# Patient Record
Sex: Female | Born: 1935
Health system: Southern US, Community
[De-identification: ages and names within clinical notes are randomized; demographics above are authoritative.]

## PROBLEM LIST (undated history)

## (undated) DIAGNOSIS — D509 Iron deficiency anemia, unspecified: Secondary | ICD-10-CM

## (undated) DIAGNOSIS — R42 Dizziness and giddiness: Secondary | ICD-10-CM

## (undated) DIAGNOSIS — G629 Polyneuropathy, unspecified: Secondary | ICD-10-CM

## (undated) DIAGNOSIS — E079 Disorder of thyroid, unspecified: Secondary | ICD-10-CM

## (undated) DIAGNOSIS — F418 Other specified anxiety disorders: Secondary | ICD-10-CM

## (undated) DIAGNOSIS — E559 Vitamin D deficiency, unspecified: Secondary | ICD-10-CM

## (undated) DIAGNOSIS — C801 Malignant (primary) neoplasm, unspecified: Secondary | ICD-10-CM

## (undated) DIAGNOSIS — E039 Hypothyroidism, unspecified: Secondary | ICD-10-CM

## (undated) DIAGNOSIS — R413 Other amnesia: Secondary | ICD-10-CM

## (undated) DIAGNOSIS — I456 Pre-excitation syndrome: Secondary | ICD-10-CM

## (undated) DIAGNOSIS — S82009A Unspecified fracture of unspecified patella, initial encounter for closed fracture: Secondary | ICD-10-CM

## (undated) DIAGNOSIS — J349 Unspecified disorder of nose and nasal sinuses: Secondary | ICD-10-CM

## (undated) DIAGNOSIS — N289 Disorder of kidney and ureter, unspecified: Secondary | ICD-10-CM

## (undated) DIAGNOSIS — I7300 Raynaud's syndrome without gangrene: Secondary | ICD-10-CM

## (undated) DIAGNOSIS — I10 Essential (primary) hypertension: Secondary | ICD-10-CM

## (undated) DIAGNOSIS — M1990 Unspecified osteoarthritis, unspecified site: Secondary | ICD-10-CM

## (undated) DIAGNOSIS — B259 Cytomegaloviral disease, unspecified: Secondary | ICD-10-CM

## (undated) DIAGNOSIS — M8580 Other specified disorders of bone density and structure, unspecified site: Secondary | ICD-10-CM

## (undated) DIAGNOSIS — M109 Gout, unspecified: Secondary | ICD-10-CM

## (undated) DIAGNOSIS — Z853 Personal history of malignant neoplasm of breast: Secondary | ICD-10-CM

## (undated) HISTORY — PX: BREAST SURGERY: SHX581

## (undated) HISTORY — PX: ABDOMINAL HYSTERECTOMY: SHX81

## (undated) HISTORY — DX: Iron deficiency anemia, unspecified: D50.9

## (undated) HISTORY — DX: Vitamin D deficiency, unspecified: E55.9

## (undated) HISTORY — DX: Disorder of thyroid, unspecified: E07.9

## (undated) HISTORY — DX: Essential (primary) hypertension: I10

## (undated) HISTORY — DX: Cytomegaloviral disease, unspecified: B25.9

## (undated) HISTORY — PX: SINUS SURGERY WITH INSTATRAK: SHX5215

## (undated) HISTORY — DX: Other specified disorders of bone density and structure, unspecified site: M85.80

## (undated) HISTORY — DX: Personal history of malignant neoplasm of breast: Z85.3

## (undated) HISTORY — DX: Unspecified fracture of unspecified patella, initial encounter for closed fracture: S82.009A

## (undated) HISTORY — DX: Raynaud's syndrome without gangrene: I73.00

## (undated) HISTORY — DX: Polyneuropathy, unspecified: G62.9

## (undated) HISTORY — PX: JOINT REPLACEMENT: SHX530

## (undated) HISTORY — DX: Other specified anxiety disorders: F41.8

## (undated) HISTORY — DX: Unspecified osteoarthritis, unspecified site: M19.90

## (undated) HISTORY — DX: Dizziness and giddiness: R42

## (undated) HISTORY — DX: Other amnesia: R41.3

## (undated) HISTORY — DX: Pre-excitation syndrome: I45.6

## (undated) HISTORY — DX: Gout, unspecified: M10.9

---

## 1986-04-17 DIAGNOSIS — C801 Malignant (primary) neoplasm, unspecified: Secondary | ICD-10-CM

## 1986-04-17 HISTORY — DX: Malignant (primary) neoplasm, unspecified: C80.1

## 1998-02-12 ENCOUNTER — Other Ambulatory Visit: Admission: RE | Admit: 1998-02-12 | Discharge: 1998-02-12 | Payer: Self-pay | Admitting: *Deleted

## 1998-09-08 ENCOUNTER — Ambulatory Visit (HOSPITAL_COMMUNITY): Admission: RE | Admit: 1998-09-08 | Discharge: 1998-09-08 | Payer: Self-pay | Admitting: Obstetrics & Gynecology

## 1998-09-14 ENCOUNTER — Ambulatory Visit (HOSPITAL_COMMUNITY): Admission: RE | Admit: 1998-09-14 | Discharge: 1998-09-14 | Payer: Self-pay | Admitting: *Deleted

## 1998-09-14 ENCOUNTER — Encounter: Payer: Self-pay | Admitting: *Deleted

## 1999-12-14 ENCOUNTER — Encounter: Payer: Self-pay | Admitting: *Deleted

## 1999-12-14 ENCOUNTER — Ambulatory Visit (HOSPITAL_COMMUNITY): Admission: RE | Admit: 1999-12-14 | Discharge: 1999-12-14 | Payer: Self-pay | Admitting: *Deleted

## 2000-01-10 ENCOUNTER — Ambulatory Visit (HOSPITAL_COMMUNITY): Admission: RE | Admit: 2000-01-10 | Discharge: 2000-01-10 | Payer: Self-pay | Admitting: *Deleted

## 2000-10-29 ENCOUNTER — Other Ambulatory Visit: Admission: RE | Admit: 2000-10-29 | Discharge: 2000-10-29 | Payer: Self-pay | Admitting: *Deleted

## 2000-12-21 ENCOUNTER — Encounter: Payer: Self-pay | Admitting: *Deleted

## 2000-12-21 ENCOUNTER — Ambulatory Visit (HOSPITAL_COMMUNITY): Admission: RE | Admit: 2000-12-21 | Discharge: 2000-12-21 | Payer: Self-pay | Admitting: *Deleted

## 2001-08-13 ENCOUNTER — Encounter: Admission: RE | Admit: 2001-08-13 | Discharge: 2001-08-13 | Payer: Self-pay | Admitting: Internal Medicine

## 2001-08-13 ENCOUNTER — Encounter: Payer: Self-pay | Admitting: Internal Medicine

## 2001-12-23 ENCOUNTER — Encounter: Payer: Self-pay | Admitting: *Deleted

## 2001-12-23 ENCOUNTER — Ambulatory Visit (HOSPITAL_COMMUNITY): Admission: RE | Admit: 2001-12-23 | Discharge: 2001-12-23 | Payer: Self-pay | Admitting: *Deleted

## 2002-06-06 ENCOUNTER — Encounter: Admission: RE | Admit: 2002-06-06 | Discharge: 2002-06-18 | Payer: Self-pay | Admitting: Internal Medicine

## 2002-12-25 ENCOUNTER — Encounter: Payer: Self-pay | Admitting: *Deleted

## 2002-12-25 ENCOUNTER — Ambulatory Visit (HOSPITAL_COMMUNITY): Admission: RE | Admit: 2002-12-25 | Discharge: 2002-12-25 | Payer: Self-pay | Admitting: *Deleted

## 2003-07-06 ENCOUNTER — Encounter: Admission: RE | Admit: 2003-07-06 | Discharge: 2003-07-06 | Payer: Self-pay | Admitting: Internal Medicine

## 2003-12-15 ENCOUNTER — Emergency Department (HOSPITAL_COMMUNITY): Admission: EM | Admit: 2003-12-15 | Discharge: 2003-12-15 | Payer: Self-pay | Admitting: Emergency Medicine

## 2004-02-17 ENCOUNTER — Emergency Department (HOSPITAL_COMMUNITY): Admission: EM | Admit: 2004-02-17 | Discharge: 2004-02-17 | Payer: Self-pay | Admitting: Emergency Medicine

## 2004-03-30 ENCOUNTER — Encounter: Admission: RE | Admit: 2004-03-30 | Discharge: 2004-03-30 | Payer: Self-pay | Admitting: Internal Medicine

## 2005-03-01 ENCOUNTER — Encounter: Admission: RE | Admit: 2005-03-01 | Discharge: 2005-03-01 | Payer: Self-pay | Admitting: Orthopedic Surgery

## 2005-03-03 ENCOUNTER — Ambulatory Visit (HOSPITAL_BASED_OUTPATIENT_CLINIC_OR_DEPARTMENT_OTHER): Admission: RE | Admit: 2005-03-03 | Discharge: 2005-03-03 | Payer: Self-pay | Admitting: Orthopedic Surgery

## 2005-03-03 ENCOUNTER — Ambulatory Visit (HOSPITAL_COMMUNITY): Admission: RE | Admit: 2005-03-03 | Discharge: 2005-03-03 | Payer: Self-pay | Admitting: Orthopedic Surgery

## 2005-05-23 ENCOUNTER — Encounter: Admission: RE | Admit: 2005-05-23 | Discharge: 2005-05-23 | Payer: Self-pay | Admitting: Internal Medicine

## 2005-08-22 ENCOUNTER — Ambulatory Visit (HOSPITAL_COMMUNITY): Admission: RE | Admit: 2005-08-22 | Discharge: 2005-08-22 | Payer: Self-pay | Admitting: *Deleted

## 2005-09-18 ENCOUNTER — Encounter: Admission: RE | Admit: 2005-09-18 | Discharge: 2005-09-18 | Payer: Self-pay | Admitting: Internal Medicine

## 2006-05-25 ENCOUNTER — Encounter: Admission: RE | Admit: 2006-05-25 | Discharge: 2006-05-25 | Payer: Self-pay | Admitting: Internal Medicine

## 2006-06-26 ENCOUNTER — Encounter: Admission: RE | Admit: 2006-06-26 | Discharge: 2006-06-26 | Payer: Self-pay | Admitting: Internal Medicine

## 2007-07-04 ENCOUNTER — Encounter: Admission: RE | Admit: 2007-07-04 | Discharge: 2007-07-04 | Payer: Self-pay | Admitting: Internal Medicine

## 2007-12-16 ENCOUNTER — Inpatient Hospital Stay (HOSPITAL_COMMUNITY): Admission: RE | Admit: 2007-12-16 | Discharge: 2007-12-19 | Payer: Self-pay | Admitting: Orthopedic Surgery

## 2008-02-18 ENCOUNTER — Encounter (HOSPITAL_COMMUNITY): Admission: RE | Admit: 2008-02-18 | Discharge: 2008-04-16 | Payer: Self-pay | Admitting: Nephrology

## 2008-07-06 ENCOUNTER — Encounter: Admission: RE | Admit: 2008-07-06 | Discharge: 2008-07-06 | Payer: Self-pay | Admitting: Internal Medicine

## 2009-06-14 ENCOUNTER — Other Ambulatory Visit: Admission: RE | Admit: 2009-06-14 | Discharge: 2009-06-14 | Payer: Self-pay | Admitting: Internal Medicine

## 2009-07-07 ENCOUNTER — Encounter: Admission: RE | Admit: 2009-07-07 | Discharge: 2009-07-07 | Payer: Self-pay | Admitting: Internal Medicine

## 2010-04-01 ENCOUNTER — Encounter (HOSPITAL_COMMUNITY)
Admission: RE | Admit: 2010-04-01 | Discharge: 2010-05-17 | Payer: Self-pay | Source: Home / Self Care | Attending: Nephrology | Admitting: Nephrology

## 2010-04-20 LAB — POCT HEMOGLOBIN-HEMACUE: Hemoglobin: 10.9 g/dL — ABNORMAL LOW (ref 12.0–15.0)

## 2010-05-09 LAB — IRON AND TIBC
Iron: 102 ug/dL (ref 42–135)
Saturation Ratios: 43 % (ref 20–55)
TIBC: 240 ug/dL — ABNORMAL LOW (ref 250–470)
UIBC: 138 ug/dL

## 2010-05-09 LAB — POCT HEMOGLOBIN-HEMACUE: Hemoglobin: 11.8 g/dL — ABNORMAL LOW (ref 12.0–15.0)

## 2010-05-09 LAB — FERRITIN: Ferritin: 936 ng/mL — ABNORMAL HIGH (ref 10–291)

## 2010-05-17 LAB — POCT HEMOGLOBIN-HEMACUE: Hemoglobin: 11.7 g/dL — ABNORMAL LOW (ref 12.0–15.0)

## 2010-05-30 ENCOUNTER — Encounter (HOSPITAL_COMMUNITY): Payer: Self-pay

## 2010-05-31 ENCOUNTER — Encounter (HOSPITAL_COMMUNITY): Payer: Medicare Other | Attending: Nephrology

## 2010-05-31 ENCOUNTER — Other Ambulatory Visit: Payer: Self-pay

## 2010-05-31 ENCOUNTER — Other Ambulatory Visit: Payer: Self-pay | Admitting: Nephrology

## 2010-05-31 DIAGNOSIS — D638 Anemia in other chronic diseases classified elsewhere: Secondary | ICD-10-CM | POA: Insufficient documentation

## 2010-05-31 DIAGNOSIS — N184 Chronic kidney disease, stage 4 (severe): Secondary | ICD-10-CM | POA: Insufficient documentation

## 2010-05-31 LAB — IRON AND TIBC
Iron: 105 ug/dL (ref 42–135)
Saturation Ratios: 46 % (ref 20–55)
TIBC: 229 ug/dL — ABNORMAL LOW (ref 250–470)
UIBC: 124 ug/dL

## 2010-06-01 LAB — POCT HEMOGLOBIN-HEMACUE: Hemoglobin: 12.3 g/dL (ref 12.0–15.0)

## 2010-06-13 ENCOUNTER — Other Ambulatory Visit: Payer: Self-pay | Admitting: Internal Medicine

## 2010-06-13 DIAGNOSIS — Z1231 Encounter for screening mammogram for malignant neoplasm of breast: Secondary | ICD-10-CM

## 2010-06-13 DIAGNOSIS — Z9010 Acquired absence of unspecified breast and nipple: Secondary | ICD-10-CM

## 2010-06-14 ENCOUNTER — Encounter (HOSPITAL_COMMUNITY): Payer: Medicare Other

## 2010-06-16 HISTORY — PX: KIDNEY TRANSPLANT: SHX239

## 2010-07-11 ENCOUNTER — Ambulatory Visit: Payer: Medicare Other

## 2010-07-19 ENCOUNTER — Ambulatory Visit
Admission: RE | Admit: 2010-07-19 | Discharge: 2010-07-19 | Disposition: A | Discharge status: Home / Self Care | Payer: Medicare Other | Source: Ambulatory Visit | Attending: Internal Medicine | Admitting: Internal Medicine

## 2010-07-19 DIAGNOSIS — Z1231 Encounter for screening mammogram for malignant neoplasm of breast: Secondary | ICD-10-CM

## 2010-07-19 DIAGNOSIS — Z9010 Acquired absence of unspecified breast and nipple: Secondary | ICD-10-CM

## 2010-08-30 NOTE — Op Note (Signed)
NAMETYJA, MONTAS              ACCOUNT NO.:  [id number]   MEDICAL RECORD NO.:  [id number]          PATIENT TYPE:  INP   LOCATION:  30                         FACILITY:  [facility]   PHYSICIAN:  Gaynelle Arabian, M.D.    DATE OF BIRTH:  12/06/1931   DATE OF PROCEDURE:  12/16/2007  DATE OF DISCHARGE:                               OPERATIVE REPORT   PREOPERATIVE DIAGNOSIS:  Osteoarthritis left knee.   POSTOPERATIVE DIAGNOSIS:  Osteoarthritis left knee.   PROCEDURE:  Left total knee arthroplasty.   SURGEON:  Gaynelle Arabian, MD.   ASSISTANT:  Lorretta Harp, PA-C.   ANESTHESIA:  Spinal with Duramorph.   ESTIMATED BLOOD LOSS:  Minimal.   DRAIN:  None.   TOURNIQUET TIME:  30 minutes at 300 mmHg.   COMPLICATIONS:  None.   CONDITION:  Stable to recovery.   BRIEF OPERATIVE NOTE:  Ms. Sieker is a 77 year old female with end-  stage arthritis of the left knee with progressively worsening pain and  dysfunction.  She has failed nonoperative management and presents for  total knee arthroplasty.   PROCEDURE IN DETAIL:  After the successful administration of spinal  anesthetic, a tourniquet was placed high on the left thigh and the left  lower extremity was prepped and draped in the usual sterile fashion.  The extremity was wrapped in Esmarch, knee flexed, tourniquet inflated  to 300 mmHg.  A midline incision was made with 10-blade through the  subcutaneous tissue to the level of the extensor mechanism.  A fresh  blade was used to make a medial parapatellar arthrotomy.  The soft  tissue over the proximal medial tibia subperiosteally elevated to joint  line with a knife and semimembranosus bursa with a Cobb elevator.  Soft  tissue laterally was elevated with attention being paid to avoid the  patellar tendon on the tibial tubercle.  Patella subluxed laterally,  knee flexed 90 degrees, ACL and PCL removed.  She had severe erosion of  her lateral femoral condyle with bone-on-bone change in  the lateral  compartment.  A drill was used to create a starting hole in the distal  femur and the canal was thoroughly irrigated.  The 5 degree left valgus  alignment guide is placed and referencing off the posterior condyles  rotations marked and a block pinned to remove 11 mm of the distal femur.  I took 11 because of preop flexion contracture.  A distal femoral  resection is made with an oscillating saw.  A sizing block is placed, a  size 4 was most appropriate in the AP plane and 3 is most appropriate in  the mediolateral plane, thus we chose a 4 narrow for the femoral size.  A size 4 cutting block is placed with the rotation marked at the  epicondylar axis.  The anterior, posterior and chamfer cuts are made.   The tibia subluxed forward and menisci are removed.  Extramedullary  tibial alignment guide is placed referencing proximally at the medial  aspect of the tibial tubercle and distally along the second metatarsal  axis and tibial crest.  Block is pinned to remove  4 mm off the more  deficient lateral side.  Tibial resection is made an oscillating saw.  A  size 3 is the most appropriate tibial component and the proximal tibia  is prepared with a modular drill and keel punch for a size 3.  Femoral  preparation is completed with the intercondylar cut for the size 4.   A size 3 mobile bearing tibial trial, size 4 narrow posterior stabilized  femoral trial, and a 10-mm posterior stabilized rotating platform insert  trial are placed.  With a 10, full extension is achieved with excellent  varus-valgus anterior and posterior balance throughout full range of  motion.  Patella was everted and thickness measured to be 22 mm.  Freehand resection was taken at 13 mm, a 38 template is placed, lug  holes were drilled, trial patella was placed and it tracks normally.  Osteophytes removed off the posterior femur with the trial in place.  All trials are removed and the cut bone surfaces are  prepared with  pulsatile lavage.  Cement is mixed and once we are at implantation a  size 3 mobile bearing tibial tray, size 4 posterior stabilized femur,  and 38 patella are cemented into place and the patella was held with the  clamp.  Trial 10-mm inserts placed, knee held in full extension and all  extruded cement removed.  Once cement was fully hardened then the  permanent 10 mm posterior stabilized rotating platform insert is placed  into the tibial tray.  The wound was copiously irrigated with saline  solution.  FloSeal was injected on the posterior capsule, medial and  lateral gutters and suprapatellar area.  A moist sponge is placed and  tourniquet released for a total time of 30 minutes.  The sponge is held  for 2 minutes then removed.  Minimal bleeding is encountered.  That  which is encountered is stopped with electrocautery.  The wound is again  copiously irrigated with saline solution and the arthrotomy closed with  interrupted #1 PDS.  Flexion against gravity is 135 degrees.  Subcu is  closed with interrupted #2-0 Vicryl and subcuticular running #4-0  Monocryl.  The incision is cleaned and dried and Steri-Strips and a  bulky sterile dressing applied.  She is then placed into a knee  immobilizer, awakened and transported to recovery in stable condition.      Gaynelle Arabian, M.D.  Electronically Signed     FA/MEDQ  D:  12/16/2007  T:  12/16/2007  Job:  [id number]

## 2010-09-02 NOTE — Procedures (Signed)
[place]  Patient:    [name], [name]                       MRN: [id number] Proc. Date: 01/10/00 Adm. Date:  [id number] Attending:  Jim Desanctis                           Procedure Report  PROCEDURE: Colonoscopy.  ENDOSCOPIST: Jim Desanctis, M.D.  INDICATIONS FOR PROCEDURE: Hemoccult positivity.  ANESTHESIA: Demerol 60 mg, Versed 6 mg.  DESCRIPTION OF PROCEDURE: With the patient mildly sedated and in the left lateral decubitus position the Olympus videoscopic variable stiffness pediatric colonoscope was inserted into the rectum and passed under direct vision to the cecum.  The cecum was identified by transillumination of the right lower quadrant, by visualization of the ileocecal valve and appendiceal orifice, both of which were photographed.  From this point the colonoscope was slowly withdrawn.  Taking circumferential views of the entire colonic mucosa, stopping only in the rectum, which appeared normal, and direct view showing internal hemorrhoids on retroflex view.  The endoscope was straightened and withdrawn.  The patients vital signs and pulse oximetry remained stable.  The patient tolerated the procedure well without apparent complications.  FINDINGS: Internal hemorrhoids, otherwise unremarkable colonoscopic examination to the cecum.  PLAN: Repeat examination in five years. DD:  01/10/00 TD:  01/11/00 Job: [id number]

## 2010-09-02 NOTE — Op Note (Signed)
[name], [name]              ACCOUNT NO.:  [id number]   MEDICAL RECORD NO.:  [id number]          PATIENT TYPE:  AMB   LOCATION:  ENDO                         FACILITY:  [facility]   PHYSICIAN:  Waverly Ferrari, M.D.    DATE OF BIRTH:  1931-07-29   DATE OF PROCEDURE:  08/22/2005  DATE OF DISCHARGE:                                 OPERATIVE REPORT   PROCEDURE:  Colonoscopy.   INDICATIONS:  Rectal bleeding.   ANESTHESIA:  Demerol 80, Versed 8 mg.   PROCEDURE:  With the patient mildly sedated in the left lateral decubitus  position the Olympus videoscopic colonoscope was inserted the rectum and  passed under direct vision to the cecum identified by ileocecal valve and  crow's foot of the cecum.  From this point the colonoscope was slowly  withdrawn taking circumferential views of colonic mucosa stopping in the  rectum which appeared normal on direct and showed hemorrhoids on retroflexed  view.  The endoscope was straightened and withdrawn.  The patient's vital  signs, pulse oximeter remained stable.  The patient tolerated procedure well  without apparent complications.   FINDINGS:  Internal hemorrhoids, otherwise unremarkable colonoscopic  examination to the cecum.   PLAN:  Repeat examination in 5-10 years.           ______________________________  Waverly Ferrari, M.D.     GMO/MEDQ  D:  08/22/2005  T:  08/23/2005  Job:  [id number]

## 2010-09-02 NOTE — H&P (Signed)
[name], [name]              ACCOUNT NO.:  [id number]   MEDICAL RECORD NO.:  [id number]          PATIENT TYPE:  INP   LOCATION:                               FAC[facility]   PHYSICIAN:  Gaynelle Arabian, M.D.    DATE OF BIRTH:  1932/04/16   DATE OF ADMISSION:  12/16/2007  DATE OF DISCHARGE:                              HISTORY & PHYSICAL   CHIEF COMPLAINT:  Left knee pain.   HISTORY OF PRESENT ILLNESS:  The patient is a 77 year old female who has  been seen by Dr. Wynelle Link for worsening pain and dysfunction of her knee.  She has had arthroscopy done by Dr. Shellia Carwin 10 years ago and noted to  have arthritis at that time.  She has undergone hylan injections;  despite previous scope and also injections she continued to have pain  and felt to be a good candidate.  Risks and benefits have been  discussed, she elects to proceed with surgery.   ALLERGIES:  KEFLEX.   CURRENT MEDICATIONS:  Tramadol, felodipine, Synthroid, vitamin D,  calcium, iron.   PAST MEDICAL HISTORY:  Vertigo, Wolff-Parkinson-White arrhythmia,  hypertension, hemorrhoids, chronic kidney disease (the patient states  she is functioning about 20%), hypothyroidism, history of anemia, breast  cancer, osteopenia, history of gout, postmenopausal.   PAST SURGICAL HISTORY:  Hysterectomy, right mastectomy for cancer, sinus  surgery.   FAMILY HISTORY:  Father with history of dementia, deceased at 39.  Mother deceased age 39 with cerebral hemorrhage.  Brother with  emphysema.   SOCIAL HISTORY:  Married, retired, nonsmoker, seldom intake of alcohol.  Husband will be assisting with care after surgery.   REVIEW OF SYSTEMS:  GENERAL:  No fevers, chills or night sweats.  NEUROLOGICAL:  No seizures, syncope or paralysis.  RESPIRATORY:  No  shortness of breath, productive cough or hemoptysis.  CARDIOVASCULAR:  No chest pain, PND, orthopnea.  GI:  No nausea, vomiting, diarrhea or  constipation.  GU:  History of chronic renal  disease.  MUSCULOSKELETAL:  Left knee.   PHYSICAL EXAMINATION:  VITAL SIGNS:  Pulse 68, respirations 12, blood  pressure 122/68.  GENERAL:  A 77 year old white female, thin frame, alert, oriented,  cooperative and pleasant, excellent historian, accompanied by her  husband.  HEENT:  Normocephalic, atraumatic.  Pupils round and reactive.  Oropharynx clear.  EOMs intact.  NECK:  Supple.  No bruits.  CHEST:  Clear.  HEART:  Regular rate and rhythm without murmur.  ABDOMEN:  Soft, nontender, flat, bowel sounds present.  RECTAL:  Not done, not pertinent to present illness.  BREASTS:  Not done, not pertinent to present illness.  GENITALIA:  Not done, not pertinent to present illness.  EXTREMITIES:  Left knee slight effusion, marked crepitus, tender more  lateral than medial.   IMPRESSION:  Osteoarthritis left knee.   PLAN:  The patient admitted at [facility] to undergo a left  total knee replacement arthroplasty.  Surgery will be performed by Dr.  Gaynelle Arabian.      Alexzandrew L. Perkins, P.A.C.      Gaynelle Arabian, M.D.  Electronically Signed  ALP/MEDQ  D:  12/15/2007  T:  12/15/2007  Job:  [id number]

## 2010-09-02 NOTE — Discharge Summary (Signed)
NAMEANYIA, LAVALAIS              ACCOUNT NO.:  [id number]   MEDICAL RECORD NO.:  [id number]          PATIENT TYPE:  INP   LOCATION:  11                         FACILITY:  [facility]   PHYSICIAN:  Gaynelle Arabian, M.D.    DATE OF BIRTH:  [birth year]   DATE OF ADMISSION:  12/16/2007  DATE OF DISCHARGE:  12/19/2007                               DISCHARGE SUMMARY   ADMITTING DIAGNOSES:  1. Osteoarthritis left knee.  2. Vertigo.  3. Wolff-Parkinson-White arrhythmia.  4. Hypertension.  5. Hemorrhoids.  6. Chronic kidney disease.  7. Hypothyroidism.  8. History of anemia.  9. Breast cancer.  10.Osteopenia.  11.History of gout.  12.Postmenopausal.   DISCHARGE DIAGNOSES:  1. Osteoarthritis left knee status post left total knee replacement      arthroplasty.  2. Hyperkalemia postoperative, improved.  3. Hyponatremia, mild, improved.  4. Vertigo.  5. Wolff-Parkinson-White arrhythmia.  6. Hypertension.  7. Hemorrhoids.  8. Chronic kidney disease.  9. Hypothyroidism.  10.History of anemia.  11.Breast cancer.  12.Osteopenia.  13.History of gout.  14.Postmenopausal.   PROCEDURE:  December 16, 2007 - left total knee.  Surgeon Dr. Wynelle Link.  Assisted Rutherford Limerick, PA-C.  Anesthesia spinal with Duramorph.   CONSULTATIONS:  None, although renal services were notified of the  patient's admission.   BRIEF HISTORY:  Ms. Burst is a 77 year old female with end-stage  arthritis of left knee, progressive worsening pain and dysfunction, who  failed outpatient management and now presents for a total knee  arthroplasty.   LABORATORY DATA:  Preoperative CBC showed a hemoglobin of 12.4,  hematocrit 37.4, white cell count 6.0, red cell count 4.02, platelets  228.  Postoperative hemoglobin 9.4 drifting down to 9.3 where it  stabilized.  Last noted hemoglobin and hematocrit of 9.3 and 27.4.  PT/PTT on admission 12.9 and 29 respectively.  INR of 1.0.  Serial pro  times followed.  Last noted PT/INR 25.9  and 2.2.  Chem panel on  admission revealed high BUN of 52, creatinine of 2.76 (known renal  disease).  Remaining chem panel within normal limits.  Serial BMETs were  followed.  Potassium went from a normal of 4.1 up to 5.5, back down to  4.6, last noted at 4.8.  Sodium went down a little bit from 136 to 134,  back up to 140.  BUN went up from 52 to 55, back down to 39, lower than  preoperative level,  Creatinine started at 2.76, went up to 3.2, back  down to 2.89.  Preoperative UA - positive protein, moderate leukocyte  esterase, only 3-6 white cells.   X-RAYS:  Chest x-ray on December 09, 2007 revealed no active disease.   HOSPITAL COURSE:  The patient was admitted to [facility] and  tolerated the procedure well.  Later transferred to the recovery room  and the orthopedic floor.  Started on PCA and p.o. analgesic pain  control following surgery.  Home medications were renewed.  Did have a  history of anemia.  Hemoglobin was down to 9.4.  Started on iron.  She  had known renal disease, so creatinine started  high; it did go up a  little bit on postoperative day #1 up to 3.  Potassium was elevated  postoperatively.  Had a normal preoperative level.  Blood pressure was  okay.  Held blood pressure medications for any low pressures. Had decent  output, and we monitored her I&O's very strictly.  Started getting up  out of bed.  By day two, she was doing better.  Potassium had improved.  We did notify renal services postoperatively after her surgery.  By day  two, dressing was changed, incision looked good.  Hemoglobin was down to  9.3 where it stabilized.  BUN and creatinine did go up a little bit  initially but started titrating down, back down closer to preoperative  levels.  Had excellent urinary output by day two.  The incision looked  good.  She was walking well with therapy, up about 80 feet, continued to  improve, and by day three she was tolerating her medications,  hemoglobin  was 93 where it stabilized, and she was discharged home.   DISCHARGE PLAN:  1. The patient was discharged home on December 19, 2007.  2. Discharge diagnoses - please see above.  3. Discharge medications - Vicodin, Coumadin, Robaxin.  4. Diet renal, heart-healthy diet.  5. Follow-up 2 weeks.  6. Activity - weightbearing as tolerated, home health PT, home health      nursing.   DISPOSITION:  Home.   CONDITION ON DISCHARGE:  Improving.      Alexzandrew L. Perkins, P.A.C.      Gaynelle Arabian, M.D.  Electronically Signed    ALP/MEDQ  D:  01/09/2008  T:  01/09/2008  Job:  [id number]

## 2010-09-02 NOTE — Op Note (Signed)
NAMEJANILLE, [name]              ACCOUNT NO.:  [id number]   MEDICAL RECORD NO.:  [id number]          PATIENT TYPE:  AMB   LOCATION:  NESC                         FACILITY:  [facility]   PHYSICIAN:  Tarri Glenn, M.D.  DATE OF BIRTH:  [birth year]   DATE OF PROCEDURE:  03/03/2005  DATE OF DISCHARGE:                                 OPERATIVE REPORT   PREOPERATIVE DIAGNOSIS:  Lateral compartment degenerative arthritis and torn  lateral meniscus, left knee.   POSTOPERATIVE DIAGNOSES:  1.  Lateral compartment degenerative arthritis and torn lateral meniscus,      left knee.  2.  Torn medial meniscus.  3.  Chondromalacia of the patella.   OPERATION:  Left knee arthroscopy with 1) partial medial and lateral  meniscectomies, 2) shaving of the patella.   SURGEON:  Tarri Glenn, M.D.   ASSISTANT:  Nurse.   ANESTHESIA:  General.   PATHOLOGY AND JUSTIFICATION FOR PROCEDURE:  She has had progressive pain  mainly in the lateral aspect of her left knee. Plain x-rays have  demonstrated narrowing of the lateral compartment of the joint. MRI has  demonstrated diagnosis #1. She and her husband understand that today's  surgery is designed to correct the torn lateral meniscus problem but may  very well not correct her complete knee complaints.   DESCRIPTION OF PROCEDURE:  Satisfactory general anesthesia, pneumatic  tourniquet, leg was esmarched out nonsterilely. Thigh stabilizer, leg was  prepped from stabilizer to ankle with DuraPrep, draped in a sterile field.  Ace wrap and knee support to the right leg, superior and medial saline  inflow. First through  an anterior lateral portal, the medial compartment of  the knee joint was evaluated. I found a superficial tear of the medial  meniscus which was pictured and shaved down until smooth with a 3.5 shaver.  The remainder of the medial meniscus appeared to be intact and the joint  surfaces looked close to pristine. I then looked up in the  medial gutter and  suprapatellar area. She had grade 2-3/4 chondromalacia of most of the  patellar articular surface which I shaved down until smooth with a 3.5  shaver. I then reversed portals, ACL was intact. There was a good bit of  synovitis on the lateral joint which I resected to visualize the lateral  meniscus which was torn throughout its entire extent. I handled this by  trimming back a lot of the meniscus with straight and small upbite baskets  and shaving down until smooth with 3.5 shaver until it was a stable rim. The  joint surfaces were ebronated having lost basically all the articular  cartilage. A final picture was taken, knee joint was irrigated until clear  and all fluid possible removed. The two anterior portals were closed with 4-  0 nylon. I injected 20 mL of 0.5% Marcaine with adrenalin and 4 mg of  morphine through the inflow  apparatus which was removed and this portal closed with 4-0 nylon as well. A  Betadine Adaptic dry sterile dressing were applied, tourniquet was released.  She tolerated the procedure well and was taken to  the recovery room in  satisfactory condition with no known complications.           ______________________________  Tarri Glenn, M.D.     JA/MEDQ  D:  03/03/2005  T:  03/03/2005  Job:  [id number]

## 2010-11-30 ENCOUNTER — Encounter (HOSPITAL_COMMUNITY): Payer: Medicare Other | Attending: Nephrology

## 2010-11-30 DIAGNOSIS — Z853 Personal history of malignant neoplasm of breast: Secondary | ICD-10-CM | POA: Insufficient documentation

## 2010-11-30 DIAGNOSIS — E039 Hypothyroidism, unspecified: Secondary | ICD-10-CM | POA: Insufficient documentation

## 2010-11-30 DIAGNOSIS — D709 Neutropenia, unspecified: Secondary | ICD-10-CM | POA: Insufficient documentation

## 2010-11-30 DIAGNOSIS — Z79899 Other long term (current) drug therapy: Secondary | ICD-10-CM | POA: Insufficient documentation

## 2010-11-30 DIAGNOSIS — Z940 Kidney transplant status: Secondary | ICD-10-CM | POA: Insufficient documentation

## 2010-12-01 ENCOUNTER — Encounter (HOSPITAL_COMMUNITY): Payer: Medicare Other

## 2010-12-02 ENCOUNTER — Ambulatory Visit (HOSPITAL_COMMUNITY): Payer: Medicare Other

## 2010-12-06 ENCOUNTER — Encounter (HOSPITAL_COMMUNITY): Payer: Medicare Other

## 2010-12-07 ENCOUNTER — Encounter (HOSPITAL_COMMUNITY): Payer: Medicare Other

## 2010-12-08 ENCOUNTER — Encounter (HOSPITAL_COMMUNITY): Payer: Medicare Other

## 2011-01-18 LAB — BASIC METABOLIC PANEL
BUN: 39 — ABNORMAL HIGH
CO2: 21
CO2: 25
Calcium: 8.3 — ABNORMAL LOW
Chloride: 105
Chloride: 106
Chloride: 107
GFR calc Af Amer: 17 — ABNORMAL LOW
GFR calc Af Amer: 18 — ABNORMAL LOW
GFR calc Af Amer: 18 — ABNORMAL LOW
GFR calc non Af Amer: 15 — ABNORMAL LOW
Glucose, Bld: 108 — ABNORMAL HIGH
Potassium: 4.6
Potassium: 4.8
Potassium: 5.5 — ABNORMAL HIGH
Sodium: 134 — ABNORMAL LOW
Sodium: 137

## 2011-01-18 LAB — CBC
HCT: 27.3 — ABNORMAL LOW
HCT: 27.4 — ABNORMAL LOW
HCT: 28.1 — ABNORMAL LOW
Hemoglobin: 9.3 — ABNORMAL LOW
Hemoglobin: 9.4 — ABNORMAL LOW
MCHC: 33.6
MCHC: 34
MCV: 92.5
MCV: 93.4
Platelets: 146 — ABNORMAL LOW
RBC: 2.95 — ABNORMAL LOW
RBC: 3 — ABNORMAL LOW
WBC: 7.2

## 2011-01-18 LAB — PROTIME-INR: INR: 1.3

## 2011-07-06 ENCOUNTER — Other Ambulatory Visit: Payer: Self-pay | Admitting: Internal Medicine

## 2011-07-06 DIAGNOSIS — Z1231 Encounter for screening mammogram for malignant neoplasm of breast: Secondary | ICD-10-CM

## 2011-07-06 DIAGNOSIS — Z9011 Acquired absence of right breast and nipple: Secondary | ICD-10-CM

## 2011-07-20 ENCOUNTER — Ambulatory Visit
Admission: RE | Admit: 2011-07-20 | Discharge: 2011-07-20 | Disposition: A | Discharge status: Home / Self Care | Payer: Medicare Other | Source: Ambulatory Visit | Attending: Internal Medicine | Admitting: Internal Medicine

## 2011-07-20 DIAGNOSIS — Z9011 Acquired absence of right breast and nipple: Secondary | ICD-10-CM

## 2011-07-20 DIAGNOSIS — Z1231 Encounter for screening mammogram for malignant neoplasm of breast: Secondary | ICD-10-CM

## 2012-01-05 ENCOUNTER — Emergency Department (HOSPITAL_COMMUNITY): Payer: Medicare Other

## 2012-01-05 ENCOUNTER — Encounter (HOSPITAL_COMMUNITY): Payer: Self-pay | Admitting: Emergency Medicine

## 2012-01-05 ENCOUNTER — Emergency Department (HOSPITAL_COMMUNITY)
Admission: EM | Admit: 2012-01-05 | Discharge: 2012-01-05 | Disposition: A | Discharge status: Home / Self Care | Payer: Medicare Other | Attending: Emergency Medicine | Admitting: Emergency Medicine

## 2012-01-05 DIAGNOSIS — M7918 Myalgia, other site: Secondary | ICD-10-CM

## 2012-01-05 DIAGNOSIS — IMO0001 Reserved for inherently not codable concepts without codable children: Secondary | ICD-10-CM | POA: Insufficient documentation

## 2012-01-05 DIAGNOSIS — W1809XA Striking against other object with subsequent fall, initial encounter: Secondary | ICD-10-CM | POA: Insufficient documentation

## 2012-01-05 DIAGNOSIS — Z940 Kidney transplant status: Secondary | ICD-10-CM | POA: Insufficient documentation

## 2012-01-05 DIAGNOSIS — M25559 Pain in unspecified hip: Secondary | ICD-10-CM | POA: Insufficient documentation

## 2012-01-05 DIAGNOSIS — Y92009 Unspecified place in unspecified non-institutional (private) residence as the place of occurrence of the external cause: Secondary | ICD-10-CM | POA: Insufficient documentation

## 2012-01-05 DIAGNOSIS — W19XXXA Unspecified fall, initial encounter: Secondary | ICD-10-CM

## 2012-01-05 DIAGNOSIS — R079 Chest pain, unspecified: Secondary | ICD-10-CM | POA: Insufficient documentation

## 2012-01-05 HISTORY — DX: Disorder of kidney and ureter, unspecified: N28.9

## 2012-01-05 MED ORDER — OXYCODONE-ACETAMINOPHEN 5-325 MG PO TABS: 1.0000 | ORAL_TABLET | ORAL | Status: DC | PRN

## 2012-01-05 MED ORDER — OXYCODONE-ACETAMINOPHEN 5-325 MG PO TABS
1.0000 | ORAL_TABLET | Freq: Once | ORAL | Status: AC
  Administered 2012-01-05: 1 via ORAL
  Filled 2012-01-05: qty 1

## 2012-01-05 MED ORDER — ACETAMINOPHEN 325 MG PO TABS
650.0000 mg | ORAL_TABLET | Freq: Once | ORAL | Status: DC
  Filled 2012-01-05: qty 2

## 2012-01-05 NOTE — ED Provider Notes (Signed)
History     CSN: [id number]  Arrival date & time 01/05/12  1000   First MD Initiated Contact with Patient 01/05/12 1008      Chief Complaint  Patient presents with  . Fall    (Consider location/radiation/quality/duration/timing/severity/associated sxs/prior treatment) HPI  Patient fell early am in bathroom striking back of commode with left flank and hip.  Patient did not strike head and denies loc.  Fall felt secondary to loss of balance or trip and denies light headedness or syncope.  She was able to get up and ambulate with assistance.  She denies head injury, neck pain, chest or abdominal pain.  She complains of pain in left buttocks region.  She does not have any numbness, weakness, or tingling.  She is not on coumadin but takes aspiring.   Past Medical History  Diagnosis Date  . Renal disorder     Past Surgical History  Procedure Date  . Kidney transplant     No family history on file.  History  Substance Use Topics  . Smoking status: Never Smoker   . Smokeless tobacco: Not on file  . Alcohol Use: No    OB History    Grav Para Term Preterm Abortions TAB SAB Ect Mult Living                  Review of Systems  All other systems reviewed and are negative.    Allergies  Review of patient's allergies indicates no known allergies.  Home Medications   Current Outpatient Rx  Name Route Sig Dispense Refill  . ACETAMINOPHEN 325 MG PO TABS Oral Take 650 mg by mouth every 6 (six) hours as needed.    . ALENDRONATE SODIUM 70 MG PO TABS Oral Take 70 mg by mouth every 7 (seven) days. On Wednesdays. Take with a full glass of water on an empty stomach.    . ASPIRIN 81 MG PO CHEW Oral Chew 81 mg by mouth daily.    Marland Kitchen CALCIUM 600 PO Oral Take 1 tablet by mouth 2 (two) times daily.    Marland Kitchen LEVOTHYROXINE SODIUM 50 MCG PO TABS Oral Take 50 mcg by mouth daily.    Marland Kitchen MYCOPHENOLATE SODIUM 180 MG PO TBEC Oral Take 180-360 mg by mouth See admin instructions. Takes 2 tablets every  morning and 1 tablet every evening    . PREDNISONE 5 MG PO TABS Oral Take 5 mg by mouth daily.    . SULFAMETHOXAZOLE-TRIMETHOPRIM 400-80 MG PO TABS Oral Take 1 tablet by mouth See admin instructions. Every Monday Wednesday and Friday    . TACROLIMUS 1 MG PO CAPS Oral Take 1 mg by mouth 2 (two) times daily.    Marland Kitchen VALGANCICLOVIR HCL 450 MG PO TABS Oral Take 450 mg by mouth 2 (two) times daily.    Marland Kitchen VITAMIN D 1000 UNITS PO TABS Oral Take 1,000 Units by mouth 2 (two) times daily.      BP 118/67  Temp 98 F (36.7 C)  Resp 16  Ht 5\' 7"  (1.702 m)  Wt 120 lb (54.432 kg)  BMI 18.79 kg/m2  SpO2 98%  Physical Exam  Vitals reviewed. Constitutional: She appears well-developed and well-nourished.  HENT:  Head: Normocephalic and atraumatic.  Right Ear: External ear normal.  Left Ear: External ear normal.  Mouth/Throat: Oropharynx is clear and moist.  Eyes: Conjunctivae normal are normal. Pupils are equal, round, and reactive to light.  Neck: Normal range of motion. Neck supple.  Cardiovascular: Normal rate,  regular rhythm and normal heart sounds.   Pulmonary/Chest: Effort normal and breath sounds normal. She exhibits tenderness.       Mild ttp left lateral lower ribs, no crepitus  Abdominal: Soft. Bowel sounds are normal.  Musculoskeletal: Normal range of motion. She exhibits no edema.       Some tenderness left buttock and left lateral mid upper leg.   Neurological: She is alert.  Skin: Skin is warm and dry.  Psychiatric: She has a normal mood and affect. Her behavior is normal. Thought content normal.    ED Course  Procedures (including critical care time)  Labs Reviewed - No data to display No results found.   No diagnosis found.    MDM  Patient ambulated with normal gait.  Continues with some pain left buttock.  X-rays without evidence of fracture.  Patient states improved after percocet.  Plan ten percocet rx and patient and husband cautioned regarding risk of fall and sedation  and advised to use acetaminophen as primary pain med.       Shaune Pollack, MD 01/05/12 [phone]

## 2012-01-05 NOTE — ED Notes (Signed)
Per EMS: Pt fell at 5 am this am using restroom. Denies LOC or hitting head. Denies neck/back pain; no pain on spinal palpation. Pain to 8/10 left lower back that dissipates when she doesn't move. Pt reports dizziness prior to EMS arrival, but denies at this time. BP: 130/70, P 64, RR 16.

## 2012-01-05 NOTE — ED Notes (Signed)
Pt states that she took 300 mg of Tylenol at home 1.5 hours ago. EMD made aware. D/c order of Tylenol for now. Pt states that she doesn't want anything for pain at this time. Pt resting comfortable with family at bedside. Will continue to monitor.

## 2012-01-07 ENCOUNTER — Encounter (HOSPITAL_COMMUNITY): Payer: Self-pay

## 2012-06-07 ENCOUNTER — Ambulatory Visit (INDEPENDENT_AMBULATORY_CARE_PROVIDER_SITE_OTHER): Payer: Medicare Other | Admitting: Infectious Diseases

## 2012-06-07 ENCOUNTER — Encounter: Payer: Self-pay | Admitting: Infectious Diseases

## 2012-06-07 VITALS — BP 117/70 | HR 86 | Temp 97.6°F | Ht 66.5 in | Wt 119.0 lb

## 2012-06-07 DIAGNOSIS — E039 Hypothyroidism, unspecified: Secondary | ICD-10-CM | POA: Insufficient documentation

## 2012-06-07 DIAGNOSIS — A319 Mycobacterial infection, unspecified: Secondary | ICD-10-CM

## 2012-06-07 DIAGNOSIS — T8610 Unspecified complication of kidney transplant: Secondary | ICD-10-CM

## 2012-06-07 MED ORDER — DOXYCYCLINE HYCLATE 100 MG PO TABS: 100.0000 mg | ORAL_TABLET | Freq: Two times a day (BID) | ORAL | Status: DC

## 2012-06-07 NOTE — Assessment & Plan Note (Addendum)
She is at high risk for atypical and fungal infections due to her immunosuppresions. Spoke with [facility]- her Cx grew from 05-02-12 sample but the ID of the organism is still pending. Would consider M fortuitum/abscesses/chelonea? Will continue her on anbx while we are awaiting this Cx. Will try doxy to see if this gives her less diarrhea. She may need to be treated with a combination of anbx. Will see her back after the Cx results. I suggested that she could be seen by hand surgery if the lesion is giving her too much discomfort. She defers. She asks about water aerobics, i am fine with her doing this (will keep wound covered).

## 2012-06-07 NOTE — Progress Notes (Signed)
  Subjective:    Patient ID: [name], female    DOB: [birth year], 79 y.o.   MRN: [id number]  HPI 79 yo F with hx of skin bx's and since early January has grown a a large raised lesion on her R forearm. She was started on minocycline for 1 month which she has since completed (2 days ago).  No beach or fish exposures. No plants or roses recently (has some indoor but not recent injuries). She did have a "stick" to her right arm around christmas.  No fever or chills. No LAN.  Had renal txp 2 yrs ago. On MMF/prednisone 5mg  qday/prograf. Etiology unclear. No problems with rejection.  No hx of TB exposures.   Review of Systems  Constitutional: Negative for fever, chills, appetite change and unexpected weight change.  Respiratory: Positive for cough. Negative for shortness of breath.        Non-prod cough  Gastrointestinal: Negative for diarrhea and constipation.  Genitourinary: Negative for dysuria.  Musculoskeletal: Positive for myalgias and arthralgias.  Hematological: Negative for adenopathy.       Objective:   Physical Exam  Constitutional: She appears well-developed and well-nourished.  HENT:  Mouth/Throat: No oropharyngeal exudate.  Eyes: EOM are normal. Pupils are equal, round, and reactive to light.  Neck: Neck supple.  Cardiovascular: Normal rate, regular rhythm and normal heart sounds.   Pulmonary/Chest: Effort normal and breath sounds normal. No respiratory distress.  Abdominal: Soft. Bowel sounds are normal. There is no tenderness. There is no rebound.  Musculoskeletal: She exhibits no edema.  Lymphadenopathy:    She has no cervical adenopathy.  Skin:             Assessment & Plan:

## 2012-06-14 ENCOUNTER — Telehealth: Payer: Self-pay | Admitting: *Deleted

## 2012-06-14 ENCOUNTER — Other Ambulatory Visit: Payer: Self-pay | Admitting: Infectious Diseases

## 2012-06-14 DIAGNOSIS — IMO0002 Reserved for concepts with insufficient information to code with codable children: Secondary | ICD-10-CM

## 2012-06-14 NOTE — Telephone Encounter (Signed)
Dr. Algis Downs notes faxed to Dr. Vanetta Shawl scheduler at [facility] for referral. Landis Gandy, RN

## 2012-06-14 NOTE — Progress Notes (Signed)
Pt with increasing pain and swelling in her arm. Will send her to hand surgery

## 2012-06-17 NOTE — Progress Notes (Signed)
Referral and office notes sent 06/14/12.

## 2012-07-01 ENCOUNTER — Telehealth: Payer: Self-pay

## 2012-07-01 NOTE — Telephone Encounter (Signed)
Pt is calling for culture results.  She is still having problems with her hand. Pain an swelling is worse and she is unsure what to do.   Please advise.   Laverle Patter, RN

## 2012-07-02 ENCOUNTER — Telehealth: Payer: Self-pay | Admitting: *Deleted

## 2012-07-02 NOTE — Telephone Encounter (Signed)
Patient given appointment for tomorrow at 9:45 AM [name]

## 2012-07-02 NOTE — Telephone Encounter (Signed)
Patient calling concerned that lesion on her arm has worsened. Dr. [name] was waiting on culture results from Dr. Ubaldo Glassing which were done in January. Have you found out anything regarding these results, please advise. Myrtis Hopping CMA

## 2012-07-02 NOTE — Telephone Encounter (Signed)
Please have her come see me? tomorrow afternoon? 1:00?

## 2012-07-03 ENCOUNTER — Encounter: Payer: Self-pay | Admitting: Infectious Diseases

## 2012-07-03 ENCOUNTER — Ambulatory Visit (INDEPENDENT_AMBULATORY_CARE_PROVIDER_SITE_OTHER): Payer: Medicare Other | Admitting: Infectious Diseases

## 2012-07-03 ENCOUNTER — Telehealth: Payer: Self-pay | Admitting: *Deleted

## 2012-07-03 ENCOUNTER — Other Ambulatory Visit: Payer: Self-pay | Admitting: Infectious Diseases

## 2012-07-03 VITALS — BP 138/75 | HR 87 | Temp 97.4°F | Ht 66.5 in | Wt 116.0 lb

## 2012-07-03 DIAGNOSIS — A319 Mycobacterial infection, unspecified: Secondary | ICD-10-CM

## 2012-07-03 MED ORDER — AZITHROMYCIN 250 MG PO TABS: ORAL_TABLET | ORAL | Status: DC

## 2012-07-03 MED ORDER — LEVOFLOXACIN 250 MG PO TABS: 250.0000 mg | ORAL_TABLET | Freq: Every day | ORAL | Status: DC

## 2012-07-03 NOTE — Assessment & Plan Note (Addendum)
Still awaiting sensitivities. Would like to avoid IV amikacin (used in more aggressive cases) due to her renal txp. Her isolate is most likely resistant to bactrim given she is taking it for prophylaxis. Her last Cr Cl that I have is 19 (2009). Will get records from Dr Hassell Done.  May need to try IV cefoxitin? Have encouraged her to keep surgical eval If not improving will confer with [facility].  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   Will d/i [facility]  Addendum- CV states she can take rx's provided she has non-prolonged QT currently. Will get ECG.

## 2012-07-03 NOTE — Addendum Note (Signed)
Addended by: Abbegail Matuska C on: 07/03/2012 10:40 AM   Modules accepted: Orders, Medications

## 2012-07-03 NOTE — Progress Notes (Signed)
  Subjective:    Patient ID: DEVYANI SAELEE, female    DOB: 1931-05-15, 79 y.o.   MRN: [id number]  HPI 79 yo F with hx of skin bx's and since early January has grown a a large raised lesion on her R forearm. She was started on minocycline for 1 month and then seen in ID and this was refilled.   Had renal txp 2 yrs ago. On MMF/prednisone 5mg  qday/prograf. Etiology unclear. No problems with rejection.  Since her visit with ID her lesion has become bigger and more painful. She was referred to surgery but her hand had improved and she deffered the appt.  No f/c.  No fever or chills.   Review of Systems No LAN    Objective:   Physical Exam  Constitutional: She appears well-developed and well-nourished.  Cardiovascular: Normal rate, regular rhythm and normal heart sounds.   Pulmonary/Chest: Effort normal and breath sounds normal.  Abdominal: Soft. Bowel sounds are normal. There is no tenderness.  Musculoskeletal:       Arms:         Assessment & Plan:

## 2012-07-03 NOTE — Telephone Encounter (Signed)
Patient had EKG at PCP office in 2012. They are going to work her in to do another EKG and she will have their office fax this clinic the results. Myrtis Hopping CMA

## 2012-07-03 NOTE — Telephone Encounter (Signed)
Great, thanks

## 2012-07-04 ENCOUNTER — Other Ambulatory Visit: Payer: Self-pay | Admitting: Infectious Diseases

## 2012-07-04 ENCOUNTER — Telehealth: Payer: Self-pay | Admitting: *Deleted

## 2012-07-04 DIAGNOSIS — A319 Mycobacterial infection, unspecified: Secondary | ICD-10-CM

## 2012-07-04 MED ORDER — LEVOFLOXACIN 500 MG PO TABS: 500.0000 mg | ORAL_TABLET | Freq: Every day | ORAL | Status: AC

## 2012-07-04 MED ORDER — AZITHROMYCIN 250 MG PO TABS: 250.0000 mg | ORAL_TABLET | Freq: Every day | ORAL | Status: DC

## 2012-07-04 NOTE — Telephone Encounter (Signed)
Per Dr. [name] he reviewed her EKG and it looked ok, so 2 antibiotics were prescribed and [facility]. Patient notified Myrtis Hopping

## 2012-07-04 NOTE — Assessment & Plan Note (Signed)
Pt got ECG at PCP- her QT is 390 (normal) and QTc is 435 (.45-.47 in women). Note from CV that ok to give her listed anbx as long as her QT is normal. Discussed with renal as well.  Will procede with azithro and levaquin

## 2012-07-10 ENCOUNTER — Telehealth: Payer: Self-pay | Admitting: *Deleted

## 2012-07-10 NOTE — Telephone Encounter (Signed)
Called patient and notified of appt with [facility], Dr. Amedeo Plenty, for 07/15/12 at 2:30 pm. Myrtis Hopping

## 2012-08-05 ENCOUNTER — Encounter: Payer: Self-pay | Admitting: Infectious Diseases

## 2012-08-06 DIAGNOSIS — I10 Essential (primary) hypertension: Secondary | ICD-10-CM | POA: Insufficient documentation

## 2012-08-06 DIAGNOSIS — Z940 Kidney transplant status: Secondary | ICD-10-CM | POA: Insufficient documentation

## 2012-08-09 ENCOUNTER — Other Ambulatory Visit: Payer: Self-pay | Admitting: Licensed Clinical Social Worker

## 2012-08-14 ENCOUNTER — Ambulatory Visit: Payer: Medicare Other | Admitting: Infectious Diseases

## 2012-08-17 LAB — AFB CULTURE WITH SMEAR (NOT AT ARMC)

## 2012-08-28 ENCOUNTER — Ambulatory Visit: Payer: Medicare Other | Admitting: Infectious Diseases

## 2012-08-28 ENCOUNTER — Other Ambulatory Visit: Payer: Self-pay

## 2012-08-28 DIAGNOSIS — Z9011 Acquired absence of right breast and nipple: Secondary | ICD-10-CM

## 2012-08-28 DIAGNOSIS — Z1231 Encounter for screening mammogram for malignant neoplasm of breast: Secondary | ICD-10-CM

## 2012-09-11 ENCOUNTER — Encounter: Payer: Self-pay | Admitting: Infectious Diseases

## 2012-09-11 ENCOUNTER — Ambulatory Visit (INDEPENDENT_AMBULATORY_CARE_PROVIDER_SITE_OTHER): Payer: Medicare Other | Admitting: Infectious Diseases

## 2012-09-11 VITALS — BP 125/69 | HR 102 | Temp 98.3°F | Ht 66.5 in | Wt 119.0 lb

## 2012-09-11 DIAGNOSIS — A319 Mycobacterial infection, unspecified: Secondary | ICD-10-CM

## 2012-09-11 MED ORDER — AZITHROMYCIN 250 MG PO TABS: 250.0000 mg | ORAL_TABLET | Freq: Every day | ORAL | Status: DC

## 2012-09-11 NOTE — Assessment & Plan Note (Addendum)
She is doing very well. Will give her 1 more month of anbx to complete 12 weeks. We spoke about the etiology of this lesion- whether from the procedure of if the procedure opened a source for this ubiquitous organism to get into her skin. She will f/u with her renal physician as well as her dermatologist. Suspect she will go back on her MMF after she completes her anbx.

## 2012-09-11 NOTE — Progress Notes (Signed)
  Subjective:    Patient ID: [name], female    DOB: November 15, 1931, 79 y.o.   MRN: [id number]  HPI 79 yo F with hx of renal txp 2 yrs ago. On MMF/prednisone 5mg  qday/prograf. Has been off valcyte and mifortic more recently.  She had skin bx's and since early January 2014 has grown a a large raised lesion on her R forearm. She was started on minocycline for 1 month however lesion persisted.  She was started on azithro and levaquin and her lesion has improved. Cx- M chelonea/abscessus (Sens- Amikacin, Tobramycin, Cefoxitin, Tigecycline, clarithro, Azithro, Bactrim, linezolid, clofazimine. I- Augmentin, Tobra, Imipenem, Cipro. R- moxi, doxy).  Has had some skin darkening due to mino (per Dr Ubaldo Glassing). Has been seen by Dr Amedeo Plenty as well.   No problems with anbx, ran out of azithro last week.  Her wound has closed.    Review of Systems     Objective:   Physical Exam  Constitutional: She appears well-developed and well-nourished.  Musculoskeletal:       Arms:         Assessment & Plan:

## 2012-09-18 ENCOUNTER — Ambulatory Visit: Payer: Medicare Other

## 2012-09-19 ENCOUNTER — Ambulatory Visit
Admission: RE | Admit: 2012-09-19 | Discharge: 2012-09-19 | Disposition: A | Discharge status: Home / Self Care | Payer: Medicare Other | Source: Ambulatory Visit

## 2012-09-19 DIAGNOSIS — Z9011 Acquired absence of right breast and nipple: Secondary | ICD-10-CM

## 2012-09-19 DIAGNOSIS — Z1231 Encounter for screening mammogram for malignant neoplasm of breast: Secondary | ICD-10-CM

## 2012-11-11 ENCOUNTER — Telehealth: Payer: Self-pay | Admitting: Infectious Diseases

## 2012-11-11 ENCOUNTER — Other Ambulatory Visit: Payer: Self-pay | Admitting: Infectious Diseases

## 2012-11-11 NOTE — Telephone Encounter (Signed)
Spoke with [name], she is doing well. She still has some residual skin abn. She has been off anbx for 1 month. I asked her to f/u with renal as already scheduled next month. If they feel that she has persistent skin abn, I would be more than happy to see her back. If not, expect she can go back on her previous immunosuppresants (myfortic).

## 2012-11-18 ENCOUNTER — Telehealth: Payer: Self-pay | Admitting: *Deleted

## 2012-11-18 ENCOUNTER — Other Ambulatory Visit: Payer: Self-pay | Admitting: *Deleted

## 2012-11-18 NOTE — Telephone Encounter (Signed)
Called patient and tried to work out a schedule she only wants to see Dr [name] who is booked until 10-14 advised her will have to speak with him to see if ok to overbook his schedule and give her a call back (around 12/02/12). Advised [facility]

## 2012-11-18 NOTE — Telephone Encounter (Signed)
Refill her previous rx's and have her come in for visit when she is in town.  thanks

## 2012-11-18 NOTE — Telephone Encounter (Signed)
Patient called in today to advise that the infection that Dr [name] treated her for appears to be back. She acknowledges when she spoke to Dr [name] last week she was fine just has a few blistery spots but they have since started to look like the spots she had when she was infected. She is very anxious and wants to start treatment as soon as possible. She advised she is on vacation at [facility] but wanted to know if he wanted to call her in an antibiotic or if he will need to see her before he can treat her. Advised her will send Dr [name] a message and call her back once he gives me an answer. Advised if he wants to call her in an antibiotic to [facility] [place].

## 2012-11-19 ENCOUNTER — Other Ambulatory Visit: Payer: Self-pay | Admitting: *Deleted

## 2012-11-19 DIAGNOSIS — A319 Mycobacterial infection, unspecified: Secondary | ICD-10-CM

## 2012-11-19 MED ORDER — LEVOFLOXACIN 500 MG PO TABS: 500.0000 mg | ORAL_TABLET | Freq: Every day | ORAL | Status: DC

## 2012-11-19 MED ORDER — AZITHROMYCIN 250 MG PO TABS: 250.0000 mg | ORAL_TABLET | Freq: Every day | ORAL | Status: DC

## 2012-11-19 NOTE — Telephone Encounter (Signed)
Refill azithro, levaquin rx's

## 2013-01-15 ENCOUNTER — Other Ambulatory Visit: Payer: Self-pay | Admitting: *Deleted

## 2013-01-15 ENCOUNTER — Ambulatory Visit (INDEPENDENT_AMBULATORY_CARE_PROVIDER_SITE_OTHER): Payer: Medicare Other | Admitting: Infectious Diseases

## 2013-01-15 ENCOUNTER — Encounter: Payer: Self-pay | Admitting: Infectious Diseases

## 2013-01-15 VITALS — BP 134/73 | HR 74 | Temp 98.0°F | Ht 66.5 in | Wt 112.0 lb

## 2013-01-15 DIAGNOSIS — A319 Mycobacterial infection, unspecified: Secondary | ICD-10-CM

## 2013-01-15 MED ORDER — AZITHROMYCIN 250 MG PO TABS: 250.0000 mg | ORAL_TABLET | Freq: Every day | ORAL | Status: DC

## 2013-01-15 MED ORDER — LEVOFLOXACIN 500 MG PO TABS: 500.0000 mg | ORAL_TABLET | Freq: Every day | ORAL | Status: DC

## 2013-01-15 NOTE — Telephone Encounter (Signed)
[facility] [address]  Notified pt.

## 2013-01-15 NOTE — Assessment & Plan Note (Signed)
Her lesion is much smaller than previous, hopefully have caught this early. Will restart her levaquin and azithro. Will see her back in 2 months to assess her tolerance and her progress.

## 2013-01-15 NOTE — Progress Notes (Signed)
  Subjective:    Patient ID: [name], female    DOB: 01-02-1932, 79 y.o.   MRN: [id number]  HPI 79 yo F with hx of renal txp 2012 and WPW. On prograf/prednisone 5mg  qday/prograf.  She had skin bx's and since early January 2014 has grown a a large raised lesion on her R forearm. She was started on minocycline for 1 month however lesion persisted. She was started on azithro and levaquin and her lesion has improved. Cx- M chelonea/abscessus (Sens- Amikacin, Tobramycin, Cefoxitin, Tigecycline, clarithro, Azithro, Bactrim, linezolid, clofazimine. I- Augmentin, Tobra, Imipenem, Cipro. R- moxi, doxy).  Has had some skin darkening due to mino (per Dr Ubaldo Glassing). Has been seen by Dr Amedeo Plenty as well.  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  Now returns with return of worsening of raised lesion on her R wrist. Has not drained any fluid.  Developed pain this summer in same place. Off myfortic and valcyte since this summer.   Review of Systems No f/c. No LAN. No change in her function of her arm.     Objective:   Physical Exam  Constitutional: She appears well-developed and well-nourished.  Skin:             Assessment & Plan:

## 2013-03-19 ENCOUNTER — Encounter: Payer: Self-pay | Admitting: Infectious Diseases

## 2013-03-19 ENCOUNTER — Ambulatory Visit (INDEPENDENT_AMBULATORY_CARE_PROVIDER_SITE_OTHER): Payer: Medicare Other | Admitting: Infectious Diseases

## 2013-03-19 VITALS — BP 131/80 | HR 66 | Temp 97.8°F | Ht 66.5 in | Wt 123.0 lb

## 2013-03-19 DIAGNOSIS — A319 Mycobacterial infection, unspecified: Secondary | ICD-10-CM

## 2013-03-19 DIAGNOSIS — T8610 Unspecified complication of kidney transplant: Secondary | ICD-10-CM

## 2013-03-19 NOTE — Progress Notes (Signed)
   Subjective:    Patient ID: [name], female    DOB: 1932/04/02, 79 y.o.   MRN: [id number]  HPI 79 yo F with hx of renal txp 2012 and WPW. On prograf/prednisone 5mg  qday.  She had skin bx's and since early January 2014 has grew a large raised lesion on her R forearm. She was started on minocycline for 1 month however lesion persisted. She was started on azithro and levaquin and her lesion improved. Cx- M chelonea/abscessus (Sens- Amikacin, Tobramycin, Cefoxitin, Tigecycline, clarithro, Azithro, Bactrim, linezolid, clofazimine. I- Augmentin, Tobra, Imipenem, Cipro. R- moxi, doxy).  Has had some skin darkening due to mino (per Dr Ubaldo Glassing). Has been seen by Dr Amedeo Plenty as well.  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  She returned with return of worsening of raised lesion on her R wrist in October 2014. Her lesion is improved. Denies f/c. Has few small scabbed areas on her R wrist.  Has some occas diarhea.  has been off myfortic, will restart when she finishes anbx.    Review of Systems     Objective:   Physical Exam  Constitutional: She appears well-developed and well-nourished.  Musculoskeletal:       Arms:         Assessment & Plan:

## 2013-03-19 NOTE — Assessment & Plan Note (Signed)
She appears to be doing well. I have re-querried [facility] as well as [facility] and have not heard back from them. Previously had been recommended to have her seen by surgery, currently there is nothing there to resect.  Will see her back in 4 months at the anticipated completion of her anbx.

## 2013-04-24 ENCOUNTER — Telehealth: Payer: Self-pay | Admitting: *Deleted

## 2013-04-24 NOTE — Telephone Encounter (Signed)
Patient called to advised that for the past 2-3 weeks she had been having increased muscle and joint pain and stiffness. She advised she was reading and noticed that with her history of kidney transplant, prednisone use and age she should call the doctor if muscle aches occur. She asked if she should continue to take the meds or change to something different. Advised her will have to ask Dr [name] and give he a call back. She advised she will hold the medication until she hears back as she is afraid of what might be happening to her. Advised her not to worry and that I or someone will call her asap with an answer.

## 2013-04-25 ENCOUNTER — Encounter: Payer: Self-pay | Admitting: Infectious Diseases

## 2013-04-25 ENCOUNTER — Other Ambulatory Visit: Payer: Self-pay | Admitting: Nephrology

## 2013-04-25 NOTE — Telephone Encounter (Signed)
Agree with stopping levaquin. Have her see primary transplant/kidney doctor.  Needs appt with me

## 2013-04-25 NOTE — Telephone Encounter (Signed)
Called patient and had to leave a message to call the office to advise her of Dr [name] recommendations.

## 2013-05-05 ENCOUNTER — Encounter: Payer: Self-pay | Admitting: Infectious Diseases

## 2013-05-05 ENCOUNTER — Ambulatory Visit (INDEPENDENT_AMBULATORY_CARE_PROVIDER_SITE_OTHER): Payer: Medicare Other | Admitting: Infectious Diseases

## 2013-05-05 VITALS — BP 155/84 | HR 74 | Temp 97.5°F | Ht 66.5 in | Wt 123.0 lb

## 2013-05-05 DIAGNOSIS — A319 Mycobacterial infection, unspecified: Secondary | ICD-10-CM

## 2013-05-05 NOTE — Assessment & Plan Note (Signed)
She is doing well. She has stopped the levaquin due to myalgias. Will plan on her completing 6 months of azithro, (and bactrim which she is on for prophylaxis). Will re-evaluate her in 2 months, at the end of 6 months.  The previous recommendation was to resect her lesions. Currently, I do not believe that her lesions are resectable due to their small size.  I have been working with our IRB and the FDA to get Clofazamine for her. If she has worsening at her f/u in 2 months, will start her on imipenem, clofazamine, azithro.

## 2013-05-05 NOTE — Progress Notes (Signed)
   Subjective:    Patient ID: [name], female    DOB: May 02, 1931, 80 y.o.   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 On prograf/prednisone 5mg  qday.  She had skin bx's and since early January 2014 has grew a large raised lesion on her R forearm. She was started on minocycline for 1 month however lesion persisted. She was started on azithro and levaquin and her lesion improved. Cx- M chelonea/abscessus (Sens- Amikacin, Tobramycin, Cefoxitin, Tigecycline, clarithro, Azithro, Bactrim, linezolid, clofazimine. I- Augmentin, Tobra, Imipenem, Cipro. R- moxi, doxy).  Has had some skin darkening due to mino (per Dr Ubaldo Glassing). Has been seen by Dr Amedeo Plenty as well.  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  She returned with return of worsening of raised lesion on her R wrist in October 2014.  She returned Mar 19, 2013 and was doing well.  By 04-24-13 she had stopped levaquin due to myalgias. Still has some occas myalgias.     Review of Systems     Objective:   Physical Exam  Constitutional: She appears well-developed and well-nourished.  Musculoskeletal:       Arms:         Assessment & Plan:

## 2013-05-14 ENCOUNTER — Other Ambulatory Visit: Payer: Self-pay | Admitting: Nephrology

## 2013-05-14 DIAGNOSIS — Z940 Kidney transplant status: Secondary | ICD-10-CM

## 2013-05-20 ENCOUNTER — Ambulatory Visit
Admission: RE | Admit: 2013-05-20 | Discharge: 2013-05-20 | Disposition: A | Discharge status: Home / Self Care | Payer: Medicare Other | Source: Ambulatory Visit | Attending: Nephrology | Admitting: Nephrology

## 2013-05-20 DIAGNOSIS — Z940 Kidney transplant status: Secondary | ICD-10-CM

## 2013-07-09 ENCOUNTER — Encounter: Payer: Self-pay | Admitting: Infectious Diseases

## 2013-07-09 ENCOUNTER — Ambulatory Visit (INDEPENDENT_AMBULATORY_CARE_PROVIDER_SITE_OTHER): Payer: Medicare Other | Admitting: Infectious Diseases

## 2013-07-09 VITALS — BP 132/82 | HR 74 | Temp 97.7°F | Ht 66.0 in | Wt 124.0 lb

## 2013-07-09 DIAGNOSIS — A319 Mycobacterial infection, unspecified: Secondary | ICD-10-CM

## 2013-07-09 NOTE — Assessment & Plan Note (Signed)
She will complete the 6 months of her treatment at the end of this month. I have asked her to call me if she has any further issues with her wrist. I am hopeful that we can avoid giving her an IV regimen in the future (which she does not want).

## 2013-07-09 NOTE — Progress Notes (Signed)
   Subjective:    Patient ID: LEAHMARIE LOJEWSKI, female    DOB: [birth year], 80 y.o.   MRN: [id number]  HPI 80 yo F with hx of renal txp 2012 and WPW. On prograf/prednisone 5mg  qday.  She had skin bx's and since early January 2014 has grew a large raised lesion on her R forearm. She was started on minocycline for 1 month however lesion persisted. She was started on azithro and levaquin and her lesion improved. Cx- M chelonea/abscessus (Sens- Amikacin, Tobramycin, Cefoxitin, Tigecycline, clarithro, Azithro, Bactrim, linezolid, clofazimine. I- Augmentin, Tobra, Imipenem, Cipro. R- moxi, doxy).  Has had some skin darkening due to mino (per Dr Ubaldo Glassing). Has been seen by Dr Amedeo Plenty as well.  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  She returned with return of worsening of raised lesion on her R wrist in October 2014.  She returned Mar 19, 2013 and was doing well.  By 04-24-13 she had stopped levaquin due to myalgias.  At her previous f/u, plan was to complete 6 months of azithro, (and bactrim which she is on for prophylaxis).  She returns today, the end of 6 months will be at end of this month.  Recently had in office hydration due to sinus infection, illness.    Review of Systems     Objective:   Physical Exam  Constitutional: She appears well-developed and well-nourished.  Musculoskeletal:       Arms:         Assessment & Plan:

## 2013-07-16 ENCOUNTER — Emergency Department (HOSPITAL_BASED_OUTPATIENT_CLINIC_OR_DEPARTMENT_OTHER)
Admission: EM | Admit: 2013-07-16 | Discharge: 2013-07-16 | Disposition: A | Discharge status: Home / Self Care | Payer: Medicare Other | Attending: Emergency Medicine | Admitting: Emergency Medicine

## 2013-07-16 ENCOUNTER — Encounter (HOSPITAL_BASED_OUTPATIENT_CLINIC_OR_DEPARTMENT_OTHER): Payer: Self-pay | Admitting: Emergency Medicine

## 2013-07-16 ENCOUNTER — Emergency Department (HOSPITAL_BASED_OUTPATIENT_CLINIC_OR_DEPARTMENT_OTHER): Payer: Medicare Other

## 2013-07-16 DIAGNOSIS — S6990XA Unspecified injury of unspecified wrist, hand and finger(s), initial encounter: Secondary | ICD-10-CM | POA: Insufficient documentation

## 2013-07-16 DIAGNOSIS — IMO0002 Reserved for concepts with insufficient information to code with codable children: Secondary | ICD-10-CM | POA: Insufficient documentation

## 2013-07-16 DIAGNOSIS — S59919A Unspecified injury of unspecified forearm, initial encounter: Secondary | ICD-10-CM

## 2013-07-16 DIAGNOSIS — S79919A Unspecified injury of unspecified hip, initial encounter: Secondary | ICD-10-CM | POA: Insufficient documentation

## 2013-07-16 DIAGNOSIS — W010XXA Fall on same level from slipping, tripping and stumbling without subsequent striking against object, initial encounter: Secondary | ICD-10-CM | POA: Insufficient documentation

## 2013-07-16 DIAGNOSIS — Z79899 Other long term (current) drug therapy: Secondary | ICD-10-CM | POA: Insufficient documentation

## 2013-07-16 DIAGNOSIS — S59909A Unspecified injury of unspecified elbow, initial encounter: Secondary | ICD-10-CM | POA: Insufficient documentation

## 2013-07-16 DIAGNOSIS — S82009A Unspecified fracture of unspecified patella, initial encounter for closed fracture: Secondary | ICD-10-CM | POA: Insufficient documentation

## 2013-07-16 DIAGNOSIS — Y9289 Other specified places as the place of occurrence of the external cause: Secondary | ICD-10-CM | POA: Insufficient documentation

## 2013-07-16 DIAGNOSIS — Z87448 Personal history of other diseases of urinary system: Secondary | ICD-10-CM | POA: Insufficient documentation

## 2013-07-16 DIAGNOSIS — E079 Disorder of thyroid, unspecified: Secondary | ICD-10-CM | POA: Insufficient documentation

## 2013-07-16 DIAGNOSIS — S82091A Other fracture of right patella, initial encounter for closed fracture: Secondary | ICD-10-CM

## 2013-07-16 DIAGNOSIS — Z792 Long term (current) use of antibiotics: Secondary | ICD-10-CM | POA: Insufficient documentation

## 2013-07-16 DIAGNOSIS — Y939 Activity, unspecified: Secondary | ICD-10-CM | POA: Insufficient documentation

## 2013-07-16 DIAGNOSIS — S79929A Unspecified injury of unspecified thigh, initial encounter: Secondary | ICD-10-CM

## 2013-07-16 MED ORDER — HYDROCODONE-ACETAMINOPHEN 5-325 MG PO TABS
1.0000 | ORAL_TABLET | Freq: Once | ORAL | Status: AC
  Administered 2013-07-16: 1 via ORAL
  Filled 2013-07-16: qty 1

## 2013-07-16 MED ORDER — HYDROCODONE-ACETAMINOPHEN 5-325 MG PO TABS: 1.0000 | ORAL_TABLET | Freq: Four times a day (QID) | ORAL | Status: DC | PRN

## 2013-07-16 NOTE — ED Notes (Signed)
Pt sts she tripped and fell yesterday outside [employer] and injured right knee, left wrist and left hip.

## 2013-07-16 NOTE — ED Notes (Signed)
Pt ambulatory in hallway with walker.

## 2013-07-16 NOTE — ED Provider Notes (Signed)
CSN: [id number]     Arrival date & time 07/16/13  0849 History   First MD Initiated Contact with Patient 07/16/13 [phone]     Chief Complaint  Patient presents with  . Knee Injury     (Consider location/radiation/quality/duration/timing/severity/associated sxs/prior Treatment) HPI Pt with history of renal transplant reports she stumbled and fell yesterday landing on her R knee and injuring her L wrist. She is complaining of worsening pain and swelling in the R knee, unable to stand or walk. Also has mild pain in L wrist and L hip, worse with movement. Denies blood thinners. No head injury or LOC.  Past Medical History  Diagnosis Date  . Renal disorder   . Thyroid disease    Past Surgical History  Procedure Laterality Date  . Kidney transplant    . Joint replacement     Family History  Problem Relation Age of Onset  . Dementia Father   . Dementia Sister   . Cancer Brother     lung   History  Substance Use Topics  . Smoking status: Never Smoker   . Smokeless tobacco: Never Used  . Alcohol Use: No   OB History   Grav Para Term Preterm Abortions TAB SAB Ect Mult Living                 Review of Systems  All other systems reviewed and are negative except as noted in HPI.    Allergies  Minocycline  Home Medications   Current Outpatient Rx  Name  Route  Sig  Dispense  Refill  . acetaminophen (TYLENOL) 325 MG tablet   Oral   Take 650 mg by mouth every 6 (six) hours as needed.         Marland Kitchen alendronate (FOSAMAX) 70 MG tablet   Oral   Take 70 mg by mouth every 7 (seven) days. On Wednesdays. Take with a full glass of water on an empty stomach.         Marland Kitchen azithromycin (ZITHROMAX) 250 MG tablet   Oral   Take 1 tablet (250 mg total) by mouth daily.   30 tablet   1   . Calcium Carbonate (CALCIUM 600 PO)   Oral   Take 1 tablet by mouth 2 (two) times daily.         . cholecalciferol (VITAMIN D) 1000 UNITS tablet   Oral   Take 1,000 Units by mouth 2 (two) times  daily.         Marland Kitchen levothyroxine (SYNTHROID, LEVOTHROID) 50 MCG tablet   Oral   Take 50 mcg by mouth daily.         . predniSONE (DELTASONE) 5 MG tablet   Oral   Take 5 mg by mouth daily.         Marland Kitchen sulfamethoxazole-trimethoprim (BACTRIM,SEPTRA) 400-80 MG per tablet   Oral   Take 1 tablet by mouth See admin instructions. Every Monday Wednesday and Friday         . tacrolimus (PROGRAF) 1 MG capsule   Oral   Take 1 mg by mouth 2 (two) times daily.          BP 153/80  Pulse 77  Temp(Src) 98.3 F (36.8 C) (Oral)  Resp 18  Ht 5\' 6"  (1.676 m)  Wt 120 lb (54.432 kg)  BMI 19.38 kg/m2  SpO2 99% Physical Exam  Nursing note and vitals reviewed. Constitutional: She is oriented to person, place, and time. She appears well-developed and well-nourished.  HENT:  Head: Normocephalic and atraumatic.  Eyes: EOM are normal. Pupils are equal, round, and reactive to light.  Neck: Normal range of motion. Neck supple.  Cardiovascular: Normal rate, normal heart sounds and intact distal pulses.   Pulmonary/Chest: Effort normal and breath sounds normal.  Abdominal: Bowel sounds are normal. She exhibits no distension. There is no tenderness.  Musculoskeletal: She exhibits tenderness. She exhibits no edema.  Ecchymosis, tenderness, swelling to L knee, worse with palpation, difficulty to ROM due to pain, moderate to severe joint effusion; there is erythema and tenderness to L radial wrist as well worse with ROM, but minimal swelling and no deformity  Neurological: She is alert and oriented to person, place, and time. She has normal strength. No cranial nerve deficit or sensory deficit.  Skin: Skin is warm and dry. No rash noted.  Psychiatric: She has a normal mood and affect.    ED Course  Procedures (including critical care time) Labs Review Labs Reviewed - No data to display Imaging Review Dg Wrist Complete Left  07/16/2013   CLINICAL DATA:  [place] yesterday.  Left wrist pain.  EXAM: LEFT  WRIST - COMPLETE 3+ VIEW  COMPARISON:  None.  FINDINGS: No fracture. The wrist joints are normally space and aligned. Bones are demineralized. Soft tissues are unremarkable.  IMPRESSION: The fracture or dislocation.   Electronically Signed   By: Lajean Manes M.D.   On: 07/16/2013 09:50   Dg Hip Complete Left  07/16/2013   CLINICAL DATA:  [place] yesterday.  Left lateral hip pain.  EXAM: LEFT HIP - COMPLETE 2+ VIEW  COMPARISON:  01/05/2012  FINDINGS: There is no evidence of hip fracture or dislocation. There is no evidence of arthropathy or other focal bone abnormality.  IMPRESSION: Negative.   Electronically Signed   By: Lajean Manes M.D.   On: 07/16/2013 09:51   Dg Knee Complete 4 Views Right  07/16/2013   CLINICAL DATA:  Pain post trauma  EXAM: RIGHT KNEE - COMPLETE 4+ VIEW  COMPARISON:  None.  FINDINGS: Frontal, lateral, and bilateral oblique views were obtained. There is a comminuted patellar fracture with displaced fragments in the mid patellar region. There is a large joint effusion. There are no other fractures. No dislocation. There is moderate narrowing laterally. There is extensive arterial vascular calcification.  IMPRESSION: Comminuted fracture of the patella with large joint effusion. Osteoarthritic change, most notably laterally.   Electronically Signed   By: Lowella Grip M.D.   On: 07/16/2013 09:44     EKG Interpretation None      MDM   Final diagnoses:  Patellar sleeve fracture of right knee    Imaging results reviewed and images discussed with patient and husband. Knee immobilizer placed. She is able to walk with her walker using toe touch. She has previously been seen by Dr.Aluisio. Advised followup for further eval.     Guy Toney B. Karle Starch, MD 07/16/13 1041

## 2013-07-16 NOTE — Discharge Instructions (Signed)
Patellar Fracture, Adult A patellar fracture is a break in your kneecap (patella).  CAUSES   A direct blow to the knee or a fall is usually the cause of a broken patella.  A very hard and strong bending of your knee can cause a patellar fracture. RISK FACTORS Involvement in contact sports, especially sports that involve a lot of jumping. SIGNS AND SYMPTOMS   Tender and swollen knee.  Pain when you move your knee, especially when you try to straighten out your leg.  Difficulty walking or putting weight on your knee.  Misshapen knee (as if a bone is out of place). DIAGNOSIS  Patellar fracture is usually diagnosed with a physical exam and an X-ray exam. TREATMENT  Treatment depends on the type of fracture:  If your patella is still in the right position after the fracture and you can still straighten your leg out, you can usually be treated with a splint or cast for 4 6 weeks.  If your patella is broken into multiple small pieces but you are able to straighten your leg, you can usually be treated with a splint or cast for 4 6 weeks. Sometimes your patella may need to be removed before the cast is applied.  If you cannot straighten out your leg after a patellar fracture, then surgery is required to hold the bony fragments together until they heal. A cast or splint will be applied for 4 6 weeks. HOME CARE INSTRUCTIONS   Only take over-the-counter or prescription medicines for pain, discomfort, or fever as directed by your health care provider.  Use crutches as directed, and exercise the leg as directed.  Apply ice to the injured area:  Put ice in a plastic bag.  Place a towel between your skin and the bag.  Leave the ice on for 20 minutes, 2 3 times a day.  Elevate the affected knee above the level of your heart. SEEK MEDICAL CARE IF:  You suspect you have significantly injured your knee.  You hear a pop after a knee injury.  Your knee is misshapen after a knee  injury.  You have pain when you move your knee.  You have difficulty walking or putting weight on your knee.  You cannot fully move your knee. SEEK IMMEDIATE MEDICAL CARE IF:  You have redness, swelling, or increasing pain in your knee.  You have a fever. Document Released: 12/31/2002 Document Revised: 01/22/2013 Document Reviewed: 11/13/2012 [facility] Patient Information 2014 [place].

## 2013-07-16 NOTE — ED Notes (Signed)
MD at bedside. 

## 2013-07-20 NOTE — H&P (Signed)
[name] is an 80 y.o. female.    Procedure:   Open reduction internal fixation of the right patella  Chief Complaint:    Right patella fracture   HPI: Pt is a 80 y.o. female complaining of right knee pain. While she was outside of [place] on 07/15/2013 she states that she tripped over a speed bump and fell to the ground.  She fell landing directly on the right anterior knee.  She went to the ER were x-rays revealed a patella fracture. She presented to [facility] were she was seen by Dr.Kendall.  The patient was presented to Dr. Alvan Dame who will do an ORIF of the right patella fracture.  Risks, benefits and expectations were discussed with the patient.  Risks including but not limited to the risk of anesthesia, blood clots, nerve damage, blood vessel damage, failure of the prosthesis, infection and up to and including death.  Patient understand the risks, benefits and expectations and wishes to proceed with surgery.   D/C Plans:   [place] (home or rehab area)  Post-op Meds:    No Rx given  Tranexamic Acid:   To be given  Decadron:    To be given  FYI:    Xarelto post-op  (kidney transplant)    Norco post-op   PMH: Past Medical History  Diagnosis Date  . Renal disorder   . Thyroid disease     PSH: Past Surgical History  Procedure Laterality Date  . Kidney transplant    . Joint replacement      Social History:  reports that she has never smoked. She has never used smokeless tobacco. She reports that she does not drink alcohol or use illicit drugs.  Allergies:  Allergies  Allergen Reactions  . Minocycline     Pt reported    Medications: No current facility-administered medications for this encounter.   Current Outpatient Prescriptions  Medication Sig Dispense Refill  . acetaminophen (TYLENOL) 325 MG tablet Take 650 mg by mouth every 6 (six) hours as needed.      Marland Kitchen alendronate (FOSAMAX) 70 MG tablet Take 70 mg by mouth every 7 (seven) days. On  Wednesdays. Take with a full glass of water on an empty stomach.      . Calcium Carbonate (CALCIUM 600 PO) Take 1 tablet by mouth 2 (two) times daily.      . cholecalciferol (VITAMIN D) 1000 UNITS tablet Take 1,000 Units by mouth 2 (two) times daily.      Marland Kitchen HYDROcodone-acetaminophen (NORCO/VICODIN) 5-325 MG per tablet Take 1-2 tablets by mouth every 6 (six) hours as needed for moderate pain or severe pain.  30 tablet  0  . levothyroxine (SYNTHROID, LEVOTHROID) 50 MCG tablet Take 50 mcg by mouth daily.      . predniSONE (DELTASONE) 5 MG tablet Take 5 mg by mouth daily.      Marland Kitchen sulfamethoxazole-trimethoprim (BACTRIM,SEPTRA) 400-80 MG per tablet Take 1 tablet by mouth See admin instructions. Every Monday Wednesday and Friday      . tacrolimus (PROGRAF) 1 MG capsule Take 1 mg by mouth 2 (two) times daily.         Review of Systems  Constitutional: Negative.   HENT: Negative.   Eyes: Negative.   Respiratory: Negative.   Cardiovascular: Negative.   Gastrointestinal: Negative.   Genitourinary: Negative.   Musculoskeletal: Positive for joint pain.  Skin: Negative.   Neurological: Negative.   Endo/Heme/Allergies: Negative.   Psychiatric/Behavioral: Negative.     Physical  Exam  Constitutional: She is oriented to person, place, and time. She appears well-developed and well-nourished.  HENT:  Head: Normocephalic and atraumatic.  Mouth/Throat: Oropharynx is clear and moist.  Eyes: Pupils are equal, round, and reactive to light.  Neck: Neck supple. No JVD present. No tracheal deviation present. No thyromegaly present.  Cardiovascular: Normal rate, regular rhythm, normal heart sounds and intact distal pulses.   Respiratory: Effort normal and breath sounds normal. No stridor. No respiratory distress. She has no wheezes.  GI: Soft. There is no tenderness. There is no guarding.  Musculoskeletal:       Right knee: She exhibits decreased range of motion, swelling, effusion, deformity, abnormal  patellar mobility and bony tenderness. She exhibits no ecchymosis, no laceration and no erythema. Tenderness found.  Lymphadenopathy:    She has no cervical adenopathy.  Neurological: She is alert and oriented to person, place, and time.  Skin: Skin is warm and dry.        Assessment/Plan Assessment:    Right patella fracture   Plan: Patient will undergo a open reduction internal fixation of the right patella on 07/22/2013 per Dr. Alvan Dame at [facility]. Risks benefits and expectations were discussed with the patient. Patient understand risks, benefits and expectations and wishes to proceed.      West Pugh Cliff Damiani   PAC  07/20/2013, 8:50 PM

## 2013-07-21 ENCOUNTER — Encounter (HOSPITAL_COMMUNITY): Payer: Self-pay | Admitting: *Deleted

## 2013-07-21 NOTE — Progress Notes (Signed)
PT IS ADDED ON TO OR SCHEDULE FOR SURGERY TOMORROW.  SAMEDAY SURGERY INSTRUCTIONS AND CHLORHEXIDINE SHOWER INSTRUCTIONS / PRECAUTIONS REVIEWED WITH PT BY PHONE.

## 2013-07-22 ENCOUNTER — Ambulatory Visit (HOSPITAL_COMMUNITY): Payer: Medicare Other

## 2013-07-22 ENCOUNTER — Encounter (HOSPITAL_COMMUNITY)
Admission: RE | Disposition: A | Discharge status: Home Health | Payer: Medicare Other | Source: Ambulatory Visit | Attending: Orthopedic Surgery

## 2013-07-22 ENCOUNTER — Encounter (HOSPITAL_COMMUNITY): Payer: Self-pay | Admitting: *Deleted

## 2013-07-22 ENCOUNTER — Inpatient Hospital Stay (HOSPITAL_COMMUNITY)
Admission: RE | Admit: 2013-07-22 | Discharge: 2013-07-23 | DRG: 493 | Disposition: A | Discharge status: Home Health | Payer: Medicare Other | Source: Ambulatory Visit | Attending: Orthopedic Surgery | Admitting: Orthopedic Surgery

## 2013-07-22 ENCOUNTER — Ambulatory Visit (HOSPITAL_COMMUNITY): Payer: Medicare Other | Admitting: Certified Registered Nurse Anesthetist

## 2013-07-22 ENCOUNTER — Encounter (HOSPITAL_COMMUNITY): Payer: Medicare Other | Admitting: Certified Registered Nurse Anesthetist

## 2013-07-22 DIAGNOSIS — M25069 Hemarthrosis, unspecified knee: Secondary | ICD-10-CM | POA: Diagnosis present

## 2013-07-22 DIAGNOSIS — Y9229 Other specified public building as the place of occurrence of the external cause: Secondary | ICD-10-CM

## 2013-07-22 DIAGNOSIS — D500 Iron deficiency anemia secondary to blood loss (chronic): Secondary | ICD-10-CM | POA: Diagnosis not present

## 2013-07-22 DIAGNOSIS — S82009A Unspecified fracture of unspecified patella, initial encounter for closed fracture: Principal | ICD-10-CM | POA: Diagnosis present

## 2013-07-22 DIAGNOSIS — D62 Acute posthemorrhagic anemia: Secondary | ICD-10-CM | POA: Diagnosis not present

## 2013-07-22 DIAGNOSIS — Z881 Allergy status to other antibiotic agents status: Secondary | ICD-10-CM

## 2013-07-22 DIAGNOSIS — E079 Disorder of thyroid, unspecified: Secondary | ICD-10-CM | POA: Diagnosis present

## 2013-07-22 DIAGNOSIS — Z79899 Other long term (current) drug therapy: Secondary | ICD-10-CM

## 2013-07-22 DIAGNOSIS — S82001A Unspecified fracture of right patella, initial encounter for closed fracture: Secondary | ICD-10-CM | POA: Diagnosis present

## 2013-07-22 DIAGNOSIS — Z940 Kidney transplant status: Secondary | ICD-10-CM

## 2013-07-22 DIAGNOSIS — W010XXA Fall on same level from slipping, tripping and stumbling without subsequent striking against object, initial encounter: Secondary | ICD-10-CM | POA: Diagnosis present

## 2013-07-22 HISTORY — DX: Unspecified osteoarthritis, unspecified site: M19.90

## 2013-07-22 HISTORY — PX: ORIF PATELLA: SHX5033

## 2013-07-22 HISTORY — DX: Unspecified disorder of nose and nasal sinuses: J34.9

## 2013-07-22 HISTORY — DX: Hypothyroidism, unspecified: E03.9

## 2013-07-22 HISTORY — DX: Malignant (primary) neoplasm, unspecified: C80.1

## 2013-07-22 LAB — TYPE AND SCREEN
ABO/RH(D): O POS
ANTIBODY SCREEN: NEGATIVE

## 2013-07-22 LAB — URINALYSIS, ROUTINE W REFLEX MICROSCOPIC
BILIRUBIN URINE: NEGATIVE
GLUCOSE, UA: NEGATIVE mg/dL
Hgb urine dipstick: NEGATIVE
Ketones, ur: NEGATIVE mg/dL
Leukocytes, UA: NEGATIVE
Nitrite: NEGATIVE
Protein, ur: NEGATIVE mg/dL
SPECIFIC GRAVITY, URINE: 1.015 (ref 1.005–1.030)
Urobilinogen, UA: 0.2 mg/dL (ref 0.0–1.0)
pH: 6 (ref 5.0–8.0)

## 2013-07-22 LAB — CBC
HCT: 33.6 % — ABNORMAL LOW (ref 36.0–46.0)
Hemoglobin: 11.9 g/dL — ABNORMAL LOW (ref 12.0–15.0)
MCH: 32.6 pg (ref 26.0–34.0)
MCHC: 35.4 g/dL (ref 30.0–36.0)
MCV: 92.1 fL (ref 78.0–100.0)
Platelets: 165 10*3/uL (ref 150–400)
RBC: 3.65 MIL/uL — AB (ref 3.87–5.11)
RDW: 13.4 % (ref 11.5–15.5)
WBC: 5.4 10*3/uL (ref 4.0–10.5)

## 2013-07-22 LAB — BASIC METABOLIC PANEL
BUN: 27 mg/dL — ABNORMAL HIGH (ref 6–23)
CO2: 23 meq/L (ref 19–32)
CREATININE: 1.32 mg/dL — AB (ref 0.50–1.10)
Calcium: 9.9 mg/dL (ref 8.4–10.5)
Chloride: 101 mEq/L (ref 96–112)
GFR calc Af Amer: 43 mL/min — ABNORMAL LOW (ref >90)
GFR, EST NON AFRICAN AMERICAN: 37 mL/min — AB (ref >90)
Glucose, Bld: 102 mg/dL — ABNORMAL HIGH (ref 70–99)
Potassium: 4.3 mEq/L (ref 3.7–5.3)
Sodium: 139 mEq/L (ref 137–147)

## 2013-07-22 LAB — PROTIME-INR
INR: 0.98 (ref 0.00–1.49)
PROTHROMBIN TIME: 12.8 s (ref 11.6–15.2)

## 2013-07-22 LAB — APTT: aPTT: 28 seconds (ref 24–37)

## 2013-07-22 SURGERY — OPEN REDUCTION INTERNAL FIXATION (ORIF) PATELLA
Anesthesia: General | Site: Knee | Laterality: Right

## 2013-07-22 MED ORDER — SODIUM CHLORIDE 0.9 % IV SOLN
INTRAVENOUS | Status: DC
  Administered 2013-07-22 – 2013-07-23 (×2): via INTRAVENOUS
  Filled 2013-07-22 (×9): qty 1000

## 2013-07-22 MED ORDER — FENTANYL CITRATE 0.05 MG/ML IJ SOLN
INTRAMUSCULAR | Status: DC | PRN
  Administered 2013-07-22 (×3): 50 ug via INTRAVENOUS
  Administered 2013-07-22: 100 ug via INTRAVENOUS

## 2013-07-22 MED ORDER — LIDOCAINE HCL (CARDIAC) 20 MG/ML IV SOLN
INTRAVENOUS | Status: DC | PRN
  Administered 2013-07-22: 50 mg via INTRAVENOUS

## 2013-07-22 MED ORDER — LACTATED RINGERS IV SOLN
INTRAVENOUS | Status: DC
  Administered 2013-07-22: 1000 mL via INTRAVENOUS
  Administered 2013-07-22: 12:00:00 via INTRAVENOUS

## 2013-07-22 MED ORDER — 0.9 % SODIUM CHLORIDE (POUR BTL) OPTIME
TOPICAL | Status: DC | PRN
  Administered 2013-07-22: 1000 mL

## 2013-07-22 MED ORDER — CHLORHEXIDINE GLUCONATE 4 % EX LIQD: 60.0000 mL | Freq: Once | CUTANEOUS | Status: DC

## 2013-07-22 MED ORDER — RIVAROXABAN 10 MG PO TABS
10.0000 mg | ORAL_TABLET | ORAL | Status: DC
  Administered 2013-07-23: 10 mg via ORAL
  Filled 2013-07-22 (×2): qty 1

## 2013-07-22 MED ORDER — PREDNISONE 5 MG PO TABS
5.0000 mg | ORAL_TABLET | Freq: Every day | ORAL | Status: DC
Start: 2013-07-23 — End: 2013-07-23
  Administered 2013-07-23: 5 mg via ORAL
  Filled 2013-07-22 (×2): qty 1

## 2013-07-22 MED ORDER — LIDOCAINE HCL (CARDIAC) 20 MG/ML IV SOLN
INTRAVENOUS | Status: AC
  Filled 2013-07-22: qty 5

## 2013-07-22 MED ORDER — CLINDAMYCIN PHOSPHATE 600 MG/50ML IV SOLN
600.0000 mg | Freq: Four times a day (QID) | INTRAVENOUS | Status: AC
  Administered 2013-07-22 – 2013-07-23 (×2): 600 mg via INTRAVENOUS
  Filled 2013-07-22 (×2): qty 50

## 2013-07-22 MED ORDER — PROPOFOL 10 MG/ML IV BOLUS
INTRAVENOUS | Status: AC
  Filled 2013-07-22: qty 20

## 2013-07-22 MED ORDER — MEPERIDINE HCL 50 MG/ML IJ SOLN: 6.2500 mg | INTRAMUSCULAR | Status: DC | PRN

## 2013-07-22 MED ORDER — DEXAMETHASONE SODIUM PHOSPHATE 10 MG/ML IJ SOLN
10.0000 mg | Freq: Once | INTRAMUSCULAR | Status: AC
  Administered 2013-07-22: 10 mg via INTRAVENOUS

## 2013-07-22 MED ORDER — CEFAZOLIN SODIUM-DEXTROSE 2-3 GM-% IV SOLR: 2.0000 g | INTRAVENOUS | Status: DC

## 2013-07-22 MED ORDER — HYDROMORPHONE HCL PF 1 MG/ML IJ SOLN
INTRAMUSCULAR | Status: AC
  Filled 2013-07-22: qty 1

## 2013-07-22 MED ORDER — METHOCARBAMOL 100 MG/ML IJ SOLN
500.0000 mg | Freq: Four times a day (QID) | INTRAVENOUS | Status: DC | PRN
  Administered 2013-07-22: 500 mg via INTRAVENOUS
  Filled 2013-07-22: qty 5

## 2013-07-22 MED ORDER — ONDANSETRON HCL 4 MG PO TABS: 4.0000 mg | ORAL_TABLET | Freq: Four times a day (QID) | ORAL | Status: DC | PRN

## 2013-07-22 MED ORDER — DEXAMETHASONE SODIUM PHOSPHATE 10 MG/ML IJ SOLN
INTRAMUSCULAR | Status: AC
  Filled 2013-07-22: qty 1

## 2013-07-22 MED ORDER — TRANEXAMIC ACID 100 MG/ML IV SOLN
1000.0000 mg | Freq: Once | INTRAVENOUS | Status: AC
  Administered 2013-07-22: 1000 mg via INTRAVENOUS
  Filled 2013-07-22: qty 10

## 2013-07-22 MED ORDER — HYDROCODONE-ACETAMINOPHEN 7.5-325 MG PO TABS
1.0000 | ORAL_TABLET | ORAL | Status: DC
  Administered 2013-07-22: 2 via ORAL
  Administered 2013-07-22: 1 via ORAL
  Administered 2013-07-23 (×4): 2 via ORAL
  Filled 2013-07-22 (×4): qty 2
  Filled 2013-07-22: qty 1
  Filled 2013-07-22: qty 2

## 2013-07-22 MED ORDER — LEVOTHYROXINE SODIUM 50 MCG PO TABS
50.0000 ug | ORAL_TABLET | Freq: Every day | ORAL | Status: DC
  Administered 2013-07-23: 50 ug via ORAL
  Filled 2013-07-22 (×2): qty 1

## 2013-07-22 MED ORDER — FENTANYL CITRATE 0.05 MG/ML IJ SOLN
INTRAMUSCULAR | Status: AC
  Filled 2013-07-22: qty 5

## 2013-07-22 MED ORDER — PHENYLEPHRINE HCL 10 MG/ML IJ SOLN
INTRAMUSCULAR | Status: DC | PRN
  Administered 2013-07-22 (×2): 40 ug via INTRAVENOUS

## 2013-07-22 MED ORDER — DOCUSATE SODIUM 100 MG PO CAPS
100.0000 mg | ORAL_CAPSULE | Freq: Two times a day (BID) | ORAL | Status: DC
  Administered 2013-07-22 – 2013-07-23 (×2): 100 mg via ORAL

## 2013-07-22 MED ORDER — PHENOL 1.4 % MT LIQD: 1.0000 | OROMUCOSAL | Status: DC | PRN

## 2013-07-22 MED ORDER — POLYETHYLENE GLYCOL 3350 17 G PO PACK
17.0000 g | PACK | Freq: Two times a day (BID) | ORAL | Status: DC
  Administered 2013-07-22 – 2013-07-23 (×2): 17 g via ORAL

## 2013-07-22 MED ORDER — METOCLOPRAMIDE HCL 5 MG/ML IJ SOLN: 5.0000 mg | Freq: Three times a day (TID) | INTRAMUSCULAR | Status: DC | PRN

## 2013-07-22 MED ORDER — DIPHENHYDRAMINE HCL 25 MG PO CAPS: 25.0000 mg | ORAL_CAPSULE | Freq: Four times a day (QID) | ORAL | Status: DC | PRN

## 2013-07-22 MED ORDER — HYDROMORPHONE HCL PF 1 MG/ML IJ SOLN
0.2500 mg | INTRAMUSCULAR | Status: DC | PRN
  Administered 2013-07-22: 0.25 mg via INTRAVENOUS
  Administered 2013-07-22: 0.5 mg via INTRAVENOUS
  Administered 2013-07-22: 0.25 mg via INTRAVENOUS

## 2013-07-22 MED ORDER — MENTHOL 3 MG MT LOZG: 1.0000 | LOZENGE | OROMUCOSAL | Status: DC | PRN

## 2013-07-22 MED ORDER — MAGNESIUM CITRATE PO SOLN: 1.0000 | Freq: Once | ORAL | Status: AC | PRN

## 2013-07-22 MED ORDER — ONDANSETRON HCL 4 MG/2ML IJ SOLN
INTRAMUSCULAR | Status: DC | PRN
  Administered 2013-07-22: 4 mg via INTRAVENOUS

## 2013-07-22 MED ORDER — METOCLOPRAMIDE HCL 10 MG PO TABS: 5.0000 mg | ORAL_TABLET | Freq: Three times a day (TID) | ORAL | Status: DC | PRN

## 2013-07-22 MED ORDER — OXYCODONE HCL 5 MG/5ML PO SOLN
5.0000 mg | Freq: Once | ORAL | Status: DC | PRN
  Filled 2013-07-22: qty 5

## 2013-07-22 MED ORDER — METHOCARBAMOL 500 MG PO TABS
500.0000 mg | ORAL_TABLET | Freq: Four times a day (QID) | ORAL | Status: DC | PRN
  Administered 2013-07-23: 500 mg via ORAL
  Filled 2013-07-22: qty 1

## 2013-07-22 MED ORDER — BISACODYL 10 MG RE SUPP: 10.0000 mg | Freq: Every day | RECTAL | Status: DC | PRN

## 2013-07-22 MED ORDER — FERROUS SULFATE 325 (65 FE) MG PO TABS
325.0000 mg | ORAL_TABLET | Freq: Three times a day (TID) | ORAL | Status: DC
  Administered 2013-07-22 – 2013-07-23 (×3): 325 mg via ORAL
  Filled 2013-07-22 (×5): qty 1

## 2013-07-22 MED ORDER — CELECOXIB 200 MG PO CAPS
200.0000 mg | ORAL_CAPSULE | Freq: Two times a day (BID) | ORAL | Status: DC
  Filled 2013-07-22 (×4): qty 1

## 2013-07-22 MED ORDER — HYDROMORPHONE HCL PF 1 MG/ML IJ SOLN
0.5000 mg | INTRAMUSCULAR | Status: DC | PRN
  Administered 2013-07-22: 0.5 mg via INTRAVENOUS
  Filled 2013-07-22: qty 1

## 2013-07-22 MED ORDER — ONDANSETRON HCL 4 MG/2ML IJ SOLN: 4.0000 mg | Freq: Four times a day (QID) | INTRAMUSCULAR | Status: DC | PRN

## 2013-07-22 MED ORDER — VALGANCICLOVIR HCL 450 MG PO TABS
450.0000 mg | ORAL_TABLET | Freq: Two times a day (BID) | ORAL | Status: DC
  Filled 2013-07-22 (×3): qty 1

## 2013-07-22 MED ORDER — MYCOPHENOLATE SODIUM 180 MG PO TBEC
180.0000 mg | DELAYED_RELEASE_TABLET | Freq: Every day | ORAL | Status: DC
  Filled 2013-07-22 (×2): qty 1

## 2013-07-22 MED ORDER — TACROLIMUS 1 MG PO CAPS
1.5000 mg | ORAL_CAPSULE | Freq: Two times a day (BID) | ORAL | Status: DC
  Administered 2013-07-22 – 2013-07-23 (×2): 1.5 mg via ORAL
  Filled 2013-07-22 (×3): qty 1

## 2013-07-22 MED ORDER — CLINDAMYCIN PHOSPHATE 900 MG/50ML IV SOLN
900.0000 mg | Freq: Once | INTRAVENOUS | Status: AC
  Administered 2013-07-22: 900 mg via INTRAVENOUS

## 2013-07-22 MED ORDER — OXYCODONE HCL 5 MG PO TABS: 5.0000 mg | ORAL_TABLET | Freq: Once | ORAL | Status: DC | PRN

## 2013-07-22 MED ORDER — PROPOFOL 10 MG/ML IV BOLUS
INTRAVENOUS | Status: DC | PRN
  Administered 2013-07-22: 120 mg via INTRAVENOUS

## 2013-07-22 MED ORDER — SUCCINYLCHOLINE CHLORIDE 20 MG/ML IJ SOLN
INTRAMUSCULAR | Status: DC | PRN
  Administered 2013-07-22: 100 mg via INTRAVENOUS

## 2013-07-22 MED ORDER — DEXAMETHASONE SODIUM PHOSPHATE 10 MG/ML IJ SOLN
10.0000 mg | Freq: Once | INTRAMUSCULAR | Status: AC
  Administered 2013-07-23: 10 mg via INTRAVENOUS
  Filled 2013-07-22: qty 1

## 2013-07-22 MED ORDER — PROMETHAZINE HCL 25 MG/ML IJ SOLN: 6.2500 mg | INTRAMUSCULAR | Status: DC | PRN

## 2013-07-22 MED ORDER — CLINDAMYCIN PHOSPHATE 900 MG/50ML IV SOLN
INTRAVENOUS | Status: AC
  Filled 2013-07-22: qty 50

## 2013-07-22 MED ORDER — MYCOPHENOLATE SODIUM 180 MG PO TBEC
360.0000 mg | DELAYED_RELEASE_TABLET | Freq: Every day | ORAL | Status: DC
  Filled 2013-07-22: qty 2

## 2013-07-22 MED ORDER — ALUM & MAG HYDROXIDE-SIMETH 200-200-20 MG/5ML PO SUSP: 30.0000 mL | ORAL | Status: DC | PRN

## 2013-07-22 SURGICAL SUPPLY — 62 items
ADH SKN CLS APL DERMABOND .7 (GAUZE/BANDAGES/DRESSINGS) ×1
BAG SPEC THK2 15X12 ZIP CLS (MISCELLANEOUS) ×1
BAG ZIPLOCK 12X15 (MISCELLANEOUS) ×3 IMPLANT
BANDAGE ELASTIC 6 VELCRO ST LF (GAUZE/BANDAGES/DRESSINGS) ×2 IMPLANT
BANDAGE ESMARK 6X9 LF (GAUZE/BANDAGES/DRESSINGS) ×1 IMPLANT
BNDG CMPR 9X6 STRL LF SNTH (GAUZE/BANDAGES/DRESSINGS) ×1
BNDG COHESIVE 6X5 TAN STRL LF (GAUZE/BANDAGES/DRESSINGS) ×3 IMPLANT
BNDG ESMARK 6X9 LF (GAUZE/BANDAGES/DRESSINGS) ×3
BNDG GAUZE ELAST 4 BULKY (GAUZE/BANDAGES/DRESSINGS) ×1 IMPLANT
CUFF TOURN SGL QUICK 34 (TOURNIQUET CUFF) ×3
CUFF TOURN SGL QUICK 44 (TOURNIQUET CUFF) IMPLANT
CUFF TRNQT CYL 34X4X40X1 (TOURNIQUET CUFF) IMPLANT
DECANTER SPIKE VIAL GLASS SM (MISCELLANEOUS) ×1 IMPLANT
DERMABOND ADVANCED (GAUZE/BANDAGES/DRESSINGS) ×2
DERMABOND ADVANCED .7 DNX12 (GAUZE/BANDAGES/DRESSINGS) IMPLANT
DRAPE C-ARMOR (DRAPES) ×2 IMPLANT
DRAPE INCISE IOBAN 66X45 STRL (DRAPES) ×3 IMPLANT
DRAPE U-SHAPE 47X51 STRL (DRAPES) ×3 IMPLANT
DRSG AQUACEL AG ADV 3.5X10 (GAUZE/BANDAGES/DRESSINGS) ×2 IMPLANT
DRSG EMULSION OIL 3X16 NADH (GAUZE/BANDAGES/DRESSINGS) ×1 IMPLANT
DRSG PAD ABDOMINAL 8X10 ST (GAUZE/BANDAGES/DRESSINGS) ×1 IMPLANT
DURAPREP 26ML APPLICATOR (WOUND CARE) ×5 IMPLANT
ELECT REM PT RETURN 9FT ADLT (ELECTROSURGICAL) ×3
ELECTRODE REM PT RTRN 9FT ADLT (ELECTROSURGICAL) ×1 IMPLANT
GLOVE BIOGEL PI IND STRL 7.5 (GLOVE) ×1 IMPLANT
GLOVE BIOGEL PI IND STRL 8 (GLOVE) ×1 IMPLANT
GLOVE BIOGEL PI INDICATOR 7.5 (GLOVE)
GLOVE BIOGEL PI INDICATOR 8 (GLOVE) ×4
GLOVE ECLIPSE 8.0 STRL XLNG CF (GLOVE) ×2 IMPLANT
GLOVE ORTHO TXT STRL SZ7.5 (GLOVE) ×6 IMPLANT
GLOVE SURG ORTHO 8.0 STRL STRW (GLOVE) ×1 IMPLANT
GOWN SPEC L3 XXLG W/TWL (GOWN DISPOSABLE) ×4 IMPLANT
GOWN STRL REUS W/TWL LRG LVL3 (GOWN DISPOSABLE) ×7 IMPLANT
IMMOBILIZER KNEE 20 (SOFTGOODS) ×3
IMMOBILIZER KNEE 20 THIGH 36 (SOFTGOODS) IMPLANT
MANIFOLD NEPTUNE II (INSTRUMENTS) ×3 IMPLANT
NS IRRIG 1000ML POUR BTL (IV SOLUTION) ×3 IMPLANT
PACK LOWER EXTREMITY WL (CUSTOM PROCEDURE TRAY) ×3 IMPLANT
PAD CAST 4YDX4 CTTN HI CHSV (CAST SUPPLIES) ×1 IMPLANT
PADDING CAST COTTON 4X4 STRL (CAST SUPPLIES)
PASSER SUT SWANSON 36MM LOOP (INSTRUMENTS) ×3 IMPLANT
POSITIONER SURGICAL ARM (MISCELLANEOUS) ×3 IMPLANT
SCREW SHORT THREAD 4.0X34 (Screw) ×4 IMPLANT
SET PAD KNEE POSITIONER (MISCELLANEOUS) ×2 IMPLANT
SPONGE GAUZE 4X4 12PLY (GAUZE/BANDAGES/DRESSINGS) ×1 IMPLANT
SPONGE LAP 18X18 X RAY DECT (DISPOSABLE) ×3 IMPLANT
SPONGE LAP 4X18 X RAY DECT (DISPOSABLE) ×3 IMPLANT
STAPLER VISISTAT (STAPLE) ×1 IMPLANT
SUT ETHIBOND NAB CT1 #1 30IN (SUTURE) ×4 IMPLANT
SUT FIBERWIRE #2 38 T-5 BLUE (SUTURE)
SUT MNCRL AB 4-0 PS2 18 (SUTURE) ×2 IMPLANT
SUT VIC AB 0 CT1 27 (SUTURE) ×6
SUT VIC AB 0 CT1 27XBRD ANTBC (SUTURE) ×2 IMPLANT
SUT VIC AB 1 CT1 27 (SUTURE)
SUT VIC AB 1 CT1 27XBRD ANTBC (SUTURE) ×2 IMPLANT
SUT VIC AB 1 CT1 36 (SUTURE) ×2 IMPLANT
SUT VIC AB 2-0 CT1 27 (SUTURE) ×12
SUT VIC AB 2-0 CT1 TAPERPNT 27 (SUTURE) ×2 IMPLANT
SUTURE FIBERWR #2 38 T-5 BLUE (SUTURE) ×2 IMPLANT
TOWEL OR 17X26 10 PK STRL BLUE (TOWEL DISPOSABLE) ×4 IMPLANT
TOWEL OR NON WOVEN STRL DISP B (DISPOSABLE) ×2 IMPLANT
WATER STERILE IRR 1500ML POUR (IV SOLUTION) ×1 IMPLANT

## 2013-07-22 NOTE — Transfer of Care (Signed)
Immediate Anesthesia Transfer of Care Note  Patient: [name]  Procedure(s) Performed: Procedure(s): OPEN REDUCTION INTERNAL (ORIF) FIXATION PATELLA (Right)  Patient Location: PACU  Anesthesia Type:General  Level of Consciousness: awake and alert   Airway & Oxygen Therapy: Patient Spontanous Breathing and Patient connected to face mask oxygen  Post-op Assessment: Report given to PACU RN and Post -op Vital signs reviewed and stable  Post vital signs: Reviewed and stable  Complications: No apparent anesthesia complications

## 2013-07-22 NOTE — Interval H&P Note (Signed)
History and Physical Interval Note:  07/22/2013 11:26 AM  [name]  has presented today for surgery, with the diagnosis of right patella fracture  The various methods of treatment have been discussed with the patient and family. After consideration of risks, benefits and other options for treatment, the patient has consented to  Procedure(s): OPEN REDUCTION INTERNAL (ORIF) FIXATION PATELLA (Right) as a surgical intervention .  The patient's history has been reviewed, patient examined, no change in status, stable for surgery.  I have reviewed the patient's chart and labs.  Questions were answered to the patient's satisfaction.     Mauri Pole

## 2013-07-22 NOTE — Brief Op Note (Signed)
07/22/2013  3:47 PM  PATIENT:  [name]  80 y.o. female  PRE-OPERATIVE DIAGNOSIS:  Displaced closed right patella fracture  POST-OPERATIVE DIAGNOSIS:  Displaced closed right patella fracture  PROCEDURE:  Procedure(s): OPEN REDUCTION INTERNAL (ORIF) FIXATION PATELLA (Right)  SURGEON:  Surgeon(s) and Role:    * Mauri Pole, MD - Primary  PHYSICIAN ASSISTANT: Danae Orleans, PA-C  ANESTHESIA:   general  EBL:  Total I/O In: [id number] [I.V.:900; IV Piggyback:850] Out: 400 [Urine:400]  BLOOD ADMINISTERED:none  DRAINS: none   LOCAL MEDICATIONS USED:  NONE  SPECIMEN:  No Specimen  DISPOSITION OF SPECIMEN:  N/A  COUNTS:  YES  TOURNIQUET:   Total Tourniquet Time Documented: Thigh (Right) - 46 minutes Total: Thigh (Right) - 46 minutes   DICTATION: .Other Dictation: Dictation Number [id number]  PLAN OF CARE: Admit to inpatient   PATIENT DISPOSITION:  PACU - hemodynamically stable.   Delay start of Pharmacological VTE agent (>24hrs) due to surgical blood loss or risk of bleeding: no

## 2013-07-22 NOTE — Anesthesia Preprocedure Evaluation (Signed)
Anesthesia Evaluation  Patient identified by MRN, date of birth, ID band Patient awake    Reviewed: Allergy & Precautions, H&P , NPO status , Patient's Chart, lab work & pertinent test results  Airway Mallampati: II TM Distance: >3 FB Neck ROM: Full    Dental  (+) Dental Advisory Given   Pulmonary neg pulmonary ROS,  breath sounds clear to auscultation        Cardiovascular negative cardio ROS  Rhythm:Regular Rate:Normal     Neuro/Psych negative neurological ROS  negative psych ROS   GI/Hepatic negative GI ROS, Neg liver ROS,   Endo/Other  Hypothyroidism   Renal/GU Renal disease     Musculoskeletal negative musculoskeletal ROS (+)   Abdominal   Peds  Hematology negative hematology ROS (+)   Anesthesia Other Findings   Reproductive/Obstetrics negative OB ROS                           Anesthesia Physical Anesthesia Plan  ASA: II  Anesthesia Plan: General   Post-op Pain Management:    Induction: Intravenous  Airway Management Planned: LMA  Additional Equipment:   Intra-op Plan:   Post-operative Plan: Extubation in OR  Informed Consent: I have reviewed the patients History and Physical, chart, labs and discussed the procedure including the risks, benefits and alternatives for the proposed anesthesia with the patient or authorized representative who has indicated his/her understanding and acceptance.   Dental advisory given  Plan Discussed with: CRNA  Anesthesia Plan Comments:         Anesthesia Quick Evaluation

## 2013-07-23 DIAGNOSIS — D500 Iron deficiency anemia secondary to blood loss (chronic): Secondary | ICD-10-CM | POA: Diagnosis not present

## 2013-07-23 LAB — BASIC METABOLIC PANEL
BUN: 22 mg/dL (ref 6–23)
CHLORIDE: 98 meq/L (ref 96–112)
CO2: 23 meq/L (ref 19–32)
CREATININE: 1.13 mg/dL — AB (ref 0.50–1.10)
Calcium: 8.6 mg/dL (ref 8.4–10.5)
GFR calc Af Amer: 52 mL/min — ABNORMAL LOW (ref >90)
GFR calc non Af Amer: 45 mL/min — ABNORMAL LOW (ref >90)
GLUCOSE: 116 mg/dL — AB (ref 70–99)
Potassium: 5.2 mEq/L (ref 3.7–5.3)
Sodium: 133 mEq/L — ABNORMAL LOW (ref 137–147)

## 2013-07-23 LAB — CBC
HCT: 30.6 % — ABNORMAL LOW (ref 36.0–46.0)
Hemoglobin: 10.8 g/dL — ABNORMAL LOW (ref 12.0–15.0)
MCH: 32.3 pg (ref 26.0–34.0)
MCHC: 35.3 g/dL (ref 30.0–36.0)
MCV: 91.6 fL (ref 78.0–100.0)
Platelets: 176 10*3/uL (ref 150–400)
RBC: 3.34 MIL/uL — AB (ref 3.87–5.11)
RDW: 13.1 % (ref 11.5–15.5)
WBC: 7.2 10*3/uL (ref 4.0–10.5)

## 2013-07-23 MED ORDER — HYDROCODONE-ACETAMINOPHEN 7.5-325 MG PO TABS: 1.0000 | ORAL_TABLET | ORAL | Status: DC

## 2013-07-23 MED ORDER — FERROUS SULFATE 325 (65 FE) MG PO TABS: 325.0000 mg | ORAL_TABLET | Freq: Three times a day (TID) | ORAL | Status: DC

## 2013-07-23 MED ORDER — TIZANIDINE HCL 4 MG PO CAPS: 4.0000 mg | ORAL_CAPSULE | Freq: Three times a day (TID) | ORAL | Status: DC | PRN

## 2013-07-23 MED ORDER — RIVAROXABAN 10 MG PO TABS: 10.0000 mg | ORAL_TABLET | ORAL | Status: DC

## 2013-07-23 MED ORDER — POLYETHYLENE GLYCOL 3350 17 G PO PACK: 17.0000 g | PACK | Freq: Two times a day (BID) | ORAL | Status: DC

## 2013-07-23 MED ORDER — DSS 100 MG PO CAPS: 100.0000 mg | ORAL_CAPSULE | Freq: Two times a day (BID) | ORAL | Status: DC

## 2013-07-23 NOTE — Progress Notes (Signed)
Clinical Social Work Department BRIEF PSYCHOSOCIAL ASSESSMENT 07/23/2013  Patient:  [name], [name]     Account Number:  [id number]     Admit date:  07/22/2013  Clinical Social Worker:  Lacie Scotts  Date/Time:  07/23/2013 08:16 AM  Referred by:  Physician  Date Referred:  07/23/2013 Referred for  SNF Placement   Other Referral:   Interview type:  Patient Other interview type:    PSYCHOSOCIAL DATA Living Status:  FACILITY Admitted from facility:  [facility] Level of care:  Independent Living Primary support name:  Kinzee Debarr Primary support relationship to patient:  SPOUSE Degree of support available:   supportive    CURRENT CONCERNS Current Concerns  Post-Acute Placement   Other Concerns:    SOCIAL WORK ASSESSMENT / PLAN Pt is a 80 yr old female from [place] admitted to [facility] on 4/7 with a dx of a right patella fx. Pt had surgery on 4/7 and will begin PT today. Pt hopes to return to her independent living following hospital d/c but will consider [facility] Rehab at [facility] if needed. SNF has been contacted and expects to have an opening on THURS/FRI of this week. CSW will contact [facility] following PT recommendations.   Assessment/plan status:  Psychosocial Support/Ongoing Assessment of Needs Other assessment/ plan:   Information/referral to community resources:   None needed at this time.    PATIENT'S/FAMILY'S RESPONSE TO PLAN OF CARE: Pt's mood is bright this am. Her pain is controlled. She is looking forward to working with PT. D/C planning is ongoing.    Werner Lean LCSW [phone]

## 2013-07-23 NOTE — Evaluation (Signed)
Occupational Therapy Evaluation Patient Details Name: [name] MRN: [id number] DOB: Sep 02, 1931 Today's Date: 07/23/2013    History of Present Illness pt is s/p R patella ORIF    Clinical Impression   Pt was admitted for the above surgery. She will benefit from skilled OT to increase safety and independence with adls.  Pt was independent prior to admission and wants to be as independent as possible.  Goals in acute are from supervision to min guard A.     Follow Up Recommendations  No OT follow up;Supervision/Assistance - 24 hour    Equipment Recommendations   (to be further assessed; possible 3:1)    Recommendations for Other Services       Precautions / Restrictions Precautions Precautions: Knee Precaution Comments: KI AAT Required Braces or Orthoses: Knee Immobilizer - Right Restrictions Weight Bearing Restrictions: No      Mobility Bed Mobility Overal bed mobility: Needs Assistance Bed Mobility: Supine to Sit;Sit to Supine     Supine to sit: Min assist Sit to supine: Min assist   General bed mobility comments: hob raised.  assist for RLE  Transfers Overall transfer level: Needs assistance Equipment used: Rolling walker (2 wheeled) Transfers: Sit to/from [facility] Sit to Stand: Min assist Stand pivot transfers: Min assist       General transfer comment: assist to steady; cues for UE/LE position    Balance                                            ADL Overall ADL's : Needs assistance/impaired             Lower Body Bathing: Minimal assistance;Sit to/from stand       Lower Body Dressing: Minimal assistance;Sit to/from stand   Toilet Transfer: Minimal assistance;Stand-pivot;BSC   Toileting- Clothing Manipulation and Hygiene: Set up;Sitting/lateral lean         General ADL Comments: performed spt to 3:1.  Pt had not been up since surgery.  Will further assess high commode to see if she needs a 3:1.   Educated on reacher (she has an old one and wants to get a new one) and concept of leg lifter--did nto have this to show her. will further educate on shower transfer.  Pt just keep leg straight--she is not sure if she will get into shower or wash at sink.  Husband can assist, but pt wants to be as independent as possible     Vision                     Perception     Praxis      Pertinent Vitals/Pain 5/10 bed; 8/10 with weight bearing in R knee.  Repositioned and ice applied     Hand Dominance     Extremity/Trunk Assessment Upper Extremity Assessment Upper Extremity Assessment: Overall WFL for tasks assessed           Communication Communication Communication: No difficulties   Cognition Arousal/Alertness: Awake/alert Behavior During Therapy: WFL for tasks assessed/performed Overall Cognitive Status: Within Functional Limits for tasks assessed                     General Comments       Exercises       Shoulder Instructions      Home Living Family/patient expects to be discharged  to:: Private residence Living Arrangements: Spouse/significant other                 Bathroom Shower/Tub: Occupational psychologist: Handicapped height         Additional Comments: lives in apt at [place] with husband.  High commode with grab bars      Prior Functioning/Environment Level of Independence: Independent             OT Diagnosis: Generalized weakness   OT Problem List: Decreased strength;Decreased activity tolerance;Decreased knowledge of use of DME or AE;Pain   OT Treatment/Interventions: Self-care/ADL training;DME and/or AE instruction;Patient/family education    OT Goals(Current goals can be found in the care plan section) Acute Rehab OT Goals Patient Stated Goal: I enjoyed hiking and like walking OT Goal Formulation: With patient Time For Goal Achievement: 07/30/13 Potential to Achieve Goals: Good ADL Goals Pt Will  Perform Lower Body Bathing: with supervision;sit to/from stand;with adaptive equipment Pt Will Transfer to Toilet: with min guard assist;ambulating;bedside commode (vs comfort height) Pt Will Perform Tub/Shower Transfer: Shower transfer;with min guard assist;ambulating (vs verbalize)  OT Frequency: Min 2X/week   Barriers to D/C:            Co-evaluation              End of Session    Activity Tolerance: Patient tolerated treatment well Patient left: in bed;with call bell/phone within reach   Time: 0912-0938 OT Time Calculation (min): 26 minCharges:  OT General Charges $OT Visit: 1 Procedure OT Evaluation $Initial OT Evaluation Tier I: 1 Procedure OT Treatments $Self Care/Home Management : 8-22 mins G-Codes:    Lesle Chris 07-27-2013, 9:57 AM  Lesle Chris, OTR/L [phone] Jul 27, 2013

## 2013-07-23 NOTE — Progress Notes (Signed)
   Subjective: 1 Day Post-Op Procedure(s) (LRB): OPEN REDUCTION INTERNAL (ORIF) FIXATION PATELLA (Right)   Patient reports pain as mild, pain controlled. No events throughout the night. We have discussed the importance of keeping the knee in extension and avoid flexion of the knee. Ready to be discharged home if she does well with PT and pain stays controlled.   Objective:   VITALS:   Filed Vitals:   07/23/13 0500  BP: 127/68  Pulse: 67  Temp: 98.3 F (36.8 C)  Resp: 16    Neurovascular intact Dorsiflexion/Plantar flexion intact Incision: dressing C/D/I No cellulitis present Compartment soft  LABS  Recent Labs  07/22/13 1000 07/23/13 0433  HGB 11.9* 10.8*  HCT 33.6* 30.6*  WBC 5.4 7.2  PLT 165 176     Recent Labs  07/22/13 1000 07/23/13 0433  NA 139 133*  K 4.3 5.2  BUN 27* 22  CREATININE 1.32* 1.13*  GLUCOSE 102* 116*     Assessment/Plan: 1 Day Post-Op Procedure(s) (LRB): OPEN REDUCTION INTERNAL (ORIF) FIXATION PATELLA (Right) Foley cath d/c'ed Advance diet Up with therapy D/C IV fluids Discharge home with home health Follow up in 2 weeks at [facility]. Follow up with OLIN,Brydon Spahr D in 2 weeks.  Contact information:  [facility] [address] [place] [phone]    Expected ABLA  Treated with iron and will observe        West Pugh. Tekia Waterbury   PAC  07/23/2013, 9:33 AM

## 2013-07-23 NOTE — Discharge Instructions (Signed)
Information on my medicine - XARELTO (Rivaroxaban)  This medication education was reviewed with me or my healthcare representative as part of my discharge preparation.  The pharmacist that spoke with me during my hospital stay was:  Joycelyn Rua, [facility]  Why was Xarelto prescribed for you? Xarelto was prescribed for you to reduce the risk of blood clots forming after orthopedic surgery. The medical term for these abnormal blood clots is venous thromboembolism (VTE).  What do you need to know about xarelto ? Take your Xarelto ONCE DAILY at the same time every day. You may take it either with or without food.  If you have difficulty swallowing the tablet whole, you may crush it and mix in applesauce just prior to taking your dose.  Take Xarelto exactly as prescribed by your doctor and DO NOT stop taking Xarelto without talking to the doctor who prescribed the medication.  Stopping without other VTE prevention medication to take the place of Xarelto may increase your risk of developing a clot.  After discharge, you should have regular check-up appointments with your healthcare provider that is prescribing your Xarelto.    What do you do if you miss a dose? If you miss a dose, take it as soon as you remember on the same day then continue your regularly scheduled once daily regimen the next day. Do not take two doses of Xarelto on the same day.   Important Safety Information A possible side effect of Xarelto is bleeding. You should call your healthcare provider right away if you experience any of the following:   Bleeding from an injury or your nose that does not stop.   Unusual colored urine (red or dark brown) or unusual colored stools (red or black).   Unusual bruising for unknown reasons.   A serious fall or if you hit your head (even if there is no bleeding).  Some medicines may interact with Xarelto and might increase your risk of bleeding while on Xarelto. To help avoid  this, consult your healthcare provider or pharmacist prior to using any new prescription or non-prescription medications, including herbals, vitamins, non-steroidal anti-inflammatory drugs (NSAIDs) and supplements.  This website has more information on Xarelto: [email or web address]

## 2013-07-23 NOTE — Evaluation (Signed)
Physical Therapy Evaluation Patient Details Name: FALLEN DOHNER MRN: [id number] DOB: 12-12-1931 Today's Date: 07/23/2013   History of Present Illness  pt is s/p R patella ORIF   Clinical Impression  Pt instructed in use of KI and absolutely no flexion of R knee. Pt instructed in no exercises at this time. Pt is functioning at a level to return home.    Follow Up Recommendations No PT follow up    Equipment Recommendations  None recommended by PT    Recommendations for Other Services       Precautions / Restrictions Precautions Precautions: Knee Precaution Comments: KI AAT Required Braces or Orthoses: Knee Immobilizer - Right      Mobility  Bed Mobility Overal bed mobility: Needs Assistance Bed Mobility: Supine to Sit;Sit to Supine     Supine to sit: Min assist Sit to supine: Min assist      Transfers Overall transfer level: Needs assistance Equipment used: Rolling walker (2 wheeled) Transfers: Sit to/from Stand Sit to Stand: Min guard Stand pivot transfers: Min guard       General transfer comment: assist to steady; cues for UE/LE position  Ambulation/Gait Ambulation/Gait assistance: Min guard Ambulation Distance (Feet): 150 Feet Assistive device: Rolling walker (2 wheeled)       General Gait Details: cues fior sequence  Stairs            Wheelchair Mobility    Modified Rankin (Stroke Patients Only)       Balance                                             Pertinent Vitals/Pain Knee cap is sore.    Home Living Family/patient expects to be discharged to:: Private residence Living Arrangements: Spouse/significant other Available Help at Discharge: Family Type of Home: House       Home Layout: One [place]: Environmental consultant - 2 wheels;Wheelchair - manual Additional Comments: lives in apt at [place] with husband.  High commode with grab bars    Prior Function Level of Independence: Independent          Comments: has been on RW x 1 week     Hand Dominance        Extremity/Trunk Assessment               Lower Extremity Assessment: RLE deficits/detail RLE Deficits / Details: requires assist for R leg support on/off bed, in/out of recliner       Communication   Communication: No difficulties  Cognition Arousal/Alertness: Awake/alert                          General Comments      Exercises        Assessment/Plan    PT Assessment Patent does not need any further PT services  PT Diagnosis     PT Problem List    PT Treatment Interventions     PT Goals (Current goals can be found in the Care Plan section) Acute Rehab PT Goals Patient Stated Goal: I enjoyed hiking and like walking PT Goal Formulation: No goals set, d/c therapy    Frequency     Barriers to discharge        Co-evaluation               End of Session Equipment  Utilized During Treatment: Right knee immobilizer Activity Tolerance: Patient tolerated treatment well Patient left: in bed;with call bell/phone within reach;with family/visitor present Nurse Communication: Mobility status         Time: 1052-1130 PT Time Calculation (min): 38 min   Charges:   PT Evaluation $Initial PT Evaluation Tier I: 1 Procedure PT Treatments $Gait Training: 8-22 mins $Therapeutic Activity: 8-22 mins $Self Care/Home Management: 8-22   PT G Codes:          Claretha Cooper 07/23/2013, 1:15 PM

## 2013-07-23 NOTE — Anesthesia Postprocedure Evaluation (Signed)
Anesthesia Post Note  Patient: [name]  Procedure(s) Performed: Procedure(s) (LRB): OPEN REDUCTION INTERNAL (ORIF) FIXATION PATELLA (Right)  Anesthesia type: General  Patient location: PACU  Post pain: Pain level controlled  Post assessment: Post-op Vital signs reviewed  Last Vitals: BP 127/68  Pulse 67  Temp(Src) 36.8 C (Oral)  Resp 16  Ht 5\' 6"  (1.676 m)  Wt 124 lb 4 oz (56.359 kg)  BMI 20.06 kg/m2  SpO2 100%  Post vital signs: Reviewed  Level of consciousness: sedated  Complications: No apparent anesthesia complications

## 2013-07-23 NOTE — Progress Notes (Signed)
CSW assisting with d/c planning. PN reviewed and pt/family contacted to confirm d/c plan. PT has provided recommendations. Pt will return home to independent living at [facility]. No PT follow up is needed. SNF has been updated.  Werner Lean LCSW [phone]

## 2013-07-23 NOTE — Progress Notes (Signed)
Utilization review completed.  

## 2013-07-23 NOTE — Op Note (Signed)
NAMEJAMICE, [name]              ACCOUNT NO.:  [id number]  MEDICAL RECORD NO.:  [id number]  LOCATION:  [id number]                         FACILITY:  [facility]  PHYSICIAN:  Pietro Cassis. Alvan Dame, M.D.  DATE OF BIRTH:  March 08, 1932  DATE OF PROCEDURE:  07/22/2013 DATE OF DISCHARGE:                              OPERATIVE REPORT   PREOPERATIVE DIAGNOSIS:  Displaced right patella fracture.  POSTOPERATIVE DIAGNOSIS:  Displaced right patella fracture.  PROCEDURE:  Open reduction and internal fixation of right patella fracture utilizing two 4.0 cannulated screws 34 mm with a #5 Ethibond suture utilized in a figure-of-eight fashion.  SURGEON:  Pietro Cassis. Alvan Dame, M.D.  ASSISTANT:  Cynthia Orleans, PA-C.  Note that Mr. [name] was present for the entirety of the case from preoperative positioning, perioperative management of the operative extremity, general facilitation of the case, and primary wound closure.  ANESTHESIA:  General.  SPECIMENS:  None.  COMPLICATIONS:  None.  TOURNIQUET TIME:  45 minutes at 250 mmHg.  INDICATIONS FOR PROCEDURE:  Ms. [name] is a 80 year old very pleasant female who unfortunately had a fall when she stumbled over a speed bump landing directly on her right knee.  She had immediate onset of pain, swelling, and bruising.  She was seen and evaluated in our office last week.  Radiographs revealed a displaced transverse patella fracture. Options were given to her from nonsurgical management versus operative intervention.  She wished to proceed with the operative intervention for benefits reviewed with her as perhaps early immobilization, possibility for improved overall alignment of her anatomy, as well as evacuation of hematoma.  Risks of infection, nonunion, need for future surgery were discussed and reviewed.  Consent was obtained for benefit of fracture management.  PROCEDURE IN DETAIL:  The patient was brought to the operative theater. Once adequate anesthesia,  preoperative antibiotics, Ancef administered. She was positioned supine with the right thigh tourniquet placed to the right lower extremity.  She was then prepped and draped in sterile fashion.  Time-out was performed identifying the patient, planned procedure, and extremity.  A midline incision was made followed by soft tissue exposure.  The patient's retinacular tissue was identified to be intact.  She did have a large hemarthrosis identified.  Upon entry into the retinaculum, fracture site was readily identified.  Large hematoma or hemarthrosis within the knee itself was evacuated.  The knee was then irrigated with normal saline solution multiple times to irrigate the knee out as well as remove comminuted bone fragments from the fracture site.  Once this was done, the bone tenaculum was utilized to reduce the fracture into a near anatomic position confirmed radiographically with the initial goal to use cannulated screw system, guide wires were then positioned parallel to one another from inferior to proximal direction, confirmed radiographically both in AP and lateral planes.  Once this was done, I drilled the outer cortex inferiorly, passed two 34 mm cancellous screws over the guidewires.  At this point, the bone tenaculum was removed.  Using a suture passer, I was able to pass a #5 Ethibond suture through the 2 cannulated screws, and pass them so they crossed over the anterior aspect of the patella and then sutured this  down tightly further adding compression.  The final radiographs were obtained in the AP and lateral planes.  I irrigated the knee wound out again.  We reapproximated the retinacular tissue as well as the extensor mechanism with #1 Vicryl suture over top of the aforementioned construction.  The remainder of the wound was closed with 2-0 Vicryl and running 4-0 Monocryl.  The knee was then cleaned, dried, and dressed sterilely using Dermabond and Aquacel  dressing.  She will be allowed to be weightbearing as tolerated with Physical Therapy.  We will need to keep her in a knee immobilizer at all times for 2-4 weeks determining to release her to range of motion activities when seen in followup.     Pietro Cassis Alvan Dame, M.D.     MDO/MEDQ  D:  07/22/2013  T:  07/23/2013  Job:  [id number]

## 2013-07-24 ENCOUNTER — Encounter (HOSPITAL_COMMUNITY): Payer: Self-pay | Admitting: Orthopedic Surgery

## 2013-07-28 ENCOUNTER — Ambulatory Visit: Payer: Medicare Other | Admitting: Infectious Diseases

## 2013-07-28 NOTE — Discharge Summary (Signed)
Physician Discharge Summary  Patient ID: SOMIA MAXIN MRN: [id number] DOB/AGE: 1931-06-24 80 y.o.  Admit date: 07/22/2013 Discharge date: 07/23/2013   Procedures:  Procedure(s) (LRB): OPEN REDUCTION INTERNAL (ORIF) FIXATION PATELLA (Right)  Attending Physician:  Dr. Paralee Cancel   Admission Diagnoses:   Right patella fracture   Discharge Diagnoses:  Principal Problem:   S/P Right patella ORIF Active Problems:   Expected blood loss anemia  Past Medical History  Diagnosis Date  . Thyroid disease   . Hypothyroidism   . Sinus problem     HX OF SINUS INFECTIONS   . Arthritis   . Cancer 1988    BREAST CANCER- MASTECTOMY RIGHT - NO CHEMO NO RADIATION  . Renal disorder     HX OF KIDNEY TRANSPLANT - STATES KIDNEY FUNCTION OK -DR. SANDFORD / DR. FOX -   [facility]    HPI: Pt is a 80 y.o. female complaining of right knee pain. While she was outside of [place] on 07/15/2013 she states that she tripped over a speed bump and fell to the ground. She fell landing directly on the right anterior knee. She went to the ER were x-rays revealed a patella fracture. She presented to [facility] were she was seen by Dr.Kendall. The patient was presented to Dr. Alvan Dame who will do an ORIF of the right patella fracture. Risks, benefits and expectations were discussed with the patient. Risks including but not limited to the risk of anesthesia, blood clots, nerve damage, blood vessel damage, failure of the prosthesis, infection and up to and including death. Patient understand the risks, benefits and expectations and wishes to proceed with surgery.   PCP: Horatio Pel, MD   Discharged Condition: good  Hospital Course:  Patient underwent the above stated procedure on 07/22/2013. Patient tolerated the procedure well and brought to the recovery room in good condition and subsequently to the floor.  POD #1 BP: 127/68 ; Pulse: 67 ; Temp: 98.3 F (36.8 C) ; Resp: 16  Patient  reports pain as mild, pain controlled. No events throughout the night. We have discussed the importance of keeping the knee in extension and avoid flexion of the knee. Ready to be discharged home.  Neurovascular intact, dorsiflexion/plantar flexion intact, incision: dressing C/D/I, no cellulitis present and compartment soft.   LABS  Basename    HGB  10.8  HCT  30.6    Discharge Exam: General appearance: alert, cooperative and no distress Extremities: Homans sign is negative, no sign of DVT, no edema, redness or tenderness in the calves or thighs and no ulcers, gangrene or trophic changes  Disposition:     Home with follow up in 2 weeks   Follow-up Information   Follow up with Mauri Pole, MD. Schedule an appointment as soon as possible for a visit in 2 weeks.   Specialty:  Orthopedic Surgery   Contact information:   [address] [place] [address] [place] [address] [phone]       Discharge Orders   Future Orders Complete By Expires   Call MD / Call 911  As directed    Constipation Prevention  As directed    Diet - low sodium heart healthy  As directed    Discharge instructions  As directed    Weight bearing as tolerated  As directed    Questions:     Laterality:     Extremity:          Medication List    STOP taking  these medications       HYDROcodone-acetaminophen 5-325 MG per tablet  Commonly known as:  NORCO/VICODIN  Replaced by:  HYDROcodone-acetaminophen 7.5-325 MG per tablet     sulfamethoxazole-trimethoprim 400-80 MG per tablet  Commonly known as:  BACTRIM,SEPTRA      TAKE these medications       alendronate 70 MG tablet  Commonly known as:  FOSAMAX  Take 70 mg by mouth every 7 (seven) days. On Saturdays. Take with a full glass of water on an empty stomach.     CALCIUM 600 PO  Take 1 tablet by mouth 2 (two) times daily.     cholecalciferol 1000 UNITS tablet  Commonly known as:  VITAMIN D  Take 1,000 Units by mouth 2 (two) times daily.      DSS 100 MG Caps  Take 100 mg by mouth 2 (two) times daily.     ferrous sulfate 325 (65 FE) MG tablet  Take 1 tablet (325 mg total) by mouth 3 (three) times daily after meals.     HYDROcodone-acetaminophen 7.5-325 MG per tablet  Commonly known as:  NORCO  Take 1-2 tablets by mouth every 4 (four) hours.     levothyroxine 50 MCG tablet  Commonly known as:  SYNTHROID, LEVOTHROID  Take 50 mcg by mouth daily.     mycophenolate 180 MG EC tablet  Commonly known as:  MYFORTIC  Take 180-360 mg by mouth 2 (two) times daily. 2 tablets in the morning and 1 tablet in the evening     polyethylene glycol packet  Commonly known as:  MIRALAX / GLYCOLAX  Take 17 g by mouth 2 (two) times daily.     predniSONE 5 MG tablet  Commonly known as:  DELTASONE  Take 5 mg by mouth daily.     rivaroxaban 10 MG Tabs tablet  Commonly known as:  XARELTO  Take 1 tablet (10 mg total) by mouth daily.     tacrolimus 1 MG capsule  Commonly known as:  PROGRAF  Take 1.5 mg by mouth 2 (two) times daily.     tiZANidine 4 MG capsule  Commonly known as:  ZANAFLEX  Take 1 capsule (4 mg total) by mouth 3 (three) times daily as needed for muscle spasms.     valGANciclovir 450 MG tablet  Commonly known as:  VALCYTE  Take 450 mg by mouth 2 (two) times daily.         Signed: [place] P[place]. Dalinda Heidt   PAC  07/28/2013, 9:47 AM

## 2014-01-20 ENCOUNTER — Other Ambulatory Visit: Payer: Self-pay | Admitting: *Deleted

## 2014-01-20 ENCOUNTER — Other Ambulatory Visit: Payer: Self-pay

## 2014-01-20 ENCOUNTER — Telehealth: Payer: Self-pay | Admitting: *Deleted

## 2014-01-20 DIAGNOSIS — Z853 Personal history of malignant neoplasm of breast: Secondary | ICD-10-CM

## 2014-01-20 DIAGNOSIS — Z9011 Acquired absence of right breast and nipple: Secondary | ICD-10-CM

## 2014-01-20 DIAGNOSIS — Z1239 Encounter for other screening for malignant neoplasm of breast: Secondary | ICD-10-CM

## 2014-01-20 NOTE — Telephone Encounter (Signed)
Should restart her meds, glad to see at next visit

## 2014-01-20 NOTE — Telephone Encounter (Signed)
Patient called stating the lesion above her right wrist has returned. It is not bad at this point; just a pinkish red area; however she is concerned about it worsening. She completed her antibiotics end of 06/2013. Dr. Algis Downs next available appointment is 02/03/14; please advise.

## 2014-01-20 NOTE — Telephone Encounter (Signed)
Patient notified to restart azithromycin 250 mg daily per Dr. [name]; which she has some at home. Scheduled f/u appt for 02/03/14

## 2014-01-23 ENCOUNTER — Ambulatory Visit
Admission: RE | Admit: 2014-01-23 | Discharge: 2014-01-23 | Disposition: A | Discharge status: Home / Self Care | Payer: Medicare Other | Source: Ambulatory Visit

## 2014-01-23 DIAGNOSIS — Z853 Personal history of malignant neoplasm of breast: Secondary | ICD-10-CM

## 2014-01-23 DIAGNOSIS — Z1239 Encounter for other screening for malignant neoplasm of breast: Secondary | ICD-10-CM

## 2014-01-23 DIAGNOSIS — Z9011 Acquired absence of right breast and nipple: Secondary | ICD-10-CM

## 2014-02-03 ENCOUNTER — Ambulatory Visit: Payer: Medicare Other | Admitting: Infectious Diseases

## 2014-02-25 ENCOUNTER — Ambulatory Visit (INDEPENDENT_AMBULATORY_CARE_PROVIDER_SITE_OTHER): Payer: Medicare Other | Admitting: Infectious Diseases

## 2014-02-25 VITALS — BP 147/80 | HR 79 | Temp 97.9°F | Ht 67.0 in | Wt 128.0 lb

## 2014-02-25 DIAGNOSIS — A319 Mycobacterial infection, unspecified: Secondary | ICD-10-CM

## 2014-02-25 NOTE — Progress Notes (Signed)
   Subjective:    Patient ID: [name], female    DOB: 02-21-1932, 80 y.o.   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 On prograf/prednisone 5mg  qday.  She had skin bx's and since early January 2014 has grew a large raised lesion on her R forearm. She was started on minocycline for 1 month however lesion persisted. She was started on azithro and levaquin and her lesion improved. Cx- M chelonea/abscessus (Sens- Amikacin, Tobramycin, Cefoxitin, Tigecycline, clarithro, Azithro, Bactrim, linezolid, clofazimine. I- Augmentin, Tobra, Imipenem, Cipro. R- moxi, doxy).  Has had some skin darkening due to mino (per Dr Ubaldo Glassing). Has been seen by Dr Amedeo Plenty as well.  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  She returned with return of worsening of raised lesion on her R wrist in October 2014.  She returned Mar 19, 2013 and was doing well.  By 04-24-13 she had stopped levaquin due to myalgias.  At her previous f/u, plan was to complete 6 months of azithro and bactrim (which she is on for prophylaxis). She completed this 06-2013.  I saw [name] in [place] last month and she let me know that the lesion had recurred.  She restarted her azithro ~ 10 days ago. Lesion has been raised, has not been draining. Painful with palpation.     Review of Systems  Constitutional: Negative for fever, appetite change and unexpected weight change.  Cardiovascular: Negative for leg swelling.  Genitourinary: Negative for difficulty urinating.  Hematological: Negative for adenopathy.       Objective:   Physical Exam  Constitutional: She appears well-developed and well-nourished.  Musculoskeletal:       Arms:         Assessment & Plan:

## 2014-02-25 NOTE — Assessment & Plan Note (Addendum)
Her lesion has recurred. Will start her on bactrim daily, continue azithro. Will have her seen by Dr Amedeo Plenty to see if this lesion can be removed. Will f/u with IRB to see if her previous clofazamine application was approved. She and i discussed IV therapy, we are both hesitant to start this. Will see her back next month.

## 2014-03-03 ENCOUNTER — Ambulatory Visit: Payer: Medicare Other | Admitting: Infectious Diseases

## 2014-03-03 ENCOUNTER — Telehealth: Payer: Self-pay | Admitting: Licensed Clinical Social Worker

## 2014-03-03 NOTE — Telephone Encounter (Signed)
Patient wanted refills on bactrim and azithromycin, patient states she is to take both bid. Patient was already on azithromycin, bactrim was added at her visit last week. Please advise on how patient should take both of these.

## 2014-03-04 ENCOUNTER — Other Ambulatory Visit: Payer: Self-pay | Admitting: Licensed Clinical Social Worker

## 2014-03-04 MED ORDER — AZITHROMYCIN 250 MG PO TABS: ORAL_TABLET | ORAL | Status: DC

## 2014-03-04 NOTE — Telephone Encounter (Signed)
Should be on azithro qday, bactrim qday

## 2014-03-06 ENCOUNTER — Other Ambulatory Visit: Payer: Self-pay | Admitting: *Deleted

## 2014-03-06 MED ORDER — SULFAMETHOXAZOLE-TRIMETHOPRIM 400-80 MG PO TABS: 1.0000 | ORAL_TABLET | Freq: Every day | ORAL | Status: DC

## 2014-03-19 ENCOUNTER — Ambulatory Visit: Payer: Medicare Other | Admitting: Internal Medicine

## 2014-03-27 ENCOUNTER — Telehealth: Payer: Self-pay | Admitting: Infectious Diseases

## 2014-04-01 ENCOUNTER — Other Ambulatory Visit: Payer: Self-pay | Admitting: Infectious Diseases

## 2014-04-01 NOTE — Telephone Encounter (Signed)
error 

## 2014-04-02 ENCOUNTER — Encounter: Payer: Self-pay | Admitting: Infectious Diseases

## 2014-04-02 ENCOUNTER — Ambulatory Visit (INDEPENDENT_AMBULATORY_CARE_PROVIDER_SITE_OTHER): Payer: Medicare Other | Admitting: Infectious Diseases

## 2014-04-02 VITALS — BP 154/77 | HR 80 | Temp 97.8°F | Wt 130.0 lb

## 2014-04-02 DIAGNOSIS — T8610 Unspecified complication of kidney transplant: Secondary | ICD-10-CM

## 2014-04-02 DIAGNOSIS — A319 Mycobacterial infection, unspecified: Secondary | ICD-10-CM

## 2014-04-02 NOTE — Assessment & Plan Note (Addendum)
I have reached out to [facility] txp ID to eval her. She is doing much better. I initiated the clofazamine IND application but will hold til after her eval at [facility]. Will try to keep her on dual therapy for at least 6 months unless [facility] suggests a different regimen. Will see her back in February.

## 2014-04-02 NOTE — Progress Notes (Signed)
   Subjective:    Patient ID: [name], female    DOB: 1931-12-26, 80 y.o.   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 On prograf/prednisone 5mg  qday.  She had skin bx's and since early January 2014 has grew a large raised lesion on her R forearm. She was started on minocycline for 1 month however lesion persisted. She was started on azithro and levaquin and her lesion improved. Cx- M chelonea/abscessus (Sens- Amikacin, Tobramycin, Cefoxitin, Tigecycline, clarithro, Azithro, Bactrim, linezolid, clofazimine. I- Augmentin, Tobra, Imipenem, Cipro. R- moxi, doxy).  Has had some skin darkening due to mino (per Dr Ubaldo Glassing). Has been seen by Dr Amedeo Plenty as well.  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  She returned with return of worsening of raised lesion on her R wrist in October 2014.  She returned Mar 19, 2013 and was doing well.  By 04-24-13 she had stopped levaquin due to myalgias.  At her previous f/u, plan was to complete 6 months of azithro and bactrim (which she is on for prophylaxis). She completed this 06-2013.  I saw [name] in [place] in October and she let me know that the lesion had recurred.  She restarted her azithro in hte beginning of november. Lesion has been raised, has not been draining. Painful with palpation. We have discussed her case, she wishes to defer further debridement til after the holidays. I also have set her up to be seen in the [facility] txp ID center, via Dr Brigitte Pulse.   Her BP is up today, she does notice this.    Review of Systems     Objective:   Physical Exam  Constitutional: She appears well-developed and well-nourished.  Musculoskeletal:       Arms:         Assessment & Plan:

## 2014-04-02 NOTE — Assessment & Plan Note (Signed)
She will f/u with renal for this as well as for her HTN.

## 2014-05-06 ENCOUNTER — Telehealth: Payer: Self-pay | Admitting: *Deleted

## 2014-05-06 NOTE — Telephone Encounter (Signed)
Lump on arm has decreased to a "bump," seeing Dr Amedeo Plenty next week.  Pt wanted to know if Dr. [name] still thought it was necessary for her to see Dr. Amedeo Plenty.  She did say that the place on her forearm has done the "shrinking" thing before.

## 2014-05-07 NOTE — Telephone Encounter (Signed)
Needs to see [facility] at least. Could skip Dr Amedeo Plenty

## 2014-05-20 NOTE — Telephone Encounter (Signed)
Husband spoke to [facility] surgeon.  No follow-up necessary at this time at [facility].  Dr Amedeo Plenty also concurred that no surgery necessary at this time.  A "wait-and-see" attitude per the husband for the time being.  The pt does have a follow-up appt w/ Dr. [name] for 06/02/14 @ 10:45 AM.

## 2014-06-02 ENCOUNTER — Ambulatory Visit (INDEPENDENT_AMBULATORY_CARE_PROVIDER_SITE_OTHER): Payer: Medicare Other | Admitting: Infectious Diseases

## 2014-06-02 ENCOUNTER — Encounter: Payer: Self-pay | Admitting: Infectious Diseases

## 2014-06-02 VITALS — BP 161/81 | HR 91 | Temp 97.5°F | Wt 130.0 lb

## 2014-06-02 DIAGNOSIS — A319 Mycobacterial infection, unspecified: Secondary | ICD-10-CM

## 2014-06-02 NOTE — Progress Notes (Signed)
   Subjective:    Patient ID: [name], female    DOB: [birth year], 81 y.o.   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 On prograf/prednisone 5mg  qday.  She had skin bx's and since early January 2014 has grew a large raised lesion on her R forearm. She was started on minocycline for 1 month however lesion persisted. She was started on azithro and levaquin and her lesion improved. Cx- M chelonea/abscessus (Sens- Amikacin, Tobramycin, Cefoxitin, Tigecycline, clarithro, Azithro, Bactrim, linezolid, clofazimine. I- Augmentin, Tobra, Imipenem, Cipro. R- moxi, doxy).  Has had some skin darkening due to mino (per Dr Ubaldo Glassing). Has been seen by Dr Amedeo Plenty as well.  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  She returned with return of worsening of raised lesion on her R wrist in October 2014.  She returned Mar 19, 2013 and was doing well.  By 04-24-13 she had stopped levaquin due to myalgias.  At her previous f/u, plan was to complete 6 months of azithro and bactrim (which she is on for prophylaxis). She completed this 06-2013.  I saw [name] in [place] in October and she let me know that the lesion had recurred.  She restarted her azithro in the beginning of november.  Her lesion has since resolved. She has been seen by surgery but deferred as her lesion has improved.  No f/c. No proximal erythema.  I had contacted [facility] to have her seen in Txp/ID but has not been seen.  Review of Systems     Objective:   Physical Exam  Constitutional: She appears well-developed and well-nourished.  Musculoskeletal:       Arms:         Assessment & Plan:

## 2014-06-02 NOTE — Assessment & Plan Note (Signed)
She is doing very well. She has not made her appt with [facility]. Will plan to see her back in 4 months, have her labs done at renal (CBC, CMP).  Will plan to keep her on her current anbx for 1 year.  If this recurs, try to get into surgery quickly, [facility].

## 2014-09-08 ENCOUNTER — Other Ambulatory Visit: Payer: Self-pay | Admitting: Internal Medicine

## 2014-09-08 DIAGNOSIS — M6289 Other specified disorders of muscle: Secondary | ICD-10-CM

## 2014-09-09 ENCOUNTER — Other Ambulatory Visit: Payer: Self-pay | Admitting: Internal Medicine

## 2014-09-09 DIAGNOSIS — R140 Abdominal distension (gaseous): Secondary | ICD-10-CM

## 2014-09-16 ENCOUNTER — Ambulatory Visit
Admission: RE | Admit: 2014-09-16 | Discharge: 2014-09-16 | Disposition: A | Discharge status: Home / Self Care | Payer: Medicare Other | Source: Ambulatory Visit | Attending: Internal Medicine | Admitting: Internal Medicine

## 2014-09-16 ENCOUNTER — Other Ambulatory Visit: Payer: Medicare Other

## 2014-09-16 DIAGNOSIS — R140 Abdominal distension (gaseous): Secondary | ICD-10-CM

## 2014-09-23 ENCOUNTER — Other Ambulatory Visit: Payer: Self-pay | Admitting: Internal Medicine

## 2014-09-23 DIAGNOSIS — R140 Abdominal distension (gaseous): Secondary | ICD-10-CM

## 2015-08-25 MED FILL — predniSONE 5 MG TABS: 5 | 30 days supply | Qty: 30 | Fill #0

## 2015-08-25 MED FILL — TACROLIMUS 1 MG CAPSULE: 1 | 30 days supply | Qty: 60 | Fill #0

## 2015-08-25 MED FILL — SULFAMETHOXAZOLE/TMP SS TAB: 400-80 | 28 days supply | Qty: 10 | Fill #0

## 2015-08-25 MED FILL — TACROLIMUS 0.5 MG CAPSULE: 0.5 | 30 days supply | Qty: 60 | Fill #0

## 2015-09-20 MED FILL — SULFAMETHOXAZOLE/TMP SS TAB: 400-80 | 24 days supply | Qty: 10 | Fill #1

## 2015-09-29 MED FILL — SULFAMETHOXAZOLE/TMP SS TAB: 400-80 | 49 days supply | Qty: 21 | Fill #2

## 2015-09-29 MED FILL — predniSONE 5 MG TABS: 5 | 30 days supply | Qty: 30 | Fill #1

## 2015-09-29 MED FILL — TACROLIMUS 1 MG CAPSULE: 1 | 30 days supply | Qty: 60 | Fill #1

## 2015-09-29 MED FILL — TACROLIMUS 0.5 MG CAPSULE: 0.5 | 30 days supply | Qty: 60 | Fill #1

## 2015-10-26 MED FILL — TACROLIMUS 1 MG CAPSULE: 1 | 30 days supply | Qty: 60 | Fill #2

## 2015-10-26 MED FILL — predniSONE 5 MG TABS: 5 | 30 days supply | Qty: 30 | Fill #2

## 2015-10-26 MED FILL — TACROLIMUS 0.5 MG CAPSULE: 0.5 | 30 days supply | Qty: 60 | Fill #2

## 2015-11-25 MED FILL — TACROLIMUS 0.5 MG CAPSULE: 0.5 | 30 days supply | Qty: 60 | Fill #3

## 2015-11-25 MED FILL — TACROLIMUS 1 MG CAPSULE: 1 | 30 days supply | Qty: 60 | Fill #3

## 2015-11-25 MED FILL — SULFAMETHOXAZOLE/TMP SS TAB: 400-80 | 49 days supply | Qty: 21 | Fill #3

## 2015-11-25 MED FILL — predniSONE 5 MG TABS: 5 | 30 days supply | Qty: 30 | Fill #3

## 2015-12-29 MED FILL — TACROLIMUS 0.5 MG CAPSULE: 0.5 | 30 days supply | Qty: 60 | Fill #4

## 2015-12-29 MED FILL — TACROLIMUS 1 MG CAPSULE: 1 | 30 days supply | Qty: 60 | Fill #4

## 2015-12-29 MED FILL — predniSONE 5 MG TABS: 5 | 30 days supply | Qty: 30 | Fill #4

## 2016-01-14 MED FILL — SULFAMETHOXAZOLE/TMP SS TAB: 400-80 | 49 days supply | Qty: 21 | Fill #4

## 2016-01-31 MED FILL — TACROLIMUS 1 MG CAPSULE: 1 | 30 days supply | Qty: 60 | Fill #5

## 2016-01-31 MED FILL — predniSONE 5 MG TABS: 5 | 30 days supply | Qty: 30 | Fill #5

## 2016-01-31 MED FILL — TACROLIMUS 0.5 MG CAPSULE: 0.5 | 30 days supply | Qty: 60 | Fill #5

## 2016-02-14 MED FILL — OFLOXACIN 0.3% EYE DROPS: 0.3 | 14 days supply | Qty: 5 | Fill #0

## 2016-02-14 MED FILL — BACITRACIN 500 UNIT/GM OPHT: 500 | 14 days supply | Qty: 4 | Fill #0

## 2016-03-01 MED FILL — TACROLIMUS 0.5 MG CAPSULE: 0.5 | 30 days supply | Qty: 60 | Fill #6

## 2016-03-01 MED FILL — predniSONE 5 MG TABS: 5 | 30 days supply | Qty: 30 | Fill #6

## 2016-03-01 MED FILL — TACROLIMUS 1 MG CAPSULE: 1 | 30 days supply | Qty: 60 | Fill #6

## 2016-03-01 MED FILL — SULFAMETHOXAZOLE/TMP SS TAB: 400-80 | 49 days supply | Qty: 21 | Fill #5

## 2016-03-07 MED FILL — CEPHALEXIN 500 MG CAPSULE: 500 | 10 days supply | Qty: 30 | Fill #0

## 2016-03-29 MED FILL — TACROLIMUS 0.5 MG CAPSULE: 0.5 | 30 days supply | Qty: 60 | Fill #7

## 2016-03-29 MED FILL — IPRATROPIUM 0.03% SPRAY: 0.03 | 30 days supply | Qty: 30 | Fill #0

## 2016-03-29 MED FILL — TACROLIMUS 1 MG CAPSULE: 1 | 30 days supply | Qty: 60 | Fill #7

## 2016-03-29 MED FILL — predniSONE 20 MG TABS: 20 | 8 days supply | Qty: 12 | Fill #0

## 2016-04-03 MED FILL — NYSTATIN 100,000 UNITS/ML S: 100000 | 14 days supply | Qty: 210 | Fill #0

## 2016-04-03 MED FILL — ALL DAY ALLERGY 10 MG TAB: 10 | 100 days supply | Qty: 100 | Fill #0

## 2016-04-05 MED FILL — predniSONE 5 MG TABS: 5 | 30 days supply | Qty: 30 | Fill #7

## 2016-04-07 MED FILL — MUCINEX ER 600 MG TABLET: 600 | 20 days supply | Qty: 40 | Fill #0

## 2016-04-07 MED FILL — FLUTICASONE PROP 50 MCG SPR: 50 | 30 days supply | Qty: 16 | Fill #0

## 2016-04-19 MED FILL — SULFAMETHOXAZOLE/TMP SS TAB: 400-80 | 49 days supply | Qty: 21 | Fill #6

## 2016-04-24 MED FILL — AMOX-CLAV 500-125 MG TABLET: 500-125 | 10 days supply | Qty: 20 | Fill #0

## 2016-04-26 MED FILL — LEVOTHYROXINE 50 MCG TABLET: 50 | 90 days supply | Qty: 90 | Fill #0

## 2016-05-04 MED FILL — TACROLIMUS 0.5 MG CAPSULE: 0.5 | 30 days supply | Qty: 60 | Fill #8

## 2016-05-04 MED FILL — predniSONE 5 MG TABS: 5 | 30 days supply | Qty: 30 | Fill #8

## 2016-05-04 MED FILL — TACROLIMUS 1 MG CAPSULE: 1 | 30 days supply | Qty: 60 | Fill #8

## 2016-05-05 MED FILL — AZITHROMYCIN 250 MG TABLET: 250 | 5 days supply | Qty: 6 | Fill #0

## 2016-06-01 MED FILL — TACROLIMUS 1 MG CAPSULE: 1 | 30 days supply | Qty: 60 | Fill #9

## 2016-06-01 MED FILL — predniSONE 5 MG TABS: 5 | 30 days supply | Qty: 30 | Fill #9

## 2016-06-01 MED FILL — TACROLIMUS 0.5 MG CAPSULE: 0.5 | 30 days supply | Qty: 60 | Fill #9

## 2016-06-01 MED FILL — SULFAMETHOXAZOLE/TMP SS TAB: 400-80 | 11 days supply | Qty: 5 | Fill #7

## 2016-06-27 MED FILL — SULFAMETHOXAZOLE/TMP SS TAB: 400-80 | 30 days supply | Qty: 10 | Fill #0 | Status: TO

## 2016-06-27 MED FILL — predniSONE 5 MG TABS: 5 | 30 days supply | Qty: 30 | Fill #10

## 2016-06-27 MED FILL — TACROLIMUS 0.5 MG CAPSULE: 0.5 | 30 days supply | Qty: 60 | Fill #10

## 2016-06-27 MED FILL — TACROLIMUS 1 MG CAPSULE: 1 | 30 days supply | Qty: 60 | Fill #10

## 2016-07-26 MED FILL — LEVOTHYROXINE 50 MCG TABLET: 50 | 90 days supply | Qty: 90 | Fill #1 | Status: TO

## 2016-07-26 MED FILL — predniSONE 5 MG TABS: 5 | 30 days supply | Qty: 30 | Fill #11

## 2016-07-26 MED FILL — TACROLIMUS 1 MG CAPSULE: 1 | 30 days supply | Qty: 60 | Fill #11

## 2016-07-26 MED FILL — SULFAMETHOXAZOLE/TMP SS TAB: 400-80 | 30 days supply | Qty: 10 | Fill #1 | Status: TO

## 2016-07-26 MED FILL — TACROLIMUS 0.5 MG CAPSULE: 0.5 | 30 days supply | Qty: 60 | Fill #11

## 2016-07-28 MED FILL — MUPIROCIN 2% OINTMENT: 2 | 10 days supply | Qty: 22 | Fill #0

## 2016-08-03 MED FILL — PENICILLIN VK 500 MG TABLET: 500 | 7 days supply | Qty: 14 | Fill #0

## 2016-08-18 MED FILL — SULFAMETHOXAZOLE/TMP SS TAB: 400-80 | 30 days supply | Qty: 10 | Fill #2 | Status: TO

## 2016-09-12 MED FILL — TACROLIMUS 1 MG CAPSULE: 1 | 30 days supply | Qty: 60 | Fill #0 | Status: TO

## 2016-09-12 MED FILL — SULFAMETHOXAZOLE/TMP SS TAB: 400-80 | 30 days supply | Qty: 10 | Fill #3 | Status: TO

## 2016-09-12 MED FILL — predniSONE 5 MG TABS: 5 | 30 days supply | Qty: 30 | Fill #0 | Status: TO

## 2016-09-12 MED FILL — AMITRIPTYLINE HCL 25 MG TAB: 25 | 30 days supply | Qty: 30 | Fill #0

## 2016-09-12 MED FILL — TACROLIMUS 0.5 MG CAPSULE: 0.5 | 30 days supply | Qty: 60 | Fill #0 | Status: TO

## 2016-11-30 MED FILL — CLINDAMYCIN HCL 300 MG CAPS: 300 | 4 days supply | Qty: 8 | Fill #0

## 2016-12-12 MED FILL — AMITRIPTYLINE HCL 25 MG TAB: 25 | 30 days supply | Qty: 30 | Fill #1

## 2016-12-12 MED FILL — TACROLIMUS 0.5 MG CAPSULE: 0.5 | 30 days supply | Qty: 60 | Fill #0

## 2016-12-13 MED FILL — predniSONE 5 MG TABS: 5 | 30 days supply | Qty: 30 | Fill #0

## 2016-12-13 MED FILL — SULFAMETHOXAZOLE/TMP SS TAB: 400-80 | 20 days supply | Qty: 10 | Fill #0

## 2016-12-13 MED FILL — TACROLIMUS 1 MG CAPSULE: 1 | 30 days supply | Qty: 60 | Fill #0

## 2017-01-05 MED FILL — SULFAMETHOXAZOLE/TMP SS TAB: 400-80 | 20 days supply | Qty: 10 | Fill #1

## 2017-01-05 MED FILL — predniSONE 5 MG TABS: 5 | 30 days supply | Qty: 30 | Fill #1

## 2017-01-05 MED FILL — TACROLIMUS 1 MG CAPSULE: 1 | 30 days supply | Qty: 60 | Fill #1

## 2017-01-05 MED FILL — TACROLIMUS 0.5 MG CAPSULE: 0.5 | 30 days supply | Qty: 60 | Fill #1

## 2017-01-30 MED FILL — predniSONE 5 MG TABS: 5 | 30 days supply | Qty: 30 | Fill #2

## 2017-01-30 MED FILL — SULFAMETHOXAZOLE/TMP SS TAB: 400-80 | 20 days supply | Qty: 10 | Fill #2

## 2017-01-30 MED FILL — TACROLIMUS 1 MG CAPSULE: 1 | 30 days supply | Qty: 60 | Fill #2

## 2017-01-30 MED FILL — TACROLIMUS 0.5 MG CAPSULE: 0.5 | 30 days supply | Qty: 60 | Fill #2

## 2017-01-31 MED FILL — LEVOTHYROXINE 50 MCG TABLET: 50 | 90 days supply | Qty: 90 | Fill #0

## 2017-02-16 MED FILL — MUPIROCIN 2% OINTMENT: 2 | 10 days supply | Qty: 22 | Fill #0

## 2017-02-16 MED FILL — FLUOROURACIL 5% CREAM: 5 | 20 days supply | Qty: 40 | Fill #0

## 2017-02-23 MED FILL — SULFAMETHOXAZOLE/TMP SS TAB: 400-80 | 20 days supply | Qty: 10 | Fill #3

## 2017-02-23 MED FILL — AMITRIPTYLINE HCL 25 MG TAB: 25 | 90 days supply | Qty: 90 | Fill #0

## 2017-03-01 MED FILL — MIRTAZAPINE 7.5 MG TABLET: 7.5 | 30 days supply | Qty: 30 | Fill #0

## 2017-03-06 ENCOUNTER — Ambulatory Visit: Payer: Medicare Other | Admitting: Neurology

## 2017-03-13 MED FILL — SULFAMETHOXAZOLE/TMP SS TAB: 400-80 | 20 days supply | Qty: 10 | Fill #4

## 2017-03-13 MED FILL — TACROLIMUS 1 MG CAPSULE: 1 | 30 days supply | Qty: 60 | Fill #3

## 2017-03-13 MED FILL — TACROLIMUS 0.5 MG CAPSULE: 0.5 | 30 days supply | Qty: 60 | Fill #3

## 2017-03-13 MED FILL — predniSONE 5 MG TABS: 5 | 30 days supply | Qty: 30 | Fill #3

## 2017-04-03 MED FILL — SULFAMETHOXAZOLE/TMP SS TAB: 400-80 | 20 days supply | Qty: 10 | Fill #5

## 2017-04-03 MED FILL — MIRTAZAPINE 7.5 MG TABLET: 7.5 | 30 days supply | Qty: 30 | Fill #1

## 2017-04-19 MED FILL — TACROLIMUS 1 MG CAPSULE: 1 | 30 days supply | Qty: 60 | Fill #4

## 2017-04-19 MED FILL — predniSONE 5 MG TABS: 5 | 30 days supply | Qty: 30 | Fill #4

## 2017-04-19 MED FILL — TACROLIMUS 0.5 MG CAPSULE: 0.5 | 30 days supply | Qty: 60 | Fill #4

## 2017-04-19 MED FILL — SULFAMETHOXAZOLE/TMP SS TAB: 400-80 | 28 days supply | Qty: 12 | Fill #0

## 2017-04-23 ENCOUNTER — Encounter (INDEPENDENT_AMBULATORY_CARE_PROVIDER_SITE_OTHER): Payer: Self-pay

## 2017-04-23 ENCOUNTER — Encounter: Payer: Self-pay | Admitting: Neurology

## 2017-04-23 ENCOUNTER — Ambulatory Visit: Payer: Medicare Other | Admitting: Neurology

## 2017-04-23 VITALS — BP 140/81 | HR 80 | Ht 66.0 in | Wt 112.5 lb

## 2017-04-23 DIAGNOSIS — R413 Other amnesia: Secondary | ICD-10-CM

## 2017-04-23 DIAGNOSIS — G3184 Mild cognitive impairment, so stated: Secondary | ICD-10-CM | POA: Insufficient documentation

## 2017-04-23 DIAGNOSIS — T8610 Unspecified complication of kidney transplant: Secondary | ICD-10-CM

## 2017-04-23 DIAGNOSIS — R42 Dizziness and giddiness: Secondary | ICD-10-CM

## 2017-04-23 NOTE — Progress Notes (Signed)
PATIENT: [name] DOB: 01-06-1932  Chief Complaint  Patient presents with  . Dizziness    Orthostatic Vitals:  Lying: 140/81, 80, Sitting: 114/64, 87, Standing: 113/68, 96, Standing x 3 minutes: 125/78, 112.  Dizziness is made worse by turning head side-to-side and when she first gets up.  . Memory Loss    MMSE 26/30 - 12 animals.  She has noticed increased difficulty with her short-term memory.  Marland Kitchen PCP    Deland Pretty, MD     HISTORICAL  [name] is a 84 year old female, seen in refer by her primary care doctor Deland Pretty, for evaluation of memory loss, dizziness, initial evaluation was on April 23, 2017.  I reviewed and summarized the referring note, she has past medical history of hypothyroidism, on supplement, depression anxiety, breast cancer, hyperlipidemia, hypertension, osteopenia, kidney transplant secondary to chronic kidney disease in 2012, iron deficiency anemia,  Patient is alone at today's clinical visit, was noted to have a hesitation about providing detailed history, she is a retired Designer, multimedia at age 16, currently lives at [facility] assisted living with her husband, she drove to office today,  She reported significant family history of memory loss, father died of dementia, her elderly sister at age 67 also suffered significant memory loss, she noted gradual onset memory loss since 2018, she has word finding difficulties, today's Mini-Mental status examination 26/30, she missed 3 out of 3 recalls.  She also complains of dizziness for a few years, she has transient lightheadedness with sudden positional change, such as quick body movement, getting up quickly from sitting down position.  REVIEW OF SYSTEMS: Full 14 system review of systems performed and notable only for as above  ALLERGIES: Allergies  Allergen Reactions  . Augmentin [Amoxicillin-Pot Clavulanate] Nausea And Vomiting  . Doxycycline Nausea And Vomiting  . Keflex  [Cephalexin] Hives  . Minocycline Hives and Other (See Comments)    Gave bladder infection     HOME MEDICATIONS: Current Outpatient Medications  Medication Sig Dispense Refill  . Acetaminophen (TYLENOL PO) Take by mouth as needed.    Marland Kitchen alendronate (FOSAMAX) 70 MG tablet Take 70 mg by mouth every 7 (seven) days. On Saturdays. Take with a full glass of water on an empty stomach.    Marland Kitchen amitriptyline (ELAVIL) 25 MG tablet Take by mouth.    . Calcium Carbonate (CALCIUM 600 PO) Take 1 tablet by mouth 2 (two) times daily.    . cholecalciferol (VITAMIN D) 1000 UNITS tablet Take 2,000 Units by mouth daily.     Marland Kitchen denosumab (PROLIA) 60 MG/ML SOLN injection Inject 60 mg into the skin every 6 (six) months. Administer in upper arm, thigh, or abdomen    . ferrous sulfate 325 (65 FE) MG tablet Take 1 tablet (325 mg total) by mouth 3 (three) times daily after meals.  3  . levothyroxine (SYNTHROID, LEVOTHROID) 50 MCG tablet Take 50 mcg by mouth daily.    . MECLIZINE HCL PO Take by mouth as needed.    . predniSONE (DELTASONE) 5 MG tablet Take 5 mg by mouth as needed.     . sulfamethoxazole-trimethoprim (BACTRIM,SEPTRA) 400-80 MG per tablet Take 1 tablet by mouth daily. 30 tablet 11  . tacrolimus (PROGRAF) 1 MG capsule Take 1.5 mg by mouth 2 (two) times daily.      No current facility-administered medications for this visit.     PAST MEDICAL HISTORY: Past Medical History:  Diagnosis Date  . Arthritis   .  Cancer ([place]) 1988   BREAST CANCER- MASTECTOMY RIGHT - NO CHEMO NO RADIATION  . CMV (cytomegalovirus infection) ([place])   . Depression with anxiety   . Dizziness   . Gout   . History of breast cancer   . Hypertension   . Hypothyroidism   . Iron deficiency anemia   . Memory loss   . Osteoarthritis   . Osteopenia   . Patella fracture   . Peripheral neuropathy   . Raynauds phenomenon   . Renal disorder    HX OF KIDNEY TRANSPLANT - STATES KIDNEY FUNCTION OK -DR. SANDFORD / DR. FOX -   [facility]  . Sinus problem    HX OF SINUS INFECTIONS   . Thyroid disease   . Vitamin D deficiency   . Wolff-Parkinson-White (WPW) syndrome     PAST SURGICAL HISTORY: Past Surgical History:  Procedure Laterality Date  . ABDOMINAL HYSTERECTOMY    . BREAST SURGERY     RIGHT MASTECTOMY AND AXILLARY NODE DISSECTION  . JOINT REPLACEMENT     LEFT TOTAL KNEE REPLACEMENT   . KIDNEY TRANSPLANT  march 2012   right side - surgery at [place]  . ORIF PATELLA Right 07/22/2013   Procedure: OPEN REDUCTION INTERNAL (ORIF) FIXATION PATELLA;  Surgeon: Mauri Pole, MD;  Location: [facility];  Service: Orthopedics;  Laterality: Right;  . SINUS SURGERY WITH INSTATRAK      FAMILY HISTORY: Family History  Problem Relation Age of Onset  . Other Mother        unsure of history   . Dementia Father   . Dementia Sister   . Cancer Brother        lung    SOCIAL HISTORY:  Social History   Socioeconomic History  . Marital status: Married    Spouse name: Not on file  . Number of children: 2  . Years of education: college  . Highest education level: Bachelor's degree (e.g., BA, AB, BS)  Social Needs  . Financial resource strain: Not on file  . Food insecurity - worry: Not on file  . Food insecurity - inability: Not on file  . Transportation needs - medical: Not on file  . Transportation needs - non-medical: Not on file  Occupational History  . Occupation: Retired  Tobacco Use  . Smoking status: Never Smoker  . Smokeless tobacco: Never Used  Substance and Sexual Activity  . Alcohol use: Yes    Comment: MAYBE ONE GLASS OF WINE EVERY FEW MONTHS  . Drug use: No  . Sexual activity: Not on file  Other Topics Concern  . Not on file  Social History Narrative   L[facility] at home with [facility].   Right-handed.   Occasional caffeine use.     PHYSICAL EXAM   Vitals:   04/23/17 0955  BP: 140/81  Pulse: 80  Weight: 112 lb 8 oz (51 kg)  Height: 5\' 6"  (1.676 m)     Not recorded      Body mass index is 18.16 kg/m.  PHYSICAL EXAMNIATION:  Gen: NAD, conversant, well nourised, obese, well groomed                     Cardiovascular: Regular rate rhythm, no peripheral edema, warm, nontender. Eyes: Conjunctivae clear without exudates or hemorrhage Neck: Supple, no carotid bruits. Pulmonary: Clear to auscultation bilaterally   NEUROLOGICAL EXAM:  MENTAL STATUS: Speech:    Speech is normal; fluent and spontaneous  with normal comprehension.  Cognition:     Orientation to time, place and person     Normal recent and remote memory     Normal Attention span and concentration     Normal Language, naming, repeating,spontaneous speech     Fund of knowledge   CRANIAL NERVES: CN II: Visual fields are full to confrontation. Fundoscopic exam is normal with sharp discs and no vascular changes. Pupils are round equal and briskly reactive to light. CN III, IV, VI: extraocular movement are normal. No ptosis. CN V: Facial sensation is intact to pinprick in all 3 divisions bilaterally. Corneal responses are intact.  CN VII: Face is symmetric with normal eye closure and smile. CN VIII: Hearing is normal to rubbing fingers CN IX, X: Palate elevates symmetrically. Phonation is normal. CN XI: Head turning and shoulder shrug are intact CN XII: Tongue is midline with normal movements and no atrophy.  MOTOR: There is no pronator drift of out-stretched arms. Muscle bulk and tone are normal. Muscle strength is normal.  REFLEXES: Reflexes are 2+ and symmetric at the biceps, triceps, knees, and absent at ankles plantar responses are flexor.  SENSORY: Intact to light touch, pinprick, decreased vibratory sensation at toes  COORDINATION: Rapid alternating movements and fine finger movements are intact. There is no dysmetria on finger-to-nose and heel-knee-shin.    GAIT/STANCE: Posture is normal. Gait is steady with normal steps, base, arm swing, and turning. Heel and  toe walking are normal. Tandem gait is normal.  Romberg is absent.   DIAGNOSTIC DATA (LABS, IMAGING, TESTING) - I reviewed patient records, labs, notes, testing and imaging myself where available.   ASSESSMENT AND PLAN  [name] is a 84 y.o. female   Mild cognitive impairment  Strong family history of dementia, father and sister suffer dementia  MRI of the brain  Laboratory evaluations  Bring her husband at next follow-up visit  Dizziness, positive orthostatic blood pressure changes  Length dependent sensory changes,  EMG nerve conduction study,   Marcial Pacas, M.D. Ph.D.  [facility] [address] [place], [place] [address] Ph: [phone] Fax: [phone]  [id number], Thayer Jew, MD

## 2017-04-24 ENCOUNTER — Telehealth: Payer: Self-pay | Admitting: *Deleted

## 2017-04-24 LAB — CBC WITH DIFFERENTIAL/PLATELET
BASOS: 1 %
Basophils Absolute: 0 10*3/uL (ref 0.0–0.2)
EOS (ABSOLUTE): 0 10*3/uL (ref 0.0–0.4)
EOS: 1 %
HEMATOCRIT: 43.4 % (ref 34.0–46.6)
HEMOGLOBIN: 14.8 g/dL (ref 11.1–15.9)
IMMATURE GRANS (ABS): 0 10*3/uL (ref 0.0–0.1)
Immature Granulocytes: 0 %
LYMPHS ABS: 1.1 10*3/uL (ref 0.7–3.1)
LYMPHS: 32 %
MCH: 31.2 pg (ref 26.6–33.0)
MCHC: 34.1 g/dL (ref 31.5–35.7)
MCV: 92 fL (ref 79–97)
MONOCYTES: 10 %
Monocytes Absolute: 0.3 10*3/uL (ref 0.1–0.9)
Neutrophils Absolute: 2 10*3/uL (ref 1.4–7.0)
Neutrophils: 56 %
Platelets: 182 10*3/uL (ref 150–379)
RBC: 4.74 x10E6/uL (ref 3.77–5.28)
RDW: 14 % (ref 12.3–15.4)
WBC: 3.5 10*3/uL (ref 3.4–10.8)

## 2017-04-24 LAB — COMPREHENSIVE METABOLIC PANEL
ALBUMIN: 4.5 g/dL (ref 3.5–4.7)
ALK PHOS: 87 IU/L (ref 39–117)
ALT: 16 IU/L (ref 0–32)
AST: 23 IU/L (ref 0–40)
Albumin/Globulin Ratio: 1.7 (ref 1.2–2.2)
BILIRUBIN TOTAL: 0.3 mg/dL (ref 0.0–1.2)
BUN / CREAT RATIO: 19 (ref 12–28)
BUN: 26 mg/dL (ref 8–27)
CO2: 23 mmol/L (ref 20–29)
CREATININE: 1.36 mg/dL — AB (ref 0.57–1.00)
Calcium: 9 mg/dL (ref 8.7–10.3)
Chloride: 99 mmol/L (ref 96–106)
GFR calc Af Amer: 42 mL/min/{1.73_m2} — ABNORMAL LOW (ref >59)
GFR calc non Af Amer: 36 mL/min/{1.73_m2} — ABNORMAL LOW (ref >59)
GLOBULIN, TOTAL: 2.7 g/dL (ref 1.5–4.5)
Glucose: 89 mg/dL (ref 65–99)
Potassium: 5 mmol/L (ref 3.5–5.2)
SODIUM: 138 mmol/L (ref 134–144)
Total Protein: 7.2 g/dL (ref 6.0–8.5)

## 2017-04-24 LAB — VITAMIN D 25 HYDROXY (VIT D DEFICIENCY, FRACTURES): Vit D, 25-Hydroxy: 36.8 ng/mL (ref 30.0–100.0)

## 2017-04-24 LAB — RPR: RPR Ser Ql: NONREACTIVE

## 2017-04-24 LAB — VITAMIN B12: VITAMIN B 12: 613 pg/mL (ref 232–1245)

## 2017-04-24 NOTE — Telephone Encounter (Signed)
-----   Message from Marcial Pacas, MD sent at 04/24/2017  7:38 AM EST ----- Please call patient for mild abnormal kidney functional test, GFR 36, which is about her baseline, rest of the lab were within normal limit.

## 2017-04-24 NOTE — Telephone Encounter (Signed)
Spoke to patient's husband on DPR - he is aware of results and will provide them to the patient.  He thanked me for the call and told me they are aware of her kidney function. She sees her nephrologist regularly.

## 2017-05-02 ENCOUNTER — Ambulatory Visit
Admission: RE | Admit: 2017-05-02 | Discharge: 2017-05-02 | Disposition: A | Discharge status: Home / Self Care | Payer: Medicare Other | Source: Ambulatory Visit | Attending: Neurology | Admitting: Neurology

## 2017-05-02 DIAGNOSIS — R413 Other amnesia: Secondary | ICD-10-CM

## 2017-05-02 MED FILL — CETIRIZINE HCL 10 MG TABS: 10 | 100 days supply | Qty: 100 | Fill #0

## 2017-05-04 ENCOUNTER — Telehealth: Payer: Self-pay | Admitting: Neurology

## 2017-05-04 NOTE — Telephone Encounter (Signed)
Please call patient, MRI of the brain showed moderate perisylvian atrophy, supratentorium small vessel disease, no acute abnormality, I will review films with her at next visit  Abnormal MRI brain (without) demonstrating: 1. Moderate perisylvian atrophy.  2. Moderate periventricular and subcortical chronic small vessel ischemic disease.  3. No acute findings. 4. Compared to MRI on 07/06/03, there has been progression of atrophy and chronic small vessel ischemic disease.

## 2017-05-07 NOTE — Telephone Encounter (Signed)
Spoke to patient - she is aware of results and will keep her pending appt for further discussion.

## 2017-05-07 NOTE — Telephone Encounter (Signed)
Left message requesting a return call.

## 2017-05-08 MED FILL — MIRTAZAPINE 7.5 MG TABLET: 7.5 | 30 days supply | Qty: 30 | Fill #2

## 2017-05-08 MED FILL — LEVOTHYROXINE 50 MCG TABLET: 50 | 90 days supply | Qty: 90 | Fill #0

## 2017-05-15 ENCOUNTER — Telehealth: Payer: Self-pay | Admitting: *Deleted

## 2017-05-15 NOTE — Telephone Encounter (Signed)
Left message on both home and mobile numbers.  Her NCV/EMG was canceled due to weather.  It has been rescheduled for 06/06/17.  Arrival time at 7:45am - NCV 8:15am - EMG 9am.  I provided our number to call back if this new time is not convenient and apologized for the inconvenience.

## 2017-05-16 ENCOUNTER — Encounter: Payer: Medicare Other | Admitting: Neurology

## 2017-05-23 MED FILL — SULFAMETHOXAZOLE-TMP SS TAB: 400-80 | 28 days supply | Qty: 12 | Fill #1

## 2017-05-23 MED FILL — predniSONE 5 MG TABS: 5 | 30 days supply | Qty: 30 | Fill #5

## 2017-05-23 MED FILL — TACROLIMUS 1 MG CAPSULE: 1 | 30 days supply | Qty: 60 | Fill #5

## 2017-05-23 MED FILL — TACROLIMUS 0.5 MG CAPSULE: 0.5 | 30 days supply | Qty: 60 | Fill #5

## 2017-06-06 ENCOUNTER — Ambulatory Visit: Payer: Medicare Other | Admitting: Neurology

## 2017-06-06 ENCOUNTER — Ambulatory Visit (INDEPENDENT_AMBULATORY_CARE_PROVIDER_SITE_OTHER): Payer: Medicare Other | Admitting: Neurology

## 2017-06-06 DIAGNOSIS — G63 Polyneuropathy in diseases classified elsewhere: Secondary | ICD-10-CM

## 2017-06-06 DIAGNOSIS — R413 Other amnesia: Secondary | ICD-10-CM

## 2017-06-06 DIAGNOSIS — T8610 Unspecified complication of kidney transplant: Secondary | ICD-10-CM

## 2017-06-06 DIAGNOSIS — G3184 Mild cognitive impairment, so stated: Secondary | ICD-10-CM | POA: Diagnosis not present

## 2017-06-06 DIAGNOSIS — G629 Polyneuropathy, unspecified: Secondary | ICD-10-CM

## 2017-06-06 MED ORDER — MEMANTINE HCL 10 MG PO TABS: 10.0000 mg | ORAL_TABLET | Freq: Two times a day (BID) | ORAL | 11 refills | Status: DC

## 2017-06-06 MED FILL — MEMANTINE HCL 10 MG TABLET: 10 | 30 days supply | Qty: 60 | Fill #0

## 2017-06-06 NOTE — Patient Instructions (Signed)
Please return to clinic with your husband at next visit in 6 months  Add on new medication Namenda 10 mg 1 tablet twice a day  Laboratory evaluation today for evaluation of peripheral neuropathy,  Evidence of orthostatic blood pressure drop, peripheral neuropathy  You should wear tight stocking, increase water intake, shifted weight between legs whenyou first standing up to avoid blood pressure drop

## 2017-06-06 NOTE — Progress Notes (Signed)
PATIENT: [name] DOB: 25-Nov-1931  No chief complaint on file.    HISTORICAL  ANAIKA SANTILLANO is a 84 year old female, seen in refer by her primary care doctor Deland Pretty, for evaluation of memory loss, dizziness, initial evaluation was on April 23, 2017.  I reviewed and summarized the referring note, she has past medical history of hypothyroidism, on supplement, depression anxiety, breast cancer, hyperlipidemia, hypertension, osteopenia, kidney transplant secondary to chronic kidney disease in 2012, iron deficiency anemia,  Patient is alone at today's clinical visit, was noted to have a hesitation about providing detailed history, she is a retired Designer, multimedia at age 41, currently lives at [facility] assisted [facility] with her husband, she drove to office today,  She reported significant family history of memory loss, father died of dementia, her elderly sister at age 51 also suffered significant memory loss, she noted gradual onset memory loss since 2018, she has word finding difficulties, today's Mini-Mental status examination 26/30, she missed 3 out of 3 recalls.  She also complains of dizziness for a few years, she has transient lightheadedness with sudden positional change, such as quick body movement, getting up quickly from sitting down position.  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  We have reviewed MRI of the brain in January 2019, there was evidence of significant prior survey atrophy, moderate supratentorium small vessel disease, compared to MRI in 2005, there has been progression of atrophy and chronic small vessel disease.  She was noted to have mild memory loss, difficulty providing detailed history, laboratory evaluations in January 2019 showed mild abnormal creatinine 1.36, GFR 36, normal RPR, B12, vitamin D, CBC, TSH  REVIEW OF  SYSTEMS: Full 14 system review of systems performed and notable only for as above  ALLERGIES: Allergies  Allergen Reactions  . Augmentin [Amoxicillin-Pot Clavulanate] Nausea And Vomiting  . Doxycycline Nausea And Vomiting  . Keflex [Cephalexin] Hives  . Minocycline Hives and Other (See Comments)    Gave bladder infection     HOME MEDICATIONS: Current Outpatient Medications  Medication Sig Dispense Refill  . Acetaminophen (TYLENOL PO) Take by mouth as needed.    Marland Kitchen alendronate (FOSAMAX) 70 MG tablet Take 70 mg by mouth every 7 (seven) days. On Saturdays. Take with a full glass of water on an empty stomach.    Marland Kitchen amitriptyline (ELAVIL) 25 MG tablet Take by mouth.    . Calcium Carbonate (CALCIUM 600 PO) Take 1 tablet by mouth 2 (two) times daily.    . cholecalciferol (VITAMIN D) 1000 UNITS tablet Take 2,000 Units by mouth daily.     Marland Kitchen denosumab (PROLIA) 60 MG/ML SOLN injection Inject 60 mg into the skin every 6 (six) months. Administer in upper arm, thigh, or abdomen    . ferrous sulfate 325 (65 FE) MG tablet Take 1 tablet (325 mg total) by mouth 3 (three) times daily after meals.  3  . levothyroxine (SYNTHROID, LEVOTHROID) 50 MCG tablet Take 50 mcg by mouth daily.    . MECLIZINE HCL PO Take by mouth as needed.    . predniSONE (DELTASONE) 5 MG tablet Take 5 mg by mouth as needed.     . sulfamethoxazole-trimethoprim (BACTRIM,SEPTRA) 400-80 MG per tablet Take 1 tablet by mouth daily. 30 tablet 11  . tacrolimus (PROGRAF) 1 MG capsule Take 1.5 mg by mouth 2 (two) times daily.      No current facility-administered medications  for this visit.     PAST MEDICAL HISTORY: Past Medical History:  Diagnosis Date  . Arthritis   . Cancer ([place]) 1988   BREAST CANCER- MASTECTOMY RIGHT - NO CHEMO NO RADIATION  . CMV (cytomegalovirus infection) ([place])   . Depression with anxiety   . Dizziness   . Gout   . History of breast cancer   . Hypertension   . Hypothyroidism   . Iron deficiency anemia   .  Memory loss   . Osteoarthritis   . Osteopenia   . Patella fracture   . Peripheral neuropathy   . Raynauds phenomenon   . Renal disorder    HX OF KIDNEY TRANSPLANT - STATES KIDNEY FUNCTION OK -DR. SANDFORD / DR. FOX -   [facility]  . Sinus problem    HX OF SINUS INFECTIONS   . Thyroid disease   . Vitamin D deficiency   . Wolff-Parkinson-White (WPW) syndrome     PAST SURGICAL HISTORY: Past Surgical History:  Procedure Laterality Date  . ABDOMINAL HYSTERECTOMY    . BREAST SURGERY     RIGHT MASTECTOMY AND AXILLARY NODE DISSECTION  . JOINT REPLACEMENT     LEFT TOTAL KNEE REPLACEMENT   . KIDNEY TRANSPLANT  march 2012   right side - surgery at [place]  . ORIF PATELLA Right 07/22/2013   Procedure: OPEN REDUCTION INTERNAL (ORIF) FIXATION PATELLA;  Surgeon: Mauri Pole, MD;  Location: [facility];  Service: Orthopedics;  Laterality: Right;  . SINUS SURGERY WITH INSTATRAK      FAMILY HISTORY: Family History  Problem Relation Age of Onset  . Other Mother        unsure of history   . Dementia Father   . Dementia Sister   . Cancer Brother        lung    SOCIAL HISTORY:  Social History   Socioeconomic History  . Marital status: Married    Spouse name: Not on file  . Number of children: 2  . Years of education: college  . Highest education level: Bachelor's degree (e.g., BA, AB, BS)  Social Needs  . Financial resource strain: Not on file  . Food insecurity - worry: Not on file  . Food insecurity - inability: Not on file  . Transportation needs - medical: Not on file  . Transportation needs - non-medical: Not on file  Occupational History  . Occupation: Retired  Tobacco Use  . Smoking status: Never Smoker  . Smokeless tobacco: Never Used  Substance and Sexual Activity  . Alcohol use: Yes    Comment: MAYBE ONE GLASS OF WINE EVERY FEW MONTHS  . Drug use: No  . Sexual activity: Not on file  Other Topics Concern  . Not on file  Social History  Narrative   Lives at home with husband at [facility].   Right-handed.   Occasional caffeine use.     PHYSICAL EXAM   There were no vitals filed for this visit.  Not recorded      There is no height or weight on file to calculate BMI.  PHYSICAL EXAMNIATION:  Gen: NAD, conversant, well nourised, obese, well groomed                     Cardiovascular: Regular rate rhythm, no peripheral edema, warm, nontender. Eyes: Conjunctivae clear without exudates or hemorrhage Neck: Supple, no carotid bruits. Pulmonary: Clear to auscultation bilaterally   NEUROLOGICAL EXAM:  MENTAL STATUS: Speech:  Speech is normal; fluent and spontaneous with normal comprehension.  Cognition:     Orientation to time, place and person     Normal recent and remote memory     Normal Attention span and concentration     Normal Language, naming, repeating,spontaneous speech     Fund of knowledge   CRANIAL NERVES: CN II: Visual fields are full to confrontation. Fundoscopic exam is normal with sharp discs and no vascular changes. Pupils are round equal and briskly reactive to light. CN III, IV, VI: extraocular movement are normal. No ptosis. CN V: Facial sensation is intact to pinprick in all 3 divisions bilaterally. Corneal responses are intact.  CN VII: Face is symmetric with normal eye closure and smile. CN VIII: Hearing is normal to rubbing fingers CN IX, X: Palate elevates symmetrically. Phonation is normal. CN XI: Head turning and shoulder shrug are intact CN XII: Tongue is midline with normal movements and no atrophy.  MOTOR: There is no pronator drift of out-stretched arms. Muscle bulk and tone are normal. Muscle strength is normal.  REFLEXES: Reflexes are 2+ and symmetric at the biceps, triceps, knees, and absent at ankles plantar responses are flexor.  SENSORY: Intact to light touch, pinprick, decreased vibratory sensation at toes  COORDINATION: Rapid alternating movements and fine  finger movements are intact. There is no dysmetria on finger-to-nose and heel-knee-shin.    GAIT/STANCE: Posture is normal. Gait is steady with normal steps, base, arm swing, and turning. Heel and toe walking are normal. Tandem gait is normal.  Romberg is absent.   DIAGNOSTIC DATA (LABS, IMAGING, TESTING) - I reviewed patient records, labs, notes, testing and imaging myself where available.   ASSESSMENT AND PLAN  CAROLA VIRAMONTES is a 84 y.o. 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 Ph.D.  [facility] [address], [place], [place] [address] Ph: [phone] Fax: [phone]  [id number], Thayer Jew, MD

## 2017-06-07 NOTE — Procedures (Signed)
Full Name: [name] Gender: Female MRN #: [id number] Date of Birth: [birth year]    Visit Date: 06/06/17 08:15 Age: 84 Years 82 Months Old Examining Physician: Marcial Pacas, MD  Referring Physician: Krista Blue, MD History: 84 year old female complains of bilateral feet paresthesia, orthostatic dizziness,  Summary of the tests: Nerve conduction study: Bilateral superficial peroneal sensory responses were absent.  Bilateral sural responses showed mildly prolonged peak latency was with moderately decreased snap amplitude.  Left tibial motor responses showed mildly prolonged distal latency, with mildly decreased conduction velocity, with preserved C map amplitude.  Right tibial and bilateral peroneal motor responses also showed mildly decreased conduction velocity.  Left peroneal motor responses showed moderately decreased the C map amplitude, right peroneal to EDB motor response showed normal C map amplitude.  Right radial sensory responses showed mildly prolonged peak latency was mildly decreased snap amplitude; right ulnar sensory responses showed mildly prolonged peak latency with normal snap amplitude.  Right ulnar motor responses showed mildly prolonged distal latency, slight slow conduction velocity with well-preserved C map amplitude  Electromyography: Selective needle examinations were performed at right lower extremity muscles.  There is chronic neuropathic changes involving right extensor digitorum brevis.   Conclusion: This is an abnormal study.  There is electrodiagnostic evidence of mild axonal, length-dependent, sensory more than motor polyneuropathy.    ------------------------------- Marcial Pacas, M.D.  [facility] [place], [place] [address] Tel: [phone] Fax: [phone]        [facility]    Nerve / Sites Muscle Latency Ref. Amplitude Ref. Rel Amp Segments Distance Velocity Ref. Area    ms ms mV mV %  cm m/s m/s mVms  R Ulnar - ADM     Wrist ADM 3.6 ?3.3 10.1 ?6.0 100 Wrist - ADM 7   35.9     B.Elbow ADM 7.4  8.8  87 B.Elbow - Wrist 18 47 ?49 33.2     A.Elbow ADM 9.5  9.0  102 A.Elbow - B.Elbow 10 48 ?49 34.8         A.Elbow - Wrist      R Peroneal - EDB     Ankle EDB 5.7 ?6.5 3.0 ?2.0 100 Ankle - EDB 9   12.4     Fib head EDB 12.3  2.7  91.2 Fib head - Ankle 28 42 ?44 12.7     Pop fossa EDB 14.8  3.0  112 Pop fossa - Fib head 10 41 ?44 15.2         Pop fossa - Ankle      L Peroneal - EDB     Ankle EDB 4.9 ?6.5 1.0 ?2.0 100 Ankle - EDB 9   4.4     Fib head EDB 11.9  0.7  66.3 Fib head - Ankle 28 40 ?44 3.1     Pop fossa EDB 14.5  0.7  99.3 Pop fossa - Fib head 10 39 ?44 3.2         Pop fossa - Ankle      R Tibial - AH     Ankle AH 5.5 ?5.8 4.8 ?4.0 100 Ankle - AH 9   16.9     Pop fossa AH 14.9  4.0  84.5 Pop fossa - Ankle 34 36 ?41 17.1  L Tibial - AH     Ankle AH 6.0 ?5.8 6.3 ?4.0 100 Ankle - AH 9   15.8     Pop fossa AH 15.9  4.2  66.6 Pop fossa - Ankle 34 34 ?41 13.2               SNC    Nerve / Sites Rec. Site Peak Lat Ref.  Amp Ref. Segments Distance    ms ms V V  cm  R Radial - Anatomical snuff box (Forearm)     Forearm Wrist 3.3 ?2.9 8 ?15 Forearm - Wrist 10  R Sural - Ankle (Calf)     Calf Ankle 4.9 ?4.4 3 ?6 Calf - Ankle 14  L Sural - Ankle (Calf)     Calf Ankle 4.9 ?4.4 3 ?6 Calf - Ankle 14  L Superficial peroneal - Ankle     Lat leg Ankle NR ?4.4 NR ?6 Lat leg - Ankle 14  R Superficial peroneal - Ankle     Lat leg Ankle NR ?4.4 NR ?6 Lat leg - Ankle 14  R Ulnar - Orthodromic, (Dig V, Mid palm)     Dig V Wrist 3.4 ?3.1 8 ?5 Dig V - Wrist 40                 F  Wave    Nerve F Lat Ref.   ms ms  R Tibial - AH 61.6 ?56.0  L Tibial - AH 60.4 ?56.0  R Ulnar - ADM 30.9 ?32.0           EMG full       EMG Summary Table    Spontaneous MUAP Recruitment  Muscle IA Fib PSW Fasc Other Amp Dur. Poly Pattern  R. Tibialis anterior Normal None None None _______ Normal Normal Normal Normal  R.  Tibialis posterior Normal None None None _______ Normal Normal Normal Normal  R. Peroneus longus Normal None None None _______ Normal Normal Normal Normal  R. Gastrocnemius (Medial head) Normal None None None _______ Normal Normal Normal Normal  R. Extensor digitorum brevis Normal None None None _______ Normal Increased Normal Reduced  R. Vastus lateralis Normal None None None _______ Normal Normal Normal Normal

## 2017-06-08 LAB — PROTEIN ELECTROPHORESIS
A/G RATIO SPE: 1.8 — AB (ref 0.7–1.7)
Albumin ELP: 4.5 g/dL — ABNORMAL HIGH (ref 2.9–4.4)
Alpha 1: 0.2 g/dL (ref 0.0–0.4)
Alpha 2: 0.7 g/dL (ref 0.4–1.0)
Beta: 0.8 g/dL (ref 0.7–1.3)
GLOBULIN, TOTAL: 2.5 g/dL (ref 2.2–3.9)
Gamma Globulin: 0.8 g/dL (ref 0.4–1.8)
TOTAL PROTEIN: 7 g/dL (ref 6.0–8.5)

## 2017-06-08 LAB — FERRITIN: FERRITIN: 124 ng/mL (ref 15–150)

## 2017-06-08 LAB — ANA W/REFLEX IF POSITIVE: ANA: NEGATIVE

## 2017-06-08 LAB — HGB A1C W/O EAG: HEMOGLOBIN A1C: 5.4 % (ref 4.8–5.6)

## 2017-06-08 LAB — COPPER, SERUM: Copper: 108 ug/dL (ref 72–166)

## 2017-06-08 LAB — TSH: TSH: 0.909 u[IU]/mL (ref 0.450–4.500)

## 2017-06-11 ENCOUNTER — Telehealth: Payer: Self-pay | Admitting: *Deleted

## 2017-06-11 NOTE — Telephone Encounter (Signed)
Left patient a detailed message, with results, on voicemail (ok per DPR).  Provided our number to call back with any questions.  

## 2017-06-11 NOTE — Telephone Encounter (Signed)
-----   Message from Marcial Pacas, MD sent at 06/11/2017  8:17 AM EST ----- Please call patient for no significant abnormality on lab evaluations

## 2017-06-20 MED FILL — AMITRIPTYLINE HCL 25 MG TAB: 25 | 90 days supply | Qty: 90 | Fill #0

## 2017-06-20 MED FILL — predniSONE 5 MG TABS: 5 | 30 days supply | Qty: 30 | Fill #6

## 2017-06-20 MED FILL — MIRTAZAPINE 7.5 MG TABLET: 7.5 | 30 days supply | Qty: 30 | Fill #3

## 2017-06-28 MED FILL — TACROLIMUS 1 MG CAPSULE: 1 | 30 days supply | Qty: 60 | Fill #6

## 2017-06-28 MED FILL — TACROLIMUS 0.5 MG CAPSULE: 0.5 | 30 days supply | Qty: 60 | Fill #6

## 2017-06-28 MED FILL — SULFAMETHOXAZOLE-TMP SS TAB: 400-80 | 28 days supply | Qty: 12 | Fill #2

## 2017-07-10 MED FILL — MEMANTINE HCL 10 MG TABLET: 10 | 30 days supply | Qty: 60 | Fill #1

## 2017-07-24 ENCOUNTER — Telehealth: Payer: Self-pay | Admitting: Neurology

## 2017-07-24 MED FILL — MIRTAZAPINE 7.5 MG TABLET: 7.5 | 30 days supply | Qty: 30 | Fill #4

## 2017-07-24 MED FILL — predniSONE 5 MG TABS: 5 | 30 days supply | Qty: 30 | Fill #7

## 2017-07-24 MED FILL — SULFAMETHOXAZOLE-TMP SS TAB: 400-80 | 28 days supply | Qty: 12 | Fill #3

## 2017-07-24 MED FILL — TACROLIMUS 0.5 MG CAPSULE: 0.5 | 30 days supply | Qty: 60 | Fill #7

## 2017-07-24 MED FILL — TACROLIMUS 1 MG CAPSULE: 1 | 30 days supply | Qty: 60 | Fill #7

## 2017-07-24 NOTE — Telephone Encounter (Signed)
Pt calls to report she gets a foggy feeling and then will black out. Sometimes she realizes it coming on and can brace herself. Thes does not happen when getting up from a seated position. This has been happening less than a month. Pt's husband recommended she see neurologist, pt is wondering if she should see PCP. Please call to advise

## 2017-07-24 NOTE — Telephone Encounter (Signed)
Spoke to patient - she has continued to have lightheadedness, dizziness and then blacks out.  She has experienced four episodes over the last week.  Per Dr. Krista Blue, she would like her seen this week by NP.  She will need to bring her husband to this appointment due to her declining memory.  She has been scheduled with Megan on 07/26/17.  I have provided the appt time to the patient and her husband (2pm - arrive at 1:30pm).

## 2017-07-26 ENCOUNTER — Encounter: Payer: Self-pay | Admitting: Adult Health

## 2017-07-26 ENCOUNTER — Ambulatory Visit: Payer: Medicare Other | Admitting: Adult Health

## 2017-07-26 VITALS — BP 138/82 | HR 76 | Ht 66.0 in | Wt 113.8 lb

## 2017-07-26 DIAGNOSIS — G63 Polyneuropathy in diseases classified elsewhere: Secondary | ICD-10-CM | POA: Diagnosis not present

## 2017-07-26 DIAGNOSIS — R55 Syncope and collapse: Secondary | ICD-10-CM | POA: Diagnosis not present

## 2017-07-26 DIAGNOSIS — R413 Other amnesia: Secondary | ICD-10-CM

## 2017-07-26 NOTE — Patient Instructions (Addendum)
Your Plan:  Memory score is stable- may consider neuropsychological testing in the future EEG ordered If your symptoms worsen or you develop new symptoms please let us know.   Thank you for coming to see [place] at [facility]. I hope we have been able to provide you high quality care today.  You may receive a patient satisfaction survey over the next few weeks. We would appreciate your feedback and comments so that we may continue to improve ourselves and the health of our patients.

## 2017-07-26 NOTE — Progress Notes (Signed)
I have reviewed and agreed above plan. 

## 2017-07-26 NOTE — Progress Notes (Signed)
PATIENT: [name] DOB: [birth year]  REASON FOR VISIT: follow up HISTORY FROM: patient  HISTORY OF PRESENT ILLNESS: Today 07/26/17 [name] is an 84 year old female with a history of memory disturbance and dizziness.  She returns today for follow-up.  The patient had an MRI of the brain that showed atrophy as well as an EEG that showed least dependent sensory changes.  The patient states that she had episodes of blacking out.  She states that the most recent episode was Tuesday of this week.  She states that she was standing in her kitchen and went to the garage door and she felt her body crumpled to the floor.  She feels that there was a brief loss of consciousness.  Her husband states that Cynthia did not witness this but did find her on the floor when she fell.  She states that she did hit her neck.  Reports that her neck has been sore but denies any significant discomfort down the arms.  No numbness or tingling down the arms.  She states that she has had "several black outs."  Patient does not remember events leading up to the black outs. Events have been unwitnessed by husband. Cynthia reports that directly after when Cynthia questions her about the events she does not remember much. Patient denies any dizziness during these events.  She does notice some changes with her gait and balance.  Reports that she feels that she has been more cautious when ambulating.  She denies any significant discomfort in the lower extremities.  Denies any burning or tingling or numbness.  She feels that her memory has gotten worse.  She is able to complete all ADLs independently.  She operates a Teacher, music without difficulty.  She is able to manage her own finances.  She states that there has been changes in her mood or behavior.  Reports that she is not as outgoing as she used to be.  Reports that she does go to a book club but often she is unable to participate in a conversation because she cannot remember what Cynthia read.   Patient denies any changes with the bowels or bladder.  She returns today for an evaluation.  HISTORY 04/23/17: [name] is a 84 year old female, seen in refer by her primary care doctor [name], for evaluation of memory loss, dizziness, initial evaluation was on April 23, 2017.  I reviewed and summarized the referring note, she has past medical history of hypothyroidism, on supplement, depression anxiety, breast cancer, hyperlipidemia, hypertension, osteopenia, kidney transplant secondary to chronic kidney disease in 2012, iron deficiency anemia,  Patient is alone at today's clinical visit, was noted to have a hesitation about providing detailed history, she is a retired Designer, multimedia at age 79, currently lives at [facility] assisted living with her husband, she drove to office today,  She reported significant family history of memory loss, father died of dementia, her elderly sister at age 69 also suffered significant memory loss, she noted gradual onset memory loss since 2018, she has word finding difficulties, today's Mini-Mental status examination 26/30, she missed 3 out of 3 recalls.  She also complains of dizziness for a few years, she has transient lightheadedness with sudden positional change, such as quick body movement, getting up quickly from sitting down position.    REVIEW OF SYSTEMS: Out of a complete 14 system review of symptoms, the patient complains only of the following symptoms, and all other reviewed systems are negative.  Fatigue, unexpected weight change, double vision, memory loss, dizziness, weakness, passing out, decreased concentration, depression, bruise/bleed easily  ALLERGIES: Allergies  Allergen Reactions  . Cephalexin Hives  . Augmentin [Amoxicillin-Pot Clavulanate] Nausea And Vomiting  . Doxycycline Nausea And Vomiting  . Minocycline Hives and Other (See Comments)    Gave bladder infection     HOME MEDICATIONS: Outpatient  Medications Prior to Visit  Medication Sig Dispense Refill  . Acetaminophen (TYLENOL PO) Take by mouth as needed.    Marland Kitchen alendronate (FOSAMAX) 70 MG tablet Take 70 mg by mouth every 7 (seven) days.    Marland Kitchen amitriptyline (ELAVIL) 25 MG tablet Take by mouth.    . Calcium Carbonate (CALCIUM 600 PO) Take 1 tablet by mouth 2 (two) times daily.    . cholecalciferol (VITAMIN D) 1000 UNITS tablet Take 2,000 Units by mouth daily.     Marland Kitchen denosumab (PROLIA) 60 MG/ML SOLN injection Inject 60 mg into the skin every 6 (six) months. Administer in upper arm, thigh, or abdomen    . levothyroxine (SYNTHROID, LEVOTHROID) 50 MCG tablet Take 50 mcg by mouth daily.    . MECLIZINE HCL PO Take by mouth as needed.    . predniSONE (DELTASONE) 5 MG tablet Take 5 mg by mouth as needed.     . sulfamethoxazole-trimethoprim (BACTRIM,SEPTRA) 400-80 MG per tablet Take 1 tablet by mouth daily. 30 tablet 11  . tacrolimus (PROGRAF) 1 MG capsule Take 1.5 mg by mouth 2 (two) times daily.     . memantine (NAMENDA) 10 MG tablet Take 1 tablet (10 mg total) by mouth 2 (two) times daily. 60 tablet 11  . alendronate (FOSAMAX) 70 MG tablet Take 70 mg by mouth every 7 (seven) days. On Saturdays. Take with a full glass of water on an empty stomach.    . ferrous sulfate 325 (65 FE) MG tablet Take 1 tablet (325 mg total) by mouth 3 (three) times daily after meals.  3   No facility-administered medications prior to visit.     PAST MEDICAL HISTORY: Past Medical History:  Diagnosis Date  . Arthritis   . Cancer ([place]) 1988   BREAST CANCER- MASTECTOMY RIGHT - NO CHEMO NO RADIATION  . CMV (cytomegalovirus infection) ([place])   . Depression with anxiety   . Dizziness   . Gout   . History of breast cancer   . Hypertension   . Hypothyroidism   . Iron deficiency anemia   . Memory loss   . Osteoarthritis   . Osteopenia   . Patella fracture   . Peripheral neuropathy   . Raynauds phenomenon   . Renal disorder    HX OF KIDNEY TRANSPLANT - STATES  KIDNEY FUNCTION OK -DR. SANDFORD / DR. FOX -   [facility]  . Sinus problem    HX OF SINUS INFECTIONS   . Thyroid disease   . Vitamin D deficiency   . Wolff-Parkinson-White (WPW) syndrome     PAST SURGICAL HISTORY: Past Surgical History:  Procedure Laterality Date  . ABDOMINAL HYSTERECTOMY    . BREAST SURGERY     RIGHT MASTECTOMY AND AXILLARY NODE DISSECTION  . JOINT REPLACEMENT     LEFT TOTAL KNEE REPLACEMENT   . KIDNEY TRANSPLANT  march 2012   right side - surgery at [place]  . ORIF PATELLA Right 07/22/2013   Procedure: OPEN REDUCTION INTERNAL (ORIF) FIXATION PATELLA;  Surgeon: Mauri Pole, MD;  Location: [facility];  Service: Orthopedics;  Laterality: Right;  . SINUS  SURGERY WITH INSTATRAK      FAMILY HISTORY: Family History  Problem Relation Age of Onset  . Other Mother        unsure of history   . Dementia Father   . Dementia Sister   . Cancer Brother        lung    SOCIAL HISTORY: Social History   Socioeconomic History  . Marital status: Married    Spouse name: Not on file  . Number of children: 2  . Years of education: college  . Highest education level: Bachelor's degree (e.g., BA, AB, BS)  Occupational History  . Occupation: Retired  Scientific laboratory technician  . Financial resource strain: Not on file  . Food insecurity:    Worry: Not on file    Inability: Not on file  . Transportation needs:    Medical: Not on file    Non-medical: Not on file  Tobacco Use  . Smoking status: Never Smoker  . Smokeless tobacco: Never Used  Substance and Sexual Activity  . Alcohol use: Yes    Comment: MAYBE ONE GLASS OF WINE EVERY FEW MONTHS  . Drug use: No  . Sexual activity: Not on file  Lifestyle  . Physical activity:    Days per week: Not on file    Minutes per session: Not on file  . Stress: Not on file  Relationships  . Social connections:    Talks on phone: Not on file    Gets together: Not on file    Attends religious service: Not on file     Active member of club or organization: Not on file    Attends meetings of clubs or organizations: Not on file    Relationship status: Not on file  . Intimate partner violence:    Fear of current or ex partner: Not on file    Emotionally abused: Not on file    Physically abused: Not on file    Forced sexual activity: Not on file  Other Topics Concern  . Not on file  Social History Narrative   Lives at home with [facility] at [facility].   Right-handed.   Occasional caffeine use.      PHYSICAL EXAM  Vitals:   07/26/17 1339  BP: 138/82  Pulse: 76  Weight: 113 lb 12.8 oz (51.6 kg)  Height: 5\' 6"  (1.676 m)   Body mass index is 18.37 kg/m.   Orthostatic VS for the past 24 hrs:  BP- Lying Pulse- Lying BP- Sitting Pulse- Sitting BP- Standing at 0 minutes Pulse- Standing at 0 minutes  07/26/17 1500 (!) 144/94 84 138/82 76 138/84 78      MMSE - Mini Mental State Exam 04/23/2017  Orientation to time 4  Orientation to Place 5  Registration 3  Attention/ Calculation 5  Recall 0  Language- name 2 objects 2  Language- repeat 1  Language- follow 3 step command 3  Language- read & follow direction 1  Write a sentence 1  Copy design 1  Total score 26     Generalized: Well developed, in no acute distress   Neurological examination  Mentation: Alert oriented to time, place, history taking. Follows all commands speech and language fluent Cranial nerve II-XII: Pupils were equal round reactive to light. Extraocular movements were full, visual field were full on confrontational test. Facial sensation and strength were normal. Uvula tongue midline. Head turning and shoulder shrug  were normal and symmetric. Motor: The motor testing reveals 5 over 5  strength of all 4 extremities. Good symmetric motor tone is noted throughout.  Sensory: Sensory testing is intact to soft touch on all 4 extremities.  Pinprick sensation decreased in the lower extremities in a stocking-like pattern.   Vibration sensation intact.  No evidence of extinction is noted.  Coordination: Cerebellar testing reveals good finger-nose-finger and heel-to-shin bilaterally.  Gait and station: Gait is normal. Tandem gait slightly unsteady.  Romberg is negative. No drift is seen.  Reflexes: Deep tendon reflexes are symmetric and normal bilaterally.   DIAGNOSTIC DATA (LABS, IMAGING, TESTING) - I reviewed patient records, labs, notes, testing and imaging myself where available.  Lab Results  Component Value Date   WBC 3.5 04/23/2017   HGB 14.8 04/23/2017   HCT 43.4 04/23/2017   MCV 92 04/23/2017   PLT 182 04/23/2017      Component Value Date/Time   NA 138 04/23/2017 1050   K 5.0 04/23/2017 1050   CL 99 04/23/2017 1050   CO2 23 04/23/2017 1050   GLUCOSE 89 04/23/2017 1050   GLUCOSE 116 (H) 07/23/2013 0433   BUN 26 04/23/2017 1050   CREATININE 1.36 (H) 04/23/2017 1050   CALCIUM 9.0 04/23/2017 1050   PROT 7.0 06/06/2017 0957   ALBUMIN 4.5 04/23/2017 1050   AST 23 04/23/2017 1050   ALT 16 04/23/2017 1050   ALKPHOS 87 04/23/2017 1050   BILITOT 0.3 04/23/2017 1050   GFRNONAA 36 (L) 04/23/2017 1050   GFRAA 42 (L) 04/23/2017 1050    Lab Results  Component Value Date   HGBA1C 5.4 06/06/2017   Lab Results  Component Value Date   VITAMINB12 613 04/23/2017   Lab Results  Component Value Date   TSH 0.909 06/06/2017      ASSESSMENT AND PLAN 84 y.o. year old female  has a past medical history of Arthritis, Cancer ([place]) (1988), CMV (cytomegalovirus infection) ([place]), Depression with anxiety, Dizziness, Gout, History of breast cancer, Hypertension, Hypothyroidism, Iron deficiency anemia, Memory loss, Osteoarthritis, Osteopenia, Patella fracture, Peripheral neuropathy, Raynauds phenomenon, Renal disorder, Sinus problem, Thyroid disease, Vitamin D deficiency, and Wolff-Parkinson-White (WPW) syndrome. here with:  1.  Memory disturbance 2.  Neuropathy-mild axonal sensory 3.  Syncopal event  The  patient's memory score has remained stable. In the future we may consider neuropsychological testing. We will continue to monitor.  I reviewed the patient's MRI results with her and her husband.  Her EMG results that show a mild axonal neuropathy.  The patient is having syncopal events.  We will do an EEG to rule out seizures.  Unsure if this is possibly related to axonal neuropathy. May consider a trial on mestinon in the future. No orthostatic blood pressure changes noted today.  I discussed with Dr. Krista Blue. Patient advised that if symptoms worsen or she develops new symptoms she should let us know. She will return in 3 months with Dr. Krista Blue.     Ward Givens, MSN, NP-C 07/26/2017, 2:05 PM [facility] [address] [place], [place] [address] [phone]

## 2017-08-03 ENCOUNTER — Emergency Department (HOSPITAL_BASED_OUTPATIENT_CLINIC_OR_DEPARTMENT_OTHER): Payer: Medicare Other

## 2017-08-03 ENCOUNTER — Other Ambulatory Visit: Payer: Self-pay

## 2017-08-03 ENCOUNTER — Emergency Department (HOSPITAL_BASED_OUTPATIENT_CLINIC_OR_DEPARTMENT_OTHER)
Admission: EM | Admit: 2017-08-03 | Discharge: 2017-08-03 | Disposition: A | Discharge status: Home / Self Care | Payer: Medicare Other | Attending: Emergency Medicine | Admitting: Emergency Medicine

## 2017-08-03 ENCOUNTER — Encounter (HOSPITAL_BASED_OUTPATIENT_CLINIC_OR_DEPARTMENT_OTHER): Payer: Self-pay | Admitting: *Deleted

## 2017-08-03 DIAGNOSIS — I456 Pre-excitation syndrome: Secondary | ICD-10-CM | POA: Diagnosis not present

## 2017-08-03 DIAGNOSIS — W19XXXA Unspecified fall, initial encounter: Secondary | ICD-10-CM | POA: Insufficient documentation

## 2017-08-03 DIAGNOSIS — M545 Low back pain: Secondary | ICD-10-CM | POA: Insufficient documentation

## 2017-08-03 DIAGNOSIS — R42 Dizziness and giddiness: Secondary | ICD-10-CM | POA: Diagnosis present

## 2017-08-03 DIAGNOSIS — Y9389 Activity, other specified: Secondary | ICD-10-CM | POA: Insufficient documentation

## 2017-08-03 DIAGNOSIS — I10 Essential (primary) hypertension: Secondary | ICD-10-CM | POA: Insufficient documentation

## 2017-08-03 DIAGNOSIS — Y999 Unspecified external cause status: Secondary | ICD-10-CM | POA: Insufficient documentation

## 2017-08-03 DIAGNOSIS — I951 Orthostatic hypotension: Secondary | ICD-10-CM | POA: Diagnosis not present

## 2017-08-03 DIAGNOSIS — Z940 Kidney transplant status: Secondary | ICD-10-CM | POA: Insufficient documentation

## 2017-08-03 DIAGNOSIS — E039 Hypothyroidism, unspecified: Secondary | ICD-10-CM | POA: Diagnosis not present

## 2017-08-03 DIAGNOSIS — Y9289 Other specified places as the place of occurrence of the external cause: Secondary | ICD-10-CM | POA: Diagnosis not present

## 2017-08-03 LAB — BASIC METABOLIC PANEL
Anion gap: 9 (ref 5–15)
BUN: 31 mg/dL — ABNORMAL HIGH (ref 6–20)
CO2: 23 mmol/L (ref 22–32)
Calcium: 9.1 mg/dL (ref 8.9–10.3)
Chloride: 101 mmol/L (ref 101–111)
Creatinine, Ser: 1.54 mg/dL — ABNORMAL HIGH (ref 0.44–1.00)
GFR calc Af Amer: 35 mL/min — ABNORMAL LOW (ref >60)
GFR calc non Af Amer: 30 mL/min — ABNORMAL LOW (ref >60)
Glucose, Bld: 98 mg/dL (ref 65–99)
Potassium: 4.2 mmol/L (ref 3.5–5.1)
Sodium: 133 mmol/L — ABNORMAL LOW (ref 135–145)

## 2017-08-03 LAB — URINALYSIS, ROUTINE W REFLEX MICROSCOPIC
Bilirubin Urine: NEGATIVE
Glucose, UA: NEGATIVE mg/dL
Hgb urine dipstick: NEGATIVE
Ketones, ur: NEGATIVE mg/dL
Nitrite: NEGATIVE
Protein, ur: NEGATIVE mg/dL
Specific Gravity, Urine: <1.005 — ABNORMAL LOW (ref 1.005–1.030)
pH: 6 (ref 5.0–8.0)

## 2017-08-03 LAB — CBC WITH DIFFERENTIAL/PLATELET
Basophils Absolute: 0 10*3/uL (ref 0.0–0.1)
Basophils Relative: 0 %
Eosinophils Absolute: 0.1 10*3/uL (ref 0.0–0.7)
Eosinophils Relative: 1 %
HCT: 42.4 % (ref 36.0–46.0)
Hemoglobin: 15.1 g/dL — ABNORMAL HIGH (ref 12.0–15.0)
Lymphocytes Relative: 18 %
Lymphs Abs: 1.1 10*3/uL (ref 0.7–4.0)
MCH: 32 pg (ref 26.0–34.0)
MCHC: 35.6 g/dL (ref 30.0–36.0)
MCV: 89.8 fL (ref 78.0–100.0)
Monocytes Absolute: 0.3 10*3/uL (ref 0.1–1.0)
Monocytes Relative: 4 %
Neutro Abs: 4.9 10*3/uL (ref 1.7–7.7)
Neutrophils Relative %: 77 %
Platelets: 173 10*3/uL (ref 150–400)
RBC: 4.72 MIL/uL (ref 3.87–5.11)
RDW: 14.3 % (ref 11.5–15.5)
WBC: 6.3 10*3/uL (ref 4.0–10.5)

## 2017-08-03 LAB — URINALYSIS, MICROSCOPIC (REFLEX): RBC / HPF: NONE SEEN RBC/hpf (ref 0–5)

## 2017-08-03 MED ORDER — SODIUM CHLORIDE 0.9 % IV BOLUS
1000.0000 mL | Freq: Once | INTRAVENOUS | Status: AC
  Administered 2017-08-03: 1000 mL via INTRAVENOUS

## 2017-08-03 MED ORDER — SODIUM CHLORIDE 0.9 % IV BOLUS
500.0000 mL | Freq: Once | INTRAVENOUS | Status: AC
  Administered 2017-08-03: 500 mL via INTRAVENOUS

## 2017-08-03 NOTE — ED Triage Notes (Addendum)
Pain in her right buttocks x 1 week. Pain started after a fall. She admits she may have fell due to syncope.

## 2017-08-03 NOTE — ED Notes (Signed)
Pt on auto VS and cardiac monitoring

## 2017-08-03 NOTE — ED Notes (Signed)
Unable to urinate at this time.  

## 2017-08-03 NOTE — Discharge Instructions (Signed)
Alternate with Tylenol and Aleve as prescribed over-the-counter.  Use ice and heat alternating 20 minutes on, 20 minutes off.  Increase your water intake at home.  Please follow-up with your doctor next week for recheck and further evaluation.  Please return to the emergency department if you develop any new or worsening symptoms.

## 2017-08-03 NOTE — ED Provider Notes (Signed)
[facility] DEPARTMENT Provider Note   CSN: [id number] Arrival date & time: 08/03/17  1135     History   Chief Complaint Chief Complaint  Patient presents with  . Fall    HPI [name] is a 84 y.o. female with history of breast cancer status post mastectomy, hypothyroidism, Wolff-Parkinson-White syndrome, history of kidney transplant, iron deficiency anemia who presents with low back pain and right hip pain after fall over 1 week ago.  Patient reports she felt lightheaded and blacked out.  She does not believe she hit her head.  She continues to have pain in her low back and right hip.  She has been taking Aleve at home without relief.  She has had associated fatigue for the past few days.  She denies any numbness or tingling in her legs.  She has had no loss of bowel or bladder control.  She denies any pain in her chest, shortness of breath, abdominal pain, nausea, vomiting. She is not on anticoagulation.  HPI  Past Medical History:  Diagnosis Date  . Arthritis   . Cancer ([place]) 1988   BREAST CANCER- MASTECTOMY RIGHT - NO CHEMO NO RADIATION  . CMV (cytomegalovirus infection) ([place])   . Depression with anxiety   . Dizziness   . Gout   . History of breast cancer   . Hypertension   . Hypothyroidism   . Iron deficiency anemia   . Memory loss   . Osteoarthritis   . Osteopenia   . Patella fracture   . Peripheral neuropathy   . Raynauds phenomenon   . Renal disorder    HX OF KIDNEY TRANSPLANT - STATES KIDNEY FUNCTION OK -DR. SANDFORD / DR. FOX -   [facility]  . Sinus problem    HX OF SINUS INFECTIONS   . Thyroid disease   . Vitamin D deficiency   . Wolff-Parkinson-White (WPW) syndrome     Patient Active Problem List   Diagnosis Date Noted  . Polyneuropathy associated with underlying disease ([place]) 06/06/2017  . Mild cognitive impairment 04/23/2017  . Dizziness 04/23/2017  . Expected blood loss anemia 07/23/2013  . S/P Right patella  ORIF 07/22/2013  . Renal transplant disorder 06/07/2012  . Unspecified hypothyroidism 06/07/2012  . Infection, atypical mycobacterium 06/07/2012    Past Surgical History:  Procedure Laterality Date  . ABDOMINAL HYSTERECTOMY    . BREAST SURGERY     RIGHT MASTECTOMY AND AXILLARY NODE DISSECTION  . JOINT REPLACEMENT     LEFT TOTAL KNEE REPLACEMENT   . KIDNEY TRANSPLANT  march 2012   right side - surgery at [place]  . ORIF PATELLA Right 07/22/2013   Procedure: OPEN REDUCTION INTERNAL (ORIF) FIXATION PATELLA;  Surgeon: Mauri Pole, MD;  Location: [facility];  Service: Orthopedics;  Laterality: Right;  . SINUS SURGERY WITH INSTATRAK       OB History   None      Home Medications    Prior to Admission medications   Medication Sig Start Date End Date Taking? Authorizing Provider  Acetaminophen (TYLENOL PO) Take by mouth as needed.    [provider]  alendronate (FOSAMAX) 70 MG tablet Take 70 mg by mouth every 7 (seven) days.    [provider]  amitriptyline (ELAVIL) 25 MG tablet Take by mouth. 04/28/14   [provider]  Calcium Carbonate (CALCIUM 600 PO) Take 1 tablet by mouth 2 (two) times daily.    [provider]  cholecalciferol (  VITAMIN D) 1000 UNITS tablet Take 2,000 Units by mouth daily.     [provider]  denosumab (PROLIA) 60 MG/ML SOLN injection Inject 60 mg into the skin every 6 (six) months. Administer in upper arm, thigh, or abdomen    [provider]  levothyroxine (SYNTHROID, LEVOTHROID) 50 MCG tablet Take 50 mcg by mouth daily.    [provider]  MECLIZINE HCL PO Take by mouth as needed.    [provider]  memantine (NAMENDA) 10 MG tablet Take 1 tablet (10 mg total) by mouth 2 (two) times daily. 06/06/17   Marcial Pacas, MD  predniSONE (DELTASONE) 5 MG tablet Take 5 mg by mouth as needed.     [provider]  sulfamethoxazole-trimethoprim (BACTRIM,SEPTRA) 400-80 MG per tablet  Take 1 tablet by mouth daily. 03/06/14   Campbell Riches, MD  tacrolimus (PROGRAF) 1 MG capsule Take 1.5 mg by mouth 2 (two) times daily.     [provider]    Family History Family History  Problem Relation Age of Onset  . Other Mother        unsure of history   . Dementia Father   . Dementia Sister   . Cancer Brother        lung    Social History Social History   Tobacco Use  . Smoking status: Never Smoker  . Smokeless tobacco: Never Used  Substance Use Topics  . Alcohol use: Yes    Comment: MAYBE ONE GLASS OF WINE EVERY FEW MONTHS  . Drug use: No     Allergies   Cephalexin; Augmentin [amoxicillin-pot clavulanate]; Doxycycline; and Minocycline   Review of Systems Review of Systems  Constitutional: Negative for chills and fever.  HENT: Negative for facial swelling and sore throat.   Respiratory: Negative for shortness of breath.   Cardiovascular: Negative for chest pain.  Gastrointestinal: Negative for abdominal pain, nausea and vomiting.  Genitourinary: Negative for dysuria.  Musculoskeletal: Positive for arthralgias, back pain and myalgias.  Skin: Negative for rash and wound.  Neurological: Positive for syncope and light-headedness. Negative for headaches.  Psychiatric/Behavioral: The patient is not nervous/anxious.      Physical Exam Updated Vital Signs BP (!) 157/84   Pulse 77   Temp 98.2 F (36.8 C) (Oral)   Resp 16   Ht 5\' 6"  (1.676 m)   Wt 51.3 kg (113 lb)   SpO2 100%   BMI 18.24 kg/m   Physical Exam  Constitutional: She appears well-developed and well-nourished. No distress.  HENT:  Head: Normocephalic and atraumatic.  Mouth/Throat: Oropharynx is clear and moist. No oropharyngeal exudate.  Eyes: Pupils are equal, round, and reactive to light. Conjunctivae are normal. Right eye exhibits no discharge. Left eye exhibits no discharge. No scleral icterus.  Neck: Normal range of motion. Neck supple. No thyromegaly present.    Cardiovascular: Normal rate, regular rhythm, normal heart sounds and intact distal pulses. Exam reveals no gallop and no friction rub.  No murmur heard. Pulmonary/Chest: Effort normal and breath sounds normal. No stridor. No respiratory distress. She has no wheezes. She has no rales.  Abdominal: Soft. Bowel sounds are normal. She exhibits no distension. There is no tenderness. There is no rebound and no guarding.  Musculoskeletal: She exhibits no edema.       Back:  Lymphadenopathy:    She has no cervical adenopathy.  Neurological: She is alert. Coordination normal.  5/5 strength to bilateral lower extremities, normal sensation  Skin: Skin is warm  and dry. No rash noted. She is not diaphoretic. No pallor.  Psychiatric: She has a normal mood and affect.  Nursing note and vitals reviewed.    ED Treatments / Results  Labs (all labs ordered are listed, but only abnormal results are displayed) Labs Reviewed  BASIC METABOLIC PANEL - Abnormal; Notable for the following components:      Result Value   Sodium 133 (*)    BUN 31 (*)    Creatinine, Ser 1.54 (*)    GFR calc non Af Amer 30 (*)    GFR calc Af Amer 35 (*)    All other components within normal limits  CBC WITH DIFFERENTIAL/PLATELET - Abnormal; Notable for the following components:   Hemoglobin 15.1 (*)    All other components within normal limits  URINALYSIS, ROUTINE W REFLEX MICROSCOPIC - Abnormal; Notable for the following components:   Specific Gravity, Urine <1.005 (*)    Leukocytes, UA TRACE (*)    All other components within normal limits  URINALYSIS, MICROSCOPIC (REFLEX) - Abnormal; Notable for the following components:   Bacteria, UA RARE (*)    Squamous Epithelial / LPF 0-5 (*)    All other components within normal limits    EKG EKG Interpretation  Date/Time:  Friday August 03 2017 12:12:04 EDT Ventricular Rate:  80 PR Interval:    QRS Duration: 87 QT Interval:  375 QTC Calculation: 433 R Axis:   93 Text  Interpretation:  Sinus rhythm Anterior infarct, old When compared to prior, no signifiacnt changes seen.  No STEMI Confirmed by Antony Blackbird [phone]) on 08/03/2017 1:02:23 PM   Radiology Dg Lumbar Spine Complete  Result Date: 08/03/2017 CLINICAL DATA:  Right low back and right hip pain after a fall 1 week ago. Initial encounter. EXAM: LUMBAR SPINE - COMPLETE 4+ VIEW COMPARISON:  CT abdomen and pelvis 09/16/2014. Abdominal radiographs 09/07/2014. FINDINGS: There are 5 non rib-bearing lumbar type vertebrae. Trace anterolisthesis of L4 on L5 is likely degenerative and facet mediated. No pars defects are identified. Vertebral body heights are preserved without evidence of acute fracture. Mild lower lumbar facet arthrosis is most notable on the right at L4-5 scratched of there is mild right-sided facet arthrosis at L4-5. Intervertebral disc space heights are preserved. Atherosclerotic vascular calcifications are noted including prominent splenic artery calcification. The appearance of the calcifications in the left upper quadrant raises concern for a splenic artery aneurysm both on today's study and on the prior abdominal radiographs, however no aneurysm was present on the intervening CT and the appearance is attributed to vessel tortuosity. IMPRESSION: 1. No acute osseous abnormality identified in the lumbar spine. 2. Trace anterolisthesis of L4 on L5 likely due to mild facet arthrosis. Electronically Signed   By: Logan Bores M.D.   On: 08/03/2017 13:18   Dg Hip Unilat W Or Wo Pelvis 2-3 Views Right  Result Date: 08/03/2017 CLINICAL DATA:  84 year old female with a history of fall 1 week ago EXAM: DG HIP (WITH OR WITHOUT PELVIS) 2-3V RIGHT COMPARISON:  None. FINDINGS: Bony pelvic ring appears intact. No acute displaced fracture. Degenerative changes of the bilateral hips. Mild vascular calcifications. Unremarkable appearance the proximal right femur. IMPRESSION: Negative for acute bony abnormality  Electronically Signed   By: Corrie Mckusick D.O.   On: 08/03/2017 13:13    Procedures Procedures (including critical care time)  Medications Ordered in ED Medications  sodium chloride 0.9 % bolus 500 mL (0 mLs Intravenous Stopped 08/03/17 1510)  sodium chloride 0.9 %  bolus 500 mL (0 mLs Intravenous Stopped 08/03/17 1558)  sodium chloride 0.9 % bolus 1,000 mL (0 mLs Intravenous Stopped 08/03/17 1816)     Initial Impression / Assessment and Plan / ED Course  I have reviewed the triage vital signs and the nursing notes.  Pertinent labs & imaging results that were available during my care of the patient were reviewed by me and considered in my medical decision making (see chart for details).  Clinical Course as of Aug 04 1942  Fri Aug 03, 2017  1520 I spoke with Dr. Hollie Salk at [facility] and advised her of the patient's increasing creatinine, however she states it does not require admission even in the case of patient's kidney transplant.  She agrees to giving patient a liter and plan for discharge home.   [AL]    Clinical Course User Index [AL] Frederica Kuster, PA-C    Patient presenting with low back pain after fall with syncopal episode over 1 week ago.  Patient found to be orthostatic in the ED, as well as hypertension.  Patient given 2 L of fluid and orthostatic hypotension resolved as well as lightheadedness with standing.  Patient's blood pressure also decreased without any other intervention besides fluid.  CBC shows hemoglobin 15.1.  BMP shows BUN 31, creatinine 1.56.  UA is negative for infection, trace leukocytes and rare bacteria.  I consulted nephrologist, Dr. Hollie Salk, who advised patient did not need to be admitted for increasing urine creatinine and that hydration and follow-up was sufficient.  Will discharge home with supportive treatment including ice, heat, continue Aleve and Tylenol for low back pain.  Follow-up to PCP for further management.  Return precautions discussed.   Patient understands and agrees with plan.  Patient vitals stable and discharged in satisfactory condition.  Patient also evaluated by Dr. Sherry Ruffing who guided the patient's management and agrees with plan.  Final Clinical Impressions(s) / ED Diagnoses   Final diagnoses:  Fall, initial encounter  Orthostatic hypotension    ED Discharge Orders    None       Frederica Kuster, Hershal Coria 08/03/17 1948    Tegeler, Gwenyth Allegra, MD 08/04/17 2350

## 2017-08-08 MED FILL — MEMANTINE HCL 10 MG TABLET: 10 | 30 days supply | Qty: 60 | Fill #2

## 2017-08-13 ENCOUNTER — Encounter: Payer: Self-pay | Admitting: Neurology

## 2017-08-14 MED FILL — LEVOTHYROXINE 50 MCG TABLET: 50 | 90 days supply | Qty: 90 | Fill #1

## 2017-08-14 MED FILL — tiZANidine HCL 4 MG TABS: 4 | 10 days supply | Qty: 20 | Fill #0

## 2017-08-14 MED FILL — predniSONE 10 MG TABS: 10 | 6 days supply | Qty: 21 | Fill #0

## 2017-08-19 ENCOUNTER — Emergency Department (HOSPITAL_COMMUNITY): Payer: Medicare Other

## 2017-08-19 ENCOUNTER — Other Ambulatory Visit: Payer: Self-pay

## 2017-08-19 ENCOUNTER — Emergency Department (HOSPITAL_COMMUNITY)
Admission: EM | Admit: 2017-08-19 | Discharge: 2017-08-19 | Disposition: A | Discharge status: Home / Self Care | Payer: Medicare Other | Attending: Emergency Medicine | Admitting: Emergency Medicine

## 2017-08-19 ENCOUNTER — Encounter (HOSPITAL_COMMUNITY): Payer: Self-pay

## 2017-08-19 DIAGNOSIS — Z79899 Other long term (current) drug therapy: Secondary | ICD-10-CM | POA: Insufficient documentation

## 2017-08-19 DIAGNOSIS — I10 Essential (primary) hypertension: Secondary | ICD-10-CM | POA: Insufficient documentation

## 2017-08-19 DIAGNOSIS — R55 Syncope and collapse: Secondary | ICD-10-CM | POA: Diagnosis present

## 2017-08-19 DIAGNOSIS — Z940 Kidney transplant status: Secondary | ICD-10-CM | POA: Insufficient documentation

## 2017-08-19 DIAGNOSIS — Z23 Encounter for immunization: Secondary | ICD-10-CM | POA: Diagnosis not present

## 2017-08-19 DIAGNOSIS — E039 Hypothyroidism, unspecified: Secondary | ICD-10-CM | POA: Insufficient documentation

## 2017-08-19 LAB — CBC
HCT: 42.3 % (ref 36.0–46.0)
Hemoglobin: 15.1 g/dL — ABNORMAL HIGH (ref 12.0–15.0)
MCH: 32.6 pg (ref 26.0–34.0)
MCHC: 35.7 g/dL (ref 30.0–36.0)
MCV: 91.4 fL (ref 78.0–100.0)
Platelets: 173 10*3/uL (ref 150–400)
RBC: 4.63 MIL/uL (ref 3.87–5.11)
RDW: 14 % (ref 11.5–15.5)
WBC: 9.5 10*3/uL (ref 4.0–10.5)

## 2017-08-19 LAB — URINALYSIS, ROUTINE W REFLEX MICROSCOPIC
Bacteria, UA: NONE SEEN
Bilirubin Urine: NEGATIVE
GLUCOSE, UA: NEGATIVE mg/dL
HGB URINE DIPSTICK: NEGATIVE
KETONES UR: NEGATIVE mg/dL
LEUKOCYTES UA: NEGATIVE
Nitrite: NEGATIVE
PH: 7 (ref 5.0–8.0)
Protein, ur: 30 mg/dL — AB
Specific Gravity, Urine: 1.009 (ref 1.005–1.030)

## 2017-08-19 LAB — BASIC METABOLIC PANEL
Anion gap: 10 (ref 5–15)
BUN: 28 mg/dL — AB (ref 6–20)
CHLORIDE: 100 mmol/L — AB (ref 101–111)
CO2: 24 mmol/L (ref 22–32)
CREATININE: 1.16 mg/dL — AB (ref 0.44–1.00)
Calcium: 9.1 mg/dL (ref 8.9–10.3)
GFR calc non Af Amer: 42 mL/min — ABNORMAL LOW (ref >60)
GFR, EST AFRICAN AMERICAN: 49 mL/min — AB (ref >60)
Glucose, Bld: 157 mg/dL — ABNORMAL HIGH (ref 65–99)
Potassium: 4 mmol/L (ref 3.5–5.1)
SODIUM: 134 mmol/L — AB (ref 135–145)

## 2017-08-19 LAB — CBG MONITORING, ED: GLUCOSE-CAPILLARY: 198 mg/dL — AB (ref 65–99)

## 2017-08-19 LAB — I-STAT TROPONIN, ED: Troponin i, poc: 0 ng/mL (ref 0.00–0.08)

## 2017-08-19 MED ORDER — TETANUS-DIPHTH-ACELL PERTUSSIS 5-2.5-18.5 LF-MCG/0.5 IM SUSP
0.5000 mL | Freq: Once | INTRAMUSCULAR | Status: AC
  Administered 2017-08-19: 0.5 mL via INTRAMUSCULAR
  Filled 2017-08-19: qty 0.5

## 2017-08-19 MED ORDER — LIDOCAINE-EPINEPHRINE (PF) 2 %-1:200000 IJ SOLN
20.0000 mL | Freq: Once | INTRAMUSCULAR | Status: DC
Start: 2017-08-19 — End: 2017-08-19
  Filled 2017-08-19: qty 20

## 2017-08-19 MED ORDER — BACITRACIN ZINC 500 UNIT/GM EX OINT
TOPICAL_OINTMENT | CUTANEOUS | Status: AC
  Filled 2017-08-19: qty 0.9

## 2017-08-19 MED ORDER — LORAZEPAM 1 MG PO TABS
1.0000 mg | ORAL_TABLET | Freq: Once | ORAL | Status: AC
  Administered 2017-08-19: 1 mg via ORAL
  Filled 2017-08-19: qty 1

## 2017-08-19 MED ORDER — SODIUM CHLORIDE 0.9 % IV BOLUS
1000.0000 mL | Freq: Once | INTRAVENOUS | Status: AC
  Administered 2017-08-19: 1000 mL via INTRAVENOUS

## 2017-08-19 NOTE — ED Notes (Signed)
Bed: FA21 Expected date:  Expected time:  Means of arrival:  Comments: 84 yo dizziness, elbow injury

## 2017-08-19 NOTE — Discharge Instructions (Addendum)
Work-up today in the ER was reassuring.  You were dehydrated leading to orthostatic hypotension, this can cause lightheadedness and passing out.  You need to increase your water intake.  You need close follow-up with cardiology in the next week.  Return for severe headache, chest pain, shortness of breath, palpitations, recurrent passing out episodes.

## 2017-08-19 NOTE — ED Notes (Signed)
Pt ambulated to restroom.  Pt stated that she had no distress and felt good.

## 2017-08-19 NOTE — ED Provider Notes (Signed)
[facility] DEPT Provider Note   CSN: [id number] Arrival date & time: 08/19/17  1401     History   Chief Complaint No chief complaint on file.   HPI [name] is a 84 y.o. female with history of breast cancer status post mastectomy, hypothyroidism, WPW syndrome, anemia is here for evaluation of syncope.  History is provided by patient and husband at bedside.  Patient woke up not feeling well with right-sided back pain (chronic) and dizziness.  She reports long history of dizziness and today's dizziness was similar.  Dizziness is described as "feeling like I was going to pass out" but also like things are spinning.  She was getting out of her bed when she felt lightheaded and fell to the ground, landing on her left elbow.  She denies head trauma.  She has a skin tear to her left elbow, mildly tender.  Her husband found her on the ground and was helping her up when she suddenly passed out for approximately 1 minute.  She suddenly came back to, feeling at baseline.  In the last month she has had one other similar episode of syncope.  Per chart review, patient has been seen for memory disturbances and dizziness at [facility] neurology on 4/11.  She has had MRI of the brain as well as EMG for evaluation of episodes of blacking out and dizziness.  EEG scheduled.   Denies HA, vision changes, difficulty with speech or walking, CP, SOB, palpitations, cough, SOB, abdominal pain, n/v/d/c, urinary symptoms. Taking meclizine and a muscle relaxer, unsure of dosing.   HPI  Past Medical History:  Diagnosis Date  . Arthritis   . Cancer ([place]) 1988   BREAST CANCER- MASTECTOMY RIGHT - NO CHEMO NO RADIATION  . CMV (cytomegalovirus infection) ([place])   . Depression with anxiety   . Dizziness   . Gout   . History of breast cancer   . Hypertension   . Hypothyroidism   . Iron deficiency anemia   . Memory loss   . Osteoarthritis   . Osteopenia   . Patella fracture   .  Peripheral neuropathy   . Raynauds phenomenon   . Renal disorder    HX OF KIDNEY TRANSPLANT - STATES KIDNEY FUNCTION OK -DR. SANDFORD / DR. FOX -   [facility]  . Sinus problem    HX OF SINUS INFECTIONS   . Thyroid disease   . Vitamin D deficiency   . Wolff-Parkinson-White (WPW) syndrome     Patient Active Problem List   Diagnosis Date Noted  . Polyneuropathy associated with underlying disease ([place]) 06/06/2017  . Mild cognitive impairment 04/23/2017  . Dizziness 04/23/2017  . Expected blood loss anemia 07/23/2013  . S/P Right patella ORIF 07/22/2013  . Renal transplant disorder 06/07/2012  . Unspecified hypothyroidism 06/07/2012  . Infection, atypical mycobacterium 06/07/2012    Past Surgical History:  Procedure Laterality Date  . ABDOMINAL HYSTERECTOMY    . BREAST SURGERY     RIGHT MASTECTOMY AND AXILLARY NODE DISSECTION  . JOINT REPLACEMENT     LEFT TOTAL KNEE REPLACEMENT   . KIDNEY TRANSPLANT  march 2012   right side - surgery at [facility]  . ORIF PATELLA Right 07/22/2013   Procedure: OPEN REDUCTION INTERNAL (ORIF) FIXATION PATELLA;  Surgeon: Mauri Pole, MD;  Location: [facility] ORS;  Service: Orthopedics;  Laterality: Right;  . SINUS SURGERY WITH INSTATRAK       OB History   None  Home Medications    Prior to Admission medications   Medication Sig Start Date End Date Taking? Authorizing Provider  acetaminophen (TYLENOL) 325 MG tablet Take 325 mg by mouth as needed (pain).    Yes [provider]  alendronate (FOSAMAX) 70 MG tablet Take 70 mg by mouth every 7 (seven) days.   Yes [provider]  amitriptyline (ELAVIL) 25 MG tablet Take 25 mg by mouth daily.  04/28/14  Yes [provider]  Calcium Carbonate (CALCIUM 600 PO) Take 1 tablet by mouth 2 (two) times daily.   Yes [provider]  cholecalciferol (VITAMIN D) 1000 UNITS tablet Take 2,000 Units by mouth daily.    Yes [provider]  denosumab  (PROLIA) 60 MG/ML SOLN injection Inject 60 mg into the skin every 6 (six) months. Administer in upper arm, thigh, or abdomen   Yes [provider]  levothyroxine (SYNTHROID, LEVOTHROID) 50 MCG tablet Take 50 mcg by mouth daily.   Yes [provider]  meclizine (ANTIVERT) 25 MG tablet Take 25 mg by mouth daily.    Yes [provider]  memantine (NAMENDA) 10 MG tablet Take 1 tablet (10 mg total) by mouth 2 (two) times daily. 06/06/17  Yes Marcial Pacas, MD  tacrolimus (PROGRAF) 1 MG capsule Take 2 mg by mouth 2 (two) times daily.    Yes [provider]  tiZANidine (ZANAFLEX) 4 MG tablet Take 4 mg by mouth as needed for muscle spasms. 08/14/17  Yes [provider]  predniSONE (DELTASONE) 5 MG tablet Take 10 mg by mouth daily.     [provider]  sulfamethoxazole-trimethoprim (BACTRIM,SEPTRA) 400-80 MG per tablet Take 1 tablet by mouth daily. Patient not taking: Reported on 08/19/2017 03/06/14   Campbell Riches, MD    Family History Family History  Problem Relation Age of Onset  . Other Mother        unsure of history   . Dementia Father   . Dementia Sister   . Cancer Brother        lung    Social History Social History   Tobacco Use  . Smoking status: Never Smoker  . Smokeless tobacco: Never Used  Substance Use Topics  . Alcohol use: Yes    Comment: MAYBE ONE GLASS OF WINE EVERY FEW MONTHS  . Drug use: No     Allergies   Cephalexin; Augmentin [amoxicillin-pot clavulanate]; Doxycycline; and Minocycline   Review of Systems Review of Systems  Skin: Positive for wound.  Neurological: Positive for dizziness, syncope and light-headedness.  All other systems reviewed and are negative.    Physical Exam Updated Vital Signs BP (!) 177/98 (BP Location: Left Arm)   Pulse 78   Temp 97.8 F (36.6 C) (Oral)   Resp 16   SpO2 98%   Physical Exam  Constitutional: She is oriented to person, place, and time. She appears  well-developed and well-nourished. She is cooperative. She is easily aroused. No distress.  HENT:  Mouth/Throat: Mucous membranes are dry.  No abrasions, lacerations, erythema or signs of facial or head injury. No scalp, facial or nasal bone tenderness No Raccoon's eyes. No Battle's sign. No epistaxis, otorrhea, septum midline. No intraoral or tongue bleeding or injury.   Eyes: Pupils are equal, round, and reactive to light. EOM are normal.  Neck:  No cervical spinous process tenderness No cervical paraspinal muscular tenderness Full active ROM of cervical spine 2+ carotid pulses bilaterally without bruits  Cardiovascular: Normal rate, regular rhythm,  S1 normal, S2 normal and normal heart sounds. Exam reveals no distant heart sounds and no friction rub.  No murmur heard. Pulses:      Carotid pulses are 2+ on the right side, and 2+ on the left side.      Radial pulses are 2+ on the right side, and 2+ on the left side.       Dorsalis pedis pulses are 2+ on the right side, and 2+ on the left side.  Pulmonary/Chest: Effort normal. No respiratory distress. She has no decreased breath sounds.  No chest wall tenderness  Abdominal:  Abdomen is soft NTND  Musculoskeletal: Normal range of motion. She exhibits no deformity.  Neurological: She is alert, oriented to person, place, and time and easily aroused.  Alert and oriented to self, place, time and event.  Speech is fluent without obvious dysarthria or dysphasia. Strength 5/5 with hand grip and ankle F/E.  Sensation to light touch intact in hands and feet. No truncal sway. No pronator drift. No leg drop.  Normal finger-to-nose, finger tapping and heel to shin. CN I and VIII not tested. CN II-XII grossly intact bilaterally.  Decreased by symmetric patellar DTR.     ED Treatments / Results  Labs (all labs ordered are listed, but only abnormal results are displayed) Labs Reviewed  BASIC METABOLIC PANEL - Abnormal; Notable for the following  components:      Result Value   Sodium 134 (*)    Chloride 100 (*)    Glucose, Bld 157 (*)    BUN 28 (*)    Creatinine, Ser 1.16 (*)    GFR calc non Af Amer 42 (*)    GFR calc Af Amer 49 (*)    All other components within normal limits  CBC - Abnormal; Notable for the following components:   Hemoglobin 15.1 (*)    All other components within normal limits  URINALYSIS, ROUTINE W REFLEX MICROSCOPIC - Abnormal; Notable for the following components:   Protein, ur 30 (*)    All other components within normal limits  CBG MONITORING, ED - Abnormal; Notable for the following components:   Glucose-Capillary 198 (*)    All other components within normal limits  CBG MONITORING, ED  I-STAT TROPONIN, ED    EKG None   .ekgmus  Radiology Dg Chest 2 View  Result Date: 08/19/2017 CLINICAL DATA:  Initial evaluation for acute fall, dizziness, cough, congestion. EXAM: CHEST - 2 VIEW COMPARISON:  Prior radiograph from 07/22/2013. FINDINGS: Cardiomegaly, stable from previous. Mediastinal silhouette normal. Aortic atherosclerosis. Mild elevation of the left hemidiaphragm. Lungs are clear. No focal infiltrates. No pulmonary edema or pleural effusion. No pneumothorax. No acute osseous abnormality. IMPRESSION: 1. No active cardiopulmonary disease. 2. Stable cardiomegaly without edema. 3. Aortic atherosclerosis. Electronically Signed   By: Jeannine Boga M.D.   On: 08/19/2017 16:28   Dg Elbow Complete Left  Result Date: 08/19/2017 CLINICAL DATA:  Fall, dizziness and laceration, initial encounter. EXAM: LEFT ELBOW - COMPLETE 3+ VIEW COMPARISON:  None. FINDINGS: No acute osseous or joint abnormality. Soft tissue injury the dorsal joint IMPRESSION: Soft tissue injury involving the dorsal aspect of the elbow. No fracture. Electronically Signed   By: Lorin Picket M.D.   On: 08/19/2017 16:26   Ct Head Wo Contrast  Result Date: 08/19/2017 CLINICAL DATA:  Initial evaluation for acute trauma, fall,  dizziness. EXAM: CT HEAD WITHOUT CONTRAST TECHNIQUE: Contiguous axial images were obtained from the base of the skull through the vertex  without intravenous contrast. COMPARISON:  Prior MRI from 05/02/2017. FINDINGS: Brain: Age-related cerebral atrophy with chronic microvascular ischemic disease. No acute intracranial hemorrhage. No acute large vessel territory infarct. No mass lesion, midline shift or mass effect. No hydrocephalus. No extra-axial fluid collection. Vascular: No hyperdense vessel. Calcified atherosclerosis at the skull base. Skull: Scalp soft tissues and calvarium within normal limits. Sinuses/Orbits: Globes and orbital soft tissues normal. Visualized paranasal sinuses are clear. No significant mastoid effusion. Other: None. IMPRESSION: 1. No acute intracranial abnormality. 2. Moderate atrophy with chronic small vessel ischemic disease. Electronically Signed   By: Jeannine Boga M.D.   On: 08/19/2017 16:42    Procedures Procedures (including critical care time)  Medications Ordered in ED Medications  sodium chloride 0.9 % bolus 1,000 mL (0 mLs Intravenous Stopped 08/19/17 1854)  sodium chloride 0.9 % bolus 1,000 mL (0 mLs Intravenous Stopped 08/19/17 1854)  Tdap (BOOSTRIX) injection 0.5 mL (0.5 mLs Intramuscular Given 08/19/17 1908)  LORazepam (ATIVAN) tablet 1 mg (1 mg Oral Given 08/19/17 1907)     Initial Impression / Assessment and Plan / ED Course  I have reviewed the triage vital signs and the nursing notes.  Pertinent labs & imaging results that were available during my care of the patient were reviewed by me and considered in my medical decision making (see chart for details).  Clinical Course as of Aug 20 33  Sun Aug 19, 2017  1941 Creatinine(!): 1.16 [CG]  1941 BUN(!): 28 [CG]    Clinical Course User Index [CG] Kinnie Feil, PA-C   84 yo with chronic dizziness described as both light-headedness and dizzy/vertigo.  In the last two weeks she has had two  syncopal episodes. Followed closed by neurology for this, last seen 07/26/17. Recent MRI brain and EMG done, pending EEG.    Exam as above reassuring, skin tear to left elbow. Neuro exam intact. She looks dry. NSR on monitor. Given age, memory disturbance, will initiate infectious work up, trop, ekg, CT head, orthostatics pending.  Final Clinical Impressions(s) / ED Diagnoses   Patient with positive orthostatic hypotension.  This fits clinical picture with decreased fluid intake, dry mucous membranes, lightheadedness.  She received 2 L IV fluids.  She ambulated without assist and without symptoms.  She had urine output.  Lab work, urinalysis, imaging and EKG reviewed and reassuring.  Creatinine and BUN slightly elevated.  At this time patient deemed appropriate for discharge with close PCP/cardiology follow-up.  Has had close follow-up with neurology for similar symptoms and has a pending EEG.  There is no incontinence, tongue biting today to suggest seizure activity.  Patient has not had any cardiac work-up, encouraged her to follow-up within 1 week for further evaluation.  patient shared with supervising physician. Final diagnoses:  Syncope and collapse    ED Discharge Orders    None       Arlean Hopping 08/20/17 1119    Julianne Rice, MD 08/23/17 [phone]

## 2017-08-19 NOTE — ED Triage Notes (Signed)
Patient presented to ed from home with fall and dizziness with laceration to left elbow from fall. Patient alert and oriented x 4

## 2017-08-21 MED FILL — predniSONE 10 MG TABS: 10 | 6 days supply | Qty: 21 | Fill #0

## 2017-08-23 ENCOUNTER — Other Ambulatory Visit: Payer: Medicare Other

## 2017-08-23 MED FILL — SERTRALINE HCL 50 MG TABLET: 50 | 31 days supply | Qty: 30 | Fill #0

## 2017-08-28 MED FILL — MIRTAZAPINE 7.5 MG TABLET: 7.5 | 30 days supply | Qty: 30 | Fill #5

## 2017-08-28 MED FILL — SULFAMETHOXAZOLE-TMP SS TAB: 400-80 | 28 days supply | Qty: 12 | Fill #4

## 2017-08-28 MED FILL — TACROLIMUS 1 MG CAPSULE: 1 | 30 days supply | Qty: 60 | Fill #8

## 2017-08-28 MED FILL — TACROLIMUS 0.5 MG CAPSULE: 0.5 | 30 days supply | Qty: 60 | Fill #8

## 2017-08-29 MED FILL — DICLOFENAC SODIUM 1% GEL: 1 | 30 days supply | Qty: 100 | Fill #0

## 2017-09-03 ENCOUNTER — Telehealth: Payer: Self-pay | Admitting: Cardiology

## 2017-09-03 NOTE — Telephone Encounter (Signed)
Called patient to schedule her with Dr. Percival Spanish as a new patient referral from Dr. Shelia Media.

## 2017-09-06 MED FILL — MEMANTINE HCL 10 MG TABLET: 10 | 30 days supply | Qty: 60 | Fill #3

## 2017-09-11 ENCOUNTER — Ambulatory Visit: Payer: Medicare Other

## 2017-09-11 DIAGNOSIS — R55 Syncope and collapse: Secondary | ICD-10-CM | POA: Diagnosis not present

## 2017-09-14 NOTE — Procedures (Signed)
   HISTORY: 84 year old female presented with memory loss, dizziness, passing out spells  TECHNIQUE:  16 channel EEG was performed based on standard 10-16 international system. One channel was dedicated to EKG, which has demonstrates normal sinus rhythm of 72 beats per minutes.  Upon awakening, the posterior background activity was well-developed, in alpha range,reactive to eye opening and closure.  There was no evidence of epileptiform discharge.  Photic stimulation was performed, which induced a symmetric photic driving.  Hyperventilation was not performed.  No sleep was achieved.  CONCLUSION: This is a normal awake EEG.  There is no electrodiagnostic evidence of epileptiform discharge.  Marcial Pacas, M.D. Ph.D.  [facility] [place], [place] [address] Phone: [phone] Fax:      [phone]

## 2017-09-17 ENCOUNTER — Telehealth: Payer: Self-pay | Admitting: *Deleted

## 2017-09-17 MED FILL — SERTRALINE HCL 50 MG TABLET: 50 | 30 days supply | Qty: 30 | Fill #1

## 2017-09-17 NOTE — Telephone Encounter (Signed)
Spoke with patient's husband on DPR and informed him her EEG was normal. Advised him if she has any concerns or questions before her FU to call back. He verbalized understanding, appreciation.

## 2017-09-18 NOTE — Telephone Encounter (Signed)
Keep Dr. Rhea Belton

## 2017-09-18 NOTE — Telephone Encounter (Signed)
Patient had two follow ups scheduled for August. Per NP, follow up with Dr Krista Blue kept; cancelled FU with NP>

## 2017-09-25 ENCOUNTER — Encounter (INDEPENDENT_AMBULATORY_CARE_PROVIDER_SITE_OTHER): Payer: Self-pay | Admitting: Orthopaedic Surgery

## 2017-09-25 ENCOUNTER — Ambulatory Visit (INDEPENDENT_AMBULATORY_CARE_PROVIDER_SITE_OTHER): Payer: Medicare Other | Admitting: Orthopaedic Surgery

## 2017-09-25 ENCOUNTER — Ambulatory Visit (INDEPENDENT_AMBULATORY_CARE_PROVIDER_SITE_OTHER): Payer: Medicare Other

## 2017-09-25 VITALS — BP 133/69 | HR 74 | Ht 66.5 in | Wt 112.0 lb

## 2017-09-25 DIAGNOSIS — M545 Low back pain: Secondary | ICD-10-CM

## 2017-09-25 NOTE — Progress Notes (Signed)
Office Visit Note   Patient: [name]           Date of Birth: 06/20/1931           MRN: [id number] Visit Date: 09/25/2017              Requested by: Deland Pretty, MD [address] [place], [place] [address] PCP: Deland Pretty, MD   Assessment & Plan: Visit Diagnoses:  1. Acute midline low back pain, with sciatica presence unspecified     Plan: History of syncope x2 with falls.  Increased back and right leg pain greater than 2 months.  He is failed massage treatment, therapy, anti-inflammatories.  It bothers her on a daily basis.  We will proceed with lumbar MRI scan to rule out right L4-5 disc protrusion where she has some anterolisthesis.  Office follow-up after scan.  Follow-Up Instructions: No follow-ups on file.   Orders:  Orders Placed This Encounter  Procedures  . XR Lumbar Spine 2-3 Views  . MR Lumbar Spine w/o contrast   No orders of the defined types were placed in this encounter.     Procedures: No procedures performed   Clinical Data: No additional findings.   Subjective: Chief Complaint  Patient presents with  . Lower Back - Pain    HPI 84 year old female seen today with her husband.  She states she passed out fell on good Friday had back pain since that time.  She fell in the doorway hit the back of the door and went down onto the tile floor.  She fell again 10 days ago and went to the emergency room both times.  She has pain primarily in the back right buttocks lower back radiates into the buttocks into her thigh.  She is had some intermittent numbness and tingling and occasional memory problems.  Patient has a kidney transplant previous history of mastectomy on the right side.  She lives at [facility].  She is been through massage treatment of physical therapy without improvement.  She is also used extra strength Tylenol.  Plain radiographs demonstrated some facet arthropathy and anterolisthesis at L4-5 with few millimeters.   Negative for acute fracture.  Her hip on 08/03/2017 were also negative.  She denies associated bowel or bladder symptoms.  Appointment with cardiology coming up.  EEG was normal per chart review.  Head CT 08/19/2017 negative  for acute changes.  Moderate atrophy with chronic small vessel ischemic disease noted.  Review of Systems 14 point review of system positive for renal transplant.  History of atypical Mycobacterium infection ORIF patella.  Mild cognitive impairment.  Polyneuropathy, unspecified hypothyroidism.   Objective: Vital Signs: BP 133/69   Pulse 74   Ht 5' 6.5" (1.689 m)   Wt 112 lb (50.8 kg)   BMI 17.81 kg/m   Physical Exam  Constitutional: She is oriented to person, place, and time. She appears well-developed and well-nourished.  HENT:  Head: Normocephalic.  Right Ear: External ear normal.  Left Ear: External ear normal.  Eyes: Pupils are equal, round, and reactive to light.  Neck: No tracheal deviation present. No thyromegaly present.  Cardiovascular: Normal rate.  Pulmonary/Chest: Effort normal.  Abdominal: Soft.  Neurological: She is alert and oriented to person, place, and time.  Skin: Skin is warm and dry.  Psychiatric: She has a normal mood and affect. Her behavior is normal. Thought content normal.    Ortho Exam pelvis is level patient has some discomfort with straight leg  raising 90 degrees mild sciatic notch tenderness on the right negative on the left.  Knee and ankle jerk are intact negative logroll to the hips.  No hip flexion weakness.  Anterior tib gastrocsoleus is active and symmetrical resistance testing.  Adductors are strong.  Distal pulses are intact.  Good cervical range of motion intact upper extremity reflexes no significant brachial plexus tenderness negative Lhermitte.  No lower extremity clonus.  Specialty Comments:  No specialty comments available.  Imaging: Mr Lumbar Spine W/o Contrast  Result Date: 09/28/2017 CLINICAL DATA:  Low back pain  radiating to the right buttock and down the right leg, 2 months duration. Fell 2 months ago. EXAM: MRI LUMBAR SPINE WITHOUT CONTRAST TECHNIQUE: Multiplanar, multisequence MR imaging of the lumbar spine was performed. No intravenous contrast was administered. COMPARISON:  Radiography 09/25/2017 FINDINGS: Segmentation:  5 lumbar type vertebral bodies. Alignment:  4 mm anterolisthesis L4-5. Vertebrae:  No fracture or primary bone finding. Conus medullaris and cauda equina: Conus extends to the L1 level. Conus and cauda equina appear normal. Paraspinal and other soft tissues: Atrophic changes of the kidneys. Right iliac fossa transplant kidney. Disc levels: Normal at L2-3 and above. L3-4: Mild bulging of the disc. Mild facet hypertrophy. No stenosis. L4-5: Bilateral facet arthropathy with edema and joint effusions. 4 mm of anterolisthesis that could worsen with standing or flexion. Narrowing of both lateral recesses right more than left. Right L5 nerve root compression could occur in this location. Foraminal compromise on the right due to osteophyte and a foraminal to extraforaminal disc fragment likely to compress the right L4 nerve. L5-S1: Mild bulging of the disc. Mild facet degeneration. No stenosis. IMPRESSION: The significant findings are likely at L4-5 where there is advanced chronic bilateral facet arthropathy with joint edema and joint effusions allowing anterolisthesis of 4 mm. The disc bulges generally, and there is stenosis of the lateral recesses worse on the right, which could compress the right L5 nerve. There is right foraminal to extraforaminal disc herniation with a disc fragment that would seem likely to focally compress the right L4 nerve. Electronically Signed   By: Nelson Chimes M.D.   On: 09/28/2017 11:21     PMFS History: Patient Active Problem List   Diagnosis Date Noted  . Polyneuropathy associated with underlying disease ([place]) 06/06/2017  . Mild cognitive impairment 04/23/2017  .  Dizziness 04/23/2017  . Expected blood loss anemia 07/23/2013  . S/P Right patella ORIF 07/22/2013  . Renal transplant disorder 06/07/2012  . Unspecified hypothyroidism 06/07/2012  . Infection, atypical mycobacterium 06/07/2012   Past Medical History:  Diagnosis Date  . Arthritis   . Cancer ([place]) 1988   BREAST CANCER- MASTECTOMY RIGHT - NO CHEMO NO RADIATION  . CMV (cytomegalovirus infection) ([place])   . Depression with anxiety   . Dizziness   . Gout   . History of breast cancer   . Hypertension   . Hypothyroidism   . Iron deficiency anemia   . Memory loss   . Osteoarthritis   . Osteopenia   . Patella fracture   . Peripheral neuropathy   . Raynauds phenomenon   . Renal disorder    HX OF KIDNEY TRANSPLANT - STATES KIDNEY FUNCTION OK -DR. SANDFORD / DR. FOX -   [facility]  . Sinus problem    HX OF SINUS INFECTIONS   . Thyroid disease   . Vitamin D deficiency   . Wolff-Parkinson-White (WPW) syndrome     Family History  Problem Relation  Age of Onset  . Other Mother        unsure of history   . Dementia Father   . Dementia Sister   . Cancer Brother        lung    Past Surgical History:  Procedure Laterality Date  . ABDOMINAL HYSTERECTOMY    . BREAST SURGERY     RIGHT MASTECTOMY AND AXILLARY NODE DISSECTION  . JOINT REPLACEMENT     LEFT TOTAL KNEE REPLACEMENT   . KIDNEY TRANSPLANT  march 2012   right side - surgery at [facility]  . ORIF PATELLA Right 07/22/2013   Procedure: OPEN REDUCTION INTERNAL (ORIF) FIXATION PATELLA;  Surgeon: Mauri Pole, MD;  Location: [facility];  Service: Orthopedics;  Laterality: Right;  . SINUS SURGERY WITH INSTATRAK     Social History   Occupational History  . Occupation: Retired  Tobacco Use  . Smoking status: Never Smoker  . Smokeless tobacco: Never Used  Substance and Sexual Activity  . Alcohol use: Yes    Comment: MAYBE ONE GLASS OF WINE EVERY FEW MONTHS  . Drug use: No  . Sexual activity: Not on file

## 2017-09-28 ENCOUNTER — Ambulatory Visit
Admission: RE | Admit: 2017-09-28 | Discharge: 2017-09-28 | Disposition: A | Discharge status: Home / Self Care | Payer: Medicare Other | Source: Ambulatory Visit | Attending: Orthopaedic Surgery | Admitting: Orthopaedic Surgery

## 2017-09-28 ENCOUNTER — Encounter (INDEPENDENT_AMBULATORY_CARE_PROVIDER_SITE_OTHER): Payer: Self-pay | Admitting: Orthopaedic Surgery

## 2017-09-28 DIAGNOSIS — M545 Low back pain: Secondary | ICD-10-CM

## 2017-10-02 ENCOUNTER — Ambulatory Visit (INDEPENDENT_AMBULATORY_CARE_PROVIDER_SITE_OTHER): Payer: Medicare Other | Admitting: Orthopaedic Surgery

## 2017-10-02 ENCOUNTER — Encounter (INDEPENDENT_AMBULATORY_CARE_PROVIDER_SITE_OTHER): Payer: Self-pay | Admitting: Orthopaedic Surgery

## 2017-10-02 ENCOUNTER — Ambulatory Visit (INDEPENDENT_AMBULATORY_CARE_PROVIDER_SITE_OTHER): Payer: Medicare Other

## 2017-10-02 VITALS — BP 115/68 | HR 80 | Ht 66.5 in | Wt 112.0 lb

## 2017-10-02 DIAGNOSIS — M545 Low back pain: Secondary | ICD-10-CM

## 2017-10-03 ENCOUNTER — Encounter: Payer: Self-pay | Admitting: Cardiology

## 2017-10-03 ENCOUNTER — Ambulatory Visit (INDEPENDENT_AMBULATORY_CARE_PROVIDER_SITE_OTHER): Payer: Medicare Other | Admitting: Cardiology

## 2017-10-03 DIAGNOSIS — G3184 Mild cognitive impairment, so stated: Secondary | ICD-10-CM

## 2017-10-03 DIAGNOSIS — T8610 Unspecified complication of kidney transplant: Secondary | ICD-10-CM | POA: Diagnosis not present

## 2017-10-03 DIAGNOSIS — Z853 Personal history of malignant neoplasm of breast: Secondary | ICD-10-CM | POA: Insufficient documentation

## 2017-10-03 DIAGNOSIS — M545 Low back pain: Secondary | ICD-10-CM

## 2017-10-03 DIAGNOSIS — R55 Syncope and collapse: Secondary | ICD-10-CM

## 2017-10-03 DIAGNOSIS — I456 Pre-excitation syndrome: Secondary | ICD-10-CM | POA: Diagnosis not present

## 2017-10-03 DIAGNOSIS — M549 Dorsalgia, unspecified: Secondary | ICD-10-CM | POA: Insufficient documentation

## 2017-10-03 NOTE — Assessment & Plan Note (Signed)
Rt mastectomy 1998

## 2017-10-03 NOTE — Assessment & Plan Note (Signed)
Referred for recurrent syncope and collapse- 07/26/17 and 08/19/17

## 2017-10-03 NOTE — Patient Instructions (Signed)
Medication Instructions: Kerin Ransom, PA-C, recommends that you continue on your current medications as directed. Please refer to the Current Medication list given to you today.  Labwork: NONE ORDERED  Testing/Procedures: 1. Echocardiogram - Your physician has requested that you have an echocardiogram. Echocardiography is a painless test that uses sound waves to create images of your heart. It provides your doctor with information about the size and shape of your heart and how well your heart's chambers and valves are working. This procedure takes approximately one hour. There are no restrictions for this procedure.  2. 30-day Cardiac Event Monitor - Your physician has recommended that you wear an event monitor. Event monitors are medical devices that record the heart's electrical activity. Doctors most often [place] these monitors to diagnose arrhythmias. Arrhythmias are problems with the speed or rhythm of the heartbeat. The monitor is a small, portable device. You can wear one while you do your normal daily activities. This is usually used to diagnose what is causing palpitations/syncope (passing out).  >>These tests will be performed at our [facility] location [place], [place] [address] [phone]  Follow-up: Lurena Joiner recommends that you schedule a follow-up appointment in 6-8 weeks with Dr Oval Linsey.  If you need a refill on your cardiac medications before your next appointment, please call your pharmacy.

## 2017-10-03 NOTE — Assessment & Plan Note (Signed)
Seen on 2015 EKG (pre op knee surgery)

## 2017-10-03 NOTE — Assessment & Plan Note (Signed)
Back injury after her syncopal spell April 2019

## 2017-10-03 NOTE — Assessment & Plan Note (Signed)
S/P nephrectomy March 2012 Nephrologist Rexene Agent, MD

## 2017-10-03 NOTE — Progress Notes (Signed)
10/03/2017 [name]   29-Jul-1931  [id number]  Primary Physician Deland Pretty, MD Primary Cardiologist: Dr Oval Linsey (new)  HPI:  Pleasant 84 y/o female followed by Dr Dwaine Gale, referred to [place] for evaluation of recurrent syncope. The pt carries a diagnosis of WPW based on an EKG done in 2015 when she was admitted for knee surgery. There is no history of any cardiology evaluation. Other medical problems include renal transplant in 2012, breast cancer s/p mastectomy in 1998, hypothyroidism, and mild dementia. She and her husband live at [facility] (independent living). On 08/03/17 she had an unwitnessed syncopal event. Her husband heard her fall and found her in the doorway between the kitchen and the garage. She has no memory of any pre syncopal symptoms, no tachycardia or nausea. She injured her back and was seen and released from [facility]. On 08/19/17 her husband heard her fall and found her by the bed. Again she has no recollection of any pre syncopal symptoms. She did have an EEG after this that was normal. She denies any tachycardia or chest pain.    Current Outpatient Medications  Medication Sig Dispense Refill  . acetaminophen (TYLENOL) 325 MG tablet Take 325 mg by mouth as needed (pain).     Marland Kitchen alendronate (FOSAMAX) 70 MG tablet Take 70 mg by mouth every 7 (seven) days.    Marland Kitchen amitriptyline (ELAVIL) 25 MG tablet Take 25 mg by mouth daily.     . Calcium Carbonate (CALCIUM 600 PO) Take 1 tablet by mouth 2 (two) times daily.    . cholecalciferol (VITAMIN D) 1000 UNITS tablet Take 2,000 Units by mouth daily.     Marland Kitchen denosumab (PROLIA) 60 MG/ML SOLN injection Inject 60 mg into the skin every 6 (six) months. Administer in upper arm, thigh, or abdomen    . levothyroxine (SYNTHROID, LEVOTHROID) 50 MCG tablet Take 50 mcg by mouth daily.    . meclizine (ANTIVERT) 25 MG tablet Take 25 mg by mouth daily.     . memantine (NAMENDA) 10 MG tablet Take 1 tablet (10 mg total) by mouth 2 (two)  times daily. 60 tablet 11  . mirtazapine (REMERON) 7.5 MG tablet   12  . predniSONE (DELTASONE) 5 MG tablet Take 10 mg by mouth daily.     . sertraline (ZOLOFT) 50 MG tablet   4  . sulfamethoxazole-trimethoprim (BACTRIM,SEPTRA) 400-80 MG per tablet Take 1 tablet by mouth daily. 30 tablet 11  . tacrolimus (PROGRAF) 1 MG capsule Take 2 mg by mouth 2 (two) times daily.     Marland Kitchen tiZANidine (ZANAFLEX) 4 MG tablet Take 4 mg by mouth as needed for muscle spasms.  0   No current facility-administered medications for this visit.     Allergies  Allergen Reactions  . Cephalexin Hives  . Augmentin [Amoxicillin-Pot Clavulanate] Nausea And Vomiting  . Doxycycline Nausea And Vomiting  . Minocycline Hives and Other (See Comments)    Gave bladder infection     Past Medical History:  Diagnosis Date  . Arthritis   . Cancer ([place]) 1988   BREAST CANCER- MASTECTOMY RIGHT - NO CHEMO NO RADIATION  . CMV (cytomegalovirus infection) ([place])   . Depression with anxiety   . Dizziness   . Gout   . History of breast cancer   . Hypertension   . Hypothyroidism   . Iron deficiency anemia   . Memory loss   . Osteoarthritis   . Osteopenia   . Patella fracture   .  Peripheral neuropathy   . Raynauds phenomenon   . Renal disorder    HX OF KIDNEY TRANSPLANT - STATES KIDNEY FUNCTION OK -DR. SANDFORD / DR. FOX -   [facility]  . Sinus problem    HX OF SINUS INFECTIONS   . Thyroid disease   . Vitamin D deficiency   . Wolff-Parkinson-White (WPW) syndrome     Social History   Socioeconomic History  . Marital status: Married    Spouse name: Not on file  . Number of children: 2  . Years of education: college  . Highest education level: Bachelor's degree (e.g., BA, AB, BS)  Occupational History  . Occupation: Retired  Scientific laboratory technician  . Financial resource strain: Not on file  . Food insecurity:    Worry: Not on file    Inability: Not on file  . Transportation needs:    Medical: Not on file     Non-medical: Not on file  Tobacco Use  . Smoking status: Never Smoker  . Smokeless tobacco: Never Used  Substance and Sexual Activity  . Alcohol use: Yes    Comment: MAYBE ONE GLASS OF WINE EVERY FEW MONTHS  . Drug use: No  . Sexual activity: Not on file  Lifestyle  . Physical activity:    Days per week: Not on file    Minutes per session: Not on file  . Stress: Not on file  Relationships  . Social connections:    Talks on phone: Not on file    Gets together: Not on file    Attends religious service: Not on file    Active member of club or organization: Not on file    Attends meetings of clubs or organizations: Not on file    Relationship status: Not on file  . Intimate partner violence:    Fear of current or ex partner: Not on file    Emotionally abused: Not on file    Physically abused: Not on file    Forced sexual activity: Not on file  Other Topics Concern  . Not on file  Social History Narrative   Lives at home with [facility] at [facility].   Right-handed.   Occasional caffeine use.     Family History  Problem Relation Age of Onset  . Other Mother        unsure of history   . Dementia Father   . Dementia Sister   . Cancer Brother        lung     Review of Systems: General: negative for chills, fever, night sweats or weight changes.  Cardiovascular: negative for chest pain, dyspnea on exertion, edema, orthopnea, palpitations, paroxysmal nocturnal dyspnea or shortness of breath Dermatological: negative for rash Respiratory: negative for cough or wheezing Urologic: negative for hematuria Abdominal: negative for nausea, vomiting, diarrhea, bright red blood per rectum, melena, or hematemesis Neurologic: negative for visual changes, or dizziness All other systems reviewed and are otherwise negative except as noted above.    Blood pressure 131/77, pulse 73, height 5' 6.5" (1.689 m), weight 111 lb 12.8 oz (50.7 kg).  General appearance: alert, cooperative,  appears stated age, no distress and thin Neck: no carotid bruit and no JVD Lungs: clear to auscultation bilaterally Heart: regular rate and rhythm Abdomen: soft, non-tender; bowel sounds normal; no masses,  no organomegaly Extremities: extremities normal, atraumatic, no cyanosis or edema Pulses: 2+ and symmetric Skin: Skin color, texture, turgor normal. No rashes or lesions Neurologic: Grossly normal  EKG  NSR, LVH by volts with TWI V5-V6- no Delta wave seen  ASSESSMENT AND PLAN:   Syncope Referred for recurrent syncope and collapse- 07/26/17 and 08/19/17  Renal transplant disorder S/P nephrectomy March 2012 Nephrologist Rexene Agent, MD  Mild cognitive impairment MRI Feb 2019-atrophy. Dr Krista Blue follows EEG negative May 2019  History of breast cancer Rt mastectomy 1998  Back pain Back injury after her syncopal spell April 2019  WPW (Wolff-Parkinson-White syndrome) Seen on 2015 EKG (pre op knee surgery)   PLAN  I reviewed her 2015 EKG and her case with Dr Oval Linsey in the office today. We aggree with the computer reading of WPW on her EKG from 2015. No other EKGs show this. We plan to obtain an echo and 30 day event monitor. F/U with Dr Oval Linsey in 6-8 weeks.   Kerin Ransom PA-C 10/03/2017 10:33 AM

## 2017-10-03 NOTE — Assessment & Plan Note (Signed)
MRI Feb 2019-atrophy. Dr Krista Blue follows EEG negative May 2019

## 2017-10-04 ENCOUNTER — Other Ambulatory Visit: Payer: Self-pay

## 2017-10-04 ENCOUNTER — Ambulatory Visit (HOSPITAL_COMMUNITY): Payer: Medicare Other | Attending: Cardiology

## 2017-10-04 DIAGNOSIS — G629 Polyneuropathy, unspecified: Secondary | ICD-10-CM | POA: Insufficient documentation

## 2017-10-04 DIAGNOSIS — E039 Hypothyroidism, unspecified: Secondary | ICD-10-CM | POA: Insufficient documentation

## 2017-10-04 DIAGNOSIS — I081 Rheumatic disorders of both mitral and tricuspid valves: Secondary | ICD-10-CM | POA: Insufficient documentation

## 2017-10-04 DIAGNOSIS — Z853 Personal history of malignant neoplasm of breast: Secondary | ICD-10-CM | POA: Diagnosis not present

## 2017-10-04 DIAGNOSIS — R55 Syncope and collapse: Secondary | ICD-10-CM

## 2017-10-04 DIAGNOSIS — I456 Pre-excitation syndrome: Secondary | ICD-10-CM | POA: Insufficient documentation

## 2017-10-04 DIAGNOSIS — I10 Essential (primary) hypertension: Secondary | ICD-10-CM | POA: Diagnosis not present

## 2017-10-04 MED FILL — TACROLIMUS 1 MG CAPSULE: 1 | 30 days supply | Qty: 60 | Fill #0

## 2017-10-04 MED FILL — SULFAMETHOXAZOLE-TMP SS TAB: 400-80 | 28 days supply | Qty: 12 | Fill #5

## 2017-10-04 MED FILL — MIRTAZAPINE 7.5 MG TABLET: 7.5 | 30 days supply | Qty: 30 | Fill #6

## 2017-10-04 MED FILL — MEMANTINE HCL 10 MG TABLET: 10 | 30 days supply | Qty: 60 | Fill #4

## 2017-10-04 MED FILL — TACROLIMUS 0.5 MG CAPSULE: 0.5 | 30 days supply | Qty: 60 | Fill #0

## 2017-10-10 ENCOUNTER — Ambulatory Visit (INDEPENDENT_AMBULATORY_CARE_PROVIDER_SITE_OTHER): Payer: Medicare Other

## 2017-10-10 ENCOUNTER — Encounter (INDEPENDENT_AMBULATORY_CARE_PROVIDER_SITE_OTHER): Payer: Self-pay | Admitting: Orthopaedic Surgery

## 2017-10-10 DIAGNOSIS — R55 Syncope and collapse: Secondary | ICD-10-CM

## 2017-10-10 NOTE — Progress Notes (Signed)
Office Visit Note   Patient: [name]           Date of Birth: 08/31/1931           MRN: [id number] Visit Date: 10/02/2017              Requested by: Deland Pretty, MD [address] [place], [place] [address] PCP: Deland Pretty, MD   Assessment & Plan: Visit Diagnoses:  1. Acute midline low back pain, with sciatica presence unspecified     Plan: MRI scan shows advanced changes at L4-5 with arthropathy and anterolisthesis of 4 mm with right foraminal and extraforaminal disc herniation likely compressing the right L4 nerve root which is consistent with her symptoms.  We will set her up for single epidural injection on the right at the L4-5 level see if she gets relief and I will follow-up with her for return visit in 2 months.  Fall prevention was discussed.  Follow-Up Instructions: Return in about 2 months (around 12/02/2017).   Orders:  Orders Placed This Encounter  Procedures  . XR Lumbar Spine 2-3 Views  . Ambulatory referral to Physical Medicine Rehab   No orders of the defined types were placed in this encounter.     Procedures: No procedures performed   Clinical Data: No additional findings.   Subjective: Chief Complaint  Patient presents with  . Lower Back - Pain, Follow-up    MRI review    HPI 84 year old female returns with history of syncope with falls x2.  Increased back and leg pain on the right side x2 months.  Patient's been through massage treatment, therapy, anti-inflammatories.  MRI scan is been obtained and is available for review.  Review of Systems 14 point review of systems updated unchanged from 09/25/2017 office visit last week.  Of note his previous ORIF patella, renal transplant, mild cognitive impairment.   Objective: Vital Signs: BP 115/68   Pulse 80   Ht 5' 6.5" (1.689 m)   Wt 112 lb (50.8 kg)   BMI 17.81 kg/m   Physical Exam  Constitutional: She is oriented to person, place, and time. She appears  well-developed.  HENT:  Head: Normocephalic.  Right Ear: External ear normal.  Left Ear: External ear normal.  Eyes: Pupils are equal, round, and reactive to light.  Neck: No tracheal deviation present. No thyromegaly present.  Cardiovascular: Normal rate.  Pulmonary/Chest: Effort normal.  Abdominal: Soft.  Neurological: She is alert and oriented to person, place, and time.  Skin: Skin is warm and dry.  Psychiatric: She has a normal mood and affect. Her behavior is normal.  Alert pleasant very talkative.    Ortho Exam patient has some sciatic notch tenderness on the right side.  Good hip range of motion.  Anterior tib gastrocsoleus take good resistive testing distal pulses are intact.  Good cervical range of motion.  No lower extremity clonus.  Knee and ankle jerk are symmetrical.  Specialty Comments:  No specialty comments available.  Imaging: CLINICAL DATA:  Low back pain radiating to the right buttock and down the right leg, 2 months duration. Fell 2 months ago.  EXAM: MRI LUMBAR SPINE WITHOUT CONTRAST  TECHNIQUE: Multiplanar, multisequence MR imaging of the lumbar spine was performed. No intravenous contrast was administered.  COMPARISON:  Radiography 09/25/2017  FINDINGS: Segmentation:  5 lumbar type vertebral bodies.  Alignment:  4 mm anterolisthesis L4-5.  Vertebrae:  No fracture or primary bone finding.  Conus medullaris and cauda equina: Conus  extends to the L1 level. Conus and cauda equina appear normal.  Paraspinal and other soft tissues: Atrophic changes of the kidneys. Right iliac fossa transplant kidney.  Disc levels:  Normal at L2-3 and above.  L3-4: Mild bulging of the disc. Mild facet hypertrophy. No stenosis.  L4-5: Bilateral facet arthropathy with edema and joint effusions. 4 mm of anterolisthesis that could worsen with standing or flexion. Narrowing of both lateral recesses right more than left. Right L5 nerve root compression  could occur in this location. Foraminal compromise on the right due to osteophyte and a foraminal to extraforaminal disc fragment likely to compress the right L4 nerve.  L5-S1: Mild bulging of the disc. Mild facet degeneration. No stenosis.  IMPRESSION: The significant findings are likely at L4-5 where there is advanced chronic bilateral facet arthropathy with joint edema and joint effusions allowing anterolisthesis of 4 mm. The disc bulges generally, and there is stenosis of the lateral recesses worse on the right, which could compress the right L5 nerve. There is right foraminal to extraforaminal disc herniation with a disc fragment that would seem likely to focally compress the right L4 nerve.   Electronically Signed   By: Nelson Chimes M.D.   On: 09/28/2017 11:21    PMFS History: Patient Active Problem List   Diagnosis Date Noted  . Syncope 10/03/2017  . History of breast cancer 10/03/2017  . Back pain 10/03/2017  . WPW (Wolff-Parkinson-White syndrome) 10/03/2017  . Polyneuropathy associated with underlying disease ([place]) 06/06/2017  . Mild cognitive impairment 04/23/2017  . Dizziness 04/23/2017  . Expected blood loss anemia 07/23/2013  . S/P Right patella ORIF 07/22/2013  . Renal transplant disorder 06/07/2012  . Unspecified hypothyroidism 06/07/2012  . Infection, atypical mycobacterium 06/07/2012   Past Medical History:  Diagnosis Date  . Arthritis   . Cancer ([place]) 1988   BREAST CANCER- MASTECTOMY RIGHT - NO CHEMO NO RADIATION  . CMV (cytomegalovirus infection) ([place])   . Depression with anxiety   . Dizziness   . Gout   . History of breast cancer   . Hypertension   . Hypothyroidism   . Iron deficiency anemia   . Memory loss   . Osteoarthritis   . Osteopenia   . Patella fracture   . Peripheral neuropathy   . Raynauds phenomenon   . Renal disorder    HX OF KIDNEY TRANSPLANT - STATES KIDNEY FUNCTION OK -DR. SANDFORD / DR. FOX -   [facility]    . Sinus problem    HX OF SINUS INFECTIONS   . Thyroid disease   . Vitamin D deficiency   . Wolff-Parkinson-White (WPW) syndrome     Family History  Problem Relation Age of Onset  . Other Mother        unsure of history   . Dementia Father   . Dementia Sister   . Cancer Brother        lung    Past Surgical History:  Procedure Laterality Date  . ABDOMINAL HYSTERECTOMY    . BREAST SURGERY     RIGHT MASTECTOMY AND AXILLARY NODE DISSECTION  . JOINT REPLACEMENT     LEFT TOTAL KNEE REPLACEMENT   . KIDNEY TRANSPLANT  march 2012   right side - surgery at [place]  . ORIF PATELLA Right 07/22/2013   Procedure: OPEN REDUCTION INTERNAL (ORIF) FIXATION PATELLA;  Surgeon: Mauri Pole, MD;  Location: [facility];  Service: Orthopedics;  Laterality: Right;  . SINUS SURGERY WITH INSTATRAK  Social History   Occupational History  . Occupation: Retired  Tobacco Use  . Smoking status: Never Smoker  . Smokeless tobacco: Never Used  Substance and Sexual Activity  . Alcohol use: Yes    Comment: MAYBE ONE GLASS OF WINE EVERY FEW MONTHS  . Drug use: No  . Sexual activity: Not on file

## 2017-10-15 ENCOUNTER — Telehealth (INDEPENDENT_AMBULATORY_CARE_PROVIDER_SITE_OTHER): Payer: Self-pay | Admitting: Physical Medicine and Rehabilitation

## 2017-10-16 ENCOUNTER — Telehealth: Payer: Self-pay | Admitting: *Deleted

## 2017-10-16 NOTE — Telephone Encounter (Signed)
Received notification from [place] that pt has not transmitted in the last 72 hours.  Attempted unsuccessfully to contact pt by phone.  Left message to c/b to let us know if she is still wearing the monitor or to go ahead and transmit.

## 2017-10-17 NOTE — Telephone Encounter (Signed)
Received call from [place] with [facility].  The earliest available appt they have is 7/15. I explained I will call patient to see what they would like to do and will enter referral if decided.

## 2017-10-17 NOTE — Telephone Encounter (Signed)
[facility] appt with Dr. Ernestina Patches opened for 10/23/17.  Patient will take that appt.

## 2017-10-17 NOTE — Telephone Encounter (Signed)
I left voicemail for Angelita Ingles with [facility] [phone]) to see if they have availability sooner. Will wait for return call.

## 2017-10-22 MED FILL — SERTRALINE HCL 50 MG TABLET: 50 | 30 days supply | Qty: 30 | Fill #2

## 2017-10-22 NOTE — Telephone Encounter (Signed)
Spoke to husband (ok per DPR)-he states they have been out of town without signal.  He states he was told once they get a signal, the data would "dump".    She has been wearing the monitor and is still currently wearing the monitor.  Advised I would follow up with the monitor tech to confirm data is being recorded and transmitted.   He request a call back on # [phone] if anything further is needed.

## 2017-10-22 NOTE — Telephone Encounter (Signed)
Follow Up:    Pt is wearring her monitor, she have been out of town. Please call.

## 2017-10-23 ENCOUNTER — Ambulatory Visit (INDEPENDENT_AMBULATORY_CARE_PROVIDER_SITE_OTHER): Payer: Medicare Other | Admitting: Physical Medicine and Rehabilitation

## 2017-10-23 ENCOUNTER — Encounter (INDEPENDENT_AMBULATORY_CARE_PROVIDER_SITE_OTHER): Payer: Self-pay | Admitting: Physical Medicine and Rehabilitation

## 2017-10-23 ENCOUNTER — Encounter

## 2017-10-23 ENCOUNTER — Ambulatory Visit (INDEPENDENT_AMBULATORY_CARE_PROVIDER_SITE_OTHER): Payer: Self-pay

## 2017-10-23 VITALS — BP 126/68 | HR 76

## 2017-10-23 DIAGNOSIS — M5416 Radiculopathy, lumbar region: Secondary | ICD-10-CM | POA: Diagnosis not present

## 2017-10-23 MED ORDER — METHYLPREDNISOLONE ACETATE 80 MG/ML IJ SUSP
80.0000 mg | Freq: Once | INTRAMUSCULAR | Status: AC
  Administered 2017-10-23: 80 mg

## 2017-10-23 NOTE — Progress Notes (Signed)
 .  Numeric Pain Rating Scale and Functional Assessment Average Pain 5   In the last MONTH (on 0-10 scale) has pain interfered with the following?  1. General activity like being  able to carry out your everyday physical activities such as walking, climbing stairs, carrying groceries, or moving a chair?  Rating(6)   +Driver, -BT, -Dye Allergies.  

## 2017-10-23 NOTE — Telephone Encounter (Signed)
Informed [name] the last transmission on her event monitor was 10/11/2017.  I have called [facility] to confirm this.  Patient instructed to contact monitor company directly at [phone], so they may trouble shoot to see why it is not transmitting.

## 2017-10-23 NOTE — Patient Instructions (Signed)

## 2017-10-31 ENCOUNTER — Encounter (INDEPENDENT_AMBULATORY_CARE_PROVIDER_SITE_OTHER): Payer: Self-pay | Admitting: Physical Medicine and Rehabilitation

## 2017-11-02 NOTE — Procedures (Signed)
Lumbar Epidural Steroid Injection - Interlaminar Approach with Fluoroscopic Guidance  Patient: [name]      Date of Birth: [birth year] MRN: [id number] PCP: Deland Pretty, MD      Visit Date: 10/23/2017   Universal Protocol:     Consent Given By: the patient  Position: PRONE  Additional Comments: Vital signs were monitored before and after the procedure. Patient was prepped and draped in the usual sterile fashion. The correct patient, procedure, and site was verified.   Injection Procedure Details:  Procedure Site One Meds Administered:  Meds ordered this encounter  Medications  . methylPREDNISolone acetate (DEPO-MEDROL) injection 80 mg     Laterality: Right  Location/Site:  L5-S1  Needle size: 20 G  Needle type: Tuohy  Needle Placement: Paramedian epidural  Findings:   -Comments: Excellent flow of contrast into the epidural space.  Procedure Details: Using a paramedian approach from the side mentioned above, the region overlying the inferior lamina was localized under fluoroscopic visualization and the soft tissues overlying this structure were infiltrated with 4 ml. of 1% Lidocaine without Epinephrine. The Tuohy needle was inserted into the epidural space using a paramedian approach.   The epidural space was localized using loss of resistance along with lateral and bi-planar fluoroscopic views.  After negative aspirate for air, blood, and CSF, a 2 ml. volume of Isovue-250 was injected into the epidural space and the flow of contrast was observed. Radiographs were obtained for documentation purposes.    The injectate was administered into the level noted above.   Additional Comments:  The patient tolerated the procedure well Dressing: Band-Aid    Post-procedure details: Patient was observed during the procedure. Post-procedure instructions were reviewed.  Patient left the clinic in stable condition.

## 2017-11-02 NOTE — Progress Notes (Signed)
[name] - 84 y.o. female MRN [id number]  Date of birth: 13-Jul-1931  Office Visit Note: Visit Date: 10/23/2017 PCP: Deland Pretty, MD Referred by: Deland Pretty, MD  Subjective: Chief Complaint  Patient presents with  . Lower Back - Pain  . Right Leg - Pain   HPI: [name] is a very pleasant 84 year old female with chronic worsening low back and right hip and leg pain consistent with an L5 radicular pattern.  She comes in today at the request of Dr. Rodell Perna for epidural injection.  She has MRI findings showing pretty severe facet arthropathy with gaping facet joints at L4-5 with listhesis.  She has lateral recess narrowing on the right.  We will complete a right L5-S1 interlaminar epidural steroid injection based on her symptoms.  This is been going on since Easter and she is had therapy and medications without good relief.  Depending on relief would look at facet joint blocks or possibly medial branch blocks with radiofrequency ablation depending on Dr. Geraldo Docker evaluation.   ROS Otherwise per HPI.  Assessment & Plan: Visit Diagnoses:  1. Lumbar radiculopathy     Plan: No additional findings.   Meds & Orders:  Meds ordered this encounter  Medications  . methylPREDNISolone acetate (DEPO-MEDROL) injection 80 mg    Orders Placed This Encounter  Procedures  . XR C-ARM NO REPORT  . Epidural Steroid injection    Follow-up: Return if symptoms worsen or fail to improve.   Procedures: No procedures performed  Lumbar Epidural Steroid Injection - Interlaminar Approach with Fluoroscopic Guidance  Patient: [name]      Date of Birth: [birth year] MRN: [id number] PCP: Deland Pretty, MD      Visit Date: 10/23/2017   Universal Protocol:     Consent Given By: the patient  Position: PRONE  Additional Comments: Vital signs were monitored before and after the procedure. Patient was prepped and draped in the usual sterile fashion. The correct patient, procedure,  and site was verified.   Injection Procedure Details:  Procedure Site One Meds Administered:  Meds ordered this encounter  Medications  . methylPREDNISolone acetate (DEPO-MEDROL) injection 80 mg     Laterality: Right  Location/Site:  L5-S1  Needle size: 20 G  Needle type: Tuohy  Needle Placement: Paramedian epidural  Findings:   -Comments: Excellent flow of contrast into the epidural space.  Procedure Details: Using a paramedian approach from the side mentioned above, the region overlying the inferior lamina was localized under fluoroscopic visualization and the soft tissues overlying this structure were infiltrated with 4 ml. of 1% Lidocaine without Epinephrine. The Tuohy needle was inserted into the epidural space using a paramedian approach.   The epidural space was localized using loss of resistance along with lateral and bi-planar fluoroscopic views.  After negative aspirate for air, blood, and CSF, a 2 ml. volume of Isovue-250 was injected into the epidural space and the flow of contrast was observed. Radiographs were obtained for documentation purposes.    The injectate was administered into the level noted above.   Additional Comments:  The patient tolerated the procedure well Dressing: Band-Aid    Post-procedure details: Patient was observed during the procedure. Post-procedure instructions were reviewed.  Patient left the clinic in stable condition.   Clinical History: MRI LUMBAR SPINE WITHOUT CONTRAST  TECHNIQUE: Multiplanar, multisequence MR imaging of the lumbar spine was performed. No intravenous contrast was administered.  COMPARISON:  Radiography 09/25/2017  FINDINGS: Segmentation:  5 lumbar type vertebral  bodies.  Alignment:  4 mm anterolisthesis L4-5.  Vertebrae:  No fracture or primary bone finding.  Conus medullaris and cauda equina: Conus extends to the L1 level. Conus and cauda equina appear normal.  Paraspinal and other soft  tissues: Atrophic changes of the kidneys. Right iliac fossa transplant kidney.  Disc levels:  Normal at L2-3 and above.  L3-4: Mild bulging of the disc. Mild facet hypertrophy. No stenosis.  L4-5: Bilateral facet arthropathy with edema and joint effusions. 4 mm of anterolisthesis that could worsen with standing or flexion. Narrowing of both lateral recesses right more than left. Right L5 nerve root compression could occur in this location. Foraminal compromise on the right due to osteophyte and a foraminal to extraforaminal disc fragment likely to compress the right L4 nerve.  L5-S1: Mild bulging of the disc. Mild facet degeneration. No stenosis.  IMPRESSION: The significant findings are likely at L4-5 where there is advanced chronic bilateral facet arthropathy with joint edema and joint effusions allowing anterolisthesis of 4 mm. The disc bulges generally, and there is stenosis of the lateral recesses worse on the right, which could compress the right L5 nerve. There is right foraminal to extraforaminal disc herniation with a disc fragment that would seem likely to focally compress the right L4 nerve.   Electronically Signed   By: Nelson Chimes M.D.   On: 09/28/2017 11:21   She reports that she has never smoked. She has never used smokeless tobacco.  Recent Labs    06/06/17 0957  HGBA1C 5.4    Objective:  VS:  HT:    WT:   BMI:     BP:126/68  HR:76bpm  TEMP: ( )  RESP:  Physical Exam  Ortho Exam Imaging: No results found.  Past Medical/Family/Surgical/Social History: Medications & Allergies reviewed per EMR, new medications updated. Patient Active Problem List   Diagnosis Date Noted  . Syncope 10/03/2017  . History of breast cancer 10/03/2017  . Back pain 10/03/2017  . WPW (Wolff-Parkinson-White syndrome) 10/03/2017  . Polyneuropathy associated with underlying disease ([place]) 06/06/2017  . Mild cognitive impairment 04/23/2017  . Dizziness 04/23/2017   . Expected blood loss anemia 07/23/2013  . S/P Right patella ORIF 07/22/2013  . Renal transplant disorder 06/07/2012  . Unspecified hypothyroidism 06/07/2012  . Infection, atypical mycobacterium 06/07/2012   Past Medical History:  Diagnosis Date  . Arthritis   . Cancer ([place]) 1988   BREAST CANCER- MASTECTOMY RIGHT - NO CHEMO NO RADIATION  . CMV (cytomegalovirus infection) ([place])   . Depression with anxiety   . Dizziness   . Gout   . History of breast cancer   . Hypertension   . Hypothyroidism   . Iron deficiency anemia   . Memory loss   . Osteoarthritis   . Osteopenia   . Patella fracture   . Peripheral neuropathy   . Raynauds phenomenon   . Renal disorder    HX OF KIDNEY TRANSPLANT - STATES KIDNEY FUNCTION OK -DR. SANDFORD / DR. FOX -   [facility]  . Sinus problem    HX OF SINUS INFECTIONS   . Thyroid disease   . Vitamin D deficiency   . Wolff-Parkinson-White (WPW) syndrome    Family History  Problem Relation Age of Onset  . Other Mother        unsure of history   . Dementia Father   . Dementia Sister   . Cancer Brother        lung   Past  Surgical History:  Procedure Laterality Date  . ABDOMINAL HYSTERECTOMY    . BREAST SURGERY     RIGHT MASTECTOMY AND AXILLARY NODE DISSECTION  . JOINT REPLACEMENT     LEFT TOTAL KNEE REPLACEMENT   . KIDNEY TRANSPLANT  march 2012   right side - surgery at [facility]  . ORIF PATELLA Right 07/22/2013   Procedure: OPEN REDUCTION INTERNAL (ORIF) FIXATION PATELLA;  Surgeon: Mauri Pole, MD;  Location: [facility];  Service: Orthopedics;  Laterality: Right;  . SINUS SURGERY WITH INSTATRAK     Social History   Occupational History  . Occupation: Retired  Tobacco Use  . Smoking status: Never Smoker  . Smokeless tobacco: Never Used  Substance and Sexual Activity  . Alcohol use: Yes    Comment: MAYBE ONE GLASS OF WINE EVERY FEW MONTHS  . Drug use: No  . Sexual activity: Not on file

## 2017-11-05 MED FILL — TACROLIMUS 0.5 MG CAPSULE: 0.5 | 30 days supply | Qty: 60 | Fill #1

## 2017-11-05 MED FILL — LEVOTHYROXINE 50 MCG TABLET: 50 | 90 days supply | Qty: 90 | Fill #2

## 2017-11-05 MED FILL — TACROLIMUS 1 MG CAPSULE: 1 | 30 days supply | Qty: 60 | Fill #1

## 2017-11-05 MED FILL — MEMANTINE HCL 10 MG TABLET: 10 | 30 days supply | Qty: 60 | Fill #5

## 2017-11-05 MED FILL — SULFAMETHOXAZOLE-TMP SS TAB: 400-80 | 28 days supply | Qty: 12 | Fill #6

## 2017-11-05 MED FILL — MIRTAZAPINE 7.5 MG TABLET: 7.5 | 30 days supply | Qty: 30 | Fill #7

## 2017-11-07 ENCOUNTER — Encounter (INDEPENDENT_AMBULATORY_CARE_PROVIDER_SITE_OTHER): Payer: Self-pay | Admitting: Orthopaedic Surgery

## 2017-11-07 ENCOUNTER — Ambulatory Visit (INDEPENDENT_AMBULATORY_CARE_PROVIDER_SITE_OTHER): Payer: Medicare Other | Admitting: Orthopaedic Surgery

## 2017-11-07 VITALS — BP 153/80 | HR 84 | Ht 66.5 in | Wt 111.0 lb

## 2017-11-07 DIAGNOSIS — M5136 Other intervertebral disc degeneration, lumbar region: Secondary | ICD-10-CM

## 2017-11-07 NOTE — Progress Notes (Signed)
Office Visit Note   Patient: [name]           Date of Birth: 1931/10/27           MRN: [id number] Visit Date: 11/07/2017              Requested by: Deland Pretty, MD [address] [place], [place] [address] PCP: Deland Pretty, MD   Assessment & Plan: Visit Diagnoses:  1. Other intervertebral disc degeneration, lumbar region     Plan: Patient has some L4-5 degenerative anterolisthesis with some foraminal narrowing and foraminal disc protrusion.  He is gotten more than 50% improvement with the epidural.  She can call in a month or 2 she like a repeat injection.  Currently her symptoms are not severe enough to consider operative intervention.  He can return in 6 weeks.  Follow-Up Instructions: Return in about 6 weeks (around 12/19/2017).   Orders:  No orders of the defined types were placed in this encounter.  No orders of the defined types were placed in this encounter.     Procedures: No procedures performed   Clinical Data: No additional findings.   Subjective: Chief Complaint  Patient presents with  . Lower Back - Pain, Follow-up    S/P Lumbar ESI    HPI 84 year old female returns post epidural injection 10/23/2017 and states she got greater than 50% relief with the epidural.  She is walking better resting better.  She still has back and leg pain as before but not as severe.  Review of Systems14 pt ROS updated and unchanged from 09/25/17 OV other than mentioned in HPI   Objective: Vital Signs: BP (!) 153/80   Pulse 84   Ht 5' 6.5" (1.689 m)   Wt 111 lb (50.3 kg)   BMI 17.65 kg/m   Physical Exam  Constitutional: She is oriented to person, place, and time. She appears well-developed.  HENT:  Head: Normocephalic.  Right Ear: External ear normal.  Left Ear: External ear normal.  Eyes: Pupils are equal, round, and reactive to light.  Neck: No tracheal deviation present. No thyromegaly present.  Cardiovascular: Normal rate.    Pulmonary/Chest: Effort normal.  Abdominal: Soft.  Neurological: She is alert and oriented to person, place, and time.  Skin: Skin is warm and dry.  Psychiatric: She has a normal mood and affect. Her behavior is normal.    Ortho Exam patient can ambulate normal heel toe gait.  Negative straight leg raising.  Mild sciatic notch tenderness on the right side.  Reflexes are symmetrical knee and ankle jerk.  She ambulates with normal speed gait.  Specialty Comments:  No specialty comments available.  Imaging: No results found.   PMFS History: Patient Active Problem List   Diagnosis Date Noted  . Syncope 10/03/2017  . History of breast cancer 10/03/2017  . Back pain 10/03/2017  . WPW (Wolff-Parkinson-White syndrome) 10/03/2017  . Polyneuropathy associated with underlying disease ([place]) 06/06/2017  . Mild cognitive impairment 04/23/2017  . Dizziness 04/23/2017  . Expected blood loss anemia 07/23/2013  . S/P Right patella ORIF 07/22/2013  . Renal transplant disorder 06/07/2012  . Unspecified hypothyroidism 06/07/2012  . Infection, atypical mycobacterium 06/07/2012   Past Medical History:  Diagnosis Date  . Arthritis   . Cancer ([place]) 1988   BREAST CANCER- MASTECTOMY RIGHT - NO CHEMO NO RADIATION  . CMV (cytomegalovirus infection) ([place])   . Depression with anxiety   . Dizziness   . Gout   .  History of breast cancer   . Hypertension   . Hypothyroidism   . Iron deficiency anemia   . Memory loss   . Osteoarthritis   . Osteopenia   . Patella fracture   . Peripheral neuropathy   . Raynauds phenomenon   . Renal disorder    HX OF KIDNEY TRANSPLANT - STATES KIDNEY FUNCTION OK -DR. SANDFORD / DR. FOX -   [facility]  . Sinus problem    HX OF SINUS INFECTIONS   . Thyroid disease   . Vitamin D deficiency   . Wolff-Parkinson-White (WPW) syndrome     Family History  Problem Relation Age of Onset  . Other Mother        unsure of history   . Dementia Father   .  Dementia Sister   . Cancer Brother        lung    Past Surgical History:  Procedure Laterality Date  . ABDOMINAL HYSTERECTOMY    . BREAST SURGERY     RIGHT MASTECTOMY AND AXILLARY NODE DISSECTION  . JOINT REPLACEMENT     LEFT TOTAL KNEE REPLACEMENT   . KIDNEY TRANSPLANT  march 2012   right side - surgery at [facility]  . ORIF PATELLA Right 07/22/2013   Procedure: OPEN REDUCTION INTERNAL (ORIF) FIXATION PATELLA;  Surgeon: Mauri Pole, MD;  Location: [facility];  Service: Orthopedics;  Laterality: Right;  . SINUS SURGERY WITH INSTATRAK     Social History   Occupational History  . Occupation: Retired  Tobacco Use  . Smoking status: Never Smoker  . Smokeless tobacco: Never Used  Substance and Sexual Activity  . Alcohol use: Yes    Comment: MAYBE ONE GLASS OF WINE EVERY FEW MONTHS  . Drug use: No  . Sexual activity: Not on file

## 2017-11-21 ENCOUNTER — Telehealth (INDEPENDENT_AMBULATORY_CARE_PROVIDER_SITE_OTHER): Payer: Self-pay | Admitting: Orthopaedic Surgery

## 2017-11-21 NOTE — Telephone Encounter (Signed)
Patient requesting another back injection with Dr. Ernestina Patches. Patients # [phone]

## 2017-11-21 NOTE — Telephone Encounter (Signed)
Please advise. Ok to repeat?

## 2017-11-21 NOTE — Telephone Encounter (Signed)
Repeat versus facet joint injections

## 2017-11-21 NOTE — Telephone Encounter (Signed)
Scheduled for repeat right L5-S1 IL vs facets 11/29/17 at 1415 with driver and no blood thiners.

## 2017-11-21 NOTE — Telephone Encounter (Signed)
Left message for patient to call back to schedule.  °

## 2017-11-21 NOTE — Telephone Encounter (Addendum)
I spoke with patient's husband. He states at last visit with Dr. Lorin Mercy, he told patient if she wanted repeat injection in a couple of weeks to give him a call and we would set up.  Also noted in last office note he would repeat injection but note states in a month or two.  Could you please have Dr. Ernestina Patches advise since Dr. Lorin Mercy is out of the office?  OK to repeat injection at this time?

## 2017-11-27 MED FILL — SERTRALINE HCL 50 MG TABLET: 50 | 30 days supply | Qty: 30 | Fill #3

## 2017-11-27 MED FILL — SULFAMETHOXAZOLE-TMP SS TAB: 400-80 | 28 days supply | Qty: 12 | Fill #7

## 2017-11-29 ENCOUNTER — Ambulatory Visit (INDEPENDENT_AMBULATORY_CARE_PROVIDER_SITE_OTHER): Payer: Self-pay

## 2017-11-29 ENCOUNTER — Ambulatory Visit (INDEPENDENT_AMBULATORY_CARE_PROVIDER_SITE_OTHER): Payer: Medicare Other | Admitting: Physical Medicine and Rehabilitation

## 2017-11-29 ENCOUNTER — Encounter (INDEPENDENT_AMBULATORY_CARE_PROVIDER_SITE_OTHER): Payer: Self-pay | Admitting: Physical Medicine and Rehabilitation

## 2017-11-29 VITALS — BP 154/89 | HR 63

## 2017-11-29 DIAGNOSIS — M5416 Radiculopathy, lumbar region: Secondary | ICD-10-CM

## 2017-11-29 DIAGNOSIS — M47816 Spondylosis without myelopathy or radiculopathy, lumbar region: Secondary | ICD-10-CM | POA: Diagnosis not present

## 2017-11-29 MED ORDER — BETAMETHASONE SOD PHOS & ACET 6 (3-3) MG/ML IJ SUSP: 12.0000 mg | Freq: Once | INTRAMUSCULAR | Status: DC

## 2017-11-29 NOTE — Patient Instructions (Signed)

## 2017-11-29 NOTE — Progress Notes (Signed)
 .  Numeric Pain Rating Scale and Functional Assessment Average Pain 8   In the last MONTH (on 0-10 scale) has pain interfered with the following?  1. General activity like being  able to carry out your everyday physical activities such as walking, climbing stairs, carrying groceries, or moving a chair?  Rating(6)   +Driver, -BT, -Dye Allergies.  

## 2017-12-04 ENCOUNTER — Ambulatory Visit: Payer: Medicare Other | Admitting: Neurology

## 2017-12-04 ENCOUNTER — Encounter: Payer: Self-pay | Admitting: Neurology

## 2017-12-04 VITALS — BP 117/69 | HR 76 | Ht 66.5 in | Wt 108.0 lb

## 2017-12-04 DIAGNOSIS — G3184 Mild cognitive impairment, so stated: Secondary | ICD-10-CM | POA: Diagnosis not present

## 2017-12-04 DIAGNOSIS — R42 Dizziness and giddiness: Secondary | ICD-10-CM | POA: Diagnosis not present

## 2017-12-04 NOTE — Progress Notes (Signed)
HISTORY OF PRESENT ILLNESS: [name] is a 84 year old female, seen in refer by her primary care doctor Deland Pretty, for evaluation of memory loss, dizziness, initial evaluation was on April 23, 2017.  I reviewed and summarized the referring note, she has past medical history of hypothyroidism, on supplement, depression anxiety, breast cancer, hyperlipidemia, hypertension, osteopenia, kidney transplant secondary to chronic kidney disease in 2012, iron deficiency anemia,  Patient is alone at today's clinical visit, was noted to have a hesitation about providing detailed history, she is a retired Designer, multimedia at age 86, currently lives at [facility] assisted living with her husband, she drove to office today,  She reported significant family history of memory loss, father died of dementia, her elderly sister at age 49 also suffered significant memory loss, she noted gradual onset memory loss since 2018, she has word finding difficulties, today's Mini-Mental status examination 26/30, she missed 3 out of 3 recalls.  She also complains of dizziness for a few years, she has transient lightheadedness with sudden positional change, such as quick body movement, getting up quickly from sitting down position.  UPDATE December 04 2017: She is accompanied by her husband at today's clinical visit, who is a retired Technical brewer  She had few episode of passing out, initial episode was in April 2019, she was standing at the door frame, without warning signs, she fell to the ground, husband heard a loud bang, when he attended her, there was no seizure-like activity noticed,  The second episode was few weeks later on July 20, 2017, she fell to the bedside when she is trying to get out of the bed, no loss of consciousness, but it was difficult to get up, paramedic was called, she was taken to the emergency room,  Laboratory evaluation showed creatinine of 1.16, glucose was mildly  elevated 157, normal TSH, A1c 5.4, normal copper, ferritin level, negative troponin  CT head showed generalized atrophy, small vessel disease,  She was seen by cardiologist, echocardiogram on October 04, 2017 showed ejection fraction 65 to 70%,  EMG nerve conduction study in February 2019 showed mild axonal length dependent sensorimotor polyneuropathy  EEG was normal on Sep 11, 2017   She had 30 days cardiac monitoring, report and cardiology follow-up is pending  Her dizziness overall has much improved, she had about 20 pound weight loss over the past 2 years, husband reported she is not drinking enough fluid, patient has been put on drive limitations since her passing out spell in April 2019, continue worsening memory loss, Mini-Mental Status Examination 26 out of 51, animal naming 3, her father and elderly sister also suffered severe dementia  She also has depression,  REVIEW OF SYSTEMS: Out of a complete 14 system review of symptoms, the patient complains only of the following symptoms, and all other reviewed systems are negative.  Back pain ALLERGIES: Allergies  Allergen Reactions  . Cephalexin Hives  . Augmentin [Amoxicillin-Pot Clavulanate] Nausea And Vomiting  . Doxycycline Nausea And Vomiting  . Minocycline Hives and Other (See Comments)    Gave bladder infection     HOME MEDICATIONS: Outpatient Medications Prior to Visit  Medication Sig Dispense Refill  . acetaminophen (TYLENOL) 325 MG tablet Take 325 mg by mouth as needed (pain).     Marland Kitchen alendronate (FOSAMAX) 70 MG tablet Take 70 mg by mouth every 7 (seven) days.    Marland Kitchen amitriptyline (ELAVIL) 25 MG tablet Take 25 mg by mouth daily.     Marland Kitchen  Calcium Carbonate (CALCIUM 600 PO) Take 1 tablet by mouth 2 (two) times daily.    . cholecalciferol (VITAMIN D) 1000 UNITS tablet Take 2,000 Units by mouth daily.     Marland Kitchen denosumab (PROLIA) 60 MG/ML SOLN injection Inject 60 mg into the skin every 6 (six) months. Administer in upper arm, thigh,  or abdomen    . levothyroxine (SYNTHROID, LEVOTHROID) 50 MCG tablet Take 50 mcg by mouth daily.    . meclizine (ANTIVERT) 25 MG tablet Take 25 mg by mouth daily.     . memantine (NAMENDA) 10 MG tablet Take 1 tablet (10 mg total) by mouth 2 (two) times daily. 60 tablet 11  . mirtazapine (REMERON) 7.5 MG tablet   12  . predniSONE (DELTASONE) 5 MG tablet Take 10 mg by mouth daily.     . sertraline (ZOLOFT) 50 MG tablet   4  . sulfamethoxazole-trimethoprim (BACTRIM,SEPTRA) 400-80 MG per tablet Take 1 tablet by mouth daily. 30 tablet 11  . tacrolimus (PROGRAF) 1 MG capsule Take 2 mg by mouth 2 (two) times daily.     Marland Kitchen tiZANidine (ZANAFLEX) 4 MG tablet Take 4 mg by mouth as needed for muscle spasms.  0   Facility-Administered Medications Prior to Visit  Medication Dose Route Frequency Provider Last Rate Last Dose  . betamethasone acetate-betamethasone sodium phosphate (CELESTONE) injection 12 mg  12 mg Other Once Magnus Sinning, MD        PAST MEDICAL HISTORY: Past Medical History:  Diagnosis Date  . Arthritis   . Cancer ([place]) 1988   BREAST CANCER- MASTECTOMY RIGHT - NO CHEMO NO RADIATION  . CMV (cytomegalovirus infection) ([place])   . Depression with anxiety   . Dizziness   . Gout   . History of breast cancer   . Hypertension   . Hypothyroidism   . Iron deficiency anemia   . Memory loss   . Osteoarthritis   . Osteopenia   . Patella fracture   . Peripheral neuropathy   . Raynauds phenomenon   . Renal disorder    HX OF KIDNEY TRANSPLANT - STATES KIDNEY FUNCTION OK -DR. SANDFORD / DR. FOX -   [facility]  . Sinus problem    HX OF SINUS INFECTIONS   . Thyroid disease   . Vitamin D deficiency   . Wolff-Parkinson-White (WPW) syndrome     PAST SURGICAL HISTORY: Past Surgical History:  Procedure Laterality Date  . ABDOMINAL HYSTERECTOMY    . BREAST SURGERY     RIGHT MASTECTOMY AND AXILLARY NODE DISSECTION  . JOINT REPLACEMENT     LEFT TOTAL KNEE REPLACEMENT   . KIDNEY  TRANSPLANT  march 2012   right side - surgery at [place]  . ORIF PATELLA Right 07/22/2013   Procedure: OPEN REDUCTION INTERNAL (ORIF) FIXATION PATELLA;  Surgeon: Mauri Pole, MD;  Location: [facility];  Service: Orthopedics;  Laterality: Right;  . SINUS SURGERY WITH INSTATRAK      FAMILY HISTORY: Family History  Problem Relation Age of Onset  . Other Mother        unsure of history   . Dementia Father   . Dementia Sister   . Cancer Brother        lung    SOCIAL HISTORY: Social History   Socioeconomic History  . Marital status: Married    Spouse name: Not on file  . Number of children: 2  . Years of education: college  . Highest education level: Bachelor's degree (e.g.,  BA, AB, BS)  Occupational History  . Occupation: Retired  Scientific laboratory technician  . Financial resource strain: Not on file  . Food insecurity:    Worry: Not on file    Inability: Not on file  . Transportation needs:    Medical: Not on file    Non-medical: Not on file  Tobacco Use  . Smoking status: Never Smoker  . Smokeless tobacco: Never Used  Substance and Sexual Activity  . Alcohol use: Yes    Comment: MAYBE ONE GLASS OF WINE EVERY FEW MONTHS  . Drug use: No  . Sexual activity: Not on file  Lifestyle  . Physical activity:    Days per week: Not on file    Minutes per session: Not on file  . Stress: Not on file  Relationships  . Social connections:    Talks on phone: Not on file    Gets together: Not on file    Attends religious service: Not on file    Active member of club or organization: Not on file    Attends meetings of clubs or organizations: Not on file    Relationship status: Not on file  . Intimate partner violence:    Fear of current or ex partner: Not on file    Emotionally abused: Not on file    Physically abused: Not on file    Forced sexual activity: Not on file  Other Topics Concern  . Not on file  Social History Narrative   Lives at home with [facility] at [facility].    Right-handed.   Occasional caffeine use.      PHYSICAL EXAM  Vitals:   12/04/17 1039  BP: 117/69  Pulse: 76  Weight: 108 lb (49 kg)  Height: 5' 6.5" (1.689 m)   Body mass index is 17.17 kg/m.   MMSE - Mini Mental State Exam 12/04/2017 07/26/2017 04/23/2017  Orientation to time 4 5 4   Orientation to Place 5 5 5   Registration 3 3 3   Attention/ Calculation 5 5 5   Recall 0 1 0  Language- name 2 objects 2 2 2   Language- repeat 1 1 1   Language- follow 3 step command 3 3 3   Language- read & follow direction 1 1 1   Write a sentence 1 1 1   Copy design 1 1 1   Total score 26 28 26   animal naming 3.  Generalized: Well developed, in no acute distress   Neurological examination  Mentation: Alert oriented to time, place, history taking. Follows all commands speech and language fluent Cranial nerve II-XII: Pupils were equal round reactive to light. Extraocular movements were full, visual field were full on confrontational test. Facial sensation and strength were normal. Uvula tongue midline. Head turning and shoulder shrug  were normal and symmetric. Motor: Normal bulk strength, Sensory: Mild length dependent decreased vibratory sensation pinprick to above ankle level.  Coordination: Cerebellar testing reveals good finger-nose-finger and heel-to-shin bilaterally.  Gait and station: Gait is normal. Tandem gait slightly unsteady.  Romberg is negative. No drift is seen.  Reflexes: Deep tendon reflexes are hypoactive and symmetric bilaterally.   DIAGNOSTIC DATA (LABS, IMAGING, TESTING) - I reviewed patient records, labs, notes, testing and imaging myself where available.  Lab Results  Component Value Date   WBC 9.5 08/19/2017   HGB 15.1 (H) 08/19/2017   HCT 42.3 08/19/2017   MCV 91.4 08/19/2017   PLT 173 08/19/2017      Component Value Date/Time   NA 134 (L) 08/19/2017 1530  NA 138 04/23/2017 1050   K 4.0 08/19/2017 1530   CL 100 (L) 08/19/2017 1530   CO2 24 08/19/2017 1530    GLUCOSE 157 (H) 08/19/2017 1530   BUN 28 (H) 08/19/2017 1530   BUN 26 04/23/2017 1050   CREATININE 1.16 (H) 08/19/2017 1530   CALCIUM 9.1 08/19/2017 1530   PROT 7.0 06/06/2017 0957   ALBUMIN 4.5 04/23/2017 1050   AST 23 04/23/2017 1050   ALT 16 04/23/2017 1050   ALKPHOS 87 04/23/2017 1050   BILITOT 0.3 04/23/2017 1050   GFRNONAA 42 (L) 08/19/2017 1530   GFRAA 49 (L) 08/19/2017 1530    Lab Results  Component Value Date   HGBA1C 5.4 06/06/2017   Lab Results  Component Value Date   VITAMINB12 613 04/23/2017   Lab Results  Component Value Date   TSH 0.909 06/06/2017    ASSESSMENT AND PLAN 84 y.o. year old female   Mild cognitive impairment  Mini-Mental Status Examination 26 out of 34, animal naming of 3,  Significant family history of dementia  MRI of the brain showed significant atrophy, supratentorium small vessel disease  Continue Namenda 10 mg twice a day,  Not a good candidate for Aricept due to passing out spells, cardiac work-up is pending  Also complicated by her concurrent depression  Passing out spells  Differentiation diagnosis include orthostatic blood pressure changes,  Evidence of peripheral neuropathy  EEG was normal  Keep well hydration document all events, continue follow-up with cardiologist    Marcial Pacas, M.D. Ph.D.  [facility] [place], [place] [address] Phone: [phone] Fax:      [phone]

## 2017-12-06 MED FILL — MEMANTINE HCL 10 MG TABLET: 10 | 30 days supply | Qty: 60 | Fill #6

## 2017-12-06 MED FILL — TACROLIMUS 1 MG CAPSULE: 1 | 30 days supply | Qty: 60 | Fill #2

## 2017-12-06 MED FILL — MIRTAZAPINE 7.5 MG TABLET: 7.5 | 30 days supply | Qty: 30 | Fill #8

## 2017-12-06 MED FILL — TACROLIMUS 0.5 MG CAPSULE: 0.5 | 30 days supply | Qty: 60 | Fill #2

## 2017-12-10 ENCOUNTER — Ambulatory Visit: Payer: Medicare Other | Admitting: Cardiovascular Disease

## 2017-12-10 VITALS — BP 118/78 | HR 64 | Ht 67.0 in | Wt 109.0 lb

## 2017-12-10 DIAGNOSIS — I456 Pre-excitation syndrome: Secondary | ICD-10-CM | POA: Diagnosis not present

## 2017-12-10 DIAGNOSIS — E7800 Pure hypercholesterolemia, unspecified: Secondary | ICD-10-CM | POA: Diagnosis not present

## 2017-12-10 DIAGNOSIS — R55 Syncope and collapse: Secondary | ICD-10-CM | POA: Diagnosis not present

## 2017-12-10 NOTE — Progress Notes (Signed)
Cardiology Office Note   Date:  12/12/2017   ID:  [name], [name] 28-May-1931, MRN [id number]  PCP:  Deland Pretty, MD  Cardiologist:   Skeet Latch, MD   No chief complaint on file.     History of Present Illness: [name] is a 84 y.o. female with WPW, renal transplant (2012), breast cancer s/p mastectomy, hypothyroidism and dementia who presents for follow up.  She saw Kerin Ransom, PA-C on 09/2017 after episodes of recurrent syncope. She has experienced multiple episodes of syncope.  They started around April 2018.  The first occurred when she was standing in the doorway and she slid to the floor.  She injured her back but did not hit her head.  Another occurred when she was getting out of bed and got lightheaded and passed out.  She became diaphoretic and her husband had difficulty picking her up so EMS was called.  She is also had episodes when she stood up after working on a computer and felt like her legs were not working.  She did not pass out but was unable to move.  She notes that she has been eating and drinking as well lately.  She is also been losing weight due to lack of appetite.  She has started drinking Ensure.  She saw Kerin Ransom on 09/2017 and was referred for an echocardiogram 10/06/2017 that revealed LVEF 65 to 70% with grade 1 diastolic dysfunction and moderate tricuspid regurgitation.  She had a 30-day event monitor that revealed occasional PACs and PVCs but no significant arrhythmias.  She previously had an EKG in 2015 that was concerning for WPW.  She has not expands any chest pain or shortness of breath.  She denies any palpitations, lower extremity edema, orthopnea, or PND.  She has not had any kind of warning before she passes out.  She is also been evaluated by neurology due to concern that the episodes may be seizure.  However they do not seem to think that she actually has any seizure activity.   Past Medical History:  Diagnosis Date  . Arthritis     . Cancer ([place]) 1988   BREAST CANCER- MASTECTOMY RIGHT - NO CHEMO NO RADIATION  . CMV (cytomegalovirus infection) ([place])   . Depression with anxiety   . Dizziness   . Gout   . History of breast cancer   . Hypertension   . Hypothyroidism   . Iron deficiency anemia   . Memory loss   . Osteoarthritis   . Osteopenia   . Patella fracture   . Peripheral neuropathy   . Raynauds phenomenon   . Renal disorder    HX OF KIDNEY TRANSPLANT - STATES KIDNEY FUNCTION OK -DR. SANDFORD / DR. FOX -   [facility]  . Sinus problem    HX OF SINUS INFECTIONS   . Thyroid disease   . Vitamin D deficiency   . Wolff-Parkinson-White (WPW) syndrome     Past Surgical History:  Procedure Laterality Date  . ABDOMINAL HYSTERECTOMY    . BREAST SURGERY     RIGHT MASTECTOMY AND AXILLARY NODE DISSECTION  . JOINT REPLACEMENT     LEFT TOTAL KNEE REPLACEMENT   . KIDNEY TRANSPLANT  march 2012   right side - surgery at [place]  . ORIF PATELLA Right 07/22/2013   Procedure: OPEN REDUCTION INTERNAL (ORIF) FIXATION PATELLA;  Surgeon: Mauri Pole, MD;  Location: [facility];  Service: Orthopedics;  Laterality: Right;  .  SINUS SURGERY WITH INSTATRAK       Current Outpatient Medications  Medication Sig Dispense Refill  . acetaminophen (TYLENOL) 325 MG tablet Take 325 mg by mouth as needed (pain).     Marland Kitchen alendronate (FOSAMAX) 70 MG tablet Take 70 mg by mouth every 7 (seven) days.    . Calcium Carbonate (CALCIUM 600 PO) Take 1 tablet by mouth 2 (two) times daily.    . cholecalciferol (VITAMIN D) 1000 UNITS tablet Take 2,000 Units by mouth daily.     Marland Kitchen denosumab (PROLIA) 60 MG/ML SOLN injection Inject 60 mg into the skin every 6 (six) months. Administer in upper arm, thigh, or abdomen    . levothyroxine (SYNTHROID, LEVOTHROID) 50 MCG tablet Take 50 mcg by mouth daily.    . memantine (NAMENDA) 10 MG tablet Take 1 tablet (10 mg total) by mouth 2 (two) times daily. 60 tablet 11  . mirtazapine (REMERON)  7.5 MG tablet   12  . predniSONE (DELTASONE) 5 MG tablet Take 10 mg by mouth daily.     . sertraline (ZOLOFT) 50 MG tablet   4  . tacrolimus (PROGRAF) 1 MG capsule Take 2 mg by mouth 2 (two) times daily.      Current Facility-Administered Medications  Medication Dose Route Frequency Provider Last Rate Last Dose  . betamethasone acetate-betamethasone sodium phosphate (CELESTONE) injection 12 mg  12 mg Other Once Magnus Sinning, MD        Allergies:   Cephalexin; Augmentin [amoxicillin-pot clavulanate]; Doxycycline; and Minocycline    Social History:  The patient  reports that she has never smoked. She has never used smokeless tobacco. She reports that she drinks alcohol. She reports that she does not use drugs.   Family History:  The patient's family history includes Cancer in her brother; Dementia in her father and sister; Other in her mother.    ROS:  Please see the history of present illness.   Otherwise, review of systems are positive for none.   All other systems are reviewed and negative.    PHYSICAL EXAM: VS:  BP 118/78   Pulse 64   Ht 5\' 7"  (1.702 m)   Wt 109 lb (49.4 kg)   BMI 17.07 kg/m  , BMI Body mass index is 17.07 kg/m. GENERAL:  Well appearing HEENT:  Pupils equal round and reactive, fundi not visualized, oral mucosa unremarkable NECK:  No jugular venous distention, waveform within normal limits, carotid upstroke brisk and symmetric, no bruits LUNGS:  Clear to auscultation bilaterally HEART:  RRR.  PMI not displaced or sustained,S1 and S2 within normal limits, no S3, no S4, no clicks, no rubs, no murmurs ABD:  Flat, positive bowel sounds normal in frequency in pitch, no bruits, no rebound, no guarding, no midline pulsatile mass, no hepatomegaly, no splenomegaly EXT:  2 plus pulses throughout, no edema, no cyanosis no clubbing SKIN:  No rashes no nodules NEURO:  Cranial nerves II through XII grossly intact, motor grossly intact throughout PSYCH:  Cognitively  intact, oriented to person place and time   EKG:  EKG is not ordered today.   Recent Labs: 04/23/2017: ALT 16 06/06/2017: TSH 0.909 08/19/2017: BUN 28; Creatinine, Ser 1.16; Hemoglobin 15.1; Platelets 173; Potassium 4.0; Sodium 134    Lipid Panel No results found for: CHOL, TRIG, HDL, CHOLHDL, VLDL, LDLCALC, LDLDIRECT    Wt Readings from Last 3 Encounters:  12/10/17 109 lb (49.4 kg)  12/04/17 108 lb (49 kg)  11/07/17 111 lb (50.3 kg)  ASSESSMENT AND PLAN:  # Recurrent syncope:   # WPW: [name] has recurrent syncopal episodes.  The last was 4 months ago.  It seems that these were episodes of orthostatic hypotension in the setting of not eating and drinking well.  She has been losing weight and her appetite has been poor.  Since that time she is been eating and drinking more and has not had any recurrent symptoms.  There are no significant arrhythmias on her event monitor.  Although she does have a history of WPW it seems unlikely that any atrial arrhythmias were the cause of her symptoms.  Given her age and comorbidities we will not plan any EP interventions.  However I would not treat her with any nodal agents.  From a cardiac standpoint she is not cleared to drive until she has been event free for at least 6 months.  However I am more concerned about her dementia than her cardiovascular status. No driving 6 months   Current medicines are reviewed at length with the patient today.  The patient does not have concerns regarding medicines.  The following changes have been made:  no change  Labs/ tests ordered today include:  No orders of the defined types were placed in this encounter.    Disposition:   FU with Ivelis Norgard C. Oval Linsey, MD, [facility] in 6 months.      Signed, Ayva Veilleux C. Oval Linsey, MD, [facility]  12/12/2017 6:41 PM    [place]

## 2017-12-10 NOTE — Patient Instructions (Signed)
Medication Instructions:  Your physician recommends that you continue on your current medications as directed. Please refer to the Current Medication list given to you today.  Labwork: none  Testing/Procedures: none  Follow-Up: Your physician wants you to follow-up in: 6 months  You will receive a reminder letter in the mail two months in advance. If you don't receive a letter, please call our office to schedule the follow-up appointment.  If you need a refill on your cardiac medications before your next appointment, please call your pharmacy.  

## 2017-12-11 NOTE — Progress Notes (Signed)
[name] - 84 y.o. female MRN [id number]  Date of birth: 12/14/1931  Office Visit Note: Visit Date: 11/29/2017 PCP: Deland Pretty, MD Referred by: Deland Pretty, MD  Subjective: Chief Complaint  Patient presents with  . Lower Back - Pain  . Right Leg - Pain   HPI: [name] is a 84 year old female with chronic worsening low back pain worse with standing and facet joint loading.  Prior epidural injection which was an interlaminar injection gave her some relief but did not last long but did help at first.  Her back pain really is worse than her right hip and leg pain.  She does have some lateral recess narrowing at L4-5 right worse than left.  We are going to try facet joint injection bilaterally and hopefully this will be diagnostic and therapeutic.  Depending on relief would possibly look at transforaminal epidural.   ROS Otherwise per HPI.  Assessment & Plan: Visit Diagnoses:  1. Spondylosis without myelopathy or radiculopathy, lumbar region   2. Lumbar radiculopathy     Plan: No additional findings.   Meds & Orders:  Meds ordered this encounter  Medications  . betamethasone acetate-betamethasone sodium phosphate (CELESTONE) injection 12 mg    Orders Placed This Encounter  Procedures  . Facet Injection  . XR C-ARM NO REPORT    Follow-up: Return if symptoms worsen or fail to improve.   Procedures: No procedures performed  Lumbar Facet Joint Intra-Articular Injection(s) with Fluoroscopic Guidance  Patient: [name]      Date of Birth: [birth year] MRN: [id number] PCP: Deland Pretty, MD      Visit Date: 11/29/2017   Universal Protocol:    Date/Time: 11/29/2017  Consent Given By: the patient  Position: PRONE   Additional Comments: Vital signs were monitored before and after the procedure. Patient was prepped and draped in the usual sterile fashion. The correct patient, procedure, and site was verified.   Injection Procedure Details:  Procedure  Site One Meds Administered:  Meds ordered this encounter  Medications  . betamethasone acetate-betamethasone sodium phosphate (CELESTONE) injection 12 mg     Laterality: Bilateral  Location/Site:  L4-L5  Needle size: 22 guage  Needle type: Spinal  Needle Placement: Articular  Findings:  -Comments: Excellent flow of contrast producing a partial arthrogram.  Procedure Details: The fluoroscope beam is vertically oriented in AP, and the inferior recess is visualized beneath the lower pole of the inferior apophyseal process, which represents the target point for needle insertion. When direct visualization is difficult the target point is located at the medial projection of the vertebral pedicle. The region overlying each aforementioned target is locally anesthetized with a 1 to 2 ml. volume of 1% Lidocaine without Epinephrine.   The spinal needle was inserted into each of the above mentioned facet joints using biplanar fluoroscopic guidance. A 0.25 to 0.5 ml. volume of Isovue-250 was injected and a partial facet joint arthrogram was obtained. A single spot film was obtained of the resulting arthrogram.    One to 1.25 ml of the steroid/anesthetic solution was then injected into each of the facet joints noted above.   Additional Comments:  The patient tolerated the procedure well Dressing: Band-Aid    Post-procedure details: Patient was observed during the procedure. Post-procedure instructions were reviewed.  Patient left the clinic in stable condition.    Clinical History: MRI LUMBAR SPINE WITHOUT CONTRAST  TECHNIQUE: Multiplanar, multisequence MR imaging of the lumbar spine was performed. No intravenous contrast was administered.  COMPARISON:  Radiography 09/25/2017  FINDINGS: Segmentation:  5 lumbar type vertebral bodies.  Alignment:  4 mm anterolisthesis L4-5.  Vertebrae:  No fracture or primary bone finding.  Conus medullaris and cauda equina: Conus extends  to the L1 level. Conus and cauda equina appear normal.  Paraspinal and other soft tissues: Atrophic changes of the kidneys. Right iliac fossa transplant kidney.  Disc levels:  Normal at L2-3 and above.  L3-4: Mild bulging of the disc. Mild facet hypertrophy. No stenosis.  L4-5: Bilateral facet arthropathy with edema and joint effusions. 4 mm of anterolisthesis that could worsen with standing or flexion. Narrowing of both lateral recesses right more than left. Right L5 nerve root compression could occur in this location. Foraminal compromise on the right due to osteophyte and a foraminal to extraforaminal disc fragment likely to compress the right L4 nerve.  L5-S1: Mild bulging of the disc. Mild facet degeneration. No stenosis.  IMPRESSION: The significant findings are likely at L4-5 where there is advanced chronic bilateral facet arthropathy with joint edema and joint effusions allowing anterolisthesis of 4 mm. The disc bulges generally, and there is stenosis of the lateral recesses worse on the right, which could compress the right L5 nerve. There is right foraminal to extraforaminal disc herniation with a disc fragment that would seem likely to focally compress the right L4 nerve.   Electronically Signed   By: Nelson Chimes M.D.   On: 09/28/2017 11:21   She reports that she has never smoked. She has never used smokeless tobacco.  Recent Labs    06/06/17 0957  HGBA1C 5.4    Objective:  VS:  HT:    WT:   BMI:     BP:(!) 154/89  HR:63bpm  TEMP: ( )  RESP:  Physical Exam  Ortho Exam Imaging: No results found.  Past Medical/Family/Surgical/Social History: Medications & Allergies reviewed per EMR, new medications updated. Patient Active Problem List   Diagnosis Date Noted  . Syncope 10/03/2017  . History of breast cancer 10/03/2017  . Back pain 10/03/2017  . WPW (Wolff-Parkinson-White syndrome) 10/03/2017  . Polyneuropathy associated with underlying  disease ([place]) 06/06/2017  . Mild cognitive impairment 04/23/2017  . Dizziness 04/23/2017  . Expected blood loss anemia 07/23/2013  . S/P Right patella ORIF 07/22/2013  . Renal transplant disorder 06/07/2012  . Unspecified hypothyroidism 06/07/2012  . Infection, atypical mycobacterium 06/07/2012   Past Medical History:  Diagnosis Date  . Arthritis   . Cancer ([place]) 1988   BREAST CANCER- MASTECTOMY RIGHT - NO CHEMO NO RADIATION  . CMV (cytomegalovirus infection) ([place])   . Depression with anxiety   . Dizziness   . Gout   . History of breast cancer   . Hypertension   . Hypothyroidism   . Iron deficiency anemia   . Memory loss   . Osteoarthritis   . Osteopenia   . Patella fracture   . Peripheral neuropathy   . Raynauds phenomenon   . Renal disorder    HX OF KIDNEY TRANSPLANT - STATES KIDNEY FUNCTION OK -DR. SANDFORD / DR. FOX -   [facility]  . Sinus problem    HX OF SINUS INFECTIONS   . Thyroid disease   . Vitamin D deficiency   . Wolff-Parkinson-White (WPW) syndrome    Family History  Problem Relation Age of Onset  . Other Mother        unsure of history   . Dementia Father   . Dementia Sister   .  Cancer Brother        lung   Past Surgical History:  Procedure Laterality Date  . ABDOMINAL HYSTERECTOMY    . BREAST SURGERY     RIGHT MASTECTOMY AND AXILLARY NODE DISSECTION  . JOINT REPLACEMENT     LEFT TOTAL KNEE REPLACEMENT   . KIDNEY TRANSPLANT  march 2012   right side - surgery at [place]  . ORIF PATELLA Right 07/22/2013   Procedure: OPEN REDUCTION INTERNAL (ORIF) FIXATION PATELLA;  Surgeon: Mauri Pole, MD;  Location: [facility];  Service: Orthopedics;  Laterality: Right;  . SINUS SURGERY WITH INSTATRAK     Social History   Occupational History  . Occupation: Retired  Tobacco Use  . Smoking status: Never Smoker  . Smokeless tobacco: Never Used  Substance and Sexual Activity  . Alcohol use: Yes    Comment: MAYBE ONE GLASS OF WINE EVERY  FEW MONTHS  . Drug use: No  . Sexual activity: Not on file

## 2017-12-11 NOTE — Procedures (Signed)
Lumbar Facet Joint Intra-Articular Injection(s) with Fluoroscopic Guidance  Patient: [name]      Date of Birth: 08-Nov-1931 MRN: [id number] PCP: Deland Pretty, MD      Visit Date: 11/29/2017   Universal Protocol:    Date/Time: 11/29/2017  Consent Given By: the patient  Position: PRONE   Additional Comments: Vital signs were monitored before and after the procedure. Patient was prepped and draped in the usual sterile fashion. The correct patient, procedure, and site was verified.   Injection Procedure Details:  Procedure Site One Meds Administered:  Meds ordered this encounter  Medications  . betamethasone acetate-betamethasone sodium phosphate (CELESTONE) injection 12 mg     Laterality: Bilateral  Location/Site:  L4-L5  Needle size: 22 guage  Needle type: Spinal  Needle Placement: Articular  Findings:  -Comments: Excellent flow of contrast producing a partial arthrogram.  Procedure Details: The fluoroscope beam is vertically oriented in AP, and the inferior recess is visualized beneath the lower pole of the inferior apophyseal process, which represents the target point for needle insertion. When direct visualization is difficult the target point is located at the medial projection of the vertebral pedicle. The region overlying each aforementioned target is locally anesthetized with a 1 to 2 ml. volume of 1% Lidocaine without Epinephrine.   The spinal needle was inserted into each of the above mentioned facet joints using biplanar fluoroscopic guidance. A 0.25 to 0.5 ml. volume of Isovue-250 was injected and a partial facet joint arthrogram was obtained. A single spot film was obtained of the resulting arthrogram.    One to 1.25 ml of the steroid/anesthetic solution was then injected into each of the facet joints noted above.   Additional Comments:  The patient tolerated the procedure well Dressing: Band-Aid    Post-procedure details: Patient was observed  during the procedure. Post-procedure instructions were reviewed.  Patient left the clinic in stable condition.

## 2017-12-12 ENCOUNTER — Encounter: Payer: Self-pay | Admitting: Cardiovascular Disease

## 2017-12-13 ENCOUNTER — Ambulatory Visit: Payer: Medicare Other | Admitting: Adult Health

## 2017-12-19 ENCOUNTER — Ambulatory Visit (INDEPENDENT_AMBULATORY_CARE_PROVIDER_SITE_OTHER): Payer: Medicare Other | Admitting: Orthopaedic Surgery

## 2017-12-19 ENCOUNTER — Encounter (INDEPENDENT_AMBULATORY_CARE_PROVIDER_SITE_OTHER): Payer: Self-pay | Admitting: Orthopaedic Surgery

## 2017-12-19 VITALS — BP 137/75 | HR 70 | Ht 66.5 in | Wt 109.0 lb

## 2017-12-19 DIAGNOSIS — M47816 Spondylosis without myelopathy or radiculopathy, lumbar region: Secondary | ICD-10-CM | POA: Diagnosis not present

## 2017-12-19 DIAGNOSIS — M48061 Spinal stenosis, lumbar region without neurogenic claudication: Secondary | ICD-10-CM | POA: Diagnosis not present

## 2017-12-19 NOTE — Progress Notes (Signed)
Office Visit Note   Patient: [name]           Date of Birth: 1932-01-08           MRN: [id number] Visit Date: 12/19/2017              Requested by: Deland Pretty, MD [address] [place], [place] [address] PCP: Deland Pretty, MD   Assessment & Plan: Visit Diagnoses:  1. Facet arthropathy, lumbar   2. Stenosis of lateral recess of lumbar spine     Plan: Good improvement with L4-5 facet injections.  She has some lateral recess stenosis with 4 mm anterolisthesis.  I plan to recheck her again in 3 to 4 months.  She will call if her pain recurs and she needs a second injection in the facet joints.  Follow-Up Instructions: Return in about 3 months (around 03/20/2018).   Orders:  No orders of the defined types were placed in this encounter.  No orders of the defined types were placed in this encounter.     Procedures: No procedures performed   Clinical Data: No additional findings.   Subjective: Chief Complaint  Patient presents with  . Lower Back - Pain, Follow-up    S/P lumbar facet injections 11/29/17 S/P lumbar ESI 10/23/17    HPI 84 year old female states she has about 60 to 70% residual improvement after facet injection several weeks ago.  Initially she had may be 80% improvement.  She has some lateral recess stenosis with 4 mm anterolisthesis which has minimal change with flexion extension lateral images.  Facet injections have given her good relief and this was done on 11/29/2017 and lumbar epidural on 10/23/2017.  Facet injection did much better than interlaminar injection.  She is walking better can go to a quarter of a mile to half mile without problems.  She is doing. some lower aerobic exercises at [place] 14 point update unchanged from 09/25/2017 office visit other than mentioned in HPI.   Objective: Vital Signs: BP 137/75   Pulse 70   Ht 5' 6.5" (1.689 m)   Wt 109 lb (49.4 kg)   BMI 17.33 kg/m   Physical Exam    Constitutional: She is oriented to person, place, and time. She appears well-developed.  HENT:  Head: Normocephalic.  Right Ear: External ear normal.  Left Ear: External ear normal.  Eyes: Pupils are equal, round, and reactive to light.  Neck: No tracheal deviation present. No thyromegaly present.  Cardiovascular: Normal rate.  Pulmonary/Chest: Effort normal.  Abdominal: Soft.  Neurological: She is alert and oriented to person, place, and time.  Skin: Skin is warm and dry.  Psychiatric: She has a normal mood and affect. Her behavior is normal.    Ortho Exam patient gets from sitting standing negative straight leg raising no pain with hip range of motion.  No atrophy no rash over exposed skin knees reach full extension symmetrical reflexes.  No synovitis of the wrist or fingers.  Specialty Comments:  No specialty comments available.  Imaging: No results found.   PMFS History: Patient Active Problem List   Diagnosis Date Noted  . Facet arthropathy, lumbar 12/19/2017  . Syncope 10/03/2017  . History of breast cancer 10/03/2017  . Back pain 10/03/2017  . WPW (Wolff-Parkinson-White syndrome) 10/03/2017  . Polyneuropathy associated with underlying disease ([place]) 06/06/2017  . Mild cognitive impairment 04/23/2017  . Dizziness 04/23/2017  . Expected blood loss anemia 07/23/2013  . S/P  Right patella ORIF 07/22/2013  . Renal transplant disorder 06/07/2012  . Unspecified hypothyroidism 06/07/2012  . Infection, atypical mycobacterium 06/07/2012   Past Medical History:  Diagnosis Date  . Arthritis   . Cancer ([place]) 1988   BREAST CANCER- MASTECTOMY RIGHT - NO CHEMO NO RADIATION  . CMV (cytomegalovirus infection) ([place])   . Depression with anxiety   . Dizziness   . Gout   . History of breast cancer   . Hypertension   . Hypothyroidism   . Iron deficiency anemia   . Memory loss   . Osteoarthritis   . Osteopenia   . Patella fracture   . Peripheral neuropathy   . Raynauds  phenomenon   . Renal disorder    HX OF KIDNEY TRANSPLANT - STATES KIDNEY FUNCTION OK -DR. SANDFORD / DR. FOX -   [facility]  . Sinus problem    HX OF SINUS INFECTIONS   . Thyroid disease   . Vitamin D deficiency   . Wolff-Parkinson-White (WPW) syndrome     Family History  Problem Relation Age of Onset  . Other Mother        unsure of history   . Dementia Father   . Dementia Sister   . Cancer Brother        lung    Past Surgical History:  Procedure Laterality Date  . ABDOMINAL HYSTERECTOMY    . BREAST SURGERY     RIGHT MASTECTOMY AND AXILLARY NODE DISSECTION  . JOINT REPLACEMENT     LEFT TOTAL KNEE REPLACEMENT   . KIDNEY TRANSPLANT  march 2012   right side - surgery at [place]  . ORIF PATELLA Right 07/22/2013   Procedure: OPEN REDUCTION INTERNAL (ORIF) FIXATION PATELLA;  Surgeon: Mauri Pole, MD;  Location: [facility];  Service: Orthopedics;  Laterality: Right;  . SINUS SURGERY WITH INSTATRAK     Social History   Occupational History  . Occupation: Retired  Tobacco Use  . Smoking status: Never Smoker  . Smokeless tobacco: Never Used  Substance and Sexual Activity  . Alcohol use: Yes    Comment: MAYBE ONE GLASS OF WINE EVERY FEW MONTHS  . Drug use: No  . Sexual activity: Not on file

## 2017-12-27 MED FILL — AZITHROMYCIN 250 MG TABLET: 250 | 5 days supply | Qty: 6 | Fill #0

## 2018-01-02 MED FILL — TACROLIMUS 0.5 MG CAPSULE: 0.5 | 30 days supply | Qty: 60 | Fill #0

## 2018-01-02 MED FILL — SULFAMETHOXAZOLE-TMP SS TAB: 400-80 | 28 days supply | Qty: 12 | Fill #8

## 2018-01-02 MED FILL — MEMANTINE HCL 10 MG TABLET: 10 | 30 days supply | Qty: 60 | Fill #7

## 2018-01-02 MED FILL — MIRTAZAPINE 7.5 MG TABLET: 7.5 | 30 days supply | Qty: 30 | Fill #9

## 2018-01-02 MED FILL — SERTRALINE HCL 50 MG TABLET: 50 | 30 days supply | Qty: 30 | Fill #4

## 2018-01-02 MED FILL — TACROLIMUS 1 MG CAPSULE: 1 | 30 days supply | Qty: 60 | Fill #0

## 2018-01-11 ENCOUNTER — Telehealth (INDEPENDENT_AMBULATORY_CARE_PROVIDER_SITE_OTHER): Payer: Self-pay | Admitting: Radiology

## 2018-01-11 ENCOUNTER — Telehealth (INDEPENDENT_AMBULATORY_CARE_PROVIDER_SITE_OTHER): Payer: Self-pay | Admitting: *Deleted

## 2018-01-11 NOTE — Telephone Encounter (Signed)
Canceled pt appt with Dr. Lorin Mercy, pt was scheduled wrong. Per Dr. Ernestina Patches called pt and left vm #1 to schedule her for L5-S1 facet inj.

## 2018-01-14 ENCOUNTER — Telehealth (INDEPENDENT_AMBULATORY_CARE_PROVIDER_SITE_OTHER): Payer: Self-pay | Admitting: *Deleted

## 2018-01-14 NOTE — Telephone Encounter (Signed)
Per [facility] website [phone] in outpatient setting no prior authorization is needed.

## 2018-01-15 ENCOUNTER — Ambulatory Visit (INDEPENDENT_AMBULATORY_CARE_PROVIDER_SITE_OTHER): Payer: Medicare Other | Admitting: Orthopaedic Surgery

## 2018-01-21 MED FILL — AMOX-CLAV 875-125 MG TABLET: 875-125 | 10 days supply | Qty: 20 | Fill #0

## 2018-01-21 MED FILL — BENZONATATE 200 MG CAPS: 200 | 14 days supply | Qty: 42 | Fill #0

## 2018-01-21 NOTE — Telephone Encounter (Signed)
error 

## 2018-01-31 ENCOUNTER — Ambulatory Visit (INDEPENDENT_AMBULATORY_CARE_PROVIDER_SITE_OTHER): Payer: Self-pay

## 2018-01-31 ENCOUNTER — Encounter (INDEPENDENT_AMBULATORY_CARE_PROVIDER_SITE_OTHER): Payer: Self-pay | Admitting: Physical Medicine and Rehabilitation

## 2018-01-31 ENCOUNTER — Ambulatory Visit (INDEPENDENT_AMBULATORY_CARE_PROVIDER_SITE_OTHER): Payer: Medicare Other | Admitting: Physical Medicine and Rehabilitation

## 2018-01-31 VITALS — BP 98/48 | HR 82 | Temp 97.7°F

## 2018-01-31 DIAGNOSIS — M545 Low back pain: Secondary | ICD-10-CM | POA: Diagnosis not present

## 2018-01-31 DIAGNOSIS — G8929 Other chronic pain: Secondary | ICD-10-CM

## 2018-01-31 DIAGNOSIS — M47816 Spondylosis without myelopathy or radiculopathy, lumbar region: Secondary | ICD-10-CM | POA: Diagnosis not present

## 2018-01-31 MED ORDER — METHYLPREDNISOLONE ACETATE 80 MG/ML IJ SUSP
80.0000 mg | Freq: Once | INTRAMUSCULAR | Status: AC
  Administered 2018-01-31: 80 mg

## 2018-01-31 NOTE — Progress Notes (Signed)
   Numeric Pain Rating Scale and Functional Assessment Average Pain 10   In the last MONTH (on 0-10 scale) has pain interfered with the following?  1. General activity like being  able to carry out your everyday physical activities such as walking, climbing stairs, carrying groceries, or moving a chair?  Rating(8)   +Driver, -BT, -Dye Allergies.  

## 2018-01-31 NOTE — Patient Instructions (Signed)

## 2018-02-04 MED FILL — TACROLIMUS 1 MG CAPSULE: 1 | 30 days supply | Qty: 60 | Fill #1

## 2018-02-05 MED FILL — TACROLIMUS 0.5 MG CAPSULE: 0.5 | 30 days supply | Qty: 60 | Fill #1

## 2018-02-05 MED FILL — SULFAMETHOXAZOLE-TMP SS TAB: 400-80 | 28 days supply | Qty: 12 | Fill #9

## 2018-02-05 MED FILL — SERTRALINE HCL 50 MG TABLET: 50 | 30 days supply | Qty: 30 | Fill #5

## 2018-02-06 MED FILL — LEVOTHYROXINE 50 MCG TABLET: 50 | 90 days supply | Qty: 90 | Fill #3

## 2018-02-06 MED FILL — MEMANTINE HCL 10 MG TABLET: 10 | 30 days supply | Qty: 60 | Fill #8

## 2018-02-12 NOTE — Procedures (Signed)
Lumbar Diagnostic Facet Joint Nerve Block with Fluoroscopic Guidance   Patient: [name]      Date of Birth: 22-Dec-1931 MRN: [id number] PCP: Deland Pretty, MD      Visit Date: 01/31/2018   Universal Protocol:    Date/Time: 10/29/195:27 AM  Consent Given By: the patient  Position: PRONE  Additional Comments: Vital signs were monitored before and after the procedure. Patient was prepped and draped in the usual sterile fashion. The correct patient, procedure, and site was verified.   Injection Procedure Details:  Procedure Site One Meds Administered:  Meds ordered this encounter  Medications  . methylPREDNISolone acetate (DEPO-MEDROL) injection 80 mg     Laterality: Bilateral  Location/Site:  L4-L5  Needle size: 22 ga.  Needle type:spinal  Needle Placement: Oblique pedical  Findings:   -Comments: There was excellent flow of contrast along the articular pillars without intravascular flow.  Procedure Details: The fluoroscope beam is vertically oriented in AP and then obliqued 15 to 20 degrees to the ipsilateral side of the desired nerve to achieve the "Scotty dog" appearance.  The skin over the target area of the junction of the superior articulating process and the transverse process (sacral ala if blocking the L5 dorsal rami) was locally anesthetized with a 1 ml volume of 1% Lidocaine without Epinephrine.  The spinal needle was inserted and advanced in a trajectory view down to the target.   After contact with periosteum and negative aspirate for blood and CSF, correct placement without intravascular or epidural spread was confirmed by injecting 0.5 ml. of Isovue-250.  A spot radiograph was obtained of this image.    Next, a 0.5 ml. volume of the injectate described above was injected. The needle was then redirected to the other facet joint nerves mentioned above if needed.  Prior to the procedure, the patient was given a Pain Diary which was completed for  baseline measurements.  After the procedure, the patient rated their pain every 30 minutes and will continue rating at this frequency for a total of 5 hours.  The patient has been asked to complete the Diary and return to [place] by mail, fax or hand delivered as soon as possible.   Additional Comments:  The patient tolerated the procedure well Dressing: Band-Aid    Post-procedure details: Patient was observed during the procedure. Post-procedure instructions were reviewed.  Patient left the clinic in stable condition.

## 2018-02-12 NOTE — Progress Notes (Signed)
[name] - 84 y.o. female MRN [id number]  Date of birth: Jan 29, 1932  Office Visit Note: Visit Date: 01/31/2018 PCP: Deland Pretty, MD Referred by: Deland Pretty, MD  Subjective: Chief Complaint  Patient presents with  . Lower Back - Pain  . Right Leg - Pain   HPI:  CLETIS MUMA is a 84 y.o. female who comes in today For planned bilateral L4-5 diagnostic and hopefully therapeutic facet/medial branch blocks.  She is followed by Dr. Lorin Mercy for orthopedic and spine care.  We saw her on one occasion and completed epidural injection which was somewhat beneficial but really her back pain is the predominant factor.  She does get some pain radiating to the right hip.  She has pretty significant facet arthropathy.  She has pain with facet joint loading on exam pain going from sit to stand consistent with facet mediated pain.  She has had chronic pain ongoing now with her back for many months to many years off and on.  MRI shows significant facet arthropathy with small listhesis without stenosis.  She has tried and failed conservative care and activity modification as well as medication management.  All those notes can be reviewed.  We are going to complete his second diagnostic facet joint block today and if she does well with that we would try to look at potentially radiofrequency ablation.  ROS Otherwise per HPI.  Assessment & Plan: Visit Diagnoses:  1. Spondylosis without myelopathy or radiculopathy, lumbar region   2. Chronic bilateral low back pain without sciatica     Plan: No additional findings.   Meds & Orders:  Meds ordered this encounter  Medications  . methylPREDNISolone acetate (DEPO-MEDROL) injection 80 mg    Orders Placed This Encounter  Procedures  . Facet Injection  . XR C-ARM NO REPORT    Follow-up: Return if symptoms worsen or fail to improve, for Consider facet ablation.   Procedures: No procedures performed  Lumbar Diagnostic Facet Joint Nerve Block with  Fluoroscopic Guidance   Patient: CECILLIA MENEES      Date of Birth: June 01, 1931 MRN: [id number] PCP: Deland Pretty, MD      Visit Date: 01/31/2018   Universal Protocol:    Date/Time: 10/29/195:27 AM  Consent Given By: the patient  Position: PRONE  Additional Comments: Vital signs were monitored before and after the procedure. Patient was prepped and draped in the usual sterile fashion. The correct patient, procedure, and site was verified.   Injection Procedure Details:  Procedure Site One Meds Administered:  Meds ordered this encounter  Medications  . methylPREDNISolone acetate (DEPO-MEDROL) injection 80 mg     Laterality: Bilateral  Location/Site:  L4-L5  Needle size: 22 ga.  Needle type:spinal  Needle Placement: Oblique pedical  Findings:   -Comments: There was excellent flow of contrast along the articular pillars without intravascular flow.  Procedure Details: The fluoroscope beam is vertically oriented in AP and then obliqued 15 to 20 degrees to the ipsilateral side of the desired nerve to achieve the "Scotty dog" appearance.  The skin over the target area of the junction of the superior articulating process and the transverse process (sacral ala if blocking the L5 dorsal rami) was locally anesthetized with a 1 ml volume of 1% Lidocaine without Epinephrine.  The spinal needle was inserted and advanced in a trajectory view down to the target.   After contact with periosteum and negative aspirate for blood and CSF, correct placement without intravascular or epidural spread was  confirmed by injecting 0.5 ml. of Isovue-250.  A spot radiograph was obtained of this image.    Next, a 0.5 ml. volume of the injectate described above was injected. The needle was then redirected to the other facet joint nerves mentioned above if needed.  Prior to the procedure, the patient was given a Pain Diary which was completed for baseline measurements.  After the procedure, the  patient rated their pain every 30 minutes and will continue rating at this frequency for a total of 5 hours.  The patient has been asked to complete the Diary and return to [place] by mail, fax or hand delivered as soon as possible.   Additional Comments:  The patient tolerated the procedure well Dressing: Band-Aid    Post-procedure details: Patient was observed during the procedure. Post-procedure instructions were reviewed.  Patient left the clinic in stable condition.   Clinical History: MRI LUMBAR SPINE WITHOUT CONTRAST  TECHNIQUE: Multiplanar, multisequence MR imaging of the lumbar spine was performed. No intravenous contrast was administered.  COMPARISON:  Radiography 09/25/2017  FINDINGS: Segmentation:  5 lumbar type vertebral bodies.  Alignment:  4 mm anterolisthesis L4-5.  Vertebrae:  No fracture or primary bone finding.  Conus medullaris and cauda equina: Conus extends to the L1 level. Conus and cauda equina appear normal.  Paraspinal and other soft tissues: Atrophic changes of the kidneys. Right iliac fossa transplant kidney.  Disc levels:  Normal at L2-3 and above.  L3-4: Mild bulging of the disc. Mild facet hypertrophy. No stenosis.  L4-5: Bilateral facet arthropathy with edema and joint effusions. 4 mm of anterolisthesis that could worsen with standing or flexion. Narrowing of both lateral recesses right more than left. Right L5 nerve root compression could occur in this location. Foraminal compromise on the right due to osteophyte and a foraminal to extraforaminal disc fragment likely to compress the right L4 nerve.  L5-S1: Mild bulging of the disc. Mild facet degeneration. No stenosis.  IMPRESSION: The significant findings are likely at L4-5 where there is advanced chronic bilateral facet arthropathy with joint edema and joint effusions allowing anterolisthesis of 4 mm. The disc bulges generally, and there is stenosis of the lateral  recesses worse on the right, which could compress the right L5 nerve. There is right foraminal to extraforaminal disc herniation with a disc fragment that would seem likely to focally compress the right L4 nerve.   Electronically Signed   By: Nelson Chimes M.D.   On: 09/28/2017 11:21     Objective:  VS:  HT:    WT:   BMI:     BP:(!) 98/48  HR:82bpm  TEMP:97.7 F (36.5 C)(Oral)  RESP:  Physical Exam  Ortho Exam Imaging: No results found.

## 2018-02-21 ENCOUNTER — Ambulatory Visit (INDEPENDENT_AMBULATORY_CARE_PROVIDER_SITE_OTHER): Payer: Medicare Other | Admitting: Surgery

## 2018-02-21 ENCOUNTER — Encounter (INDEPENDENT_AMBULATORY_CARE_PROVIDER_SITE_OTHER): Payer: Self-pay | Admitting: Surgery

## 2018-02-21 VITALS — BP 126/72 | HR 71 | Temp 98.6°F | Ht 66.5 in | Wt 109.0 lb

## 2018-02-21 DIAGNOSIS — M47816 Spondylosis without myelopathy or radiculopathy, lumbar region: Secondary | ICD-10-CM

## 2018-02-21 DIAGNOSIS — M48061 Spinal stenosis, lumbar region without neurogenic claudication: Secondary | ICD-10-CM

## 2018-02-26 ENCOUNTER — Encounter (INDEPENDENT_AMBULATORY_CARE_PROVIDER_SITE_OTHER): Payer: Self-pay | Admitting: Surgery

## 2018-02-26 NOTE — Progress Notes (Signed)
84 year old white female returns for recheck of ongoing chronic low back pain and right lower extremity radiculopathy.  Patient did have lumbar ESI with Dr. Lucia Gaskins states that she may have had minimal temporary relief for a few hours.  She continues to have severe pain in the low back and right lower extremity.  States that Dr. Lorin Mercy did briefly discussed that surgical intervention would require lumbar fusion.  Patient states that she is interested in entertaining this if this is indeed an option.  Patient states "I want some relief".   Plan I did discuss patient's MRI findings from June 2019.  It seems that the patient's problem level is at L4-5 where she has bilateral facet arthropathy with edema and joint effusions.  4 mm of anterolisthesis that could worsen with standing or flexion.  Narrowing of both lateral recesses right more than left.  Right L5 nerve root compression could occur in this location.  Foraminal compromise on the right due to osteophyte and a foraminal to extraforaminal disc fragment likely to compress the right L4 nerve.  Advised patient and her husband was present that definitive surgical intervention would be L4-5 decompression and fusion at this level.  Discussed the potential risks and comp occasions of the surgery especially with patient's age and osteoporosis/osteopenia.  Patient is not sure when she had her last bone density test and I will go ahead and schedule her for one she will come back to discuss results and potential treatment for her lumbar spine condition.  All questions answered.

## 2018-02-28 ENCOUNTER — Other Ambulatory Visit (INDEPENDENT_AMBULATORY_CARE_PROVIDER_SITE_OTHER): Payer: Self-pay | Admitting: Surgery

## 2018-02-28 DIAGNOSIS — M8588 Other specified disorders of bone density and structure, other site: Secondary | ICD-10-CM

## 2018-03-01 ENCOUNTER — Other Ambulatory Visit (INDEPENDENT_AMBULATORY_CARE_PROVIDER_SITE_OTHER): Payer: Self-pay | Admitting: Orthopaedic Surgery

## 2018-03-01 DIAGNOSIS — M8588 Other specified disorders of bone density and structure, other site: Secondary | ICD-10-CM

## 2018-03-04 ENCOUNTER — Other Ambulatory Visit: Payer: Medicare Other

## 2018-03-04 ENCOUNTER — Ambulatory Visit
Admission: RE | Admit: 2018-03-04 | Discharge: 2018-03-04 | Disposition: A | Discharge status: Home / Self Care | Payer: Medicare Other | Source: Ambulatory Visit | Attending: Orthopaedic Surgery | Admitting: Orthopaedic Surgery

## 2018-03-04 DIAGNOSIS — M8588 Other specified disorders of bone density and structure, other site: Secondary | ICD-10-CM

## 2018-03-06 MED FILL — SERTRALINE HCL 100 MG TAB: 100 | 30 days supply | Qty: 30 | Fill #0

## 2018-03-06 MED FILL — AMOX-CLAV 500-125 MG TABLET: 500-125 | 7 days supply | Qty: 14 | Fill #0

## 2018-03-07 MED FILL — MEMANTINE HCL 10 MG TABLET: 10 | 30 days supply | Qty: 60 | Fill #9

## 2018-03-08 MED FILL — TACROLIMUS 1 MG CAPSULE: 1 | 30 days supply | Qty: 60 | Fill #2

## 2018-03-08 MED FILL — TACROLIMUS 0.5 MG CAPSULE: 0.5 | 30 days supply | Qty: 60 | Fill #2

## 2018-03-20 MED FILL — MIRTAZAPINE 15 MG TABLET: 15 | 30 days supply | Qty: 30 | Fill #0

## 2018-03-26 ENCOUNTER — Ambulatory Visit (INDEPENDENT_AMBULATORY_CARE_PROVIDER_SITE_OTHER): Payer: Medicare Other | Admitting: Orthopaedic Surgery

## 2018-03-26 ENCOUNTER — Encounter (INDEPENDENT_AMBULATORY_CARE_PROVIDER_SITE_OTHER): Payer: Self-pay | Admitting: Orthopaedic Surgery

## 2018-03-26 VITALS — BP 110/65 | HR 80 | Wt 118.0 lb

## 2018-03-26 DIAGNOSIS — M48061 Spinal stenosis, lumbar region without neurogenic claudication: Secondary | ICD-10-CM

## 2018-03-27 ENCOUNTER — Encounter (INDEPENDENT_AMBULATORY_CARE_PROVIDER_SITE_OTHER): Payer: Self-pay | Admitting: Orthopaedic Surgery

## 2018-03-27 DIAGNOSIS — M48061 Spinal stenosis, lumbar region without neurogenic claudication: Secondary | ICD-10-CM | POA: Insufficient documentation

## 2018-03-27 NOTE — Progress Notes (Signed)
Office Visit Note   Patient: [name]           Date of Birth: December 25, 1931           MRN: [id number] Visit Date: 03/26/2018              Requested by: Deland Pretty, MD [address] [place], [place] [address] PCP: Deland Pretty, MD   Assessment & Plan: Visit Diagnoses:  1. Lumbar foraminal stenosis     Plan: Patient has single level facet arthropathy at L4-5 4 mm anterolisthesis that shifted to 6.4 mm with flexion extension.  Bilateral lateral recess stenosis with foraminal stenosis worse on the right than left.  Surgical treatment discussed which would be an L4-5 single level instrumented fusion with Gill procedure.  CPT [id number], [id number], [id number], [id number], 20936.  PA operative assist will be medically necessary.  Risks of surgery discussed including pseudoarthrosis, dural tear, reoperation, adjacent level disc degeneration, screw malposition, hematoma nerve root involvement and risk of anesthetic complications.  She would require preoperative medical clearance.  Questions elicited and answered they understand request to proceed.  Follow-Up Instructions: No follow-ups on file.   Orders:  No orders of the defined types were placed in this encounter.  No orders of the defined types were placed in this encounter.     Procedures: No procedures performed   Clinical Data: No additional findings.   Subjective: Chief Complaint  Patient presents with  . Lower Back - Pain    HPI 84 year old female seen with chronic low back pain.  She has been through such treatment, physical therapy, Tylenol, anti-inflammatories, epidural steroid injections without relief.  Patient has visit advanced facet arthropathy at L4-5 with anterolisthesis 4 mm shift to 6.4 mm on flexion extension.  Lateral recess stenosis and foraminal stenosis worse on the right than left.  No significant compression at other levels.  She is continued to have pain she is here with her husband and they are  here to discuss operative intervention due to her ongoing chronic pain which bothers her on a daily basis.  She has pain with standing after 45 minutes.  She can walk 2 blocks and then has to stop and sit.  She gets relief with supine position and spends breaks multiple times a day having to stop lay down again on a heating pad for 20 to 30 minutes before she can resume activities.  Review of Systems 14 previous system positive for renal transplant working well.  History of atypical mycobacterial infection.  ORIF patella Dr. Alvan Dame 2015.  Small vessel disease noted on head CT.  No hip or groin pain.  Right mastectomy 1998 for breast cancer.  Hypothyroidism.  WPW, lumbar L4-5 stenosis with claudication.  Recent bone density showed osteopenia.  Otherwise negative is pertains to HPI.   Objective: Vital Signs: BP 110/65   Pulse 80   Wt 118 lb (53.5 kg)   BMI 18.76 kg/m   Physical Exam  Constitutional: She is oriented to person, place, and time. She appears well-developed.  HENT:  Head: Normocephalic.  Right Ear: External ear normal.  Left Ear: External ear normal.  Eyes: Pupils are equal, round, and reactive to light.  Neck: No tracheal deviation present. No thyromegaly present.  Cardiovascular: Normal rate.  Pulmonary/Chest: Effort normal.  Abdominal: Soft.  Neurological: She is alert and oriented to person, place, and time.  Skin: Skin is warm and dry.  Psychiatric: She has a normal mood and affect. Her behavior  is normal.    Ortho Exam patient has some sciatic notch tenderness more on the right than left.  Anterior tib gastrocsoleus is strong.  She is able to heel and toe walk.  Negative logroll to the hips knees reach full extension.  Healed incision from ORIF right patella.  Mild crepitus with knee extension.  Full knee flexion and extension.  No knee effusions.  Distal pulses are palpable.  No rash over exposed skin no lower extremity edema.  Specialty Comments:  No specialty comments  available.  Imaging: CLINICAL DATA:  Low back pain radiating to the right buttock and down the right leg, 2 months duration. Fell 2 months ago.  EXAM: MRI LUMBAR SPINE WITHOUT CONTRAST  TECHNIQUE: Multiplanar, multisequence MR imaging of the lumbar spine was performed. No intravenous contrast was administered.  COMPARISON:  Radiography 09/25/2017  FINDINGS: Segmentation:  5 lumbar type vertebral bodies.  Alignment:  4 mm anterolisthesis L4-5.  Vertebrae:  No fracture or primary bone finding.  Conus medullaris and cauda equina: Conus extends to the L1 level. Conus and cauda equina appear normal.  Paraspinal and other soft tissues: Atrophic changes of the kidneys. Right iliac fossa transplant kidney.  Disc levels:  Normal at L2-3 and above.  L3-4: Mild bulging of the disc. Mild facet hypertrophy. No stenosis.  L4-5: Bilateral facet arthropathy with edema and joint effusions. 4 mm of anterolisthesis that could worsen with standing or flexion. Narrowing of both lateral recesses right more than left. Right L5 nerve root compression could occur in this location. Foraminal compromise on the right due to osteophyte and a foraminal to extraforaminal disc fragment likely to compress the right L4 nerve.  L5-S1: Mild bulging of the disc. Mild facet degeneration. No stenosis.  IMPRESSION: The significant findings are likely at L4-5 where there is advanced chronic bilateral facet arthropathy with joint edema and joint effusions allowing anterolisthesis of 4 mm. The disc bulges generally, and there is stenosis of the lateral recesses worse on the right, which could compress the right L5 nerve. There is right foraminal to extraforaminal disc herniation with a disc fragment that would seem likely to focally compress the right L4 nerve.   Electronically Signed   By: Nelson Chimes M.D.   On: 09/28/2017 11:21   PMFS History: Patient Active Problem List    Diagnosis Date Noted  . Facet arthropathy, lumbar 12/19/2017  . Stenosis of lateral recess of lumbar spine 12/19/2017  . Syncope 10/03/2017  . History of breast cancer 10/03/2017  . Back pain 10/03/2017  . WPW (Wolff-Parkinson-White syndrome) 10/03/2017  . Polyneuropathy associated with underlying disease ([place]) 06/06/2017  . Mild cognitive impairment 04/23/2017  . Dizziness 04/23/2017  . Expected blood loss anemia 07/23/2013  . S/P Right patella ORIF 07/22/2013  . Renal transplant disorder 06/07/2012  . Unspecified hypothyroidism 06/07/2012  . Infection, atypical mycobacterium 06/07/2012   Past Medical History:  Diagnosis Date  . Arthritis   . Cancer ([place]) 1988   BREAST CANCER- MASTECTOMY RIGHT - NO CHEMO NO RADIATION  . CMV (cytomegalovirus infection) ([place])   . Depression with anxiety   . Dizziness   . Gout   . History of breast cancer   . Hypertension   . Hypothyroidism   . Iron deficiency anemia   . Memory loss   . Osteoarthritis   . Osteopenia   . Patella fracture   . Peripheral neuropathy   . Raynauds phenomenon   . Renal disorder    HX OF KIDNEY TRANSPLANT -  [facility] KIDNEY FUNCTION OK -DR. SANDFORD / DR. FOX -   [facility]  . Sinus problem    HX OF SINUS INFECTIONS   . Thyroid disease   . Vitamin D deficiency   . Wolff-Parkinson-White (WPW) syndrome     Family History  Problem Relation Age of Onset  . Other Mother        unsure of history   . Dementia Father   . Dementia Sister   . Cancer Brother        lung    Past Surgical History:  Procedure Laterality Date  . ABDOMINAL HYSTERECTOMY    . BREAST SURGERY     RIGHT MASTECTOMY AND AXILLARY NODE DISSECTION  . JOINT REPLACEMENT     LEFT TOTAL KNEE REPLACEMENT   . KIDNEY TRANSPLANT  march 2012   right side - surgery at [place] [facility]  . ORIF PATELLA Right 07/22/2013   Procedure: OPEN REDUCTION INTERNAL (ORIF) FIXATION PATELLA;  Surgeon: Mauri Pole, MD;  Location: [facility];  Service:  Orthopedics;  Laterality: Right;  . SINUS SURGERY WITH INSTATRAK     Social History   Occupational History  . Occupation: Retired  Tobacco Use  . Smoking status: Never Smoker  . Smokeless tobacco: Never Used  Substance and Sexual Activity  . Alcohol use: Yes    Comment: MAYBE ONE GLASS OF WINE EVERY FEW MONTHS  . Drug use: No  . Sexual activity: Not on file

## 2018-04-04 MED FILL — TACROLIMUS 0.5 MG CAPSULE: 0.5 | 30 days supply | Qty: 60 | Fill #3

## 2018-04-04 MED FILL — MEMANTINE HCL 10 MG TABS: 10 | 30 days supply | Qty: 60 | Fill #10

## 2018-04-04 MED FILL — SERTRALINE HCL 100 MG TAB: 100 | 90 days supply | Qty: 90 | Fill #0

## 2018-04-04 MED FILL — TACROLIMUS 1 MG CAPSULE: 1 | 30 days supply | Qty: 60 | Fill #3

## 2018-04-12 DIAGNOSIS — F330 Major depressive disorder, recurrent, mild: Secondary | ICD-10-CM | POA: Insufficient documentation

## 2018-04-12 DIAGNOSIS — F0280 Dementia in other diseases classified elsewhere without behavioral disturbance: Secondary | ICD-10-CM | POA: Diagnosis present

## 2018-04-12 DIAGNOSIS — E7800 Pure hypercholesterolemia, unspecified: Secondary | ICD-10-CM | POA: Insufficient documentation

## 2018-04-12 MED FILL — oxyCODONE HCL 5 MG TABS: 5 | 1 days supply | Qty: 3 | Fill #0

## 2018-04-18 ENCOUNTER — Telehealth (INDEPENDENT_AMBULATORY_CARE_PROVIDER_SITE_OTHER): Payer: Self-pay | Admitting: Orthopaedic Surgery

## 2018-04-18 NOTE — Telephone Encounter (Signed)
Please see below.

## 2018-04-18 NOTE — Telephone Encounter (Signed)
Patient's husband called stating that they have had a change in circumstances and would like to talk to you about it.  CB#[phone].  Thank you.

## 2018-04-18 NOTE — Telephone Encounter (Signed)
Patient had a fainting spell she has been worked up by cardiology for a month with cardiac monitoring which was negative.  She seen a neurologist this work-up was also negative.  She had a fall has a pelvic fracture in 2 places and likely has rami fracture from what her husband is describing.  Currently she is getting assistance to get up and transfer as well as ambulate.  Once her pelvic fracture heals they can get back in contact with [place] about rescheduling her lumbar procedure.

## 2018-05-13 MED FILL — LEVOTHYROXINE 50 MCG TABLET: 50 | 90 days supply | Qty: 90 | Fill #0

## 2018-05-14 MED FILL — predniSONE 10 MG TABS: 10 | 6 days supply | Qty: 21 | Fill #0

## 2018-05-14 MED FILL — TACROLIMUS ANHYDROUS 0.5MG: 0.5 | 30 days supply | Qty: 60 | Fill #4

## 2018-05-14 MED FILL — MEMANTINE HCL 10 MG TABS: 10 | 30 days supply | Qty: 60 | Fill #11

## 2018-05-14 MED FILL — TACROLIMUS 1 MG CAPSULE: 1 | 30 days supply | Qty: 60 | Fill #4

## 2018-05-21 MED FILL — BROMPHENIR-PSEUDOEPHED-DM S: 30-2-10 | 13 days supply | Qty: 200 | Fill #0

## 2018-05-27 MED FILL — MIRTAZAPINE 15 MG TABLET: 15 | 90 days supply | Qty: 90 | Fill #0

## 2018-05-29 MED FILL — AMOX-CLAV 500-125 MG TABLET: 500-125 | 7 days supply | Qty: 14 | Fill #0

## 2018-06-04 ENCOUNTER — Telehealth (INDEPENDENT_AMBULATORY_CARE_PROVIDER_SITE_OTHER): Payer: Self-pay | Admitting: Radiology

## 2018-06-04 NOTE — Telephone Encounter (Signed)
Patient's husband dropped off envelope containing notes and CD from patient's admission to hospital. [name] was given to me by [facility] on Monday, Feb. 10 and Dr. Lorin Mercy was out of the office for the week. Paperwork passed along to Dr. Lorin Mercy to review. Patient has follow up appointment in the office on 06/11/2018.

## 2018-06-10 ENCOUNTER — Ambulatory Visit: Payer: Medicare Other | Admitting: Neurology

## 2018-06-10 ENCOUNTER — Encounter: Payer: Self-pay | Admitting: Neurology

## 2018-06-10 VITALS — BP 92/58 | HR 61 | Ht 66.5 in | Wt 99.5 lb

## 2018-06-10 DIAGNOSIS — R413 Other amnesia: Secondary | ICD-10-CM | POA: Insufficient documentation

## 2018-06-10 DIAGNOSIS — R269 Unspecified abnormalities of gait and mobility: Secondary | ICD-10-CM | POA: Diagnosis not present

## 2018-06-10 MED ORDER — SERTRALINE HCL 50 MG PO TABS: 100.0000 mg | ORAL_TABLET | Freq: Every day | ORAL | 11 refills | Status: DC

## 2018-06-10 MED FILL — predniSONE 10 MG TABS: 10 | 6 days supply | Qty: 21 | Fill #0

## 2018-06-10 NOTE — Progress Notes (Signed)
HISTORY OF PRESENT ILLNESS: [name] is a 85 year old female, seen in refer by her primary care doctor Deland Pretty, for evaluation of memory loss, dizziness, initial evaluation was on April 23, 2017.  I reviewed and summarized the referring note, she has past medical history of hypothyroidism, on supplement, depression anxiety, breast cancer, hyperlipidemia, hypertension, osteopenia, kidney transplant secondary to chronic kidney disease in 2012, iron deficiency anemia,  Patient is alone at today's clinical visit, was noted to have a hesitation about providing detailed history, she is a retired Designer, multimedia at age 89, currently lives at [facility] assisted living with her husband, she drove to office today,  She reported significant family history of memory loss, father died of dementia, her elderly sister at age 65 also suffered significant memory loss, she noted gradual onset memory loss since 2018, she has word finding difficulties, today's Mini-Mental status examination 26/30, she missed 3 out of 3 recalls.  She also complains of dizziness for a few years, she has transient lightheadedness with sudden positional change, such as quick body movement, getting up quickly from sitting down position.  UPDATE December 04 2017: She is accompanied by her husband at today's clinical visit, who is a retired Technical brewer  She had few episode of passing out, initial episode was in April 2019, she was standing at the door frame, without warning signs, she fell to the ground, husband heard a loud bang, when he attended her, there was no seizure-like activity noticed,  The second episode was few weeks later on July 20, 2017, she fell to the bedside when she is trying to get out of the bed, no loss of consciousness, but it was difficult to get up, paramedic was called, she was taken to the emergency room,  Laboratory evaluation showed creatinine of 1.16, glucose was mildly  elevated 157, normal TSH, A1c 5.4, normal copper, ferritin level, negative troponin  CT head showed generalized atrophy, small vessel disease,  She was seen by cardiologist, echocardiogram on October 04, 2017 showed ejection fraction 65 to 70%,  EMG nerve conduction study in February 2019 showed mild axonal length dependent sensorimotor polyneuropathy  EEG was normal on Sep 11, 2017   She had 30 days cardiac monitoring, report and cardiology follow-up is pending  Her dizziness overall has much improved, she had about 20 pound weight loss over the past 2 years, husband reported she is not drinking enough fluid, patient has been put on drive limitations since her passing out spell in April 2019, continue worsening memory loss, Mini-Mental Status Examination 26 out of 30, animal naming 3, her father and elderly sister also suffered severe dementia  She also has depression,  UPDATE Jun 10 2018: She still has recurrent spells of passing out, it all happened in a standing position, on Apr 09 2018, she fell, broke her pelvis, prolonged stay at hospital and rehabilitation, had a lots of pain, now walk with a walker.  Also complains of left-sided low back pain, poor appetite,   REVIEW OF SYSTEMS: Out of a complete 14 system review of symptoms, the patient complains only of the following symptoms, and all other reviewed systems are negative.  Back pain ALLERGIES: Allergies  Allergen Reactions  . Cephalexin Hives  . Augmentin [Amoxicillin-Pot Clavulanate] Nausea And Vomiting  . Doxycycline Nausea And Vomiting  . Minocycline Hives and Other (See Comments)    Gave bladder infection     HOME MEDICATIONS: Outpatient Medications Prior to Visit  Medication Sig  Dispense Refill  . acetaminophen (TYLENOL) 325 MG tablet Take 325 mg by mouth as needed (pain).     Marland Kitchen alendronate (FOSAMAX) 70 MG tablet Take 70 mg by mouth every 7 (seven) days.    Marland Kitchen amitriptyline (ELAVIL) 25 MG tablet Take 25 mg by mouth  daily.     . Calcium Carbonate (CALCIUM 600 PO) Take 1 tablet by mouth 2 (two) times daily.    . cholecalciferol (VITAMIN D) 1000 UNITS tablet Take 2,000 Units by mouth daily.     Marland Kitchen denosumab (PROLIA) 60 MG/ML SOLN injection Inject 60 mg into the skin every 6 (six) months. Administer in upper arm, thigh, or abdomen    . levothyroxine (SYNTHROID, LEVOTHROID) 50 MCG tablet Take 50 mcg by mouth daily.    . meclizine (ANTIVERT) 25 MG tablet Take 25 mg by mouth daily.     . memantine (NAMENDA) 10 MG tablet Take 1 tablet (10 mg total) by mouth 2 (two) times daily. 60 tablet 11  . mirtazapine (REMERON) 7.5 MG tablet   12  . predniSONE (DELTASONE) 5 MG tablet Take 10 mg by mouth daily.     . sertraline (ZOLOFT) 50 MG tablet   4  . sulfamethoxazole-trimethoprim (BACTRIM,SEPTRA) 400-80 MG per tablet Take 1 tablet by mouth daily. 30 tablet 11  . tacrolimus (PROGRAF) 1 MG capsule Take 2 mg by mouth 2 (two) times daily.     Marland Kitchen tiZANidine (ZANAFLEX) 4 MG tablet Take 4 mg by mouth as needed for muscle spasms.  0   Facility-Administered Medications Prior to Visit  Medication Dose Route Frequency Provider Last Rate Last Dose  . betamethasone acetate-betamethasone sodium phosphate (CELESTONE) injection 12 mg  12 mg Other Once Magnus Sinning, MD        PAST MEDICAL HISTORY: Past Medical History:  Diagnosis Date  . Arthritis   . Cancer ([place]) 1988   BREAST CANCER- MASTECTOMY RIGHT - NO CHEMO NO RADIATION  . CMV (cytomegalovirus infection) ([place])   . Depression with anxiety   . Dizziness   . Gout   . History of breast cancer   . Hypertension   . Hypothyroidism   . Iron deficiency anemia   . Memory loss   . Osteoarthritis   . Osteopenia   . Patella fracture   . Peripheral neuropathy   . Raynauds phenomenon   . Renal disorder    HX OF KIDNEY TRANSPLANT - STATES KIDNEY FUNCTION OK -DR. SANDFORD / DR. FOX -   [facility]  . Sinus problem    HX OF SINUS INFECTIONS   . Thyroid disease   .  Vitamin D deficiency   . Wolff-Parkinson-White (WPW) syndrome     PAST SURGICAL HISTORY: Past Surgical History:  Procedure Laterality Date  . ABDOMINAL HYSTERECTOMY    . BREAST SURGERY     RIGHT MASTECTOMY AND AXILLARY NODE DISSECTION  . JOINT REPLACEMENT     LEFT TOTAL KNEE REPLACEMENT   . KIDNEY TRANSPLANT  march 2012   right side - surgery at [place]  . ORIF PATELLA Right 07/22/2013   Procedure: OPEN REDUCTION INTERNAL (ORIF) FIXATION PATELLA;  Surgeon: Mauri Pole, MD;  Location: [facility];  Service: Orthopedics;  Laterality: Right;  . SINUS SURGERY WITH INSTATRAK      FAMILY HISTORY: Family History  Problem Relation Age of Onset  . Other Mother        unsure of history   . Dementia Father   . Dementia Sister   .  Cancer Brother        lung    SOCIAL HISTORY: Social History   Socioeconomic History  . Marital status: Married    Spouse name: Not on file  . Number of children: 2  . Years of education: college  . Highest education level: Bachelor's degree (e.g., BA, AB, BS)  Occupational History  . Occupation: Retired  Scientific laboratory technician  . Financial resource strain: Not on file  . Food insecurity:    Worry: Not on file    Inability: Not on file  . Transportation needs:    Medical: Not on file    Non-medical: Not on file  Tobacco Use  . Smoking status: Never Smoker  . Smokeless tobacco: Never Used  Substance and Sexual Activity  . Alcohol use: Yes    Comment: MAYBE ONE GLASS OF WINE EVERY FEW MONTHS  . Drug use: No  . Sexual activity: Not on file  Lifestyle  . Physical activity:    Days per week: Not on file    Minutes per session: Not on file  . Stress: Not on file  Relationships  . Social connections:    Talks on phone: Not on file    Gets together: Not on file    Attends religious service: Not on file    Active member of club or organization: Not on file    Attends meetings of clubs or organizations: Not on file    Relationship status: Not on  file  . Intimate partner violence:    Fear of current or ex partner: Not on file    Emotionally abused: Not on file    Physically abused: Not on file    Forced sexual activity: Not on file  Other Topics Concern  . Not on file  Social History Narrative   Lives at home with [facility] at [facility].   Right-handed.   Occasional caffeine use.      PHYSICAL EXAM  Vitals:   12/04/17 1039  BP: 117/69  Pulse: 76  Weight: 108 lb (49 kg)  Height: 5' 6.5" (1.689 m)   Body mass index is 17.17 kg/m.   MMSE - Mini Mental State Exam 06/10/2018 12/04/2017 07/26/2017  Orientation to time 3 4 5   Orientation to Place 4 5 5   Registration 3 3 3   Attention/ Calculation 5 5 5   Recall 2 0 1  Language- name 2 objects 2 2 2   Language- repeat 1 1 1   Language- follow 3 step command 3 3 3   Language- read & follow direction 1 1 1   Write a sentence 1 1 1   Copy design 1 1 1   Total score 26 26 28   animal naming 13  Generalized: Well developed, in no acute distress   Neurological examination  Mentation: Alert oriented to time, place, history taking. Follows all commands speech and language fluent Cranial nerve II-XII: Pupils were equal round reactive to light. Extraocular movements were full, visual field were full on confrontational test. Facial sensation and strength were normal. Uvula tongue midline. Head turning and shoulder shrug  were normal and symmetric. Motor: Normal bulk strength, Sensory: Mild length dependent decreased vibratory sensation pinprick to above ankle level.  Coordination: Cerebellar testing reveals good finger-nose-finger and heel-to-shin bilaterally.  Gait and station: Gait is normal. Tandem gait slightly unsteady.  Romberg is negative. No drift is seen.  Reflexes: Deep tendon reflexes are hypoactive and symmetric bilaterally.   DIAGNOSTIC DATA (LABS, IMAGING, TESTING) - I reviewed patient records, labs, notes,  testing and imaging myself where available.  Lab Results    Component Value Date   WBC 9.5 08/19/2017   HGB 15.1 (H) 08/19/2017   HCT 42.3 08/19/2017   MCV 91.4 08/19/2017   PLT 173 08/19/2017      Component Value Date/Time   NA 134 (L) 08/19/2017 1530   NA 138 04/23/2017 1050   K 4.0 08/19/2017 1530   CL 100 (L) 08/19/2017 1530   CO2 24 08/19/2017 1530   GLUCOSE 157 (H) 08/19/2017 1530   BUN 28 (H) 08/19/2017 1530   BUN 26 04/23/2017 1050   CREATININE 1.16 (H) 08/19/2017 1530   CALCIUM 9.1 08/19/2017 1530   PROT 7.0 06/06/2017 0957   ALBUMIN 4.5 04/23/2017 1050   AST 23 04/23/2017 1050   ALT 16 04/23/2017 1050   ALKPHOS 87 04/23/2017 1050   BILITOT 0.3 04/23/2017 1050   GFRNONAA 42 (L) 08/19/2017 1530   GFRAA 49 (L) 08/19/2017 1530    Lab Results  Component Value Date   HGBA1C 5.4 06/06/2017   Lab Results  Component Value Date   VITAMINB12 613 04/23/2017   Lab Results  Component Value Date   TSH 0.909 06/06/2017    ASSESSMENT AND PLAN 85 y.o. year old female   Mild cognitive impairment  Mini-Mental Status Examination 26 out of 38, animal naming of 13  Significant family history of dementia  MRI of the brain showed significant atrophy, supratentorium small vessel disease  Consistent with central nervous system degenerative disorder, also complicated by her depression  Continue Namenda 10 mg twice a day,  Not a good candidate for Aricept due to passing out spells, likely due to orthostatic blood pressure changes,    Depression  Zoloft 50 mg 2 tablets every day      Marcial Pacas, M.D. Ph.D.  [facility] [place], [place] [address] Phone: [phone] Fax:      [phone]

## 2018-06-11 ENCOUNTER — Encounter (INDEPENDENT_AMBULATORY_CARE_PROVIDER_SITE_OTHER): Payer: Self-pay | Admitting: Orthopaedic Surgery

## 2018-06-11 ENCOUNTER — Ambulatory Visit (INDEPENDENT_AMBULATORY_CARE_PROVIDER_SITE_OTHER): Payer: Medicare Other

## 2018-06-11 ENCOUNTER — Ambulatory Visit (INDEPENDENT_AMBULATORY_CARE_PROVIDER_SITE_OTHER): Payer: Medicare Other | Admitting: Orthopaedic Surgery

## 2018-06-11 VITALS — BP 107/61 | HR 88 | Ht 66.5 in | Wt 100.0 lb

## 2018-06-11 DIAGNOSIS — M25552 Pain in left hip: Secondary | ICD-10-CM | POA: Diagnosis not present

## 2018-06-11 DIAGNOSIS — S32810D Multiple fractures of pelvis with stable disruption of pelvic ring, subsequent encounter for fracture with routine healing: Secondary | ICD-10-CM | POA: Diagnosis not present

## 2018-06-11 DIAGNOSIS — S32810A Multiple fractures of pelvis with stable disruption of pelvic ring, initial encounter for closed fracture: Secondary | ICD-10-CM | POA: Insufficient documentation

## 2018-06-11 NOTE — Progress Notes (Signed)
Office Visit Note   Patient: [name]           Date of Birth: 1931-10-03           MRN: [id number] Visit Date: 06/11/2018              Requested by: Deland Pretty, MD [address] [place], [place] [address] PCP: Deland Pretty, MD   Assessment & Plan: Visit Diagnoses:  1. Pain in left hip   2. Closed pelvic ring fracture with routine healing, subsequent encounter     Plan: Continue walker ambulation.  She is lost weight from 1 week to 110 range down to only 100 pounds and needs to increase p.o. intake.  She can discontinue the Percocet.  She was using it sparingly but we discussed appetite suppression with narcotics.  She can use and plain Tylenol continue with ambulation.  Increase protein intake and return in 1 month for repeat AP pelvis x-ray.  Follow-Up Instructions: Return in about 1 month (around 07/10/2018).   Orders:  Orders Placed This Encounter  Procedures  . XR Pelvis 1-2 Views   No orders of the defined types were placed in this encounter.     Procedures: No procedures performed   Clinical Data: No additional findings.   Subjective: Chief Complaint  Patient presents with  . Pelvis - Fracture    DOI 04/09/18  . Lower Back - Pain    HPI 85 year old female returns she had lumbar instability and we previously discussed lumbar fusion.  [place] in [place] with the left hemipelvis fracture comminuted iliac wing as well as left superior inferior rami fracture.  Review of Systems updated unchanged from previous office visit.   Objective: Vital Signs: BP 107/61   Pulse 88   Ht 5' 6.5" (1.689 m)   Wt 100 lb (45.4 kg)   BMI 15.90 kg/m   Physical Exam Constitutional:      Appearance: She is well-developed.  HENT:     Head: Normocephalic.     Right Ear: External ear normal.     Left Ear: External ear normal.  Eyes:     Pupils: Pupils are equal, round, and reactive to light.    Neck:     Thyroid: No thyromegaly.     Trachea: No tracheal deviation.  Cardiovascular:     Rate and Rhythm: Normal rate.  Pulmonary:     Effort: Pulmonary effort is normal.  Abdominal:     Palpations: Abdomen is soft.  Skin:    General: Skin is warm and dry.  Neurological:     Mental Status: She is alert and oriented to person, place, and time.  Psychiatric:        Behavior: Behavior normal.     Ortho Exam patient is amatory with a walker she can do straight leg raise.  Mild tenderness over the left iliac wing of the pelvis.  Dermatome sensation is intact.  Specialty Comments:  No specialty comments available.  Imaging: Xr Pelvis 1-2 Views  Result Date: 06/11/2018 AP pelvis and iliac and obturator oblique x-rays are obtained and reviewed.  This shows a left iliac wing fracture that is healing.  Left superior and inferior rami fracture with displacement of superior rami and interval healing of the inferior rami fracture.  Comparison to 04/09/2018 CT scan disc images demonstrate interval healing of all fractures except for the displaced superior rami fracture on the left. Impression:  Left sided pelvic ring fracture as described above    PMFS History: Patient Active Problem List   Diagnosis Date Noted  . Memory loss 06/10/2018  . Gait abnormality 06/10/2018  . Lumbar foraminal stenosis 03/27/2018  . Facet arthropathy, lumbar 12/19/2017  . Stenosis of lateral recess of lumbar spine 12/19/2017  . Syncope 10/03/2017  . History of breast cancer 10/03/2017  . Back pain 10/03/2017  . WPW (Wolff-Parkinson-White syndrome) 10/03/2017  . Polyneuropathy associated with underlying disease ([place]) 06/06/2017  . Mild cognitive impairment 04/23/2017  . Dizziness 04/23/2017  . Expected blood loss anemia 07/23/2013  . S/P Right patella ORIF 07/22/2013  . Renal transplant disorder 06/07/2012  . Unspecified hypothyroidism 06/07/2012  . Infection, atypical mycobacterium 06/07/2012   Past  Medical History:  Diagnosis Date  . Arthritis   . Cancer ([place]) 1988   BREAST CANCER- MASTECTOMY RIGHT - NO CHEMO NO RADIATION  . CMV (cytomegalovirus infection) ([place])   . Depression with anxiety   . Dizziness   . Gout   . History of breast cancer   . Hypertension   . Hypothyroidism   . Iron deficiency anemia   . Memory loss   . Osteoarthritis   . Osteopenia   . Patella fracture   . Peripheral neuropathy   . Raynauds phenomenon   . Renal disorder    HX OF KIDNEY TRANSPLANT - STATES KIDNEY FUNCTION OK -DR. SANDFORD / DR. FOX -   [facility]  . Sinus problem    HX OF SINUS INFECTIONS   . Thyroid disease   . Vitamin D deficiency   . Wolff-Parkinson-White (WPW) syndrome     Family History  Problem Relation Age of Onset  . Other Mother        unsure of history   . Dementia Father   . Dementia Sister   . Cancer Brother        lung    Past Surgical History:  Procedure Laterality Date  . ABDOMINAL HYSTERECTOMY    . BREAST SURGERY     RIGHT MASTECTOMY AND AXILLARY NODE DISSECTION  . JOINT REPLACEMENT     LEFT TOTAL KNEE REPLACEMENT   . KIDNEY TRANSPLANT  march 2012   right side - surgery at [facility]  . ORIF PATELLA Right 07/22/2013   Procedure: OPEN REDUCTION INTERNAL (ORIF) FIXATION PATELLA;  Surgeon: Mauri Pole, MD;  Location: [facility];  Service: Orthopedics;  Laterality: Right;  . SINUS SURGERY WITH INSTATRAK     Social History   Occupational History  . Occupation: Retired  Tobacco Use  . Smoking status: Never Smoker  . Smokeless tobacco: Never Used  Substance and Sexual Activity  . Alcohol use: Yes    Comment: MAYBE ONE GLASS OF WINE EVERY FEW MONTHS  . Drug use: No  . Sexual activity: Not on file

## 2018-06-20 ENCOUNTER — Other Ambulatory Visit: Payer: Self-pay | Admitting: Neurology

## 2018-06-20 MED FILL — MEMANTINE HCL 10 MG TABS: 10 | 30 days supply | Qty: 60 | Fill #0

## 2018-06-20 MED FILL — TACROLIMUS 1 MG CAPSULE: 1 | 30 days supply | Qty: 60 | Fill #5

## 2018-06-20 MED FILL — TACROLIMUS ANHYDROUS 0.5MG: 0.5 | 30 days supply | Qty: 60 | Fill #5

## 2018-06-26 MED FILL — predniSONE 10 MG TABS: 10 | 90 days supply | Qty: 90 | Fill #0

## 2018-07-08 ENCOUNTER — Telehealth (INDEPENDENT_AMBULATORY_CARE_PROVIDER_SITE_OTHER): Payer: Self-pay

## 2018-07-08 NOTE — Telephone Encounter (Signed)
Called patient. No answer LMOM to return our call. If they return call, please ask them screening questions below. Thank you.   Do you have now or have you had in the past 7 days a fever and/or chills?   Do you have now or have you had in the past 7 days a cough?   Do you have now or have you had in the last 7 days nausea, vomiting or abdominal pain?   Have you been exposed to anyone who has tested positive for COVID-19?   Have you or anyone who lives with you traveled within the last month?

## 2018-07-09 ENCOUNTER — Ambulatory Visit (INDEPENDENT_AMBULATORY_CARE_PROVIDER_SITE_OTHER): Payer: Self-pay

## 2018-07-09 ENCOUNTER — Encounter (INDEPENDENT_AMBULATORY_CARE_PROVIDER_SITE_OTHER): Payer: Self-pay | Admitting: Orthopaedic Surgery

## 2018-07-09 ENCOUNTER — Ambulatory Visit (INDEPENDENT_AMBULATORY_CARE_PROVIDER_SITE_OTHER): Payer: Medicare Other | Admitting: Orthopaedic Surgery

## 2018-07-09 ENCOUNTER — Other Ambulatory Visit: Payer: Self-pay

## 2018-07-09 VITALS — Ht 66.5 in | Wt 103.0 lb

## 2018-07-09 DIAGNOSIS — W19XXXD Unspecified fall, subsequent encounter: Secondary | ICD-10-CM

## 2018-07-09 DIAGNOSIS — S32810D Multiple fractures of pelvis with stable disruption of pelvic ring, subsequent encounter for fracture with routine healing: Secondary | ICD-10-CM | POA: Diagnosis not present

## 2018-07-09 DIAGNOSIS — M48062 Spinal stenosis, lumbar region with neurogenic claudication: Secondary | ICD-10-CM

## 2018-07-09 NOTE — Progress Notes (Signed)
Office Visit Note   Patient: [name]           Date of Birth: 07-Dec-1931           MRN: [id number] Visit Date: 07/09/2018              Requested by: Deland Pretty, MD [address] [place], [place] [address] PCP: Deland Pretty, MD   Assessment & Plan: Visit Diagnoses:  1. Closed pelvic ring fracture with routine healing, subsequent encounter     Plan: Recheck 6 weeks.  Once elective surgery resents we can discuss operative scheduling.  Should continue to watch her nutrition carefully avoid losing weight and continue to walk daily.  Follow-Up Instructions: Return in about 6 weeks (around 08/20/2018).   Orders:  Orders Placed This Encounter  Procedures  . XR Pelvis 1-2 Views   No orders of the defined types were placed in this encounter.     Procedures: No procedures performed   Clinical Data: No additional findings.   Subjective: Chief Complaint  Patient presents with  . Pelvis - Fracture, Follow-up    DOI 04/09/18    HPI 85 year old female returns for follow-up of left iliac wing fracture and inferior and superior rami fractures.  She is walking with a walker still has problems with neurogenic claudication symptoms but states the iliac wing pain and groin pain from the rami fractures has resolved.  She states she is ready to consider lumbar decompression instrumented fusion.  Previous scan had shown L4-5 instability noted on flexion-extension and plan was for single level decompression fusion for central stenosis with instability and severe foraminal stenosis.  Review of Systems you updated unchanged from last office visit.  Previous ORIF right patella.  Lumbar foraminal stenosis central stenosis pelvic fracture renal transplant disorder.  Patient is a resident of [facility].   Objective: Vital Signs: Ht 5' 6.5" (1.689 m)   Wt 103 lb (46.7 kg)   BMI 16.38 kg/m   Physical Exam Constitutional:      Appearance: She is  well-developed.  HENT:     Head: Normocephalic.     Right Ear: External ear normal.     Left Ear: External ear normal.  Eyes:     Pupils: Pupils are equal, round, and reactive to light.  Neck:     Thyroid: No thyromegaly.     Trachea: No tracheal deviation.  Cardiovascular:     Rate and Rhythm: Normal rate.  Pulmonary:     Effort: Pulmonary effort is normal.  Abdominal:     Palpations: Abdomen is soft.  Skin:    General: Skin is warm and dry.  Neurological:     Mental Status: She is alert and oriented to person, place, and time.  Psychiatric:        Behavior: Behavior normal.     Ortho Exam patient ambulatory without a walker with a mild limp.  No pelvic obliquity.Anterior tib gastrocsoleus is strong. Specialty Comments:  No specialty comments available.  Imaging: No results found.   PMFS History: Patient Active Problem List   Diagnosis Date Noted  . Closed pelvic ring fracture ([place]) 06/11/2018  . Memory loss 06/10/2018  . Gait abnormality 06/10/2018  . Lumbar foraminal stenosis 03/27/2018  . Facet arthropathy, lumbar 12/19/2017  . Stenosis of lateral recess of lumbar spine 12/19/2017  . Syncope 10/03/2017  . History of breast cancer 10/03/2017  . Back pain 10/03/2017  . WPW (Wolff-Parkinson-White syndrome) 10/03/2017  . Polyneuropathy associated  with underlying disease ([place]) 06/06/2017  . Mild cognitive impairment 04/23/2017  . Dizziness 04/23/2017  . Expected blood loss anemia 07/23/2013  . S/P Right patella ORIF 07/22/2013  . Renal transplant disorder 06/07/2012  . Unspecified hypothyroidism 06/07/2012  . Infection, atypical mycobacterium 06/07/2012   Past Medical History:  Diagnosis Date  . Arthritis   . Cancer ([place]) 1988   BREAST CANCER- MASTECTOMY RIGHT - NO CHEMO NO RADIATION  . CMV (cytomegalovirus infection) ([place])   . Depression with anxiety   . Dizziness   . Gout   . History of breast cancer   . Hypertension   . Hypothyroidism   . Iron  deficiency anemia   . Memory loss   . Osteoarthritis   . Osteopenia   . Patella fracture   . Peripheral neuropathy   . Raynauds phenomenon   . Renal disorder    HX OF KIDNEY TRANSPLANT - STATES KIDNEY FUNCTION OK -DR. SANDFORD / DR[facility] FOX -    [facility]  . Sinus problem    HX OF SINUS INFECTIONS   . Thyroid disease   . Vitamin D deficiency   . Wolff-Parkinson-White (WPW) syndrome     Family History  Problem Relation Age of Onset  . Other Mother        unsure of history   . Dementia Father   . Dementia Sister   . Cancer Brother        lung    Past Surgical History:  Procedure Laterality Date  . ABDOMINAL HYSTERECTOMY    . BREAST SURGERY     RIGHT MASTECTOMY AND AXILLARY NODE DISSECTION  . JOINT REPLACEMENT     LEFT TOTAL KNEE REPLACEMENT   . KIDNEY TRANSPLANT  march 2012   right side - surgery at [facility]  . ORIF PATELLA Right 07/22/2013   Procedure: OPEN REDUCTION INTERNAL (ORIF) FIXATION PATELLA;  Surgeon: Mauri Pole, MD;  Location: [facility];  Service: Orthopedics;  Laterality: Right;  . SINUS SURGERY WITH INSTATRAK     Social History   Occupational History  . Occupation: Retired  Tobacco Use  . Smoking status: Never Smoker  . Smokeless tobacco: Never Used  Substance and Sexual Activity  . Alcohol use: Yes    Comment: MAYBE ONE GLASS OF WINE EVERY FEW MONTHS  . Drug use: No  . Sexual activity: Not on file

## 2018-07-12 MED FILL — SERTRALINE HCL 100 MG TAB: 100 | 90 days supply | Qty: 90 | Fill #1

## 2018-07-15 ENCOUNTER — Other Ambulatory Visit: Payer: Self-pay | Admitting: Gastroenterology

## 2018-07-15 DIAGNOSIS — D649 Anemia, unspecified: Secondary | ICD-10-CM

## 2018-07-15 DIAGNOSIS — R634 Abnormal weight loss: Secondary | ICD-10-CM

## 2018-07-16 ENCOUNTER — Other Ambulatory Visit: Payer: Self-pay

## 2018-07-16 ENCOUNTER — Ambulatory Visit
Admission: RE | Admit: 2018-07-16 | Discharge: 2018-07-16 | Disposition: A | Discharge status: Home / Self Care | Payer: Medicare Other | Source: Ambulatory Visit | Attending: Gastroenterology | Admitting: Gastroenterology

## 2018-07-16 DIAGNOSIS — D649 Anemia, unspecified: Secondary | ICD-10-CM

## 2018-07-16 DIAGNOSIS — R634 Abnormal weight loss: Secondary | ICD-10-CM

## 2018-07-16 MED ORDER — IOPAMIDOL (ISOVUE-300) INJECTION 61%
100.0000 mL | Freq: Once | INTRAVENOUS | Status: AC | PRN
  Administered 2018-07-16: 100 mL via INTRAVENOUS

## 2018-07-19 MED FILL — TACROLIMUS 1 MG CAPSULE: 1 | 30 days supply | Qty: 60 | Fill #6

## 2018-07-19 MED FILL — TACROLIMUS ANHYDROUS 0.5MG: 0.5 | 30 days supply | Qty: 60 | Fill #6

## 2018-07-19 MED FILL — MEMANTINE HCL 10 MG TABS: 10 | 30 days supply | Qty: 60 | Fill #1

## 2018-08-19 MED FILL — LEVOTHYROXINE 50 MCG TABLET: 50 | 90 days supply | Qty: 90 | Fill #1

## 2018-08-20 ENCOUNTER — Ambulatory Visit (INDEPENDENT_AMBULATORY_CARE_PROVIDER_SITE_OTHER): Payer: Medicare Other | Admitting: Orthopaedic Surgery

## 2018-08-20 ENCOUNTER — Other Ambulatory Visit: Payer: Self-pay

## 2018-08-20 ENCOUNTER — Encounter: Payer: Self-pay | Admitting: Orthopaedic Surgery

## 2018-08-20 ENCOUNTER — Ambulatory Visit: Payer: Medicare Other | Admitting: Orthopaedic Surgery

## 2018-08-20 ENCOUNTER — Ambulatory Visit: Payer: Medicare Other

## 2018-08-20 DIAGNOSIS — M48061 Spinal stenosis, lumbar region without neurogenic claudication: Secondary | ICD-10-CM

## 2018-08-20 DIAGNOSIS — M47816 Spondylosis without myelopathy or radiculopathy, lumbar region: Secondary | ICD-10-CM

## 2018-08-20 DIAGNOSIS — S32810D Multiple fractures of pelvis with stable disruption of pelvic ring, subsequent encounter for fracture with routine healing: Secondary | ICD-10-CM

## 2018-08-20 NOTE — Addendum Note (Signed)
Addended by: Meyer Cory on: 08/20/2018 11:36 AM   Modules accepted: Orders

## 2018-08-20 NOTE — Progress Notes (Signed)
Office Visit Note   Patient: [name]           Date of Birth: 1931/10/25           MRN: [id number] Visit Date: 08/20/2018              Requested by: Deland Pretty, MD [address] [place], [place] [address] PCP: Deland Pretty, MD   Assessment & Plan: Visit Diagnoses:  1. Closed pelvic ring fracture with routine healing, subsequent encounter   2. Facet arthropathy, lumbar   3. Lumbar foraminal stenosis     Plan: We will repeat her facet injection and see if this gives her some sustained relief.  A portion of her pain may be related from the previous pelvic fracture versus the foraminal stenosis anterolisthesis lateral recess stenosis at L4-5 level.  We discussed treatment for a problem would require single level fusion and the medication that she would get for the pain afterwards may make her more confused.  We will see how she does with the injection and I plan to check her back again in 6 weeks.  Patient was here with her husband today and outlined treatment plan was discussed with patient and her husband.  Follow-Up Instructions: Return in about 6 weeks (around 10/01/2018).   Orders:  Orders Placed This Encounter  Procedures  . XR Pelvis 1-2 Views   No orders of the defined types were placed in this encounter.     Procedures: No procedures performed   Clinical Data: No additional findings.   Subjective: Chief Complaint  Patient presents with  . Pelvis - Fracture, Follow-up    DOI 04/09/2018    HPI patient returns with gradual improvement of her pain from pelvic ring fracture.  Still has pain with standing and prolonged ambulation aching pain in the back at the lumbosacral junction as she had before the fall.  She has had facet injections that gave her temporary relief.  MRI scan showed 4 mm anterolisthesis and significant facet arthropathy without pars defects.  Facet injections at L4-5 had given her some sustained relief relief for  several weeks.  She has some mild lateral recess stenosis and some central stenosis.  In the past we discussed surgical options which would be a single level fusion.  No bowel bladder symptoms.  Some days she uses a walker some days a cane today she does not have either.  No falls since last office visit.  Review of Systems 14 point system update unchanged from last office visit.  Ongoing problems with degenerative anterolisthesis L4-5 with stenosis.  No change on flexion-extension x-rays.  Positive for memory loss.  Positive for falls with pelvic fracture healed now.  Otherwise negative.   Objective: Vital Signs: There were no vitals taken for this visit.  Physical Exam Constitutional:      Appearance: She is well-developed.  HENT:     Head: Normocephalic.     Right Ear: External ear normal.     Left Ear: External ear normal.  Eyes:     Pupils: Pupils are equal, round, and reactive to light.  Neck:     Thyroid: No thyromegaly.     Trachea: No tracheal deviation.  Cardiovascular:     Rate and Rhythm: Normal rate.  Pulmonary:     Effort: Pulmonary effort is normal.  Abdominal:     Palpations: Abdomen is soft.  Skin:    General: Skin is warm and dry.  Neurological:  Mental Status: She is alert and oriented to person, place, and time.  Psychiatric:        Behavior: Behavior normal.     Ortho Exam patient has no tenderness over the left initial tuberosity no tenderness with palpation or pressure on the left iliac wing.  She ambulates with a short stride gait without limping.  No trochanteric bursal tenderness negative logroll to the hips.  No rash over exposed skin knee and ankle jerk are intact.  Specialty Comments:  No specialty comments available.  Imaging: Xr Pelvis 1-2 Views  Result Date: 08/20/2018 AP pelvis x-ray obtained.  This shows interval healing of the left superior inferior rami fracture and iliac wing fracture.  This has progressed from February x-rays.  Impression: Pelvic fracture with interval healing.  Negative for new fractures.    PMFS History: Patient Active Problem List   Diagnosis Date Noted  . Closed pelvic ring fracture ([place]) 06/11/2018  . Memory loss 06/10/2018  . Gait abnormality 06/10/2018  . Lumbar foraminal stenosis 03/27/2018  . Facet arthropathy, lumbar 12/19/2017  . Stenosis of lateral recess of lumbar spine 12/19/2017  . Syncope 10/03/2017  . History of breast cancer 10/03/2017  . Back pain 10/03/2017  . WPW (Wolff-Parkinson-White syndrome) 10/03/2017  . Polyneuropathy associated with underlying disease ([place]) 06/06/2017  . Mild cognitive impairment 04/23/2017  . Dizziness 04/23/2017  . Expected blood loss anemia 07/23/2013  . S/P Right patella ORIF 07/22/2013  . Renal transplant disorder 06/07/2012  . Unspecified hypothyroidism 06/07/2012  . Infection, atypical mycobacterium 06/07/2012   Past Medical History:  Diagnosis Date  . Arthritis   . Cancer ([place]) 1988   BREAST CANCER- MASTECTOMY RIGHT - NO CHEMO NO RADIATION  . CMV (cytomegalovirus infection) ([place])   . Depression with anxiety   . Dizziness   . Gout   . History of breast cancer   . Hypertension   . Hypothyroidism   . Iron deficiency anemia   . Memory loss   . Osteoarthritis   . Osteopenia   . Patella fracture   . Peripheral neuropathy   . Raynauds phenomenon   . Renal disorder    HX OF KIDNEY TRANSPLANT - STATES KIDNEY FUNCTION OK -DR. SANDFORD / DR. FOX -   [facility]  . Sinus problem    HX OF SINUS INFECTIONS   . Thyroid disease   . Vitamin D deficiency   . Wolff-Parkinson-White (WPW) syndrome     Family History  Problem Relation Age of Onset  . Other Mother        unsure of history   . Dementia Father   . Dementia Sister   . Cancer Brother        lung    Past Surgical History:  Procedure Laterality Date  . ABDOMINAL HYSTERECTOMY    . BREAST SURGERY     RIGHT MASTECTOMY AND AXILLARY NODE DISSECTION  . JOINT  REPLACEMENT     LEFT TOTAL KNEE REPLACEMENT   . KIDNEY TRANSPLANT  march 2012   right side - surgery at [facility]  . ORIF PATELLA Right 07/22/2013   Procedure: OPEN REDUCTION INTERNAL (ORIF) FIXATION PATELLA;  Surgeon: Mauri Pole, MD;  Location: [facility];  Service: Orthopedics;  Laterality: Right;  . SINUS SURGERY WITH INSTATRAK     Social History   Occupational History  . Occupation: Retired  Tobacco Use  . Smoking status: Never Smoker  . Smokeless tobacco: Never Used  Substance and Sexual Activity  .  Alcohol use: Yes    Comment: MAYBE ONE GLASS OF WINE EVERY FEW MONTHS  . Drug use: No  . Sexual activity: Not on file

## 2018-08-26 MED FILL — MEMANTINE HCL 10 MG TABS: 10 | 30 days supply | Qty: 60 | Fill #2

## 2018-08-26 MED FILL — TACROLIMUS 1 MG CAPSULE: 1 | 30 days supply | Qty: 60 | Fill #7

## 2018-08-26 MED FILL — TACROLIMUS ANHYDROUS 0.5MG: 0.5 | 30 days supply | Qty: 60 | Fill #7

## 2018-09-10 MED FILL — MIRTAZAPINE 15 MG TABLET: 15 | 90 days supply | Qty: 90 | Fill #1

## 2018-09-18 ENCOUNTER — Encounter: Payer: Medicare Other | Admitting: Physical Medicine and Rehabilitation

## 2018-09-25 MED FILL — TACROLIMUS 1 MG CAPSULE: 1 | 30 days supply | Qty: 60 | Fill #8

## 2018-09-25 MED FILL — TACROLIMUS ANHYDROUS 0.5MG: 0.5 | 30 days supply | Qty: 60 | Fill #8

## 2018-09-25 MED FILL — MEMANTINE HCL 10 MG TABS: 10 | 30 days supply | Qty: 60 | Fill #3

## 2018-10-01 ENCOUNTER — Ambulatory Visit: Payer: Medicare Other | Admitting: Orthopaedic Surgery

## 2018-10-08 MED FILL — SERTRALINE HCL 100 MG TABS: 100 | 90 days supply | Qty: 90 | Fill #0

## 2018-10-08 MED FILL — predniSONE 10 MG TABS: 10 | 90 days supply | Qty: 90 | Fill #1

## 2018-10-09 ENCOUNTER — Other Ambulatory Visit: Payer: Self-pay

## 2018-10-09 ENCOUNTER — Encounter: Payer: Self-pay | Admitting: Physical Medicine and Rehabilitation

## 2018-10-09 ENCOUNTER — Ambulatory Visit: Payer: Self-pay

## 2018-10-09 ENCOUNTER — Ambulatory Visit (INDEPENDENT_AMBULATORY_CARE_PROVIDER_SITE_OTHER): Payer: Medicare Other | Admitting: Physical Medicine and Rehabilitation

## 2018-10-09 VITALS — BP 120/68 | HR 86

## 2018-10-09 DIAGNOSIS — M47816 Spondylosis without myelopathy or radiculopathy, lumbar region: Secondary | ICD-10-CM

## 2018-10-09 MED ORDER — METHYLPREDNISOLONE ACETATE 80 MG/ML IJ SUSP: 80.0000 mg | Freq: Once | INTRAMUSCULAR | Status: DC

## 2018-10-09 NOTE — Progress Notes (Signed)
Numeric Pain Rating Scale and Functional Assessment Average Pain 4   In the last MONTH (on 0-10 scale) has pain interfered with the following?  1. General activity like being  able to carry out your everyday physical activities such as walking, climbing stairs, carrying groceries, or moving a chair?  Rating(5)    +Driver, -BT, -Dye Allergies.

## 2018-11-27 NOTE — Progress Notes (Signed)
[name] - 85 y.o. female MRN [id number]  Date of birth: 11-09-1931  Office Visit Note: Visit Date: 10/09/2018 PCP: Deland Pretty, MD Referred by: Deland Pretty, MD  Subjective: Chief Complaint  Patient presents with  . Lower Back - Pain   HPI:  [name] is a 85 y.o. female who comes in today For planned repeat bilateral L4-5 facet joint block.  Patient is followed by Dr. Rodell Perna who request the injection today.  Interestingly we saw her in the fall of last year in August and October I believe and completed diagnostic facet joint blocks which were very beneficial and we looked at radiofrequency ablation depending on how she did.  She actually reports doing quite well up until recently seeing him.  He does want to repeat the injection since it did so well.  She would still be a good candidate for radiofrequency ablation as she has had double diagnostic blocks as well as failure of conservative care and long-term axial low back pain with imaging showing facet arthritis.  She has had no new issues since she last saw Dr. Lorin Mercy.  ROS Otherwise per HPI.  Assessment & Plan: Visit Diagnoses:  1. Spondylosis without myelopathy or radiculopathy, lumbar region     Plan: No additional findings.   Meds & Orders:  Meds ordered this encounter  Medications  . methylPREDNISolone acetate (DEPO-MEDROL) injection 80 mg    Orders Placed This Encounter  Procedures  . Facet Injection  . XR C-ARM NO REPORT    Follow-up: Return if symptoms worsen or fail to improve.   Procedures: No procedures performed  Lumbar Facet Joint Intra-Articular Injection(s) with Fluoroscopic Guidance  Patient: [name]      Date of Birth: 1931/06/07 MRN: [id number] PCP: Deland Pretty, MD      Visit Date: 10/09/2018   Universal Protocol:    Date/Time: 10/09/2018  Consent Given By: the patient  Position: PRONE   Additional Comments: Vital signs were monitored before and  after the procedure. Patient was prepped and draped in the usual sterile fashion. The correct patient, procedure, and site was verified.   Injection Procedure Details:  Procedure Site One Meds Administered:  Meds ordered this encounter  Medications  . methylPREDNISolone acetate (DEPO-MEDROL) injection 80 mg     Laterality: Bilateral  Location/Site:  L4-L5  Needle size: 22 guage  Needle type: Spinal  Needle Placement: Articular  Findings:  -Comments: Excellent flow of contrast producing a partial arthrogram.  Procedure Details: The fluoroscope beam is vertically oriented in AP, and the inferior recess is visualized beneath the lower pole of the inferior apophyseal process, which represents the target point for needle insertion. When direct visualization is difficult the target point is located at the medial projection of the vertebral pedicle. The region overlying each aforementioned target is locally anesthetized with a 1 to 2 ml. volume of 1% Lidocaine without Epinephrine.   The spinal needle was inserted into each of the above mentioned facet joints using biplanar fluoroscopic guidance. A 0.25 to 0.5 ml. volume of Isovue-250 was injected and a partial facet joint arthrogram was obtained. A single spot film was obtained of the resulting arthrogram.    One to 1.25 ml of the steroid/anesthetic solution was then injected into each of the facet joints noted above.   Additional Comments:  The patient tolerated the procedure well Dressing: 2 x 2 sterile gauze and Band-Aid    Post-procedure details: Patient was observed during the procedure. Post-procedure  instructions were reviewed.  Patient left the clinic in stable condition.    Clinical History: MRI LUMBAR SPINE WITHOUT CONTRAST  TECHNIQUE: Multiplanar, multisequence MR imaging of the lumbar spine was performed. No intravenous contrast was administered.  COMPARISON:  Radiography 09/25/2017  FINDINGS:  Segmentation:  5 lumbar type vertebral bodies.  Alignment:  4 mm anterolisthesis L4-5.  Vertebrae:  No fracture or primary bone finding.  Conus medullaris and cauda equina: Conus extends to the L1 level. Conus and cauda equina appear normal.  Paraspinal and other soft tissues: Atrophic changes of the kidneys. Right iliac fossa transplant kidney.  Disc levels:  Normal at L2-3 and above.  L3-4: Mild bulging of the disc. Mild facet hypertrophy. No stenosis.  L4-5: Bilateral facet arthropathy with edema and joint effusions. 4 mm of anterolisthesis that could worsen with standing or flexion. Narrowing of both lateral recesses right more than left. Right L5 nerve root compression could occur in this location. Foraminal compromise on the right due to osteophyte and a foraminal to extraforaminal disc fragment likely to compress the right L4 nerve.  L5-S1: Mild bulging of the disc. Mild facet degeneration. No stenosis.  IMPRESSION: The significant findings are likely at L4-5 where there is advanced chronic bilateral facet arthropathy with joint edema and joint effusions allowing anterolisthesis of 4 mm. The disc bulges generally, and there is stenosis of the lateral recesses worse on the right, which could compress the right L5 nerve. There is right foraminal to extraforaminal disc herniation with a disc fragment that would seem likely to focally compress the right L4 nerve.   Electronically Signed   By: Nelson Chimes M.D.   On: 09/28/2017 11:21     Objective:  VS:  HT:    WT:   BMI:     BP:120/68  HR:86bpm  TEMP: ( )  RESP:94 % Physical Exam  Ortho Exam Imaging: No results found.

## 2018-11-27 NOTE — Procedures (Signed)
Lumbar Facet Joint Intra-Articular Injection(s) with Fluoroscopic Guidance  Patient: [name]      Date of Birth: [birth year] MRN: [id number] PCP: Deland Pretty, MD      Visit Date: 10/09/2018   Universal Protocol:    Date/Time: 10/09/2018  Consent Given By: the patient  Position: PRONE   Additional Comments: Vital signs were monitored before and after the procedure. Patient was prepped and draped in the usual sterile fashion. The correct patient, procedure, and site was verified.   Injection Procedure Details:  Procedure Site One Meds Administered:  Meds ordered this encounter  Medications  . methylPREDNISolone acetate (DEPO-MEDROL) injection 80 mg     Laterality: Bilateral  Location/Site:  L4-L5  Needle size: 22 guage  Needle type: Spinal  Needle Placement: Articular  Findings:  -Comments: Excellent flow of contrast producing a partial arthrogram.  Procedure Details: The fluoroscope beam is vertically oriented in AP, and the inferior recess is visualized beneath the lower pole of the inferior apophyseal process, which represents the target point for needle insertion. When direct visualization is difficult the target point is located at the medial projection of the vertebral pedicle. The region overlying each aforementioned target is locally anesthetized with a 1 to 2 ml. volume of 1% Lidocaine without Epinephrine.   The spinal needle was inserted into each of the above mentioned facet joints using biplanar fluoroscopic guidance. A 0.25 to 0.5 ml. volume of Isovue-250 was injected and a partial facet joint arthrogram was obtained. A single spot film was obtained of the resulting arthrogram.    One to 1.25 ml of the steroid/anesthetic solution was then injected into each of the facet joints noted above.   Additional Comments:  The patient tolerated the procedure well Dressing: 2 x 2 sterile gauze and Band-Aid    Post-procedure details: Patient was  observed during the procedure. Post-procedure instructions were reviewed.  Patient left the clinic in stable condition.

## 2019-02-24 MED FILL — MEMANTINE HCL 10 MG TABS: 10 | 30 days supply | Qty: 60 | Fill #0

## 2019-03-06 MED FILL — LEVOTHYROXINE 50 MCG TABLET: 50 | 90 days supply | Qty: 90 | Fill #0

## 2019-03-06 MED FILL — TACROLIMUS 1 MG CAPSULE: 1 | 30 days supply | Qty: 60 | Fill #0

## 2019-03-06 MED FILL — TACROLIMUS ANHYDROUS 0.5MG: 0.5 | 30 days supply | Qty: 60 | Fill #0

## 2019-03-28 MED FILL — MEMANTINE HCL 10 MG TABS: 10 | 30 days supply | Qty: 60 | Fill #1

## 2019-04-14 MED FILL — TACROLIMUS 1 MG CAPSULE: 1 | 30 days supply | Qty: 60 | Fill #1

## 2019-04-14 MED FILL — TACROLIMUS ANHYDROUS 0.5MG: 0.5 | 30 days supply | Qty: 60 | Fill #1

## 2019-04-23 DIAGNOSIS — D692 Other nonthrombocytopenic purpura: Secondary | ICD-10-CM | POA: Diagnosis not present

## 2019-04-23 DIAGNOSIS — L570 Actinic keratosis: Secondary | ICD-10-CM | POA: Diagnosis not present

## 2019-04-23 DIAGNOSIS — L814 Other melanin hyperpigmentation: Secondary | ICD-10-CM | POA: Diagnosis not present

## 2019-04-23 DIAGNOSIS — C44319 Basal cell carcinoma of skin of other parts of face: Secondary | ICD-10-CM | POA: Diagnosis not present

## 2019-04-23 DIAGNOSIS — S81809D Unspecified open wound, unspecified lower leg, subsequent encounter: Secondary | ICD-10-CM | POA: Diagnosis not present

## 2019-04-23 DIAGNOSIS — S80811A Abrasion, right lower leg, initial encounter: Secondary | ICD-10-CM | POA: Diagnosis not present

## 2019-04-23 DIAGNOSIS — Z85828 Personal history of other malignant neoplasm of skin: Secondary | ICD-10-CM | POA: Diagnosis not present

## 2019-04-23 DIAGNOSIS — D485 Neoplasm of uncertain behavior of skin: Secondary | ICD-10-CM | POA: Diagnosis not present

## 2019-04-23 DIAGNOSIS — D0439 Carcinoma in situ of skin of other parts of face: Secondary | ICD-10-CM | POA: Diagnosis not present

## 2019-04-23 DIAGNOSIS — S80812A Abrasion, left lower leg, initial encounter: Secondary | ICD-10-CM | POA: Diagnosis not present

## 2019-04-25 DIAGNOSIS — S80812A Abrasion, left lower leg, initial encounter: Secondary | ICD-10-CM | POA: Diagnosis not present

## 2019-04-25 DIAGNOSIS — S80811A Abrasion, right lower leg, initial encounter: Secondary | ICD-10-CM | POA: Diagnosis not present

## 2019-04-28 DIAGNOSIS — L089 Local infection of the skin and subcutaneous tissue, unspecified: Secondary | ICD-10-CM | POA: Diagnosis not present

## 2019-04-28 MED FILL — predniSONE 10 MG TABS: 10 | 90 days supply | Qty: 90 | Fill #0

## 2019-04-28 MED FILL — MEMANTINE HCL 10 MG TABS: 10 | 30 days supply | Qty: 60 | Fill #2

## 2019-04-28 MED FILL — MIRTAZAPINE 15 MG TABLET: 15 | 90 days supply | Qty: 90 | Fill #0

## 2019-04-28 MED FILL — SULFAMETHOXAZOLE-TMP DS TAB: 800-160 | 10 days supply | Qty: 20 | Fill #0

## 2019-04-30 ENCOUNTER — Ambulatory Visit: Payer: Medicare Other | Admitting: Cardiovascular Disease

## 2019-04-30 DIAGNOSIS — Z5189 Encounter for other specified aftercare: Secondary | ICD-10-CM | POA: Diagnosis not present

## 2019-05-02 DIAGNOSIS — Z5189 Encounter for other specified aftercare: Secondary | ICD-10-CM | POA: Diagnosis not present

## 2019-05-07 DIAGNOSIS — Z5189 Encounter for other specified aftercare: Secondary | ICD-10-CM | POA: Diagnosis not present

## 2019-05-09 DIAGNOSIS — Z5189 Encounter for other specified aftercare: Secondary | ICD-10-CM | POA: Diagnosis not present

## 2019-05-09 MED FILL — SERTRALINE HCL 100 MG TABS: 100 | 90 days supply | Qty: 90 | Fill #0

## 2019-05-12 DIAGNOSIS — Z5189 Encounter for other specified aftercare: Secondary | ICD-10-CM | POA: Diagnosis not present

## 2019-05-12 DIAGNOSIS — Z940 Kidney transplant status: Secondary | ICD-10-CM | POA: Diagnosis not present

## 2019-05-14 DIAGNOSIS — Z5189 Encounter for other specified aftercare: Secondary | ICD-10-CM | POA: Diagnosis not present

## 2019-05-16 DIAGNOSIS — Z5189 Encounter for other specified aftercare: Secondary | ICD-10-CM | POA: Diagnosis not present

## 2019-05-19 DIAGNOSIS — Z5189 Encounter for other specified aftercare: Secondary | ICD-10-CM | POA: Diagnosis not present

## 2019-05-21 DIAGNOSIS — Z5189 Encounter for other specified aftercare: Secondary | ICD-10-CM | POA: Diagnosis not present

## 2019-05-22 MED FILL — TACROLIMUS ANHYDROUS 0.5MG: 0.5 | 30 days supply | Qty: 60 | Fill #2

## 2019-05-22 MED FILL — TACROLIMUS 1 MG CAPSULE: 1 | 30 days supply | Qty: 60 | Fill #2

## 2019-05-23 DIAGNOSIS — Z5189 Encounter for other specified aftercare: Secondary | ICD-10-CM | POA: Diagnosis not present

## 2019-05-28 DIAGNOSIS — D0439 Carcinoma in situ of skin of other parts of face: Secondary | ICD-10-CM | POA: Diagnosis not present

## 2019-05-28 DIAGNOSIS — S81809D Unspecified open wound, unspecified lower leg, subsequent encounter: Secondary | ICD-10-CM | POA: Diagnosis not present

## 2019-05-28 DIAGNOSIS — L814 Other melanin hyperpigmentation: Secondary | ICD-10-CM | POA: Diagnosis not present

## 2019-05-28 DIAGNOSIS — Z85828 Personal history of other malignant neoplasm of skin: Secondary | ICD-10-CM | POA: Diagnosis not present

## 2019-05-29 ENCOUNTER — Other Ambulatory Visit: Payer: Self-pay

## 2019-05-29 ENCOUNTER — Ambulatory Visit (INDEPENDENT_AMBULATORY_CARE_PROVIDER_SITE_OTHER): Payer: Medicare PPO | Admitting: Cardiovascular Disease

## 2019-05-29 ENCOUNTER — Encounter: Payer: Self-pay | Admitting: Cardiovascular Disease

## 2019-05-29 VITALS — BP 131/76 | HR 73 | Temp 97.0°F | Ht 67.0 in | Wt 117.8 lb

## 2019-05-29 DIAGNOSIS — R55 Syncope and collapse: Secondary | ICD-10-CM

## 2019-05-29 DIAGNOSIS — I456 Pre-excitation syndrome: Secondary | ICD-10-CM

## 2019-05-29 NOTE — Patient Instructions (Signed)
Medication Instructions:  Your physician recommends that you continue on your current medications as directed. Please refer to the Current Medication list given to you today.   *If you need a refill on your cardiac medications before your next appointment, please call your pharmacy*  Lab Work: NONE  Testing/Procedures: NONE  Follow-Up: AS NEEDED    

## 2019-05-29 NOTE — Progress Notes (Signed)
Cardiology Office Note   Date:  05/29/2019   ID:  Leoni, Goodness July 07, 1931, MRN [id number]  PCP:  Deland Pretty, MD  Cardiologist:   Skeet Latch, MD   No chief complaint on file.     History of Present Illness: [name] is a 86 y.o. female with WPW, renal transplant (2012), breast cancer s/p mastectomy, hypothyroidism and dementia who presents for follow up.  She saw Kerin Ransom, PA-C on 09/2017 after episodes of recurrent syncope. She has experienced multiple episodes of syncope.  They started around April 2018.  Cynthia first occurred when she was standing in Cynthia doorway and she slid to Cynthia floor.  She injured her back but did not hit her head.  Another occurred when she was getting out of bed and got lightheaded and passed out.  She became diaphoretic and her husband had difficulty picking her up so EMS was called.  She is also had episodes when she stood up after working on a computer and felt like her legs were not working.  She did not pass out but was unable to move.  She notes that she has been eating and drinking as well lately.  She is also been losing weight due to lack of appetite.  She has started drinking Ensure.  She saw Kerin Ransom on 09/2017 and was referred for an echocardiogram 10/06/2017 that revealed LVEF 65 to 70% with grade 1 diastolic dysfunction and moderate tricuspid regurgitation.  She had a 30-day event monitor that revealed occasional PACs and PVCs but no significant arrhythmias.  She previously had an EKG in 2015 that was concerning for WPW.  She has not expands any chest pain or shortness of breath.  She denies any palpitations, lower extremity edema, orthopnea, or PND.  She has not had any kind of warning before she passes out.  She is also been evaluated by neurology due to concern that Cynthia episodes may be seizure.  However they do not seem to think that she actually has any seizure activity.  Since her last appointment [name] has  done well from a heart standpoint.  She had a fall and fractured her pelvis 05/2018.  She denies any recent falls.  She also had significant weight loss in Cynthia last year but seems to be doing better now.  Her weight got down to 100 pounds and is now back at 116 pounds.  She had no chest pain or shortness of breath.  She walks her dog daily and also sometimes goes for walks on her own.  She plays chair volleyball 3 days/week.  She has no exertional chest pain or shortness of breath.  Overall she has been doing well.  She has no lower extremity edema, orthopnea, or PND.  She continues to struggle with her memory.  Her husband lives with her and helps to take care of her.   Past Medical History:  Diagnosis Date  . Arthritis   . Cancer ([place]) 1988   BREAST CANCER- MASTECTOMY RIGHT - NO CHEMO NO RADIATION  . CMV (cytomegalovirus infection) ([place])   . Depression with anxiety   . Dizziness   . Gout   . History of breast cancer   . Hypertension   . Hypothyroidism   . Iron deficiency anemia   . Memory loss   . Osteoarthritis   . Osteopenia   . Patella fracture   . Peripheral neuropathy   . Raynauds phenomenon   . Renal disorder  HX OF KIDNEY TRANSPLANT - STATES KIDNEY FUNCTION OK -DR. SANDFORD / DR. FOX -   [facility]  . Sinus problem    HX OF SINUS INFECTIONS   . Thyroid disease   . Vitamin D deficiency   . Wolff-Parkinson-White (WPW) syndrome     Past Surgical History:  Procedure Laterality Date  . ABDOMINAL HYSTERECTOMY    . BREAST SURGERY     RIGHT MASTECTOMY AND AXILLARY NODE DISSECTION  . JOINT REPLACEMENT     LEFT TOTAL KNEE REPLACEMENT   . KIDNEY TRANSPLANT  march 2012   right side - surgery at [facility]  . ORIF PATELLA Right 07/22/2013   Procedure: OPEN REDUCTION INTERNAL (ORIF) FIXATION PATELLA;  Surgeon: Mauri Pole, MD;  Location: [facility];  Service: Orthopedics;  Laterality: Right;  . SINUS SURGERY WITH INSTATRAK       Current Outpatient  Medications  Medication Sig Dispense Refill  . acetaminophen (TYLENOL) 325 MG tablet Take 325 mg by mouth as needed (pain).     Marland Kitchen alendronate (FOSAMAX) 70 MG tablet Take 70 mg by mouth every 7 (seven) days.    . Calcium Carbonate (CALCIUM 600 PO) Take 1 tablet by mouth 2 (two) times daily.    . cholecalciferol (VITAMIN D) 1000 UNITS tablet Take 2,000 Units by mouth daily.     Marland Kitchen denosumab (PROLIA) 60 MG/ML SOLN injection Inject 60 mg into Cynthia skin every 6 (six) months. Administer in upper arm, thigh, or abdomen    . levothyroxine (SYNTHROID, LEVOTHROID) 50 MCG tablet Take 50 mcg by mouth daily.    . memantine (NAMENDA) 10 MG tablet TAKE 1 TABLET BY MOUTH TWICE DAILY 60 tablet 11  . mirtazapine (REMERON) 7.5 MG tablet   12  . oxycodone (OXY-IR) 5 MG capsule Take 5 mg by mouth 2 (two) times daily as needed.    . predniSONE (DELTASONE) 5 MG tablet Take 10 mg by mouth daily.     . sertraline (ZOLOFT) 50 MG tablet Take 2 tablets (100 mg total) by mouth daily. 60 tablet 11  . tacrolimus (PROGRAF) 1 MG capsule Take 2 mg by mouth 2 (two) times daily.      Current Facility-Administered Medications  Medication Dose Route Frequency Provider Last Rate Last Admin  . betamethasone acetate-betamethasone sodium phosphate (CELESTONE) injection 12 mg  12 mg Other Once Magnus Sinning, MD      . methylPREDNISolone acetate (DEPO-MEDROL) injection 80 mg  80 mg Other Once Magnus Sinning, MD        Allergies:   Cephalexin, Augmentin [amoxicillin-pot clavulanate], Doxycycline, and Minocycline    Social History:  [name]  reports that she has never smoked. She has never used smokeless tobacco. She reports current alcohol use. She reports that she does not use drugs.   Family History:  [name]'s family history includes Cancer in her brother; Dementia in her father and sister; Other in her mother.    ROS:  Please see Cynthia history of present illness.   Otherwise, review of systems are positive for none.    All other systems are reviewed and negative.    PHYSICAL EXAM: VS:  BP 131/76   Pulse 73   Temp (!) 97 F (36.1 C)   Ht 5\' 7"  (1.702 m)   Wt 117 lb 12.8 oz (53.4 kg)   SpO2 99%   BMI 18.45 kg/m  , BMI Body mass index is 18.45 kg/m. GENERAL:  Well appearing HEENT: Pupils equal round and  reactive, fundi not visualized, oral mucosa unremarkable NECK:  No jugular venous distention, waveform within normal limits, carotid upstroke brisk and symmetric, no bruits LUNGS:  Clear to auscultation bilaterally HEART:  RRR.  PMI not displaced or sustained,S1 and S2 within normal limits, no S3, no S4, no clicks, no rubs, no murmurs ABD:  Flat, positive bowel sounds normal in frequency in pitch, no bruits, no rebound, no guarding, no midline pulsatile mass, no hepatomegaly, no splenomegaly EXT:  2 plus pulses throughout, no edema, no cyanosis no clubbing SKIN:  No rashes no nodules NEURO:  Cranial nerves II through XII grossly intact, motor grossly intact throughout PSYCH:  Cognitively intact, oriented to person place and time   EKG:  EKG is not ordered today.   Recent Labs: No results found for requested labs within last 8760 hours.    Lipid Panel No results found for: CHOL, TRIG, HDL, CHOLHDL, VLDL, LDLCALC, LDLDIRECT    Wt Readings from Last 3 Encounters:  05/29/19 117 lb 12.8 oz (53.4 kg)  07/09/18 103 lb (46.7 kg)  06/11/18 100 lb (45.4 kg)      ASSESSMENT AND PLAN:  # Recurrent syncope:   # WPW: [name] has recurrent syncopal episodes.  Cynthia last was over a year ago.  She reports that her diet has been good and she has very minimal lightheadedness upon standing.  We discussed making sure she is eating and drinking well.  She is stable from a cardiac standpoint.  No additional testing is needed at this time.  She does not drive due to dementia.     Current medicines are reviewed at length with [name] today.  [name] does not have concerns regarding  medicines.  Cynthia following changes have been made:  no change  Labs/ tests ordered today include:  No orders of Cynthia defined types were placed in this encounter.    Disposition:   FU with Marciana Uplinger C. Oval Linsey, MD, [facility] as needed    Signed, Sargeant Oval Linsey, MD, [facility]  05/29/2019 10:02 AM    [place]

## 2019-06-04 DIAGNOSIS — Z940 Kidney transplant status: Secondary | ICD-10-CM | POA: Diagnosis not present

## 2019-06-04 DIAGNOSIS — D849 Immunodeficiency, unspecified: Secondary | ICD-10-CM | POA: Diagnosis not present

## 2019-06-04 DIAGNOSIS — T50905A Adverse effect of unspecified drugs, medicaments and biological substances, initial encounter: Secondary | ICD-10-CM | POA: Diagnosis not present

## 2019-06-04 DIAGNOSIS — Z8619 Personal history of other infectious and parasitic diseases: Secondary | ICD-10-CM | POA: Diagnosis not present

## 2019-06-04 DIAGNOSIS — I10 Essential (primary) hypertension: Secondary | ICD-10-CM | POA: Diagnosis not present

## 2019-06-12 MED FILL — MEMANTINE HCL 10 MG TABS: 10 | 30 days supply | Qty: 60 | Fill #3

## 2019-06-30 ENCOUNTER — Other Ambulatory Visit (HOSPITAL_BASED_OUTPATIENT_CLINIC_OR_DEPARTMENT_OTHER): Payer: Self-pay | Admitting: Internal Medicine

## 2019-06-30 MED FILL — TACROLIMUS 1 MG CAPSULE: 1 | 30 days supply | Qty: 60 | Fill #3

## 2019-06-30 MED FILL — TACROLIMUS ANHYDROUS 0.5MG: 0.5 | 30 days supply | Qty: 60 | Fill #3

## 2019-06-30 MED FILL — LEVOTHYROXINE 50 MCG TABLET: 50 | 90 days supply | Qty: 90 | Fill #0

## 2019-07-14 ENCOUNTER — Other Ambulatory Visit: Payer: Self-pay | Admitting: Neurology

## 2019-07-14 MED FILL — MEMANTINE HCL 10 MG TABS: 10 | 30 days supply | Qty: 60 | Fill #0

## 2019-08-06 MED FILL — TACROLIMUS 1 MG CAPSULE: 1 | 30 days supply | Qty: 60 | Fill #4

## 2019-08-06 MED FILL — TACROLIMUS ANHYDROUS 0.5MG: 0.5 | 30 days supply | Qty: 60 | Fill #4

## 2019-08-06 MED FILL — SERTRALINE HCL 100 MG TABS: 100 | 90 days supply | Qty: 90 | Fill #1

## 2019-08-21 MED FILL — PREDNISONE 10 MG TABS: 10 | 90 days supply | Qty: 90 | Fill #0

## 2019-08-21 MED FILL — MEMANTINE HCL 10 MG TABS: 10 | 30 days supply | Qty: 60 | Fill #1

## 2019-09-30 ENCOUNTER — Other Ambulatory Visit (HOSPITAL_BASED_OUTPATIENT_CLINIC_OR_DEPARTMENT_OTHER): Payer: Self-pay | Admitting: Nephrology

## 2019-10-02 ENCOUNTER — Other Ambulatory Visit (HOSPITAL_BASED_OUTPATIENT_CLINIC_OR_DEPARTMENT_OTHER): Payer: Self-pay | Admitting: Internal Medicine

## 2019-10-02 MED FILL — ROSUVASTATIN CALCIUM 5 MG T: 5 | 30 days supply | Qty: 30 | Fill #0

## 2019-10-21 MED FILL — ROSUVASTATIN CALCIUM 5 MG T: 5 | 30 days supply | Qty: 30 | Fill #0

## 2019-10-27 MED FILL — LEVOTHYROXINE SODIUM 50 MCG: 50 | 90 days supply | Qty: 90 | Fill #1

## 2020-01-01 ENCOUNTER — Other Ambulatory Visit: Payer: Self-pay | Admitting: Neurology

## 2020-01-02 MED FILL — predniSONE 10 MG TABS: 10 | 90 days supply | Qty: 90 | Fill #0

## 2020-01-05 ENCOUNTER — Other Ambulatory Visit (HOSPITAL_BASED_OUTPATIENT_CLINIC_OR_DEPARTMENT_OTHER): Payer: Self-pay | Admitting: Internal Medicine

## 2020-01-05 MED FILL — MEMANTINE HCL 10 MG TABS: 10 | 90 days supply | Qty: 180 | Fill #0

## 2020-01-19 ENCOUNTER — Other Ambulatory Visit (HOSPITAL_BASED_OUTPATIENT_CLINIC_OR_DEPARTMENT_OTHER): Payer: Self-pay | Admitting: Internal Medicine

## 2020-01-20 MED FILL — SERTRALINE HCL 100 MG TABS: 100 | 90 days supply | Qty: 90 | Fill #0

## 2020-02-12 ENCOUNTER — Emergency Department (HOSPITAL_BASED_OUTPATIENT_CLINIC_OR_DEPARTMENT_OTHER): Payer: Medicare PPO

## 2020-02-12 ENCOUNTER — Emergency Department (HOSPITAL_BASED_OUTPATIENT_CLINIC_OR_DEPARTMENT_OTHER)
Admission: EM | Admit: 2020-02-12 | Discharge: 2020-02-12 | Disposition: A | Discharge status: Home / Self Care | Payer: Medicare PPO | Attending: Emergency Medicine | Admitting: Emergency Medicine

## 2020-02-12 ENCOUNTER — Encounter (HOSPITAL_BASED_OUTPATIENT_CLINIC_OR_DEPARTMENT_OTHER): Payer: Self-pay | Admitting: *Deleted

## 2020-02-12 DIAGNOSIS — Z96652 Presence of left artificial knee joint: Secondary | ICD-10-CM | POA: Diagnosis not present

## 2020-02-12 DIAGNOSIS — S0990XA Unspecified injury of head, initial encounter: Secondary | ICD-10-CM | POA: Diagnosis present

## 2020-02-12 DIAGNOSIS — E039 Hypothyroidism, unspecified: Secondary | ICD-10-CM | POA: Insufficient documentation

## 2020-02-12 DIAGNOSIS — S81011A Laceration without foreign body, right knee, initial encounter: Secondary | ICD-10-CM | POA: Diagnosis not present

## 2020-02-12 DIAGNOSIS — S0181XA Laceration without foreign body of other part of head, initial encounter: Secondary | ICD-10-CM | POA: Diagnosis not present

## 2020-02-12 DIAGNOSIS — Z79899 Other long term (current) drug therapy: Secondary | ICD-10-CM | POA: Insufficient documentation

## 2020-02-12 DIAGNOSIS — S0083XA Contusion of other part of head, initial encounter: Secondary | ICD-10-CM

## 2020-02-12 DIAGNOSIS — W19XXXA Unspecified fall, initial encounter: Secondary | ICD-10-CM | POA: Diagnosis not present

## 2020-02-12 DIAGNOSIS — Z853 Personal history of malignant neoplasm of breast: Secondary | ICD-10-CM | POA: Diagnosis not present

## 2020-02-12 DIAGNOSIS — I10 Essential (primary) hypertension: Secondary | ICD-10-CM | POA: Diagnosis not present

## 2020-02-12 DIAGNOSIS — Y9301 Activity, walking, marching and hiking: Secondary | ICD-10-CM | POA: Diagnosis not present

## 2020-02-12 LAB — CBC WITH DIFFERENTIAL/PLATELET
Abs Immature Granulocytes: 0.05 10*3/uL (ref 0.00–0.07)
Basophils Absolute: 0 10*3/uL (ref 0.0–0.1)
Basophils Relative: 1 %
Eosinophils Absolute: 0.1 10*3/uL (ref 0.0–0.5)
Eosinophils Relative: 1 %
HCT: 39.9 % (ref 36.0–46.0)
Hemoglobin: 13.5 g/dL (ref 12.0–15.0)
Immature Granulocytes: 1 %
Lymphocytes Relative: 7 %
Lymphs Abs: 0.6 10*3/uL — ABNORMAL LOW (ref 0.7–4.0)
MCH: 30.9 pg (ref 26.0–34.0)
MCHC: 33.8 g/dL (ref 30.0–36.0)
MCV: 91.3 fL (ref 80.0–100.0)
Monocytes Absolute: 0.3 10*3/uL (ref 0.1–1.0)
Monocytes Relative: 4 %
Neutro Abs: 7.6 10*3/uL (ref 1.7–7.7)
Neutrophils Relative %: 86 %
Platelets: 170 10*3/uL (ref 150–400)
RBC: 4.37 MIL/uL (ref 3.87–5.11)
RDW: 14.4 % (ref 11.5–15.5)
WBC: 8.7 10*3/uL (ref 4.0–10.5)
nRBC: 0 % (ref 0.0–0.2)

## 2020-02-12 LAB — URINALYSIS, MICROSCOPIC (REFLEX)

## 2020-02-12 LAB — URINALYSIS, ROUTINE W REFLEX MICROSCOPIC
Bilirubin Urine: NEGATIVE
Glucose, UA: NEGATIVE mg/dL
Ketones, ur: NEGATIVE mg/dL
Leukocytes,Ua: NEGATIVE
Nitrite: NEGATIVE
Protein, ur: NEGATIVE mg/dL
Specific Gravity, Urine: 1.025 (ref 1.005–1.030)
pH: 6 (ref 5.0–8.0)

## 2020-02-12 LAB — BASIC METABOLIC PANEL
Anion gap: 10 (ref 5–15)
BUN: 23 mg/dL (ref 8–23)
CO2: 29 mmol/L (ref 22–32)
Calcium: 9.4 mg/dL (ref 8.9–10.3)
Chloride: 101 mmol/L (ref 98–111)
Creatinine, Ser: 1.45 mg/dL — ABNORMAL HIGH (ref 0.44–1.00)
GFR, Estimated: 35 mL/min — ABNORMAL LOW (ref >60)
Glucose, Bld: 98 mg/dL (ref 70–99)
Potassium: 3.7 mmol/L (ref 3.5–5.1)
Sodium: 140 mmol/L (ref 135–145)

## 2020-02-12 MED ORDER — LIDOCAINE HCL (PF) 1 % IJ SOLN
5.0000 mL | Freq: Once | INTRAMUSCULAR | Status: AC
  Administered 2020-02-12: 5 mL
  Filled 2020-02-12: qty 5

## 2020-02-12 NOTE — ED Provider Notes (Signed)
[facility] EMERGENCY DEPARTMENT Provider Note   CSN: [id number] Arrival date & time: 02/12/20  1339     History Chief Complaint  Patient presents with   Laceration   Fall    [name] is a 86 y.o. female.  HPI   Patient with significant medical history of hypertension, iron deficiency, peripheral neuropathy, memory loss, will Parkinson's white syndrome hypertension, presents to the emergency department after having a fall.  Patient states while she was out walking her dog she fell onto the ground landing on her head and arm.  She denies losing conscious, is not on any anticoagulant.  She states she does not exactly remember how she fell she is unsure whether she tripped or if the dog had pulled her. she complains of a slight headache and some mild rib pain from where she fell.  She denies chest pain, shortness of breath, nausea, vomiting, change in vision, weakness or paresthesias in the upper or lower extremities.  Patient's husband who is bedside states she has severe memory loss and that she has multiple falls.  He states this fall was unwitnessed and that the neighbors came and helped her up to the ground and called EMS for help.  Patient denies fevers, chills, shortness of breath, chest pain, abdominal pain, nausea, vomiting, diarrhea, pedal edema.  Past Medical History:  Diagnosis Date   Arthritis    Cancer ([place]) 1988   BREAST CANCER- MASTECTOMY RIGHT - NO CHEMO NO RADIATION   CMV (cytomegalovirus infection) ([place])    Depression with anxiety    Dizziness    Gout    History of breast cancer    Hypertension    Hypothyroidism    Iron deficiency anemia    Memory loss    Osteoarthritis    Osteopenia    Patella fracture    Peripheral neuropathy    Raynauds phenomenon    Renal disorder    HX OF KIDNEY TRANSPLANT - STATES KIDNEY FUNCTION OK -DR. SANDFORD / DR. FOX -   [facility]   Sinus problem    HX OF SINUS INFECTIONS     Thyroid disease    Vitamin D deficiency    Wolff-Parkinson-White (WPW) syndrome     Patient Active Problem List   Diagnosis Date Noted   Closed pelvic ring fracture ([place]) 06/11/2018   Memory loss 06/10/2018   Gait abnormality 06/10/2018   Lumbar foraminal stenosis 03/27/2018   Facet arthropathy, lumbar 12/19/2017   Stenosis of lateral recess of lumbar spine 12/19/2017   Syncope 10/03/2017   History of breast cancer 10/03/2017   Back pain 10/03/2017   WPW (Wolff-Parkinson-White syndrome) 10/03/2017   Polyneuropathy associated with underlying disease ([place]) 06/06/2017   Mild cognitive impairment 04/23/2017   Dizziness 04/23/2017   Expected blood loss anemia 07/23/2013   S/P Right patella ORIF 07/22/2013   Renal transplant disorder 06/07/2012   Unspecified hypothyroidism 06/07/2012   Infection, atypical mycobacterium 06/07/2012    Past Surgical History:  Procedure Laterality Date   ABDOMINAL HYSTERECTOMY     BREAST SURGERY     RIGHT MASTECTOMY AND AXILLARY NODE DISSECTION   JOINT REPLACEMENT     LEFT TOTAL KNEE REPLACEMENT    KIDNEY TRANSPLANT  march 2012   right side - surgery at [facility]   ORIF PATELLA Right 07/22/2013   Procedure: OPEN REDUCTION INTERNAL (ORIF) FIXATION PATELLA;  Surgeon: Mauri Pole, MD;  Location: [facility];  Service: Orthopedics;  Laterality: Right;   SINUS  SURGERY WITH INSTATRAK       OB History   No obstetric history on file.     Family History  Problem Relation Age of Onset   Other Mother        unsure of history    Dementia Father    Dementia Sister    Cancer Brother        lung    Social History   Tobacco Use   Smoking status: Never Smoker   Smokeless tobacco: Never Used  Scientific laboratory technician Use: Never used  Substance Use Topics   Alcohol use: Yes    Comment: MAYBE ONE GLASS OF WINE EVERY FEW MONTHS   Drug use: No    Home Medications Prior to Admission medications    Medication Sig Start Date End Date Taking? Authorizing Provider  acetaminophen (TYLENOL) 325 MG tablet Take 325 mg by mouth as needed (pain).     [provider]  alendronate (FOSAMAX) 70 MG tablet Take 70 mg by mouth every 7 (seven) days.    [provider]  Calcium Carbonate (CALCIUM 600 PO) Take 1 tablet by mouth 2 (two) times daily.    [provider]  cholecalciferol (VITAMIN D) 1000 UNITS tablet Take 2,000 Units by mouth daily.     [provider]  denosumab (PROLIA) 60 MG/ML SOLN injection Inject 60 mg into the skin every 6 (six) months. Administer in upper arm, thigh, or abdomen    [provider]  levothyroxine (SYNTHROID, LEVOTHROID) 50 MCG tablet Take 50 mcg by mouth daily.    [provider]  memantine (NAMENDA) 10 MG tablet Take 1 tablet (10 mg total) by mouth 2 (two) times daily. Please request future refills from PCP. 07/14/19   Marcial Pacas, MD  mirtazapine (REMERON) 7.5 MG tablet  08/28/17   [provider]  oxycodone (OXY-IR) 5 MG capsule Take 5 mg by mouth 2 (two) times daily as needed.    [provider]  predniSONE (DELTASONE) 5 MG tablet Take 10 mg by mouth daily.     [provider]  sertraline (ZOLOFT) 50 MG tablet Take 2 tablets (100 mg total) by mouth daily. 06/10/18   Marcial Pacas, MD  tacrolimus (PROGRAF) 1 MG capsule Take 2 mg by mouth 2 (two) times daily.     [provider]    Allergies    Cephalexin, Augmentin [amoxicillin-pot clavulanate], Doxycycline, and Minocycline  Review of Systems   Review of Systems  Constitutional: Negative for chills and fever.  HENT: Negative for congestion, tinnitus, trouble swallowing and voice change.   Eyes: Negative for visual disturbance.  Respiratory: Negative for cough and shortness of breath.   Cardiovascular: Negative for chest pain.  Gastrointestinal: Negative for abdominal pain, diarrhea, nausea and vomiting.  Genitourinary: Negative  for enuresis, flank pain, frequency and genital sores.  Musculoskeletal: Negative for back pain.  Skin: Negative for rash.  Neurological: Positive for headaches. Negative for dizziness.  Hematological: Does not bruise/bleed easily.    Physical Exam Updated Vital Signs BP (!) 94/59 (BP Location: Right Arm)    Pulse 73    Temp 98.3 F (36.8 C) (Oral)    Resp 16    Ht 5\' 7"  (1.702 m)    Wt 50.8 kg    SpO2 97%    BMI 17.54 kg/m   Physical Exam Vitals and nursing note reviewed.  Constitutional:      General: She is not in acute distress.  Appearance: Normal appearance. She is not ill-appearing or diaphoretic.  HENT:     Head: Normocephalic and atraumatic.     Comments: Patient's had had a small abrasion noted on the right side of her forehead and measured about 3 cm in length 1 cm deep.  Hemodynamically stable.  Patient head was palpated, it was nontender to palpation, no deformities or crepitus felt.     Right Ear: Tympanic membrane and external ear normal. There is no impacted cerumen.     Ears:     Comments: On exam patient had blood noted on the external part of her right ear.  Using otoscope ear canal was visualized TM was pearly white no signs of infection noted, ear canal had a small abrasion at the 6 o'clock position no signs infection noted.    Nose: No congestion or rhinorrhea.     Mouth/Throat:     Mouth: Mucous membranes are moist.     Pharynx: Oropharynx is clear.  Eyes:     General: No visual field deficit or scleral icterus.    Conjunctiva/sclera: Conjunctivae normal.     Pupils: Pupils are equal, round, and reactive to light.  Cardiovascular:     Rate and Rhythm: Normal rate and regular rhythm.     Pulses: Normal pulses.     Heart sounds: No murmur heard.  No friction rub. No gallop.   Pulmonary:     Effort: Pulmonary effort is normal. No respiratory distress.     Breath sounds: No wheezing, rhonchi or rales.  Abdominal:     General: There is no distension.      Palpations: Abdomen is soft.     Tenderness: There is no abdominal tenderness. There is no right CVA tenderness, left CVA tenderness or guarding.  Musculoskeletal:        General: Signs of injury present. No swelling or tenderness.     Right lower leg: No edema.     Left lower leg: No edema.     Comments: Patient had 5 of 5 strength, full range of motion, neurovascular fully intact in the upper lower extremities.  Patient's back was visualized, no gross abnormalities noted, it was nontender to palpation, no step-off, crepitus, gross deformities palpated.  Patient had a noted skin tear on the lateral aspect of her right knee.  Hemodynamically stable, no skin present to pull over and close wound.  Skin:    General: Skin is warm and dry.     Findings: No rash.  Neurological:     General: No focal deficit present.     Mental Status: She is alert.     GCS: GCS eye subscore is 4. GCS verbal subscore is 5. GCS motor subscore is 6.     Cranial Nerves: Cranial nerves are intact. No cranial nerve deficit or facial asymmetry.     Sensory: Sensation is intact. No sensory deficit.     Motor: Motor function is intact. No weakness or pronator drift.     Coordination: Coordination is intact. Romberg sign negative. Finger-Nose-Finger Test and Heel to [facility] Test normal.  Psychiatric:        Mood and Affect: Mood normal.     ED Results / Procedures / Treatments   Labs (all labs ordered are listed, but only abnormal results are displayed) Labs Reviewed  BASIC METABOLIC PANEL - Abnormal; Notable for the following components:      Result Value   Creatinine, Ser 1.45 (*)    GFR, Estimated 35 (*)  All other components within normal limits  CBC WITH DIFFERENTIAL/PLATELET - Abnormal; Notable for the following components:   Lymphs Abs 0.6 (*)    All other components within normal limits  URINALYSIS, ROUTINE W REFLEX MICROSCOPIC - Abnormal; Notable for the following components:   Hgb urine dipstick  TRACE (*)    All other components within normal limits  URINALYSIS, MICROSCOPIC (REFLEX) - Abnormal; Notable for the following components:   Bacteria, UA FEW (*)    All other components within normal limits    EKG EKG Interpretation  Date/Time:  Thursday February 12 2020 14:43:13 EDT Ventricular Rate:  72 PR Interval:    QRS Duration: 92 QT Interval:  407 QTC Calculation: 446 R Axis:   65 Text Interpretation: Sinus rhythm Anterior infarct, old Abnormal T, consider ischemia, lateral leads Confirmed by Davonna Belling [phone]) on 02/12/2020 5:34:46 PM   Radiology DG Chest 2 View  Result Date: 02/12/2020 CLINICAL DATA:  [place] today, multiple lacerations, hypertension, breast cancer EXAM: CHEST - 2 VIEW COMPARISON:  08/19/2017 FINDINGS: Upper normal heart size. Mediastinal contours and pulmonary vascularity normal. Atherosclerotic calcification aorta. Bronchitic changes and hyperinflation consistent with COPD. No infiltrate, pleural effusion, or pneumothorax. Bones appear demineralized. Post RIGHT mastectomy. IMPRESSION: COPD changes. No acute abnormalities. Aortic Atherosclerosis (ICD10-I70.0) Emphysema (ICD10-J43.9). Electronically Signed   By: Lavonia Dana M.D.   On: 02/12/2020 15:17   CT Head Wo Contrast  Result Date: 02/12/2020 CLINICAL DATA:  Facial trauma. Neck trauma. Additional history provided: Laceration above right eye and to right ear. EXAM: CT HEAD WITHOUT CONTRAST CT MAXILLOFACIAL WITHOUT CONTRAST CT CERVICAL SPINE WITHOUT CONTRAST TECHNIQUE: Multidetector CT imaging of the head, cervical spine, and maxillofacial structures were performed using the standard protocol without intravenous contrast. Multiplanar CT image reconstructions of the cervical spine and maxillofacial structures were also generated. COMPARISON:  Head CT 08/19/2017.  Maxillofacial CT 02/17/2004. FINDINGS: CT HEAD FINDINGS Brain: Mild generalized cerebral atrophy. Moderate ill-defined hypoattenuation within the  cerebral white matter is nonspecific, but compatible with chronic small vessel ischemic disease. There is no acute intracranial hemorrhage. No demarcated cortical infarct. No extra-axial fluid collection. No evidence of intracranial mass. No midline shift. Vascular: No hyperdense vessel.  Atherosclerotic calcifications. Skull: Normal. Negative for fracture or focal lesion. Other: No significant mastoid effusion. CT MAXILLOFACIAL FINDINGS Osseous: No acute maxillofacial fracture is identified. Orbits: The globes are normal in size and contour. The extraocular muscles and optic nerve sheath complexes are symmetric and unremarkable. Sinuses: Minimal ethmoid sinus mucosal thickening. Soft tissues: Right forehead/periorbital hematoma/laceration. CT CERVICAL SPINE FINDINGS Alignment: Trace C3-C4 grade 1 anterolisthesis. 5 mm grade II C4-C5 grade 1 anterolisthesis. Skull base and vertebrae: The basion-dental and atlanto-dental intervals are maintained.No evidence of acute fracture to the cervical spine. Soft tissues and spinal canal: No prevertebral fluid or swelling. No visible canal hematoma. Disc levels: Cervical spondylosis with multilevel disc space narrowing, disc bulges, uncovertebral hypertrophy and facet arthrosis. Disc space narrowing is severe at C2-C3, C4-C5, C5-C6 and C6-C7. Upper chest: No consolidation within the imaged lung apices. No visible pneumothorax. IMPRESSION: CT head: 1. No evidence of acute intracranial abnormality. 2. Mild cerebral atrophy with moderate chronic small vessel ischemic disease. CT maxillofacial: 1. No evidence of acute maxillofacial fracture. 2. Right forehead/periorbital hematoma/laceration. CT cervical spine: 1. No evidence of acute fracture to the cervical spine. 2. 5 mm grade II C4-C5 grade 1 anterolisthesis. There is significant facet arthropathy at this level and this is likely degenerative in etiology. 3. Mild C3-C4 grade 1  anterolisthesis. 4. Cervical spondylosis as  described. Electronically Signed   By: Kellie Simmering DO   On: 02/12/2020 15:30   CT Cervical Spine Wo Contrast  Result Date: 02/12/2020 CLINICAL DATA:  Facial trauma. Neck trauma. Additional history provided: Laceration above right eye and to right ear. EXAM: CT HEAD WITHOUT CONTRAST CT MAXILLOFACIAL WITHOUT CONTRAST CT CERVICAL SPINE WITHOUT CONTRAST TECHNIQUE: Multidetector CT imaging of the head, cervical spine, and maxillofacial structures were performed using the standard protocol without intravenous contrast. Multiplanar CT image reconstructions of the cervical spine and maxillofacial structures were also generated. COMPARISON:  Head CT 08/19/2017.  Maxillofacial CT 02/17/2004. FINDINGS: CT HEAD FINDINGS Brain: Mild generalized cerebral atrophy. Moderate ill-defined hypoattenuation within the cerebral white matter is nonspecific, but compatible with chronic small vessel ischemic disease. There is no acute intracranial hemorrhage. No demarcated cortical infarct. No extra-axial fluid collection. No evidence of intracranial mass. No midline shift. Vascular: No hyperdense vessel.  Atherosclerotic calcifications. Skull: Normal. Negative for fracture or focal lesion. Other: No significant mastoid effusion. CT MAXILLOFACIAL FINDINGS Osseous: No acute maxillofacial fracture is identified. Orbits: The globes are normal in size and contour. The extraocular muscles and optic nerve sheath complexes are symmetric and unremarkable. Sinuses: Minimal ethmoid sinus mucosal thickening. Soft tissues: Right forehead/periorbital hematoma/laceration. CT CERVICAL SPINE FINDINGS Alignment: Trace C3-C4 grade 1 anterolisthesis. 5 mm grade II C4-C5 grade 1 anterolisthesis. Skull base and vertebrae: The basion-dental and atlanto-dental intervals are maintained.No evidence of acute fracture to the cervical spine. Soft tissues and spinal canal: No prevertebral fluid or swelling. No visible canal hematoma. Disc levels: Cervical  spondylosis with multilevel disc space narrowing, disc bulges, uncovertebral hypertrophy and facet arthrosis. Disc space narrowing is severe at C2-C3, C4-C5, C5-C6 and C6-C7. Upper chest: No consolidation within the imaged lung apices. No visible pneumothorax. IMPRESSION: CT head: 1. No evidence of acute intracranial abnormality. 2. Mild cerebral atrophy with moderate chronic small vessel ischemic disease. CT maxillofacial: 1. No evidence of acute maxillofacial fracture. 2. Right forehead/periorbital hematoma/laceration. CT cervical spine: 1. No evidence of acute fracture to the cervical spine. 2. 5 mm grade II C4-C5 grade 1 anterolisthesis. There is significant facet arthropathy at this level and this is likely degenerative in etiology. 3. Mild C3-C4 grade 1 anterolisthesis. 4. Cervical spondylosis as described. Electronically Signed   By: Kellie Simmering DO   On: 02/12/2020 15:30   CT Maxillofacial WO CM  Result Date: 02/12/2020 CLINICAL DATA:  Facial trauma. Neck trauma. Additional history provided: Laceration above right eye and to right ear. EXAM: CT HEAD WITHOUT CONTRAST CT MAXILLOFACIAL WITHOUT CONTRAST CT CERVICAL SPINE WITHOUT CONTRAST TECHNIQUE: Multidetector CT imaging of the head, cervical spine, and maxillofacial structures were performed using the standard protocol without intravenous contrast. Multiplanar CT image reconstructions of the cervical spine and maxillofacial structures were also generated. COMPARISON:  Head CT 08/19/2017.  Maxillofacial CT 02/17/2004. FINDINGS: CT HEAD FINDINGS Brain: Mild generalized cerebral atrophy. Moderate ill-defined hypoattenuation within the cerebral white matter is nonspecific, but compatible with chronic small vessel ischemic disease. There is no acute intracranial hemorrhage. No demarcated cortical infarct. No extra-axial fluid collection. No evidence of intracranial mass. No midline shift. Vascular: No hyperdense vessel.  Atherosclerotic calcifications. Skull:  Normal. Negative for fracture or focal lesion. Other: No significant mastoid effusion. CT MAXILLOFACIAL FINDINGS Osseous: No acute maxillofacial fracture is identified. Orbits: The globes are normal in size and contour. The extraocular muscles and optic nerve sheath complexes are symmetric and unremarkable. Sinuses: Minimal ethmoid sinus mucosal thickening. Soft  tissues: Right forehead/periorbital hematoma/laceration. CT CERVICAL SPINE FINDINGS Alignment: Trace C3-C4 grade 1 anterolisthesis. 5 mm grade II C4-C5 grade 1 anterolisthesis. Skull base and vertebrae: The basion-dental and atlanto-dental intervals are maintained.No evidence of acute fracture to the cervical spine. Soft tissues and spinal canal: No prevertebral fluid or swelling. No visible canal hematoma. Disc levels: Cervical spondylosis with multilevel disc space narrowing, disc bulges, uncovertebral hypertrophy and facet arthrosis. Disc space narrowing is severe at C2-C3, C4-C5, C5-C6 and C6-C7. Upper chest: No consolidation within the imaged lung apices. No visible pneumothorax. IMPRESSION: CT head: 1. No evidence of acute intracranial abnormality. 2. Mild cerebral atrophy with moderate chronic small vessel ischemic disease. CT maxillofacial: 1. No evidence of acute maxillofacial fracture. 2. Right forehead/periorbital hematoma/laceration. CT cervical spine: 1. No evidence of acute fracture to the cervical spine. 2. 5 mm grade II C4-C5 grade 1 anterolisthesis. There is significant facet arthropathy at this level and this is likely degenerative in etiology. 3. Mild C3-C4 grade 1 anterolisthesis. 4. Cervical spondylosis as described. Electronically Signed   By: Kellie Simmering DO   On: 02/12/2020 15:30    Procedures .Marland KitchenLaceration Repair  Date/Time: 02/12/2020 5:30 PM Performed by: Marcello Fennel, PA-C Authorized by: Marcello Fennel, PA-C   Consent:    Consent obtained:  Verbal   Consent given by:  Patient   Risks discussed:  Infection,  pain, retained foreign body, need for additional repair, poor cosmetic result, tendon damage, vascular damage, poor wound healing and nerve damage   Alternatives discussed:  No treatment Anesthesia (see MAR for exact dosages):    Anesthesia method:  None Laceration details:    Location:  Face   Face location:  Forehead   Length (cm):  3   Depth (mm):  1 Repair type:    Repair type:  Simple Pre-procedure details:    Preparation:  Patient was prepped and draped in usual sterile fashion and imaging obtained to evaluate for foreign bodies Exploration:    Wound exploration: wound explored through full range of motion and entire depth of wound probed and visualized     Contaminated: no   Treatment:    Area cleansed with:  Saline   Amount of cleaning:  Standard   Irrigation solution:  Sterile saline   Irrigation method:  Pressure wash   Visualized foreign bodies/material removed: no   Skin repair:    Repair method:  Sutures   Suture size:  4-0   Suture material:  Prolene   Suture technique:  Simple interrupted   Number of sutures:  3 Approximation:    Approximation:  Loose Post-procedure details:    Dressing:  Adhesive bandage   Patient tolerance of procedure:  Tolerated well, no immediate complications   (including critical care time)  Medications Ordered in ED Medications  lidocaine (PF) (XYLOCAINE) 1 % injection 5 mL (has no administration in time range)    ED Course  I have reviewed the triage vital signs and the nursing notes.  Pertinent labs & imaging results that were available during my care of the patient were reviewed by me and considered in my medical decision making (see chart for details).    MDM Rules/Calculators/A&P                          Patient presents after having mechanical fall.  She was alert, did not appear in acute distress, vital signs reassuring.  Will order basic lab work, imaging and reevaluate.  CBC negative for leukocytosis or signs of  anemia.  BMP negative for electrolyte abnormalities, no metabolic acidosis, slight increase in creatinine of 1.45 slightly elevated from baseline, no anion gap noted.  UA negative for nitrates or leukocytes, no hematuria, few bacteria present.  Chest x-ray does not reveal any acute findings.  CT scan of head, maxillofacial, cervical spine does not reveal any acute findings. EKG sinus rhythm without signs of ischemia no ST elevation depression noted.  Due to laceration on her head will require suturing to reduce infection risk.  3 sutures were placed to close the wound, patient tolerated the procedure well.  Low suspicion for CVA or intracranial head bleed as patient denies change in vision, paresthesias or weakness to upper lower extremities, no neuro deficits noted on exam, CT head did not show any acute findings.  Low suspicion for ACS or arrhythmias as patient denies chest pain, shortness of breath, no hypoperfusion or fluid overload on exam, EKG sinus without signs of ischemia.  Low suspicion for systemic infection as patient is nontoxic-appearing, vital signs reassuring, no obvious source infection noted on exam.  I suspect patient had a mechanical fall resulting in a laceration on her head.  Patient received sutures and will recommend she follows up in 5 days for suture removal.  Vital signs have remained stable, no indication for hospital admission.  Patient discussed with attending and they agreed with assessment and plan.  Patient given at home care as well strict return precautions.  Patient verbalized that they understood agreed to said plan.    Final Clinical Impression(s) / ED Diagnoses Final diagnoses:  Fall, initial encounter  Contusion of face, initial encounter    Rx / DC Orders ED Discharge Orders    None       Marcello Fennel, PA-C 02/12/20 [place], DO 02/16/20 2300

## 2020-02-12 NOTE — ED Triage Notes (Signed)
Fall with laceration to R eye and nose bleed with some bruising to bridge of nose R ear noted with slight laceration

## 2020-02-12 NOTE — ED Notes (Signed)
Pt. Has laceration to the upper R eye over the R brow and noted laceration to the R ear with controlled bleeding and inside the R ear.  Pt. Has noted bleeding in the R ear with inability to clean inside the R ear due to each time the R ear is cleaned it begins to bleed.  Pt. Has a skin tear on the R outer knee with controlled bleeding.  Pt. Is in no distress with husband at bedside.

## 2020-02-12 NOTE — ED Triage Notes (Signed)
CBG with EMS was 101

## 2020-02-12 NOTE — Discharge Instructions (Signed)
Seen here after a fall.  Lab work imaging looks reassuring.  I have placed 3 stitches in your head which will have removed within the next 5 days.  Please abstain from washing the wound for the first 24 hours. after that you may run cool water over the area and change the dressings twice a day.  You may take over-the-counter pain medication like ibuprofen or Tylenol if 6 as needed please follow dosing back of bottle.  It Is important that you follow-up at your PCP, urgent care, emergency department to have your sutures removed within the next 5 days.  Come back to the emergency department if you develop chest pain, shortness of breath, severe abdominal pain, uncontrolled nausea, vomiting, diarrhea.

## 2020-02-26 MED FILL — TACROLIMUS 1 MG CAPSULE: 1 | 90 days supply | Qty: 180 | Fill #0

## 2020-02-26 MED FILL — LEVOTHYROXINE SODIUM 50 MCG: 50 | 90 days supply | Qty: 90 | Fill #2

## 2020-02-26 MED FILL — TACROLIMUS ANHYDROUS 0.5MG: 0.5 | 90 days supply | Qty: 180 | Fill #0

## 2020-04-26 MED FILL — ROSUVASTATIN CALCIUM 5 MG T: 5 | 30 days supply | Qty: 30 | Fill #1

## 2020-04-30 ENCOUNTER — Other Ambulatory Visit (HOSPITAL_BASED_OUTPATIENT_CLINIC_OR_DEPARTMENT_OTHER): Payer: Self-pay | Admitting: Internal Medicine

## 2020-04-30 MED FILL — predniSONE 10 MG TABS: 10 | 90 days supply | Qty: 90 | Fill #0

## 2020-04-30 MED FILL — MEMANTINE HCL 10 MG TABS: 10 | 90 days supply | Qty: 180 | Fill #1

## 2020-04-30 MED FILL — SERTRALINE HCL 100 MG TABS: 100 | 90 days supply | Qty: 90 | Fill #1

## 2020-07-13 ENCOUNTER — Other Ambulatory Visit (HOSPITAL_BASED_OUTPATIENT_CLINIC_OR_DEPARTMENT_OTHER): Payer: Self-pay | Admitting: Nephrology

## 2020-07-13 MED FILL — TACROLIMUS 1 MG CAPSULE: 1 | 30 days supply | Qty: 60 | Fill #0

## 2020-07-13 MED FILL — TACROLIMUS ANHYDROUS 0.5MG: 0.5 | 30 days supply | Qty: 60 | Fill #0

## 2020-07-22 ENCOUNTER — Other Ambulatory Visit (HOSPITAL_BASED_OUTPATIENT_CLINIC_OR_DEPARTMENT_OTHER): Payer: Self-pay

## 2020-08-25 ENCOUNTER — Other Ambulatory Visit (HOSPITAL_BASED_OUTPATIENT_CLINIC_OR_DEPARTMENT_OTHER): Payer: Self-pay

## 2020-08-25 MED FILL — Tacrolimus Cap 0.5 MG: ORAL | 30 days supply | Qty: 60 | Fill #0 | Status: AC

## 2020-08-25 MED FILL — Tacrolimus Cap 1 MG: ORAL | 30 days supply | Qty: 60 | Fill #0 | Status: AC

## 2020-08-26 ENCOUNTER — Other Ambulatory Visit (HOSPITAL_BASED_OUTPATIENT_CLINIC_OR_DEPARTMENT_OTHER): Payer: Self-pay

## 2020-09-02 ENCOUNTER — Other Ambulatory Visit (HOSPITAL_BASED_OUTPATIENT_CLINIC_OR_DEPARTMENT_OTHER): Payer: Self-pay

## 2020-09-02 MED ORDER — SERTRALINE HCL 100 MG PO TABS
1.0000 | ORAL_TABLET | Freq: Every day | ORAL | 3 refills | Status: DC
  Filled 2020-09-02: qty 90, 90d supply, fill #0

## 2020-09-02 MED FILL — Prednisone Tab 10 MG: ORAL | 90 days supply | Qty: 90 | Fill #0 | Status: AC

## 2020-09-27 ENCOUNTER — Other Ambulatory Visit (HOSPITAL_BASED_OUTPATIENT_CLINIC_OR_DEPARTMENT_OTHER): Payer: Self-pay

## 2020-09-27 MED FILL — Memantine HCl Tab 10 MG: ORAL | 90 days supply | Qty: 180 | Fill #0 | Status: AC

## 2020-10-11 ENCOUNTER — Other Ambulatory Visit (HOSPITAL_BASED_OUTPATIENT_CLINIC_OR_DEPARTMENT_OTHER): Payer: Self-pay

## 2020-10-11 MED FILL — Tacrolimus Cap 0.5 MG: ORAL | 30 days supply | Qty: 60 | Fill #1 | Status: AC

## 2020-10-11 MED FILL — Tacrolimus Cap 1 MG: ORAL | 30 days supply | Qty: 60 | Fill #1 | Status: AC

## 2020-10-26 DIAGNOSIS — R413 Other amnesia: Secondary | ICD-10-CM | POA: Diagnosis not present

## 2020-10-26 DIAGNOSIS — S81812A Laceration without foreign body, left lower leg, initial encounter: Secondary | ICD-10-CM | POA: Diagnosis not present

## 2020-10-27 ENCOUNTER — Other Ambulatory Visit (HOSPITAL_BASED_OUTPATIENT_CLINIC_OR_DEPARTMENT_OTHER): Payer: Self-pay

## 2020-10-27 DIAGNOSIS — F0280 Dementia in other diseases classified elsewhere without behavioral disturbance: Secondary | ICD-10-CM | POA: Diagnosis not present

## 2020-10-27 DIAGNOSIS — G629 Polyneuropathy, unspecified: Secondary | ICD-10-CM | POA: Diagnosis not present

## 2020-10-27 DIAGNOSIS — E559 Vitamin D deficiency, unspecified: Secondary | ICD-10-CM | POA: Diagnosis not present

## 2020-10-27 DIAGNOSIS — E039 Hypothyroidism, unspecified: Secondary | ICD-10-CM | POA: Diagnosis not present

## 2020-10-27 DIAGNOSIS — N189 Chronic kidney disease, unspecified: Secondary | ICD-10-CM | POA: Diagnosis not present

## 2020-10-27 DIAGNOSIS — I129 Hypertensive chronic kidney disease with stage 1 through stage 4 chronic kidney disease, or unspecified chronic kidney disease: Secondary | ICD-10-CM | POA: Diagnosis not present

## 2020-10-27 DIAGNOSIS — M810 Age-related osteoporosis without current pathological fracture: Secondary | ICD-10-CM | POA: Diagnosis not present

## 2020-10-28 DIAGNOSIS — F0280 Dementia in other diseases classified elsewhere without behavioral disturbance: Secondary | ICD-10-CM | POA: Diagnosis not present

## 2020-10-28 DIAGNOSIS — F320 Major depressive disorder, single episode, mild: Secondary | ICD-10-CM | POA: Diagnosis not present

## 2020-10-28 DIAGNOSIS — E440 Moderate protein-calorie malnutrition: Secondary | ICD-10-CM | POA: Diagnosis not present

## 2020-10-28 DIAGNOSIS — M810 Age-related osteoporosis without current pathological fracture: Secondary | ICD-10-CM | POA: Diagnosis not present

## 2020-10-28 DIAGNOSIS — Z940 Kidney transplant status: Secondary | ICD-10-CM | POA: Diagnosis not present

## 2020-10-28 DIAGNOSIS — I959 Hypotension, unspecified: Secondary | ICD-10-CM | POA: Diagnosis not present

## 2020-10-28 DIAGNOSIS — E039 Hypothyroidism, unspecified: Secondary | ICD-10-CM | POA: Diagnosis not present

## 2020-10-28 DIAGNOSIS — G301 Alzheimer's disease with late onset: Secondary | ICD-10-CM | POA: Diagnosis not present

## 2020-10-29 DIAGNOSIS — D692 Other nonthrombocytopenic purpura: Secondary | ICD-10-CM | POA: Diagnosis not present

## 2020-10-29 DIAGNOSIS — S81809D Unspecified open wound, unspecified lower leg, subsequent encounter: Secondary | ICD-10-CM | POA: Diagnosis not present

## 2020-10-29 DIAGNOSIS — L570 Actinic keratosis: Secondary | ICD-10-CM | POA: Diagnosis not present

## 2020-10-29 DIAGNOSIS — Z85828 Personal history of other malignant neoplasm of skin: Secondary | ICD-10-CM | POA: Diagnosis not present

## 2020-10-29 DIAGNOSIS — L814 Other melanin hyperpigmentation: Secondary | ICD-10-CM | POA: Diagnosis not present

## 2020-11-10 ENCOUNTER — Emergency Department (HOSPITAL_COMMUNITY): Payer: Medicare PPO

## 2020-11-10 ENCOUNTER — Emergency Department (HOSPITAL_COMMUNITY)
Admission: EM | Admit: 2020-11-10 | Discharge: 2020-11-11 | Disposition: A | Discharge status: Home / Self Care | Payer: Medicare PPO | Attending: Emergency Medicine | Admitting: Emergency Medicine

## 2020-11-10 ENCOUNTER — Other Ambulatory Visit: Payer: Self-pay

## 2020-11-10 ENCOUNTER — Encounter (HOSPITAL_COMMUNITY): Payer: Self-pay

## 2020-11-10 DIAGNOSIS — M545 Low back pain, unspecified: Secondary | ICD-10-CM | POA: Diagnosis not present

## 2020-11-10 DIAGNOSIS — F0390 Unspecified dementia without behavioral disturbance: Secondary | ICD-10-CM | POA: Diagnosis not present

## 2020-11-10 DIAGNOSIS — M546 Pain in thoracic spine: Secondary | ICD-10-CM | POA: Diagnosis not present

## 2020-11-10 DIAGNOSIS — M4316 Spondylolisthesis, lumbar region: Secondary | ICD-10-CM | POA: Diagnosis not present

## 2020-11-10 DIAGNOSIS — W19XXXA Unspecified fall, initial encounter: Secondary | ICD-10-CM

## 2020-11-10 DIAGNOSIS — M549 Dorsalgia, unspecified: Secondary | ICD-10-CM | POA: Diagnosis not present

## 2020-11-10 DIAGNOSIS — Y9389 Activity, other specified: Secondary | ICD-10-CM | POA: Diagnosis not present

## 2020-11-10 DIAGNOSIS — R202 Paresthesia of skin: Secondary | ICD-10-CM | POA: Diagnosis not present

## 2020-11-10 DIAGNOSIS — Y92129 Unspecified place in nursing home as the place of occurrence of the external cause: Secondary | ICD-10-CM | POA: Insufficient documentation

## 2020-11-10 DIAGNOSIS — Z79899 Other long term (current) drug therapy: Secondary | ICD-10-CM | POA: Insufficient documentation

## 2020-11-10 DIAGNOSIS — W010XXA Fall on same level from slipping, tripping and stumbling without subsequent striking against object, initial encounter: Secondary | ICD-10-CM | POA: Diagnosis not present

## 2020-11-10 DIAGNOSIS — Z853 Personal history of malignant neoplasm of breast: Secondary | ICD-10-CM | POA: Diagnosis not present

## 2020-11-10 DIAGNOSIS — Z043 Encounter for examination and observation following other accident: Secondary | ICD-10-CM | POA: Diagnosis not present

## 2020-11-10 DIAGNOSIS — Z96652 Presence of left artificial knee joint: Secondary | ICD-10-CM | POA: Diagnosis not present

## 2020-11-10 DIAGNOSIS — Z743 Need for continuous supervision: Secondary | ICD-10-CM | POA: Diagnosis not present

## 2020-11-10 DIAGNOSIS — M533 Sacrococcygeal disorders, not elsewhere classified: Secondary | ICD-10-CM | POA: Diagnosis not present

## 2020-11-10 DIAGNOSIS — I10 Essential (primary) hypertension: Secondary | ICD-10-CM | POA: Diagnosis not present

## 2020-11-10 DIAGNOSIS — M47816 Spondylosis without myelopathy or radiculopathy, lumbar region: Secondary | ICD-10-CM | POA: Diagnosis not present

## 2020-11-10 DIAGNOSIS — G8929 Other chronic pain: Secondary | ICD-10-CM | POA: Diagnosis not present

## 2020-11-10 DIAGNOSIS — E039 Hypothyroidism, unspecified: Secondary | ICD-10-CM | POA: Insufficient documentation

## 2020-11-10 MED ORDER — LIDOCAINE 5 % EX PTCH
1.0000 | MEDICATED_PATCH | CUTANEOUS | Status: DC
  Administered 2020-11-10: 1 via TRANSDERMAL
  Filled 2020-11-10: qty 1

## 2020-11-10 MED ORDER — LIDOCAINE 5 % EX PTCH: 1.0000 | MEDICATED_PATCH | CUTANEOUS | 0 refills | Status: DC

## 2020-11-10 NOTE — Discharge Instructions (Addendum)
Use Lidoderm patches as needed as prescribed.  Recheck with your primary care provider.

## 2020-11-10 NOTE — ED Provider Notes (Signed)
[facility] DEPT Provider Note   CSN: [id number] Arrival date & time: 11/10/20  1542     History Chief Complaint  Patient presents with   [name] is a 87 y.o. female.  87 year old female with history of memory loss, lives in a memory care facility, brought in by EMS with complaint of back pain for several years, intermittent, diffuse lower back, currently in PT at [facility] where she [facility] but feels it is not helping. Reports at least 2 falls in the past month. Per traige note, fell getting out of bed today but patient states this happened in the past month and not today. Pain described as sharp and achy with tingling in the area, occasionally wakes her from sleep. Denies feeling dizzy prior to her falls, denies CP or SHOB.       Past Medical History:  Diagnosis Date   Arthritis    Cancer ([place]) 1988   BREAST CANCER- MASTECTOMY RIGHT - NO CHEMO NO RADIATION   CMV (cytomegalovirus infection) ([place])    Depression with anxiety    Dizziness    Gout    History of breast cancer    Hypertension    Hypothyroidism    Iron deficiency anemia    Memory loss    Osteoarthritis    Osteopenia    Patella fracture    Peripheral neuropathy    Raynauds phenomenon    Renal disorder    HX OF KIDNEY TRANSPLANT - STATES KIDNEY FUNCTION OK -DR. SANDFORD / DR. FOX -   [facility]   Sinus problem    HX OF SINUS INFECTIONS    Thyroid disease    Vitamin D deficiency    Wolff-Parkinson-White (WPW) syndrome     Patient Active Problem List   Diagnosis Date Noted   Closed pelvic ring fracture ([place]) 06/11/2018   Memory loss 06/10/2018   Gait abnormality 06/10/2018   Lumbar foraminal stenosis 03/27/2018   Facet arthropathy, lumbar 12/19/2017   Stenosis of lateral recess of lumbar spine 12/19/2017   Syncope 10/03/2017   History of breast cancer 10/03/2017   Back pain 10/03/2017   WPW (Wolff-Parkinson-White syndrome) 10/03/2017    Polyneuropathy associated with underlying disease ([place]) 06/06/2017   Mild cognitive impairment 04/23/2017   Dizziness 04/23/2017   Expected blood loss anemia 07/23/2013   S/P Right patella ORIF 07/22/2013   Renal transplant disorder 06/07/2012   Unspecified hypothyroidism 06/07/2012   Infection, 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     Family History  Problem Relation Age of Onset   Other Mother        unsure of history    Dementia Father    Dementia Sister    Cancer Brother        lung    Social History   Tobacco Use  Smoking status: Never   Smokeless tobacco: Never  Vaping Use   Vaping Use: Never used  Substance Use Topics   Alcohol use: Yes    Comment: MAYBE ONE GLASS OF WINE EVERY FEW MONTHS   Drug use: No    Home Medications Prior to Admission medications   Medication Sig Start Date End Date Taking? Authorizing Provider  lidocaine (LIDODERM) 5 % Place 1 patch onto the skin daily. Remove & Discard patch within 12 hours or as directed by MD 11/10/20  Yes Tacy Learn, PA-C  acetaminophen (TYLENOL) 325 MG tablet Take 325 mg by mouth as needed (pain).     [provider]  alendronate (FOSAMAX) 70 MG tablet Take 70 mg by mouth every 7 (seven) days.    [provider]  Calcium Carbonate (CALCIUM 600 PO) Take 1 tablet by mouth 2 (two) times daily.    [provider]  cholecalciferol (VITAMIN D) 1000  UNITS tablet Take 2,000 Units by mouth daily.     [provider]  denosumab (PROLIA) 60 MG/ML SOLN injection Inject 60 mg into the skin every 6 (six) months. Administer in upper arm, thigh, or abdomen    [provider]  levothyroxine (SYNTHROID) 50 MCG tablet TAKE 1 TABLET BY MOUTH ONCE DAILY 06/30/19 06/29/20  Deland Pretty, MD  levothyroxine (SYNTHROID, LEVOTHROID) 50 MCG tablet Take 50 mcg by mouth daily.    [provider]  memantine (NAMENDA) 10 MG tablet Take 1 tablet (10 mg total) by mouth 2 (two) times daily. Please request future refills from PCP. 07/14/19   Marcial Pacas, MD  memantine (NAMENDA) 10 MG tablet TAKE 1 TABLET BY MOUTH TWICE DAILY 01/05/20 01/04/21  Deland Pretty, MD  mirtazapine (REMERON) 7.5 MG tablet  08/28/17   [provider]  oxycodone (OXY-IR) 5 MG capsule Take 5 mg by mouth 2 (two) times daily as needed.    [provider]  predniSONE (DELTASONE) 10 MG tablet TAKE 1 TABLET BY MOUTH ONCE DAILY 04/30/20 04/30/21  Deland Pretty, MD  predniSONE (DELTASONE) 5 MG tablet Take 10 mg by mouth daily.     [provider]  rosuvastatin (CRESTOR) 5 MG tablet TAKE 1 TABLET BY MOUTH ONCE DAILY 10/02/19 10/01/20  Deland Pretty, MD  sertraline (ZOLOFT) 100 MG tablet TAKE 1 TABLET BY MOUTH ONCE DAILY 09/02/20     sertraline (ZOLOFT) 50 MG tablet Take 2 tablets (100 mg total) by mouth daily. 06/10/18   Marcial Pacas, MD  tacrolimus (PROGRAF) 0.5 MG capsule TAKE 1 (ONE) CAPSULE BY MOUTH TWO TIMES DAILY 07/13/20 07/13/21  Rexene Agent, MD  tacrolimus (PROGRAF) 1 MG capsule Take 2 mg by mouth 2 (two) times daily.     [provider]  tacrolimus (PROGRAF) 1 MG capsule TAKE 1 (ONE) CAPSULE BY MOUTH TWO TIMES DAILY 07/13/20 07/13/21  Rexene Agent, MD    Allergies    Cephalexin, Augmentin [amoxicillin-pot clavulanate], Doxycycline, and Minocycline  Review of Systems   Review of Systems  Unable to perform ROS: Dementia   Physical  Exam Updated Vital Signs BP (!) 142/83 (BP Location: Right Arm)   Pulse 77   Temp 98.3 F (36.8 C) (Oral)   Resp 16   Ht 5\' 7"  (1.702 m)   Wt 50 kg   SpO2 99%   BMI 17.26 kg/m   Physical Exam HENT:     Mouth/Throat:     Mouth: Mucous membranes are moist.  Eyes:     Extraocular Movements:  Extraocular movements intact.     Conjunctiva/sclera: Conjunctivae normal.     Pupils: Pupils are equal, round, and reactive to light.  Cardiovascular:     Rate and Rhythm: Normal rate and regular rhythm.     Heart sounds: Normal heart sounds.  Pulmonary:     Effort: Pulmonary effort is normal.     Breath sounds: Normal breath sounds.  Abdominal:     Palpations: Abdomen is soft.     Tenderness: There is no abdominal tenderness.  Musculoskeletal:        General: Tenderness present. No swelling or deformity.     Cervical back: Normal range of motion and neck supple. No tenderness or bony tenderness.     Thoracic back: No tenderness or bony tenderness.     Lumbar back: Tenderness present. No bony tenderness. Negative right straight leg raise test and negative left straight leg raise test.       Back:     Right lower leg: No edema.     Left lower leg: No edema.  Skin:    General: Skin is warm and dry.     Findings: No erythema or rash.  Neurological:     General: No focal deficit present.  Psychiatric:        Behavior: Behavior normal.    ED Results / Procedures / Treatments   Labs (all labs ordered are listed, but only abnormal results are displayed) Labs Reviewed - No data to display  EKG None  Radiology DG Lumbar Spine Complete  Result Date: 11/10/2020 CLINICAL DATA:  Fall EXAM: LUMBAR SPINE - COMPLETE 4+ VIEW COMPARISON:  Radiograph 10/02/2017 FINDINGS: There is no evidence of lumbar spine fracture. There is grade 1 anterolisthesis at L4-L5, unchanged. There is mild multilevel degenerative disc changes and lower lumbar predominant facet arthritis, unchanged. Left SI joint  subchondral sclerosis, likely degenerative. Left upper quadrant vascular calcifications. Aortic calcifications. IMPRESSION: No evidence of lumbar spine fracture. Mild multilevel degenerative disc disease and facet arthropathy. Unchanged grade 1 anterolisthesis at L4-L5. Electronically Signed   By: Maurine Simmering   On: 11/10/2020 17:26   DG Hip Unilat With Pelvis 2-3 Views Right  Result Date: 11/10/2020 CLINICAL DATA:  87 year old female with fall. EXAM: DG HIP (WITH OR WITHOUT PELVIS) 2-3V RIGHT COMPARISON:  Pelvic radiograph dated 08/03/2017. FINDINGS: There is no acute fracture or dislocation. The bones are osteopenic. Old healed fracture deformity of the left pubic bone. Bilateral SI joint degenerative changes. The soft tissues are unremarkable. Vascular calcifications noted. IMPRESSION: No acute fracture or dislocation. Electronically Signed   By: Anner Crete M.D.   On: 11/10/2020 17:26    Procedures Procedures   Medications Ordered in ED Medications  lidocaine (LIDODERM) 5 % 1 patch (1 patch Transdermal Patch Applied 11/10/20 2049)    ED Course  I have reviewed the triage vital signs and the nursing notes.  Pertinent labs & imaging results that were available during my care of the patient were reviewed by me and considered in my medical decision making (see chart for details).  Clinical Course as of 11/10/20 2104  Wed Jul 27, 657  3810 87 year old female brought in by EMS from memory care facility after report of low back pain after falling getting out of bed today.  Patient reports chronic back pain without recent fall.  On exam, she is found to have mild left and right paralumbar spinous tenderness, no midline or bony tenderness.  No pain with range of motion of her hips,  straight leg raise negative, equal strength.  X-rays ordered from triage including lumbar spine and pelvis without acute injury.  Patient was given a Lidoderm patch and discharged back to facility with prescription for  same. [LM]    Clinical Course User Index [LM] Roque Lias   MDM Rules/Calculators/A&P                           Final Clinical Impression(s) / ED Diagnoses Final diagnoses:  Fall, initial encounter  Chronic bilateral low back pain without sciatica    Rx / DC Orders ED Discharge Orders          Ordered    lidocaine (LIDODERM) 5 %  Every 24 hours        11/10/20 2042             Tacy Learn, PA-C 11/10/20 2104    Regan Lemming, MD 11/10/20 2318

## 2020-11-10 NOTE — ED Provider Notes (Signed)
Emergency Medicine Provider Triage Evaluation Note  [name] , a 87 y.o. female  was evaluated in triage.  Pt complains of fall getting out of bed.  Patient is at a memory care unit, history of dementia.  EMS states that she did not hit her head, primarily hit her flank on the right side.  Patient does have ecchymosis on the right side of her hip.  Patient is normally ambulatory with assistance.  Is not on any blood thinners.  Review of Systems  Positive: Fall Negative:   Physical Exam  BP 117/66 (BP Location: Right Arm)   Pulse 83   Temp 98.3 F (36.8 C) (Oral)   Resp 18   SpO2 100%  Gen:   Awake, no distress   Resp:  Normal effort  MSK:   Patient does have some ecchymosis to right hip, normal range of motion to hip.  Some midline tenderness to lumbar spine as well.  Patient has old ecchymosis to left thigh from previous fall. Other:  Neuro exam is at baseline  Medical Decision Making  Medically screening exam initiated at 4:13 PM.  Appropriate orders placed.  Missy Sabins was informed that the remainder of the evaluation will be completed by another provider, this initial triage assessment does not replace that evaluation, and the importance of remaining in the ED until their evaluation is complete.     Alfredia Client, PA-C 11/10/20 1617    Gareth Morgan, MD 11/11/20 1055

## 2020-11-10 NOTE — ED Notes (Signed)
[facility] called for pt transport back to [facility] at [place]. Pt given sandwich and juice while she waits.

## 2020-11-10 NOTE — ED Notes (Signed)
ED Provider at bedside. 

## 2020-11-10 NOTE — ED Triage Notes (Signed)
Pt c/o left flank pain after falling- pt states she lost balance and fell when getting out of bed. Pt has bruise on left eye- from previous fall. Hx of dementia, alert to self

## 2020-11-10 NOTE — ED Notes (Signed)
Called pt x3 to update vital

## 2020-11-11 DIAGNOSIS — R404 Transient alteration of awareness: Secondary | ICD-10-CM | POA: Diagnosis not present

## 2020-11-11 DIAGNOSIS — R5381 Other malaise: Secondary | ICD-10-CM | POA: Diagnosis not present

## 2020-11-11 DIAGNOSIS — Z743 Need for continuous supervision: Secondary | ICD-10-CM | POA: Diagnosis not present

## 2020-11-11 NOTE — ED Notes (Signed)
[facility] here to pick up pt.. 

## 2020-11-12 DIAGNOSIS — M6281 Muscle weakness (generalized): Secondary | ICD-10-CM | POA: Diagnosis not present

## 2020-11-12 DIAGNOSIS — G629 Polyneuropathy, unspecified: Secondary | ICD-10-CM | POA: Diagnosis not present

## 2020-11-12 DIAGNOSIS — R2681 Unsteadiness on feet: Secondary | ICD-10-CM | POA: Diagnosis not present

## 2020-11-12 DIAGNOSIS — R41841 Cognitive communication deficit: Secondary | ICD-10-CM | POA: Diagnosis not present

## 2020-11-12 DIAGNOSIS — R278 Other lack of coordination: Secondary | ICD-10-CM | POA: Diagnosis not present

## 2020-11-12 DIAGNOSIS — Z9181 History of falling: Secondary | ICD-10-CM | POA: Diagnosis not present

## 2020-11-12 DIAGNOSIS — F0280 Dementia in other diseases classified elsewhere without behavioral disturbance: Secondary | ICD-10-CM | POA: Diagnosis not present

## 2020-11-12 DIAGNOSIS — M810 Age-related osteoporosis without current pathological fracture: Secondary | ICD-10-CM | POA: Diagnosis not present

## 2020-11-12 DIAGNOSIS — F418 Other specified anxiety disorders: Secondary | ICD-10-CM | POA: Diagnosis not present

## 2020-11-15 DIAGNOSIS — Z9181 History of falling: Secondary | ICD-10-CM | POA: Diagnosis not present

## 2020-11-15 DIAGNOSIS — G629 Polyneuropathy, unspecified: Secondary | ICD-10-CM | POA: Diagnosis not present

## 2020-11-15 DIAGNOSIS — M6281 Muscle weakness (generalized): Secondary | ICD-10-CM | POA: Diagnosis not present

## 2020-11-15 DIAGNOSIS — R2681 Unsteadiness on feet: Secondary | ICD-10-CM | POA: Diagnosis not present

## 2020-11-15 DIAGNOSIS — F418 Other specified anxiety disorders: Secondary | ICD-10-CM | POA: Diagnosis not present

## 2020-11-15 DIAGNOSIS — M810 Age-related osteoporosis without current pathological fracture: Secondary | ICD-10-CM | POA: Diagnosis not present

## 2020-11-15 DIAGNOSIS — R41841 Cognitive communication deficit: Secondary | ICD-10-CM | POA: Diagnosis not present

## 2020-11-15 DIAGNOSIS — F0280 Dementia in other diseases classified elsewhere without behavioral disturbance: Secondary | ICD-10-CM | POA: Diagnosis not present

## 2020-11-15 DIAGNOSIS — R278 Other lack of coordination: Secondary | ICD-10-CM | POA: Diagnosis not present

## 2020-11-16 DIAGNOSIS — R41841 Cognitive communication deficit: Secondary | ICD-10-CM | POA: Diagnosis not present

## 2020-11-16 DIAGNOSIS — M6281 Muscle weakness (generalized): Secondary | ICD-10-CM | POA: Diagnosis not present

## 2020-11-16 DIAGNOSIS — R278 Other lack of coordination: Secondary | ICD-10-CM | POA: Diagnosis not present

## 2020-11-16 DIAGNOSIS — R2681 Unsteadiness on feet: Secondary | ICD-10-CM | POA: Diagnosis not present

## 2020-11-16 DIAGNOSIS — M810 Age-related osteoporosis without current pathological fracture: Secondary | ICD-10-CM | POA: Diagnosis not present

## 2020-11-16 DIAGNOSIS — G629 Polyneuropathy, unspecified: Secondary | ICD-10-CM | POA: Diagnosis not present

## 2020-11-16 DIAGNOSIS — F418 Other specified anxiety disorders: Secondary | ICD-10-CM | POA: Diagnosis not present

## 2020-11-16 DIAGNOSIS — Z9181 History of falling: Secondary | ICD-10-CM | POA: Diagnosis not present

## 2020-11-16 DIAGNOSIS — F0280 Dementia in other diseases classified elsewhere without behavioral disturbance: Secondary | ICD-10-CM | POA: Diagnosis not present

## 2020-11-17 DIAGNOSIS — R41841 Cognitive communication deficit: Secondary | ICD-10-CM | POA: Diagnosis not present

## 2020-11-17 DIAGNOSIS — F418 Other specified anxiety disorders: Secondary | ICD-10-CM | POA: Diagnosis not present

## 2020-11-17 DIAGNOSIS — G629 Polyneuropathy, unspecified: Secondary | ICD-10-CM | POA: Diagnosis not present

## 2020-11-17 DIAGNOSIS — M810 Age-related osteoporosis without current pathological fracture: Secondary | ICD-10-CM | POA: Diagnosis not present

## 2020-11-17 DIAGNOSIS — M6281 Muscle weakness (generalized): Secondary | ICD-10-CM | POA: Diagnosis not present

## 2020-11-17 DIAGNOSIS — R2681 Unsteadiness on feet: Secondary | ICD-10-CM | POA: Diagnosis not present

## 2020-11-17 DIAGNOSIS — F0280 Dementia in other diseases classified elsewhere without behavioral disturbance: Secondary | ICD-10-CM | POA: Diagnosis not present

## 2020-11-17 DIAGNOSIS — R278 Other lack of coordination: Secondary | ICD-10-CM | POA: Diagnosis not present

## 2020-11-17 DIAGNOSIS — Z9181 History of falling: Secondary | ICD-10-CM | POA: Diagnosis not present

## 2020-11-18 DIAGNOSIS — Z9181 History of falling: Secondary | ICD-10-CM | POA: Diagnosis not present

## 2020-11-18 DIAGNOSIS — R2681 Unsteadiness on feet: Secondary | ICD-10-CM | POA: Diagnosis not present

## 2020-11-18 DIAGNOSIS — M6281 Muscle weakness (generalized): Secondary | ICD-10-CM | POA: Diagnosis not present

## 2020-11-18 DIAGNOSIS — M810 Age-related osteoporosis without current pathological fracture: Secondary | ICD-10-CM | POA: Diagnosis not present

## 2020-11-18 DIAGNOSIS — R278 Other lack of coordination: Secondary | ICD-10-CM | POA: Diagnosis not present

## 2020-11-18 DIAGNOSIS — F0280 Dementia in other diseases classified elsewhere without behavioral disturbance: Secondary | ICD-10-CM | POA: Diagnosis not present

## 2020-11-18 DIAGNOSIS — F418 Other specified anxiety disorders: Secondary | ICD-10-CM | POA: Diagnosis not present

## 2020-11-18 DIAGNOSIS — R41841 Cognitive communication deficit: Secondary | ICD-10-CM | POA: Diagnosis not present

## 2020-11-18 DIAGNOSIS — G629 Polyneuropathy, unspecified: Secondary | ICD-10-CM | POA: Diagnosis not present

## 2020-11-19 DIAGNOSIS — F418 Other specified anxiety disorders: Secondary | ICD-10-CM | POA: Diagnosis not present

## 2020-11-19 DIAGNOSIS — R278 Other lack of coordination: Secondary | ICD-10-CM | POA: Diagnosis not present

## 2020-11-19 DIAGNOSIS — M6281 Muscle weakness (generalized): Secondary | ICD-10-CM | POA: Diagnosis not present

## 2020-11-19 DIAGNOSIS — G629 Polyneuropathy, unspecified: Secondary | ICD-10-CM | POA: Diagnosis not present

## 2020-11-19 DIAGNOSIS — R2681 Unsteadiness on feet: Secondary | ICD-10-CM | POA: Diagnosis not present

## 2020-11-19 DIAGNOSIS — Z9181 History of falling: Secondary | ICD-10-CM | POA: Diagnosis not present

## 2020-11-19 DIAGNOSIS — R41841 Cognitive communication deficit: Secondary | ICD-10-CM | POA: Diagnosis not present

## 2020-11-19 DIAGNOSIS — F0280 Dementia in other diseases classified elsewhere without behavioral disturbance: Secondary | ICD-10-CM | POA: Diagnosis not present

## 2020-11-19 DIAGNOSIS — M810 Age-related osteoporosis without current pathological fracture: Secondary | ICD-10-CM | POA: Diagnosis not present

## 2020-11-22 DIAGNOSIS — M6281 Muscle weakness (generalized): Secondary | ICD-10-CM | POA: Diagnosis not present

## 2020-11-22 DIAGNOSIS — R41841 Cognitive communication deficit: Secondary | ICD-10-CM | POA: Diagnosis not present

## 2020-11-22 DIAGNOSIS — Z9181 History of falling: Secondary | ICD-10-CM | POA: Diagnosis not present

## 2020-11-22 DIAGNOSIS — R278 Other lack of coordination: Secondary | ICD-10-CM | POA: Diagnosis not present

## 2020-11-22 DIAGNOSIS — F0280 Dementia in other diseases classified elsewhere without behavioral disturbance: Secondary | ICD-10-CM | POA: Diagnosis not present

## 2020-11-22 DIAGNOSIS — F418 Other specified anxiety disorders: Secondary | ICD-10-CM | POA: Diagnosis not present

## 2020-11-22 DIAGNOSIS — R2681 Unsteadiness on feet: Secondary | ICD-10-CM | POA: Diagnosis not present

## 2020-11-22 DIAGNOSIS — M810 Age-related osteoporosis without current pathological fracture: Secondary | ICD-10-CM | POA: Diagnosis not present

## 2020-11-22 DIAGNOSIS — G629 Polyneuropathy, unspecified: Secondary | ICD-10-CM | POA: Diagnosis not present

## 2020-11-23 DIAGNOSIS — R2681 Unsteadiness on feet: Secondary | ICD-10-CM | POA: Diagnosis not present

## 2020-11-23 DIAGNOSIS — R41841 Cognitive communication deficit: Secondary | ICD-10-CM | POA: Diagnosis not present

## 2020-11-23 DIAGNOSIS — Z940 Kidney transplant status: Secondary | ICD-10-CM | POA: Diagnosis not present

## 2020-11-23 DIAGNOSIS — G629 Polyneuropathy, unspecified: Secondary | ICD-10-CM | POA: Diagnosis not present

## 2020-11-23 DIAGNOSIS — M6281 Muscle weakness (generalized): Secondary | ICD-10-CM | POA: Diagnosis not present

## 2020-11-23 DIAGNOSIS — R278 Other lack of coordination: Secondary | ICD-10-CM | POA: Diagnosis not present

## 2020-11-23 DIAGNOSIS — Z9181 History of falling: Secondary | ICD-10-CM | POA: Diagnosis not present

## 2020-11-23 DIAGNOSIS — F418 Other specified anxiety disorders: Secondary | ICD-10-CM | POA: Diagnosis not present

## 2020-11-23 DIAGNOSIS — F0280 Dementia in other diseases classified elsewhere without behavioral disturbance: Secondary | ICD-10-CM | POA: Diagnosis not present

## 2020-11-23 DIAGNOSIS — M810 Age-related osteoporosis without current pathological fracture: Secondary | ICD-10-CM | POA: Diagnosis not present

## 2020-11-23 DIAGNOSIS — Z0000 Encounter for general adult medical examination without abnormal findings: Secondary | ICD-10-CM | POA: Diagnosis not present

## 2020-11-24 DIAGNOSIS — G629 Polyneuropathy, unspecified: Secondary | ICD-10-CM | POA: Diagnosis not present

## 2020-11-24 DIAGNOSIS — M810 Age-related osteoporosis without current pathological fracture: Secondary | ICD-10-CM | POA: Diagnosis not present

## 2020-11-24 DIAGNOSIS — Z940 Kidney transplant status: Secondary | ICD-10-CM | POA: Diagnosis not present

## 2020-11-24 DIAGNOSIS — Z9181 History of falling: Secondary | ICD-10-CM | POA: Diagnosis not present

## 2020-11-24 DIAGNOSIS — R41841 Cognitive communication deficit: Secondary | ICD-10-CM | POA: Diagnosis not present

## 2020-11-24 DIAGNOSIS — R278 Other lack of coordination: Secondary | ICD-10-CM | POA: Diagnosis not present

## 2020-11-24 DIAGNOSIS — F0280 Dementia in other diseases classified elsewhere without behavioral disturbance: Secondary | ICD-10-CM | POA: Diagnosis not present

## 2020-11-24 DIAGNOSIS — F418 Other specified anxiety disorders: Secondary | ICD-10-CM | POA: Diagnosis not present

## 2020-11-24 DIAGNOSIS — R2681 Unsteadiness on feet: Secondary | ICD-10-CM | POA: Diagnosis not present

## 2020-11-24 DIAGNOSIS — M6281 Muscle weakness (generalized): Secondary | ICD-10-CM | POA: Diagnosis not present

## 2020-11-25 DIAGNOSIS — F329 Major depressive disorder, single episode, unspecified: Secondary | ICD-10-CM | POA: Diagnosis not present

## 2020-11-25 DIAGNOSIS — F0391 Unspecified dementia with behavioral disturbance: Secondary | ICD-10-CM | POA: Diagnosis not present

## 2020-11-25 DIAGNOSIS — E7800 Pure hypercholesterolemia, unspecified: Secondary | ICD-10-CM | POA: Diagnosis not present

## 2020-11-25 DIAGNOSIS — R41841 Cognitive communication deficit: Secondary | ICD-10-CM | POA: Diagnosis not present

## 2020-11-25 DIAGNOSIS — R2681 Unsteadiness on feet: Secondary | ICD-10-CM | POA: Diagnosis not present

## 2020-11-25 DIAGNOSIS — Z9181 History of falling: Secondary | ICD-10-CM | POA: Diagnosis not present

## 2020-11-25 DIAGNOSIS — M810 Age-related osteoporosis without current pathological fracture: Secondary | ICD-10-CM | POA: Diagnosis not present

## 2020-11-25 DIAGNOSIS — G629 Polyneuropathy, unspecified: Secondary | ICD-10-CM | POA: Diagnosis not present

## 2020-11-25 DIAGNOSIS — I456 Pre-excitation syndrome: Secondary | ICD-10-CM | POA: Diagnosis not present

## 2020-11-25 DIAGNOSIS — F0280 Dementia in other diseases classified elsewhere without behavioral disturbance: Secondary | ICD-10-CM | POA: Diagnosis not present

## 2020-11-25 DIAGNOSIS — E559 Vitamin D deficiency, unspecified: Secondary | ICD-10-CM | POA: Diagnosis not present

## 2020-11-25 DIAGNOSIS — F418 Other specified anxiety disorders: Secondary | ICD-10-CM | POA: Diagnosis not present

## 2020-11-25 DIAGNOSIS — Z0000 Encounter for general adult medical examination without abnormal findings: Secondary | ICD-10-CM | POA: Diagnosis not present

## 2020-11-25 DIAGNOSIS — E039 Hypothyroidism, unspecified: Secondary | ICD-10-CM | POA: Diagnosis not present

## 2020-11-25 DIAGNOSIS — R278 Other lack of coordination: Secondary | ICD-10-CM | POA: Diagnosis not present

## 2020-11-25 DIAGNOSIS — M6281 Muscle weakness (generalized): Secondary | ICD-10-CM | POA: Diagnosis not present

## 2020-11-25 DIAGNOSIS — Z940 Kidney transplant status: Secondary | ICD-10-CM | POA: Diagnosis not present

## 2020-11-26 DIAGNOSIS — I959 Hypotension, unspecified: Secondary | ICD-10-CM | POA: Diagnosis not present

## 2020-11-26 DIAGNOSIS — Z940 Kidney transplant status: Secondary | ICD-10-CM | POA: Diagnosis not present

## 2020-11-26 DIAGNOSIS — F0390 Unspecified dementia without behavioral disturbance: Secondary | ICD-10-CM | POA: Diagnosis not present

## 2020-11-26 DIAGNOSIS — E039 Hypothyroidism, unspecified: Secondary | ICD-10-CM | POA: Diagnosis not present

## 2020-11-26 DIAGNOSIS — F32A Depression, unspecified: Secondary | ICD-10-CM | POA: Diagnosis not present

## 2020-11-26 DIAGNOSIS — Z8679 Personal history of other diseases of the circulatory system: Secondary | ICD-10-CM | POA: Diagnosis not present

## 2020-11-26 DIAGNOSIS — M810 Age-related osteoporosis without current pathological fracture: Secondary | ICD-10-CM | POA: Diagnosis not present

## 2020-11-29 DIAGNOSIS — R2681 Unsteadiness on feet: Secondary | ICD-10-CM | POA: Diagnosis not present

## 2020-11-29 DIAGNOSIS — R278 Other lack of coordination: Secondary | ICD-10-CM | POA: Diagnosis not present

## 2020-11-29 DIAGNOSIS — G629 Polyneuropathy, unspecified: Secondary | ICD-10-CM | POA: Diagnosis not present

## 2020-11-29 DIAGNOSIS — M810 Age-related osteoporosis without current pathological fracture: Secondary | ICD-10-CM | POA: Diagnosis not present

## 2020-11-29 DIAGNOSIS — F418 Other specified anxiety disorders: Secondary | ICD-10-CM | POA: Diagnosis not present

## 2020-11-29 DIAGNOSIS — M6281 Muscle weakness (generalized): Secondary | ICD-10-CM | POA: Diagnosis not present

## 2020-11-29 DIAGNOSIS — F0280 Dementia in other diseases classified elsewhere without behavioral disturbance: Secondary | ICD-10-CM | POA: Diagnosis not present

## 2020-11-29 DIAGNOSIS — Z9181 History of falling: Secondary | ICD-10-CM | POA: Diagnosis not present

## 2020-11-29 DIAGNOSIS — R41841 Cognitive communication deficit: Secondary | ICD-10-CM | POA: Diagnosis not present

## 2020-11-30 DIAGNOSIS — M810 Age-related osteoporosis without current pathological fracture: Secondary | ICD-10-CM | POA: Diagnosis not present

## 2020-11-30 DIAGNOSIS — G629 Polyneuropathy, unspecified: Secondary | ICD-10-CM | POA: Diagnosis not present

## 2020-11-30 DIAGNOSIS — R278 Other lack of coordination: Secondary | ICD-10-CM | POA: Diagnosis not present

## 2020-11-30 DIAGNOSIS — F418 Other specified anxiety disorders: Secondary | ICD-10-CM | POA: Diagnosis not present

## 2020-11-30 DIAGNOSIS — M6281 Muscle weakness (generalized): Secondary | ICD-10-CM | POA: Diagnosis not present

## 2020-11-30 DIAGNOSIS — R41841 Cognitive communication deficit: Secondary | ICD-10-CM | POA: Diagnosis not present

## 2020-11-30 DIAGNOSIS — Z9181 History of falling: Secondary | ICD-10-CM | POA: Diagnosis not present

## 2020-11-30 DIAGNOSIS — F0280 Dementia in other diseases classified elsewhere without behavioral disturbance: Secondary | ICD-10-CM | POA: Diagnosis not present

## 2020-11-30 DIAGNOSIS — R2681 Unsteadiness on feet: Secondary | ICD-10-CM | POA: Diagnosis not present

## 2020-12-01 DIAGNOSIS — Z9181 History of falling: Secondary | ICD-10-CM | POA: Diagnosis not present

## 2020-12-01 DIAGNOSIS — G629 Polyneuropathy, unspecified: Secondary | ICD-10-CM | POA: Diagnosis not present

## 2020-12-01 DIAGNOSIS — M810 Age-related osteoporosis without current pathological fracture: Secondary | ICD-10-CM | POA: Diagnosis not present

## 2020-12-01 DIAGNOSIS — R41841 Cognitive communication deficit: Secondary | ICD-10-CM | POA: Diagnosis not present

## 2020-12-01 DIAGNOSIS — R2681 Unsteadiness on feet: Secondary | ICD-10-CM | POA: Diagnosis not present

## 2020-12-01 DIAGNOSIS — F418 Other specified anxiety disorders: Secondary | ICD-10-CM | POA: Diagnosis not present

## 2020-12-01 DIAGNOSIS — M6281 Muscle weakness (generalized): Secondary | ICD-10-CM | POA: Diagnosis not present

## 2020-12-01 DIAGNOSIS — F0280 Dementia in other diseases classified elsewhere without behavioral disturbance: Secondary | ICD-10-CM | POA: Diagnosis not present

## 2020-12-01 DIAGNOSIS — R278 Other lack of coordination: Secondary | ICD-10-CM | POA: Diagnosis not present

## 2020-12-02 DIAGNOSIS — M810 Age-related osteoporosis without current pathological fracture: Secondary | ICD-10-CM | POA: Diagnosis not present

## 2020-12-02 DIAGNOSIS — Z9181 History of falling: Secondary | ICD-10-CM | POA: Diagnosis not present

## 2020-12-02 DIAGNOSIS — R278 Other lack of coordination: Secondary | ICD-10-CM | POA: Diagnosis not present

## 2020-12-02 DIAGNOSIS — G629 Polyneuropathy, unspecified: Secondary | ICD-10-CM | POA: Diagnosis not present

## 2020-12-02 DIAGNOSIS — F0280 Dementia in other diseases classified elsewhere without behavioral disturbance: Secondary | ICD-10-CM | POA: Diagnosis not present

## 2020-12-02 DIAGNOSIS — M6281 Muscle weakness (generalized): Secondary | ICD-10-CM | POA: Diagnosis not present

## 2020-12-02 DIAGNOSIS — R2681 Unsteadiness on feet: Secondary | ICD-10-CM | POA: Diagnosis not present

## 2020-12-02 DIAGNOSIS — R41841 Cognitive communication deficit: Secondary | ICD-10-CM | POA: Diagnosis not present

## 2020-12-02 DIAGNOSIS — F418 Other specified anxiety disorders: Secondary | ICD-10-CM | POA: Diagnosis not present

## 2020-12-03 DIAGNOSIS — G629 Polyneuropathy, unspecified: Secondary | ICD-10-CM | POA: Diagnosis not present

## 2020-12-03 DIAGNOSIS — R278 Other lack of coordination: Secondary | ICD-10-CM | POA: Diagnosis not present

## 2020-12-03 DIAGNOSIS — F418 Other specified anxiety disorders: Secondary | ICD-10-CM | POA: Diagnosis not present

## 2020-12-03 DIAGNOSIS — F0280 Dementia in other diseases classified elsewhere without behavioral disturbance: Secondary | ICD-10-CM | POA: Diagnosis not present

## 2020-12-03 DIAGNOSIS — R2681 Unsteadiness on feet: Secondary | ICD-10-CM | POA: Diagnosis not present

## 2020-12-03 DIAGNOSIS — R41841 Cognitive communication deficit: Secondary | ICD-10-CM | POA: Diagnosis not present

## 2020-12-03 DIAGNOSIS — M810 Age-related osteoporosis without current pathological fracture: Secondary | ICD-10-CM | POA: Diagnosis not present

## 2020-12-03 DIAGNOSIS — Z9181 History of falling: Secondary | ICD-10-CM | POA: Diagnosis not present

## 2020-12-03 DIAGNOSIS — M6281 Muscle weakness (generalized): Secondary | ICD-10-CM | POA: Diagnosis not present

## 2020-12-06 ENCOUNTER — Encounter (HOSPITAL_BASED_OUTPATIENT_CLINIC_OR_DEPARTMENT_OTHER): Payer: Self-pay

## 2020-12-06 ENCOUNTER — Encounter (HOSPITAL_BASED_OUTPATIENT_CLINIC_OR_DEPARTMENT_OTHER): Payer: Self-pay | Admitting: *Deleted

## 2020-12-06 DIAGNOSIS — M6281 Muscle weakness (generalized): Secondary | ICD-10-CM | POA: Diagnosis not present

## 2020-12-06 DIAGNOSIS — Z9181 History of falling: Secondary | ICD-10-CM | POA: Diagnosis not present

## 2020-12-06 DIAGNOSIS — R278 Other lack of coordination: Secondary | ICD-10-CM | POA: Diagnosis not present

## 2020-12-06 DIAGNOSIS — F418 Other specified anxiety disorders: Secondary | ICD-10-CM | POA: Diagnosis not present

## 2020-12-06 DIAGNOSIS — R2681 Unsteadiness on feet: Secondary | ICD-10-CM | POA: Diagnosis not present

## 2020-12-06 DIAGNOSIS — R41841 Cognitive communication deficit: Secondary | ICD-10-CM | POA: Diagnosis not present

## 2020-12-06 DIAGNOSIS — F0280 Dementia in other diseases classified elsewhere without behavioral disturbance: Secondary | ICD-10-CM | POA: Diagnosis not present

## 2020-12-06 DIAGNOSIS — G629 Polyneuropathy, unspecified: Secondary | ICD-10-CM | POA: Diagnosis not present

## 2020-12-06 DIAGNOSIS — M810 Age-related osteoporosis without current pathological fracture: Secondary | ICD-10-CM | POA: Diagnosis not present

## 2020-12-07 DIAGNOSIS — R41841 Cognitive communication deficit: Secondary | ICD-10-CM | POA: Diagnosis not present

## 2020-12-07 DIAGNOSIS — G629 Polyneuropathy, unspecified: Secondary | ICD-10-CM | POA: Diagnosis not present

## 2020-12-07 DIAGNOSIS — F418 Other specified anxiety disorders: Secondary | ICD-10-CM | POA: Diagnosis not present

## 2020-12-07 DIAGNOSIS — Z9181 History of falling: Secondary | ICD-10-CM | POA: Diagnosis not present

## 2020-12-07 DIAGNOSIS — F0280 Dementia in other diseases classified elsewhere without behavioral disturbance: Secondary | ICD-10-CM | POA: Diagnosis not present

## 2020-12-07 DIAGNOSIS — M6281 Muscle weakness (generalized): Secondary | ICD-10-CM | POA: Diagnosis not present

## 2020-12-07 DIAGNOSIS — M810 Age-related osteoporosis without current pathological fracture: Secondary | ICD-10-CM | POA: Diagnosis not present

## 2020-12-07 DIAGNOSIS — R2681 Unsteadiness on feet: Secondary | ICD-10-CM | POA: Diagnosis not present

## 2020-12-07 DIAGNOSIS — R278 Other lack of coordination: Secondary | ICD-10-CM | POA: Diagnosis not present

## 2020-12-08 DIAGNOSIS — R41841 Cognitive communication deficit: Secondary | ICD-10-CM | POA: Diagnosis not present

## 2020-12-08 DIAGNOSIS — F418 Other specified anxiety disorders: Secondary | ICD-10-CM | POA: Diagnosis not present

## 2020-12-08 DIAGNOSIS — Z9181 History of falling: Secondary | ICD-10-CM | POA: Diagnosis not present

## 2020-12-08 DIAGNOSIS — R278 Other lack of coordination: Secondary | ICD-10-CM | POA: Diagnosis not present

## 2020-12-08 DIAGNOSIS — M6281 Muscle weakness (generalized): Secondary | ICD-10-CM | POA: Diagnosis not present

## 2020-12-08 DIAGNOSIS — G629 Polyneuropathy, unspecified: Secondary | ICD-10-CM | POA: Diagnosis not present

## 2020-12-08 DIAGNOSIS — M810 Age-related osteoporosis without current pathological fracture: Secondary | ICD-10-CM | POA: Diagnosis not present

## 2020-12-08 DIAGNOSIS — R2681 Unsteadiness on feet: Secondary | ICD-10-CM | POA: Diagnosis not present

## 2020-12-08 DIAGNOSIS — F0280 Dementia in other diseases classified elsewhere without behavioral disturbance: Secondary | ICD-10-CM | POA: Diagnosis not present

## 2021-01-06 DIAGNOSIS — Z940 Kidney transplant status: Secondary | ICD-10-CM | POA: Diagnosis not present

## 2021-01-17 DIAGNOSIS — E559 Vitamin D deficiency, unspecified: Secondary | ICD-10-CM | POA: Diagnosis not present

## 2021-01-19 DIAGNOSIS — L578 Other skin changes due to chronic exposure to nonionizing radiation: Secondary | ICD-10-CM | POA: Diagnosis not present

## 2021-01-19 DIAGNOSIS — D485 Neoplasm of uncertain behavior of skin: Secondary | ICD-10-CM | POA: Diagnosis not present

## 2021-01-19 DIAGNOSIS — L570 Actinic keratosis: Secondary | ICD-10-CM | POA: Diagnosis not present

## 2021-01-19 DIAGNOSIS — Z85828 Personal history of other malignant neoplasm of skin: Secondary | ICD-10-CM | POA: Diagnosis not present

## 2021-01-19 DIAGNOSIS — S80811A Abrasion, right lower leg, initial encounter: Secondary | ICD-10-CM | POA: Diagnosis not present

## 2021-01-19 DIAGNOSIS — D692 Other nonthrombocytopenic purpura: Secondary | ICD-10-CM | POA: Diagnosis not present

## 2021-01-19 DIAGNOSIS — L814 Other melanin hyperpigmentation: Secondary | ICD-10-CM | POA: Diagnosis not present

## 2021-01-24 DIAGNOSIS — T50905A Adverse effect of unspecified drugs, medicaments and biological substances, initial encounter: Secondary | ICD-10-CM | POA: Diagnosis not present

## 2021-01-24 DIAGNOSIS — Z8619 Personal history of other infectious and parasitic diseases: Secondary | ICD-10-CM | POA: Diagnosis not present

## 2021-01-24 DIAGNOSIS — I10 Essential (primary) hypertension: Secondary | ICD-10-CM | POA: Diagnosis not present

## 2021-01-24 DIAGNOSIS — D849 Immunodeficiency, unspecified: Secondary | ICD-10-CM | POA: Diagnosis not present

## 2021-01-24 DIAGNOSIS — Z940 Kidney transplant status: Secondary | ICD-10-CM | POA: Diagnosis not present

## 2021-02-22 ENCOUNTER — Emergency Department (HOSPITAL_BASED_OUTPATIENT_CLINIC_OR_DEPARTMENT_OTHER): Payer: Medicare PPO

## 2021-02-22 ENCOUNTER — Emergency Department (HOSPITAL_BASED_OUTPATIENT_CLINIC_OR_DEPARTMENT_OTHER)
Admission: EM | Admit: 2021-02-22 | Discharge: 2021-02-22 | Disposition: A | Discharge status: Home / Self Care | Payer: Medicare PPO | Attending: Emergency Medicine | Admitting: Emergency Medicine

## 2021-02-22 ENCOUNTER — Encounter (HOSPITAL_BASED_OUTPATIENT_CLINIC_OR_DEPARTMENT_OTHER): Payer: Self-pay

## 2021-02-22 ENCOUNTER — Other Ambulatory Visit: Payer: Self-pay

## 2021-02-22 DIAGNOSIS — Z96652 Presence of left artificial knee joint: Secondary | ICD-10-CM | POA: Insufficient documentation

## 2021-02-22 DIAGNOSIS — E039 Hypothyroidism, unspecified: Secondary | ICD-10-CM | POA: Diagnosis not present

## 2021-02-22 DIAGNOSIS — Z853 Personal history of malignant neoplasm of breast: Secondary | ICD-10-CM | POA: Insufficient documentation

## 2021-02-22 DIAGNOSIS — F0390 Unspecified dementia without behavioral disturbance: Secondary | ICD-10-CM | POA: Diagnosis not present

## 2021-02-22 DIAGNOSIS — I10 Essential (primary) hypertension: Secondary | ICD-10-CM | POA: Diagnosis not present

## 2021-02-22 DIAGNOSIS — Z79899 Other long term (current) drug therapy: Secondary | ICD-10-CM | POA: Insufficient documentation

## 2021-02-22 DIAGNOSIS — R4182 Altered mental status, unspecified: Secondary | ICD-10-CM | POA: Insufficient documentation

## 2021-02-22 LAB — URINALYSIS, ROUTINE W REFLEX MICROSCOPIC
Bilirubin Urine: NEGATIVE
Glucose, UA: NEGATIVE mg/dL
Hgb urine dipstick: NEGATIVE
Ketones, ur: NEGATIVE mg/dL
Leukocytes,Ua: NEGATIVE
Nitrite: NEGATIVE
Protein, ur: 30 mg/dL — AB
Specific Gravity, Urine: 1.02 (ref 1.005–1.030)
pH: 6 (ref 5.0–8.0)

## 2021-02-22 LAB — RAPID URINE DRUG SCREEN, HOSP PERFORMED
Amphetamines: NOT DETECTED
Barbiturates: NOT DETECTED
Benzodiazepines: NOT DETECTED
Cocaine: NOT DETECTED
Opiates: NOT DETECTED
Tetrahydrocannabinol: NOT DETECTED

## 2021-02-22 LAB — CBC WITH DIFFERENTIAL/PLATELET
Abs Immature Granulocytes: 0.07 10*3/uL (ref 0.00–0.07)
Basophils Absolute: 0 10*3/uL (ref 0.0–0.1)
Basophils Relative: 0 %
Eosinophils Absolute: 0 10*3/uL (ref 0.0–0.5)
Eosinophils Relative: 0 %
HCT: 44.2 % (ref 36.0–46.0)
Hemoglobin: 15.2 g/dL — ABNORMAL HIGH (ref 12.0–15.0)
Immature Granulocytes: 1 %
Lymphocytes Relative: 5 %
Lymphs Abs: 0.7 10*3/uL (ref 0.7–4.0)
MCH: 33.6 pg (ref 26.0–34.0)
MCHC: 34.4 g/dL (ref 30.0–36.0)
MCV: 97.6 fL (ref 80.0–100.0)
Monocytes Absolute: 0.4 10*3/uL (ref 0.1–1.0)
Monocytes Relative: 3 %
Neutro Abs: 11.4 10*3/uL — ABNORMAL HIGH (ref 1.7–7.7)
Neutrophils Relative %: 91 %
Platelets: 129 10*3/uL — ABNORMAL LOW (ref 150–400)
RBC: 4.53 MIL/uL (ref 3.87–5.11)
RDW: 12.9 % (ref 11.5–15.5)
WBC: 12.6 10*3/uL — ABNORMAL HIGH (ref 4.0–10.5)
nRBC: 0 % (ref 0.0–0.2)

## 2021-02-22 LAB — URINALYSIS, MICROSCOPIC (REFLEX)
RBC / HPF: NONE SEEN RBC/hpf (ref 0–5)
WBC, UA: NONE SEEN WBC/hpf (ref 0–5)

## 2021-02-22 LAB — COMPREHENSIVE METABOLIC PANEL
ALT: 12 U/L (ref 0–44)
AST: 20 U/L (ref 15–41)
Albumin: 4.1 g/dL (ref 3.5–5.0)
Alkaline Phosphatase: 55 U/L (ref 38–126)
Anion gap: 10 (ref 5–15)
BUN: 35 mg/dL — ABNORMAL HIGH (ref 8–23)
CO2: 24 mmol/L (ref 22–32)
Calcium: 9.3 mg/dL (ref 8.9–10.3)
Chloride: 96 mmol/L — ABNORMAL LOW (ref 98–111)
Creatinine, Ser: 1.5 mg/dL — ABNORMAL HIGH (ref 0.44–1.00)
GFR, Estimated: 34 mL/min — ABNORMAL LOW (ref >60)
Glucose, Bld: 125 mg/dL — ABNORMAL HIGH (ref 70–99)
Potassium: 4.5 mmol/L (ref 3.5–5.1)
Sodium: 130 mmol/L — ABNORMAL LOW (ref 135–145)
Total Bilirubin: 1 mg/dL (ref 0.3–1.2)
Total Protein: 7 g/dL (ref 6.5–8.1)

## 2021-02-22 LAB — I-STAT VENOUS BLOOD GAS, ED
Acid-Base Excess: 1 mmol/L (ref 0.0–2.0)
Bicarbonate: 27.2 mmol/L (ref 20.0–28.0)
Calcium, Ion: 1.22 mmol/L (ref 1.15–1.40)
HCT: 45 % (ref 36.0–46.0)
Hemoglobin: 15.3 g/dL — ABNORMAL HIGH (ref 12.0–15.0)
O2 Saturation: 53 %
Patient temperature: 98.3
Potassium: 4.5 mmol/L (ref 3.5–5.1)
Sodium: 133 mmol/L — ABNORMAL LOW (ref 135–145)
TCO2: 29 mmol/L (ref 22–32)
pCO2, Ven: 45.6 mmHg (ref 44.0–60.0)
pH, Ven: 7.383 (ref 7.250–7.430)
pO2, Ven: 29 mmHg — CL (ref 32.0–45.0)

## 2021-02-22 LAB — TSH: TSH: 1.352 u[IU]/mL (ref 0.350–4.500)

## 2021-02-22 LAB — AMMONIA: Ammonia: <10 umol/L (ref 9–35)

## 2021-02-22 MED ORDER — SODIUM CHLORIDE 0.9 % IV BOLUS
500.0000 mL | Freq: Once | INTRAVENOUS | Status: AC
  Administered 2021-02-22: 500 mL via INTRAVENOUS

## 2021-02-22 NOTE — ED Provider Notes (Signed)
  Provider Note MRN:  [id number]  Arrival date & time: 02/22/21    ED Course and Medical Decision Making  Assumed care from Dr Armandina Gemma at shift change.   See not from prior team for complete details, in brief: Reduced po last few days, reduced interaction from nursing home. Reduced from her baseline.   Plan per prior physician laboratory/imaging assessment. Reassessment.   Labs and imaging reviewed and are stable. UA without acute infection.  CT imaging of the head nonacute  Extensive discussion with spouse at bedside.  Discussed reassuring work-up in the emergency department.  Patient is able tolerate oral intake in the emergency department.  Favor ongoing chronic processes etiology of patient's presenting complaints today.  Suspicion for acute life-threatening etiology of presenting complaint is very low at this time. Stable for dc back to memory care unit.  The patient improved significantly and was discharged in stable condition. Detailed discussions were had with the patient regarding current findings, and need for close f/u with PCP or on call doctor. The patient has been instructed to return immediately if the symptoms worsen in any way for re-evaluation. Patient verbalized understanding and is in agreement with current care plan. All questions answered prior to discharge.    Procedures  Final Clinical Impressions(s) / ED Diagnoses     ICD-10-CM   1. Altered mental status, unspecified altered mental status type  R41.82       ED Discharge Orders     None       Discharge Instructions   None         Jeanell Sparrow, DO 02/22/21 2327

## 2021-02-22 NOTE — ED Notes (Addendum)
Patient lives in memory care unit at [facility]  She still goes to see her personal PCP; however, her doctor is out sick at this time and they could not get her seen by any other provider today.   Husband states the nurses at [facility] are concerned that her dementia is progressing. And specifically about a change in medication.   Nurses' note states patient has been sleepy, lethargic with poor appetite x2 days.  Baseline: she ambulates with rolling walker, able to communicate needs verbally and answers questions appropriately.

## 2021-02-22 NOTE — ED Triage Notes (Signed)
Per husband pt with increase in confusion and "dozing off a lot" x 2 days-pt NAD-to triage in own w/c-husband states pt is in memory care at [facility]

## 2021-02-22 NOTE — ED Notes (Signed)
Patient transported to CT 

## 2021-02-22 NOTE — ED Notes (Signed)
Pt discharged to memory care unit with s/o.

## 2021-02-22 NOTE — ED Notes (Signed)
[employer] called for update on patient. Requests to have report called to [place], [phone].

## 2021-02-22 NOTE — ED Notes (Signed)
Chaperone with Gina for I&O, 100cc urine collected. Pt tolerated well

## 2021-02-22 NOTE — ED Notes (Signed)
Pt. Lethargic, MD notified and at bedside.  RT at bedside applied BIPAP.  Moving to room 14

## 2021-02-22 NOTE — ED Provider Notes (Signed)
[facility]ENCY DEPARTMENT Provider Note   CSN: [id number] Arrival date & time: 02/22/21  1222     History Chief Complaint  Patient presents with   Altered Mental Status    [name] is a 87 y.o. female.   Altered Mental Status  87 year old female with a medical history below to include a history of dementia presenting to the emergency department due to concern for progression of her dementia, some somnolence and lethargy noted over the past 2 days.  She has had slowly decreased oral intake over the past few days as well.  At baseline she ambulates with a rolling walker and is able to communicate her needs verbally and answer questions.  She resides in a memory care unit at [facility].  She was going to present to her PCP today but her PCP is out sick currently so she presented to the emergency department for further evaluation.  No neurologic deficits.  No fevers or chills per her husband who provides majority of the history.  Level 5 caveat due to patient dementia.  Past Medical History:  Diagnosis Date   Arthritis    Cancer ([place]) 1988   BREAST CANCER- MASTECTOMY RIGHT - NO CHEMO NO RADIATION   CMV (cytomegalovirus infection) ([place])    Depression with anxiety    Dizziness    Gout    History of breast cancer    Hypertension    Hypothyroidism    Iron deficiency anemia    Memory loss    Osteoarthritis    Osteopenia    Patella fracture    Peripheral neuropathy    Raynauds phenomenon    Renal disorder    HX OF KIDNEY TRANSPLANT - STATES KIDNEY FUNCTION OK -DR. SANDFORD / DR. FOX -   [facility]   Sinus problem    HX OF SINUS INFECTIONS    Thyroid disease    Vitamin D deficiency    Wolff-Parkinson-White (WPW) syndrome     Patient Active Problem List   Diagnosis Date Noted   Closed pelvic ring fracture ([place]) 06/11/2018   Memory loss 06/10/2018   Gait abnormality 06/10/2018   Lumbar foraminal stenosis 03/27/2018   Facet  arthropathy, lumbar 12/19/2017   Stenosis of lateral recess of lumbar spine 12/19/2017   Syncope 10/03/2017   History of breast cancer 10/03/2017   Back pain 10/03/2017   WPW (Wolff-Parkinson-White syndrome) 10/03/2017   Polyneuropathy associated with underlying disease ([place]) 06/06/2017   Mild cognitive impairment 04/23/2017   Dizziness 04/23/2017   Expected blood loss anemia 07/23/2013   S/P Right patella ORIF 07/22/2013   Renal transplant disorder 06/07/2012   Unspecified hypothyroidism 06/07/2012   Infection, atypical mycobacterium 06/07/2012    Past Surgical History:  Procedure Laterality Date   ABDOMINAL HYSTERECTOMY     BREAST SURGERY     RIGHT MASTECTOMY AND AXILLARY NODE DISSECTION   JOINT REPLACEMENT     LEFT TOTAL KNEE REPLACEMENT    KIDNEY TRANSPLANT  march 2012   right side - surgery at [facility]   ORIF PATELLA Right 07/22/2013   Procedure: OPEN REDUCTION INTERNAL (ORIF) FIXATION PATELLA;  Surgeon: Mauri Pole, MD;  Location: [facility];  Service: Orthopedics;  Laterality: Right;   SINUS SURGERY WITH INSTATRAK       OB History   No obstetric history on file.     Family History  Problem Relation Age of Onset   Other Mother        unsure  of history    Dementia Father    Dementia Sister    Cancer Brother        lung    Social History   Tobacco Use   Smoking status: Never   Smokeless tobacco: Never  Vaping Use   Vaping Use: Never used  Substance Use Topics   Alcohol use: Not Currently   Drug use: No    Home Medications Prior to Admission medications   Medication Sig Start Date End Date Taking? Authorizing Provider  acetaminophen (TYLENOL) 325 MG tablet Take 325 mg by mouth as needed (pain).     [provider]  alendronate (FOSAMAX) 70 MG tablet Take 70 mg by mouth every 7 (seven) days.    [provider]  Calcium Carbonate (CALCIUM 600 PO) Take 1 tablet by mouth 2 (two) times daily.    [provider]   cholecalciferol (VITAMIN D) 1000 UNITS tablet Take 2,000 Units by mouth daily.     [provider]  denosumab (PROLIA) 60 MG/ML SOLN injection Inject 60 mg into the skin every 6 (six) months. Administer in upper arm, thigh, or abdomen    [provider]  levothyroxine (SYNTHROID) 50 MCG tablet TAKE 1 TABLET BY MOUTH ONCE DAILY 06/30/19 06/29/20  Deland Pretty, MD  levothyroxine (SYNTHROID, LEVOTHROID) 50 MCG tablet Take 50 mcg by mouth daily.    [provider]  lidocaine (LIDODERM) 5 % Place 1 patch onto the skin daily. Remove & Discard patch within 12 hours or as directed by MD 11/10/20   Tacy Learn, PA-C  memantine (NAMENDA) 10 MG tablet Take 1 tablet (10 mg total) by mouth 2 (two) times daily. Please request future refills from PCP. 07/14/19   Marcial Pacas, MD  memantine (NAMENDA) 10 MG tablet TAKE 1 TABLET BY MOUTH TWICE DAILY 01/05/20 01/04/21  Deland Pretty, MD  mirtazapine (REMERON) 7.5 MG tablet  08/28/17   [provider]  oxycodone (OXY-IR) 5 MG capsule Take 5 mg by mouth 2 (two) times daily as needed.    [provider]  predniSONE (DELTASONE) 10 MG tablet TAKE 1 TABLET BY MOUTH ONCE DAILY 04/30/20 04/30/21  Deland Pretty, MD  predniSONE (DELTASONE) 5 MG tablet Take 10 mg by mouth daily.     [provider]  rosuvastatin (CRESTOR) 5 MG tablet TAKE 1 TABLET BY MOUTH ONCE DAILY 10/02/19 10/01/20  Deland Pretty, MD  sertraline (ZOLOFT) 100 MG tablet TAKE 1 TABLET BY MOUTH ONCE DAILY 09/02/20     sertraline (ZOLOFT) 50 MG tablet Take 2 tablets (100 mg total) by mouth daily. 06/10/18   Marcial Pacas, MD  tacrolimus (PROGRAF) 0.5 MG capsule TAKE 1 (ONE) CAPSULE BY MOUTH TWO TIMES DAILY 07/13/20 07/13/21  Rexene Agent, MD  tacrolimus (PROGRAF) 1 MG capsule Take 2 mg by mouth 2 (two) times daily.     [provider]  tacrolimus (PROGRAF) 1 MG capsule TAKE 1 (ONE) CAPSULE BY MOUTH TWO TIMES DAILY 07/13/20 07/13/21  Rexene Agent, MD     Allergies    Cephalexin, Augmentin [amoxicillin-pot clavulanate], Doxycycline, and Minocycline  Review of Systems   Review of Systems  Unable to perform ROS: Dementia   Physical Exam Updated Vital Signs BP 98/71 (BP Location: Right Arm)   Pulse 83   Temp 98.3 F (36.8 C) (Oral)   Resp 18   SpO2 95%   Physical Exam Vitals and nursing note reviewed.  Constitutional:      General: She is not  in acute distress.    Appearance: She is well-developed.     Comments: Pleasantly demented, GCS 14 due to confusion, appears at baseline.  Alert and oriented x1.  HENT:     Head: Normocephalic and atraumatic.  Eyes:     Conjunctiva/sclera: Conjunctivae normal.     Pupils: Pupils are equal, round, and reactive to light.  Cardiovascular:     Rate and Rhythm: Normal rate and regular rhythm.     Heart sounds: No murmur heard. Pulmonary:     Effort: Pulmonary effort is normal. No respiratory distress.     Breath sounds: Normal breath sounds.  Abdominal:     General: There is no distension.     Palpations: Abdomen is soft.     Tenderness: There is no abdominal tenderness. There is no guarding.  Musculoskeletal:        General: No deformity or signs of injury.     Cervical back: Normal range of motion and neck supple.  Skin:    General: Skin is warm and dry.     Findings: No lesion or rash.  Neurological:     General: No focal deficit present.     Mental Status: She is alert. Mental status is at baseline.     Comments: MENTAL STATUS EXAM:    Orientation: Alert and oriented to person only. Memory: Cooperative, follows commands well.  Language: Speech is clear and language is normal.   CRANIAL NERVES:    CN 2 (Optic): Visual fields intact to confrontation.  CN 3,4,6 (EOM): Pupils equal and reactive to light. Full extraocular eye movement without nystagmus.  CN 5 (Trigeminal): Facial sensation is normal, no weakness of masticatory muscles.  CN 7 (Facial): No facial weakness or  asymmetry.  CN 8 (Auditory): Auditory acuity grossly normal.  CN 9,10 (Glossophar): The uvula is midline, the palate elevates symmetrically.  CN 11 (spinal access): Normal sternocleidomastoid and trapezius strength.  CN 12 (Hypoglossal): The tongue is midline. No atrophy or fasciculations.Marland Kitchen   MOTOR:  Muscle Strength: 5/5RUE, 5/5LUE, 5/5RLE, 5/5LLE.   COORDINATION:   Intact finger-to-nose, bilateral intention tremor noted.   SENSATION:   Intact to light touch all four extremities.  GAIT: Gait not assessed     ED Results / Procedures / Treatments   Labs (all labs ordered are listed, but only abnormal results are displayed) Labs Reviewed - No data to display  EKG None  Radiology No results found.  Procedures Procedures   Medications Ordered in ED Medications - No data to display  ED Course  I have reviewed the triage vital signs and the nursing notes.  Pertinent labs & imaging results that were available during my care of the patient were reviewed by me and considered in my medical decision making (see chart for details).    MDM Rules/Calculators/A&P                           87 year old female with a medical history below to include a history of dementia presenting to the emergency department due to concern for progression of her dementia, some somnolence and lethargy noted over the past 2 days.  She has had slowly decreased oral intake over the past few days as well.  At baseline she ambulates with a rolling walker and is able to communicate her needs verbally and answer questions.  She resides in a memory care unit at [facility].  She was going to present to her  PCP today but her PCP is out sick currently so she presented to the emergency department for further evaluation.  No neurologic deficits.  No fevers or chills per her husband who provides majority of the history.    On arrival, the patient was vitally stable and appeared neurologically at her baseline.  She was  afebrile, hemodynamically stable with a neurologic exam without focal deficit and at baseline for her mental status per her husband.  Differential diagnosis includes poor p.o. intake, electrolyte abnormality, hypoglycemia, less likely CVA, other toxic metabolic derangement, polypharmacy.  Mental status work-up initiated to include CT head and screening labs.  CT head was without acute intracranial abnormality.  Low concern for acute CVA.  Do not think further imaging is warranted of the head at this time.  Laboratory work-up pending at time of signout.  Plan at time of signout to follow-up laboratory imaging and reassess the patient with a plan for potential discharge back to her facility.  Signout given to Dr. Pearline Cables at 1500.  Final Clinical Impression(s) / ED Diagnoses Final diagnoses:  None    Rx / DC Orders ED Discharge Orders     None        Regan Lemming, MD 02/22/21 2038

## 2021-02-23 DIAGNOSIS — L814 Other melanin hyperpigmentation: Secondary | ICD-10-CM | POA: Diagnosis not present

## 2021-02-23 DIAGNOSIS — C44329 Squamous cell carcinoma of skin of other parts of face: Secondary | ICD-10-CM | POA: Diagnosis not present

## 2021-02-25 DIAGNOSIS — R109 Unspecified abdominal pain: Secondary | ICD-10-CM | POA: Diagnosis not present

## 2021-02-25 DIAGNOSIS — R140 Abdominal distension (gaseous): Secondary | ICD-10-CM | POA: Diagnosis not present

## 2021-03-04 DIAGNOSIS — K625 Hemorrhage of anus and rectum: Secondary | ICD-10-CM | POA: Diagnosis not present

## 2021-03-04 DIAGNOSIS — R636 Underweight: Secondary | ICD-10-CM | POA: Diagnosis not present

## 2021-03-04 DIAGNOSIS — D72829 Elevated white blood cell count, unspecified: Secondary | ICD-10-CM | POA: Diagnosis not present

## 2021-03-04 DIAGNOSIS — E871 Hypo-osmolality and hyponatremia: Secondary | ICD-10-CM | POA: Diagnosis not present

## 2021-03-11 DIAGNOSIS — I129 Hypertensive chronic kidney disease with stage 1 through stage 4 chronic kidney disease, or unspecified chronic kidney disease: Secondary | ICD-10-CM | POA: Diagnosis not present

## 2021-03-11 DIAGNOSIS — Z940 Kidney transplant status: Secondary | ICD-10-CM | POA: Diagnosis not present

## 2021-03-14 ENCOUNTER — Other Ambulatory Visit (HOSPITAL_BASED_OUTPATIENT_CLINIC_OR_DEPARTMENT_OTHER): Payer: Self-pay

## 2021-03-14 ENCOUNTER — Other Ambulatory Visit: Payer: Self-pay | Admitting: Gastroenterology

## 2021-03-14 DIAGNOSIS — R109 Unspecified abdominal pain: Secondary | ICD-10-CM

## 2021-03-14 DIAGNOSIS — R1084 Generalized abdominal pain: Secondary | ICD-10-CM | POA: Diagnosis not present

## 2021-03-14 DIAGNOSIS — K625 Hemorrhage of anus and rectum: Secondary | ICD-10-CM | POA: Diagnosis not present

## 2021-03-14 MED ORDER — HYDROCORTISONE ACETATE 25 MG RE SUPP
RECTAL | 0 refills | Status: DC
  Filled 2021-03-14: qty 28, 14d supply, fill #0

## 2021-03-16 ENCOUNTER — Ambulatory Visit
Admission: RE | Admit: 2021-03-16 | Discharge: 2021-03-16 | Disposition: A | Discharge status: Home / Self Care | Payer: Medicare PPO | Source: Ambulatory Visit | Attending: Gastroenterology | Admitting: Gastroenterology

## 2021-03-16 DIAGNOSIS — K5730 Diverticulosis of large intestine without perforation or abscess without bleeding: Secondary | ICD-10-CM | POA: Diagnosis not present

## 2021-03-16 DIAGNOSIS — K625 Hemorrhage of anus and rectum: Secondary | ICD-10-CM

## 2021-03-16 DIAGNOSIS — N1330 Unspecified hydronephrosis: Secondary | ICD-10-CM | POA: Diagnosis not present

## 2021-03-16 DIAGNOSIS — R109 Unspecified abdominal pain: Secondary | ICD-10-CM

## 2021-03-22 ENCOUNTER — Other Ambulatory Visit (HOSPITAL_BASED_OUTPATIENT_CLINIC_OR_DEPARTMENT_OTHER): Payer: Self-pay

## 2021-03-24 DIAGNOSIS — N1330 Unspecified hydronephrosis: Secondary | ICD-10-CM | POA: Diagnosis not present

## 2021-03-25 ENCOUNTER — Other Ambulatory Visit (HOSPITAL_BASED_OUTPATIENT_CLINIC_OR_DEPARTMENT_OTHER): Payer: Self-pay

## 2021-03-25 DIAGNOSIS — R935 Abnormal findings on diagnostic imaging of other abdominal regions, including retroperitoneum: Secondary | ICD-10-CM | POA: Diagnosis not present

## 2021-03-25 DIAGNOSIS — R918 Other nonspecific abnormal finding of lung field: Secondary | ICD-10-CM | POA: Diagnosis not present

## 2021-03-25 DIAGNOSIS — I3139 Other pericardial effusion (noninflammatory): Secondary | ICD-10-CM | POA: Diagnosis not present

## 2021-03-25 DIAGNOSIS — M545 Low back pain, unspecified: Secondary | ICD-10-CM | POA: Diagnosis not present

## 2021-03-25 DIAGNOSIS — K5900 Constipation, unspecified: Secondary | ICD-10-CM | POA: Diagnosis not present

## 2021-03-25 DIAGNOSIS — K7460 Unspecified cirrhosis of liver: Secondary | ICD-10-CM | POA: Diagnosis not present

## 2021-03-25 DIAGNOSIS — N1330 Unspecified hydronephrosis: Secondary | ICD-10-CM | POA: Diagnosis not present

## 2021-03-25 DIAGNOSIS — G8929 Other chronic pain: Secondary | ICD-10-CM | POA: Diagnosis not present

## 2021-03-25 DIAGNOSIS — K7689 Other specified diseases of liver: Secondary | ICD-10-CM | POA: Diagnosis not present

## 2021-03-25 MED ORDER — TRAMADOL HCL 50 MG PO TABS
ORAL_TABLET | ORAL | 2 refills | Status: DC
  Filled 2021-03-25: qty 60, 20d supply, fill #0

## 2021-03-29 ENCOUNTER — Other Ambulatory Visit (HOSPITAL_BASED_OUTPATIENT_CLINIC_OR_DEPARTMENT_OTHER): Payer: Self-pay

## 2021-04-01 DIAGNOSIS — M545 Low back pain, unspecified: Secondary | ICD-10-CM | POA: Diagnosis not present

## 2021-04-01 DIAGNOSIS — N1330 Unspecified hydronephrosis: Secondary | ICD-10-CM | POA: Diagnosis not present

## 2021-04-01 DIAGNOSIS — K625 Hemorrhage of anus and rectum: Secondary | ICD-10-CM | POA: Diagnosis not present

## 2021-04-01 DIAGNOSIS — K7460 Unspecified cirrhosis of liver: Secondary | ICD-10-CM | POA: Diagnosis not present

## 2021-04-21 DIAGNOSIS — T50905A Adverse effect of unspecified drugs, medicaments and biological substances, initial encounter: Secondary | ICD-10-CM | POA: Diagnosis not present

## 2021-04-21 DIAGNOSIS — D849 Immunodeficiency, unspecified: Secondary | ICD-10-CM | POA: Diagnosis not present

## 2021-04-21 DIAGNOSIS — Z8619 Personal history of other infectious and parasitic diseases: Secondary | ICD-10-CM | POA: Diagnosis not present

## 2021-04-21 DIAGNOSIS — I10 Essential (primary) hypertension: Secondary | ICD-10-CM | POA: Diagnosis not present

## 2021-04-21 DIAGNOSIS — Z940 Kidney transplant status: Secondary | ICD-10-CM | POA: Diagnosis not present

## 2021-04-25 DIAGNOSIS — R3914 Feeling of incomplete bladder emptying: Secondary | ICD-10-CM | POA: Diagnosis not present

## 2021-05-02 ENCOUNTER — Other Ambulatory Visit (HOSPITAL_BASED_OUTPATIENT_CLINIC_OR_DEPARTMENT_OTHER): Payer: Self-pay

## 2021-05-10 DIAGNOSIS — C44329 Squamous cell carcinoma of skin of other parts of face: Secondary | ICD-10-CM | POA: Diagnosis not present

## 2021-05-18 DIAGNOSIS — N1330 Unspecified hydronephrosis: Secondary | ICD-10-CM | POA: Diagnosis not present

## 2021-05-18 DIAGNOSIS — K625 Hemorrhage of anus and rectum: Secondary | ICD-10-CM | POA: Diagnosis not present

## 2021-05-18 DIAGNOSIS — K7460 Unspecified cirrhosis of liver: Secondary | ICD-10-CM | POA: Diagnosis not present

## 2021-06-02 DIAGNOSIS — G8929 Other chronic pain: Secondary | ICD-10-CM | POA: Diagnosis not present

## 2021-06-02 DIAGNOSIS — R636 Underweight: Secondary | ICD-10-CM | POA: Diagnosis not present

## 2021-06-02 DIAGNOSIS — M545 Low back pain, unspecified: Secondary | ICD-10-CM | POA: Diagnosis not present

## 2021-06-02 DIAGNOSIS — K625 Hemorrhage of anus and rectum: Secondary | ICD-10-CM | POA: Diagnosis not present

## 2021-06-02 DIAGNOSIS — Z940 Kidney transplant status: Secondary | ICD-10-CM | POA: Diagnosis not present

## 2021-06-02 DIAGNOSIS — E559 Vitamin D deficiency, unspecified: Secondary | ICD-10-CM | POA: Diagnosis not present

## 2021-06-02 DIAGNOSIS — F0390 Unspecified dementia without behavioral disturbance: Secondary | ICD-10-CM | POA: Diagnosis not present

## 2021-06-02 DIAGNOSIS — E039 Hypothyroidism, unspecified: Secondary | ICD-10-CM | POA: Diagnosis not present

## 2021-06-30 DIAGNOSIS — F0280 Dementia in other diseases classified elsewhere without behavioral disturbance: Secondary | ICD-10-CM | POA: Diagnosis not present

## 2021-06-30 DIAGNOSIS — I456 Pre-excitation syndrome: Secondary | ICD-10-CM | POA: Diagnosis not present

## 2021-06-30 DIAGNOSIS — E559 Vitamin D deficiency, unspecified: Secondary | ICD-10-CM | POA: Diagnosis not present

## 2021-06-30 DIAGNOSIS — E039 Hypothyroidism, unspecified: Secondary | ICD-10-CM | POA: Diagnosis not present

## 2021-06-30 DIAGNOSIS — N189 Chronic kidney disease, unspecified: Secondary | ICD-10-CM | POA: Diagnosis not present

## 2021-06-30 DIAGNOSIS — I129 Hypertensive chronic kidney disease with stage 1 through stage 4 chronic kidney disease, or unspecified chronic kidney disease: Secondary | ICD-10-CM | POA: Diagnosis not present

## 2021-06-30 DIAGNOSIS — Z940 Kidney transplant status: Secondary | ICD-10-CM | POA: Diagnosis not present

## 2021-06-30 DIAGNOSIS — G629 Polyneuropathy, unspecified: Secondary | ICD-10-CM | POA: Diagnosis not present

## 2021-06-30 DIAGNOSIS — E7800 Pure hypercholesterolemia, unspecified: Secondary | ICD-10-CM | POA: Diagnosis not present

## 2021-07-12 DIAGNOSIS — Z940 Kidney transplant status: Secondary | ICD-10-CM | POA: Diagnosis not present

## 2021-07-18 DIAGNOSIS — I10 Essential (primary) hypertension: Secondary | ICD-10-CM | POA: Diagnosis not present

## 2021-07-18 DIAGNOSIS — Z8619 Personal history of other infectious and parasitic diseases: Secondary | ICD-10-CM | POA: Diagnosis not present

## 2021-07-18 DIAGNOSIS — T50905A Adverse effect of unspecified drugs, medicaments and biological substances, initial encounter: Secondary | ICD-10-CM | POA: Diagnosis not present

## 2021-07-18 DIAGNOSIS — Z940 Kidney transplant status: Secondary | ICD-10-CM | POA: Diagnosis not present

## 2021-07-18 DIAGNOSIS — D849 Immunodeficiency, unspecified: Secondary | ICD-10-CM | POA: Diagnosis not present

## 2021-07-25 DIAGNOSIS — R3914 Feeling of incomplete bladder emptying: Secondary | ICD-10-CM | POA: Diagnosis not present

## 2021-08-16 ENCOUNTER — Emergency Department (HOSPITAL_BASED_OUTPATIENT_CLINIC_OR_DEPARTMENT_OTHER): Payer: Medicare PPO

## 2021-08-16 ENCOUNTER — Encounter (HOSPITAL_BASED_OUTPATIENT_CLINIC_OR_DEPARTMENT_OTHER): Payer: Self-pay

## 2021-08-16 ENCOUNTER — Other Ambulatory Visit: Payer: Self-pay

## 2021-08-16 ENCOUNTER — Observation Stay (HOSPITAL_BASED_OUTPATIENT_CLINIC_OR_DEPARTMENT_OTHER)
Admission: EM | Admit: 2021-08-16 | Discharge: 2021-08-17 | Disposition: A | Discharge status: Home Health | Payer: Medicare PPO | Attending: Internal Medicine | Admitting: Internal Medicine

## 2021-08-16 DIAGNOSIS — R4182 Altered mental status, unspecified: Secondary | ICD-10-CM | POA: Diagnosis not present

## 2021-08-16 DIAGNOSIS — Z66 Do not resuscitate: Secondary | ICD-10-CM | POA: Diagnosis not present

## 2021-08-16 DIAGNOSIS — R109 Unspecified abdominal pain: Secondary | ICD-10-CM | POA: Diagnosis not present

## 2021-08-16 DIAGNOSIS — Z79899 Other long term (current) drug therapy: Secondary | ICD-10-CM | POA: Insufficient documentation

## 2021-08-16 DIAGNOSIS — I10 Essential (primary) hypertension: Secondary | ICD-10-CM | POA: Insufficient documentation

## 2021-08-16 DIAGNOSIS — Z20822 Contact with and (suspected) exposure to covid-19: Secondary | ICD-10-CM | POA: Diagnosis not present

## 2021-08-16 DIAGNOSIS — Z96652 Presence of left artificial knee joint: Secondary | ICD-10-CM | POA: Diagnosis not present

## 2021-08-16 DIAGNOSIS — F0390 Unspecified dementia without behavioral disturbance: Secondary | ICD-10-CM | POA: Insufficient documentation

## 2021-08-16 DIAGNOSIS — F0280 Dementia in other diseases classified elsewhere without behavioral disturbance: Secondary | ICD-10-CM | POA: Diagnosis present

## 2021-08-16 DIAGNOSIS — G9340 Encephalopathy, unspecified: Secondary | ICD-10-CM | POA: Diagnosis not present

## 2021-08-16 DIAGNOSIS — Z853 Personal history of malignant neoplasm of breast: Secondary | ICD-10-CM | POA: Insufficient documentation

## 2021-08-16 DIAGNOSIS — R519 Headache, unspecified: Secondary | ICD-10-CM | POA: Diagnosis not present

## 2021-08-16 DIAGNOSIS — E039 Hypothyroidism, unspecified: Secondary | ICD-10-CM | POA: Diagnosis not present

## 2021-08-16 DIAGNOSIS — Z940 Kidney transplant status: Secondary | ICD-10-CM | POA: Diagnosis not present

## 2021-08-16 DIAGNOSIS — E860 Dehydration: Secondary | ICD-10-CM | POA: Diagnosis not present

## 2021-08-16 DIAGNOSIS — I456 Pre-excitation syndrome: Secondary | ICD-10-CM | POA: Diagnosis present

## 2021-08-16 LAB — CBC WITH DIFFERENTIAL/PLATELET
Abs Immature Granulocytes: 0.18 10*3/uL — ABNORMAL HIGH (ref 0.00–0.07)
Basophils Absolute: 0 10*3/uL (ref 0.0–0.1)
Basophils Relative: 0 %
Eosinophils Absolute: 0 10*3/uL (ref 0.0–0.5)
Eosinophils Relative: 0 %
HCT: 40 % (ref 36.0–46.0)
Hemoglobin: 13.9 g/dL (ref 12.0–15.0)
Immature Granulocytes: 2 %
Lymphocytes Relative: 7 %
Lymphs Abs: 0.9 10*3/uL (ref 0.7–4.0)
MCH: 32.5 pg (ref 26.0–34.0)
MCHC: 34.8 g/dL (ref 30.0–36.0)
MCV: 93.5 fL (ref 80.0–100.0)
Monocytes Absolute: 0.4 10*3/uL (ref 0.1–1.0)
Monocytes Relative: 3 %
Neutro Abs: 10.4 10*3/uL — ABNORMAL HIGH (ref 1.7–7.7)
Neutrophils Relative %: 88 %
Platelets: 118 10*3/uL — ABNORMAL LOW (ref 150–400)
RBC: 4.28 MIL/uL (ref 3.87–5.11)
RDW: 14.3 % (ref 11.5–15.5)
WBC: 11.9 10*3/uL — ABNORMAL HIGH (ref 4.0–10.5)
nRBC: 0 % (ref 0.0–0.2)

## 2021-08-16 LAB — COMPREHENSIVE METABOLIC PANEL
ALT: 13 U/L (ref 0–44)
AST: 17 U/L (ref 15–41)
Albumin: 4.1 g/dL (ref 3.5–5.0)
Alkaline Phosphatase: 44 U/L (ref 38–126)
Anion gap: 12 (ref 5–15)
BUN: 41 mg/dL — ABNORMAL HIGH (ref 8–23)
CO2: 25 mmol/L (ref 22–32)
Calcium: 9.4 mg/dL (ref 8.9–10.3)
Chloride: 104 mmol/L (ref 98–111)
Creatinine, Ser: 1.43 mg/dL — ABNORMAL HIGH (ref 0.44–1.00)
GFR, Estimated: 35 mL/min — ABNORMAL LOW (ref >60)
Glucose, Bld: 124 mg/dL — ABNORMAL HIGH (ref 70–99)
Potassium: 3.8 mmol/L (ref 3.5–5.1)
Sodium: 141 mmol/L (ref 135–145)
Total Bilirubin: 0.8 mg/dL (ref 0.3–1.2)
Total Protein: 6.5 g/dL (ref 6.5–8.1)

## 2021-08-16 LAB — URINALYSIS, ROUTINE W REFLEX MICROSCOPIC
Bilirubin Urine: NEGATIVE
Glucose, UA: NEGATIVE mg/dL
Hgb urine dipstick: NEGATIVE
Ketones, ur: NEGATIVE mg/dL
Leukocytes,Ua: NEGATIVE
Nitrite: NEGATIVE
Specific Gravity, Urine: 1.014 (ref 1.005–1.030)
pH: 7 (ref 5.0–8.0)

## 2021-08-16 LAB — TROPONIN I (HIGH SENSITIVITY)
Troponin I (High Sensitivity): 28 ng/L — ABNORMAL HIGH (ref <18)
Troponin I (High Sensitivity): 27 ng/L — ABNORMAL HIGH (ref <18)

## 2021-08-16 LAB — AMMONIA: Ammonia: 36 umol/L — ABNORMAL HIGH (ref 9–35)

## 2021-08-16 LAB — TSH: TSH: 0.439 u[IU]/mL (ref 0.350–4.500)

## 2021-08-16 LAB — RESP PANEL BY RT-PCR (FLU A&B, COVID) ARPGX2
Influenza A by PCR: NEGATIVE
Influenza B by PCR: NEGATIVE
SARS Coronavirus 2 by RT PCR: NEGATIVE

## 2021-08-16 LAB — VITAMIN B12: Vitamin B-12: 237 pg/mL (ref 180–914)

## 2021-08-16 LAB — LIPASE, BLOOD: Lipase: 24 U/L (ref 11–51)

## 2021-08-16 MED ORDER — ACETAMINOPHEN 500 MG PO TABS
1000.0000 mg | ORAL_TABLET | Freq: Once | ORAL | Status: AC
  Administered 2021-08-16: 1000 mg via ORAL
  Filled 2021-08-16: qty 2

## 2021-08-16 MED ORDER — SODIUM CHLORIDE 0.9 % IV BOLUS
500.0000 mL | Freq: Once | INTRAVENOUS | Status: AC
  Administered 2021-08-16: 500 mL via INTRAVENOUS

## 2021-08-16 MED ORDER — ONDANSETRON HCL 4 MG/2ML IJ SOLN
4.0000 mg | Freq: Once | INTRAMUSCULAR | Status: AC
  Administered 2021-08-16: 4 mg via INTRAVENOUS
  Filled 2021-08-16: qty 2

## 2021-08-16 NOTE — ED Triage Notes (Signed)
From skilled nursing facility via EMS  Staff states woke up with headache which is light sensitive.  Frontal headache.  Denies any other pain.  From memory center. ?

## 2021-08-16 NOTE — ED Notes (Signed)
Pt assisted with bedpan. Temp updated. ?

## 2021-08-16 NOTE — ED Notes (Signed)
Spoke to pt husband about delay in transport and transfer to hospital. Husband sts that no one told him that she was being admitted and that he was very irritated with her care. Sts that he has not eaten and is displeased with options that are available. Explained to husband that pt will have a bed tonight but unsure of when that will be. Blood drawn from pt, pt pleasant and talking to nurse. ?

## 2021-08-16 NOTE — ED Provider Notes (Signed)
On reassessment, patient still has a headache and some dizziness.  Her husband states she is still not quite acting like herself.  Her work-up is reassuring with negative urinalysis, negative chest with a negative head CT. ? ?CT of the abdomen pelvis did not show any acute pathology.  Troponins remain flat. ? ?Creatinine appears to be near baseline.  IV fluids have been given. ? ?Some concern from the patient's husband that patient is not at her baseline and she is still complaining of some dizziness and lightheadedness.  May benefit from further evaluation for altered mental status including brain MRI. ? ?D/w Dr. Rogers Blocker.  ?  Ezequiel Essex, MD ?08/17/21 1464 ? ?

## 2021-08-16 NOTE — ED Notes (Signed)
Given drink and crackers for PO challenge  ?

## 2021-08-16 NOTE — ED Notes (Signed)
Patient transported to CT 

## 2021-08-16 NOTE — ED Provider Notes (Signed)
? ?Emergency Department Provider Note ? ? ?I have reviewed the triage vital signs and the nursing notes. ? ? ?HISTORY ? ?Chief Complaint ?Headache ? ? ?HPI ?[name] is a 88 y.o. female with past medical history reviewed below including dementia, currently at memory care unit, presents with headache along with fatigue.  Her husband at bedside provides additional history.  He tells me that she has responded well to being in memory care and is overall very agreeable.  He missed seeing her this morning due to needing some blood work done but was called by staff stating that she is complaining of frontal headache.  He arrives here and finds her to be more subdued compared to her normal self but overall pleasant and not particularly more confused than normal.  The patient tells me she is having frontal headache and cannot remember when exactly it started.  Husband states that she had given complaining of some abdominal pain recently although history is somewhat difficult due to her underlying memory issues.  ? ? ?Past Medical History:  ?Diagnosis Date  ? Arthritis   ? Cancer [facility]) 1988  ? BREAST CANCER- MASTECTOMY RIGHT - NO CHEMO NO RADIATION  ? CMV (cytomegalovirus infection) ([place])   ? Depression with anxiety   ? Dizziness   ? Gout   ? History of breast cancer   ? Hypertension   ? Hypothyroidism   ? Iron deficiency anemia   ? Memory loss   ? Osteoarthritis   ? Osteopenia   ? Patella fracture   ? Peripheral neuropathy   ? Raynauds phenomenon   ? Renal disorder   ? HX OF KIDNEY TRANSPLANT - STATES KIDNEY FUNCTION OK -DR. SANDFORD / DR. FOX -   [facility]  ? Sinus problem   ? HX OF SINUS INFECTIONS   ? Thyroid disease   ? Vitamin D deficiency   ? Wolff-Parkinson-White (WPW) syndrome   ? ? ?Review of Systems ? ?Constitutional: No fever/chills ?Cardiovascular: Denies chest pain. ?Respiratory: Denies shortness of breath. ?Gastrointestinal: ? abdominal pain.  No nausea, no vomiting.  No diarrhea.  No  constipation. ?Musculoskeletal: Positive for chronic back pain, not worse.  ?Skin: Negative for rash. ?Neurological: Negative for focal weakness or numbness. Positive HA.  ? ? ?____________________________________________ ? ? ?PHYSICAL EXAM: ? ?VITAL SIGNS: ?ED Triage Vitals  ?Enc Vitals Group  ?   BP 08/16/21 1107 (!) 159/110  ?   Pulse Rate 08/16/21 1107 72  ?   Resp 08/16/21 1107 18  ?   Temp 08/16/21 1107 97.8 ?F (36.6 ?C)  ?   Temp Source 08/16/21 1107 Oral  ?   SpO2 08/16/21 1107 100 %  ?   Weight 08/16/21 1110 120 lb (54.4 kg)  ?   Height 08/16/21 1110 '5\' 6"'$  (1.676 m)  ? ?Constitutional: Alert but pleasantly confused. Well appearing and in no acute distress. ?Eyes: Conjunctivae are normal. PERRL. ?Head: Atraumatic. ?Nose: No congestion/rhinnorhea. ?Mouth/Throat: Mucous membranes are moist.  ?Neck: No stridor.   ?Cardiovascular: Normal rate, regular rhythm. Good peripheral circulation. Grossly normal heart sounds.   ?Respiratory: Normal respiratory effort.  No retractions. Lungs CTAB. ?Gastrointestinal: Soft and nontender. No distention.  ?Musculoskeletal: No lower extremity tenderness nor edema. No gross deformities of extremities. ?Neurologic:  Normal speech and language. No gross focal neurologic deficits are appreciated.  ?Skin:  Skin is warm, dry and intact. No rash noted. ? ?____________________________________________ ?  ?LABS ?(all labs ordered are listed, but only abnormal results are  displayed) ? ?Labs Reviewed  ?COMPREHENSIVE METABOLIC PANEL - Abnormal; Notable for the following components:  ?    Result Value  ? Glucose, Bld 124 (*)   ? BUN 41 (*)   ? Creatinine, Ser 1.43 (*)   ? GFR, Estimated 35 (*)   ? All other components within normal limits  ?CBC WITH DIFFERENTIAL/PLATELET - Abnormal; Notable for the following components:  ? WBC 11.9 (*)   ? Platelets 118 (*)   ? Neutro Abs 10.4 (*)   ? Abs Immature Granulocytes 0.18 (*)   ? All other components within normal limits  ?URINALYSIS, ROUTINE W  REFLEX MICROSCOPIC - Abnormal; Notable for the following components:  ? Protein, ur TRACE (*)   ? All other components within normal limits  ?AMMONIA - Abnormal; Notable for the following components:  ? Ammonia 36 (*)   ? All other components within normal limits  ?COMPREHENSIVE METABOLIC PANEL - Abnormal; Notable for the following components:  ? BUN 31 (*)   ? Creatinine, Ser 1.64 (*)   ? Total Protein 6.3 (*)   ? Total Bilirubin 1.6 (*)   ? GFR, Estimated 30 (*)   ? All other components within normal limits  ?CBC WITH DIFFERENTIAL/PLATELET - Abnormal; Notable for the following components:  ? Platelets 130 (*)   ? Abs Immature Granulocytes 0.12 (*)   ? All other components within normal limits  ?TROPONIN I (HIGH SENSITIVITY) - Abnormal; Notable for the following components:  ? Troponin I (High Sensitivity) 28 (*)   ? All other components within normal limits  ?TROPONIN I (HIGH SENSITIVITY) - Abnormal; Notable for the following components:  ? Troponin I (High Sensitivity) 27 (*)   ? All other components within normal limits  ?URINE CULTURE  ?RESP PANEL BY RT-PCR (FLU A&B, COVID) ARPGX2  ?LIPASE, BLOOD  ?TSH  ?VITAMIN B12  ? ?____________________________________________ ? ?EKG ? ? EKG Interpretation ? ?Date/Time:  Tuesday Aug 16 2021 11:42:37 EDT ?Ventricular Rate:  73 ?PR Interval:  159 ?QRS Duration: 87 ?QT Interval:  417 ?QTC Calculation: 460 ?R Axis:   63 ?Text Interpretation: Sinus rhythm  Nonspecific T wave changes similar to prior Confirmed by Nanda Quinton [phone]) on 08/16/2021 11:44:27 AM ?  ? ?  ? ? ?____________________________________________ ? ? ?PROCEDURES ? ?Procedure(s) performed:  ? ?Procedures ? ?None ?____________________________________________ ? ? ?INITIAL IMPRESSION / ASSESSMENT AND PLAN / ED COURSE ? ?Pertinent labs & imaging results that were available during my care of the patient were reviewed by me and considered in my medical decision making (see chart for details). ?  ?This patient is  Presenting for Evaluation of headache, which does require a range of treatment options, and is a complaint that involves a high risk of morbidity and mortality. ? ?The Differential Diagnoses includes but is not exclusive to subarachnoid hemorrhage, meningitis, encephalitis, previous head trauma, cavernous venous thrombosis, muscle tension headache, glaucoma, temporal arteritis, migraine or migraine equivalent, etc. ? ? ?Critical Interventions-  ?  ?Medications  ?sodium chloride 0.9 % bolus 500 mL (0 mLs Intravenous Stopped 08/16/21 1613)  ?acetaminophen (TYLENOL) tablet 1,000 mg (1,000 mg Oral Given 08/16/21 1144)  ?ondansetron [facility]) injection 4 mg (4 mg Intravenous Given 08/16/21 1205)  ?sodium chloride 0.9 % bolus 500 mL (0 mLs Intravenous Stopped 08/16/21 1756)  ? ? ?Reassessment after intervention:  Patient feeling slightly better. ? ? ?I did obtain Additional Historical Information from EMS and husband. ? ?I decided to review pertinent External Data, and in summary last  ED visit here was for AMS in November 2022. Chart reviewed. ?  ?Clinical Laboratory Tests Ordered, included CBC with mild leukocytosis.  COVID and flu negative.  UA is unremarkable.  Troponin pending.  ? ?Radiologic Tests Ordered, included CT head. I independently interpreted the images and agree with radiology interpretation.  ? ?Cardiac Monitor Tracing which shows NSR. ? ? ?Social Determinants of Health Risk patient is a non-smoker.  ? ? ?Medical Decision Making: Summary:  ?Patient presents emergency department with headache this morning.  No focal deficits or clear altered mental status.  May be more sleepy compared to normal according to her husband at bedside.  No fever.  Doubt infectious process.  Given age, plan for CT imaging of the head without contrast, IV fluids, Tylenol, UA, COVID screening and reassess. ? ?Reevaluation with update and discussion with patient and husband. CT abdomen/pelvis ordered with vomiting here and troponin is  pending. Care transferred to Dr. Wyvonnia Dusky.  ? ?Disposition: pending ? ?____________________________________________ ? ?FINAL CLINICAL IMPRESSION(S) / ED DIAGNOSES ? ?Final diagnoses:  ?Altered mental status, unspeci

## 2021-08-16 NOTE — Progress Notes (Signed)
Plan of Care Note for accepted transfer ? ? ?Patient: [name] MRN: [id number]    ?DOA: 08/16/2021 ? ?Facility requesting transfer: [facility] ?Requesting Provider: Dr. Wyvonnia Dusky  ?Reason for transfer: AMS from baseline/dizziness/headache  ?Facility course: 88 y.o from SNF with history of dementia, HTN,  hypothyroidism, hx of breast cancer, hx of renal transplant, CKD who presented to ED with headache that woke her up with associated light sensitivity. Her husband also thought she was acting more confused than her baseline. She is also dizzy as well.  ? ?Vitals: stable, hypertensive: 159/110 ?CT head: no acute intracranial finding ?Ct abdomen/pelvis: no acute findings ? ?Work up unremarkable thus far, but still not at her baseline and complaining of dizziness. Will bring her over for MRI and add on some metabolic labs.  ? ? ? ?Plan of care: ?The patient is accepted for admission to Telemetry unit, at [facility]..  ? ? ?Author: ?Orma Flaming, MD ?08/16/2021 ? ?Check [email or web address] for on-call coverage. ? ?Nursing staff, Please call [place] number on Amion as soon as patient's arrival, so appropriate admitting provider can evaluate the pt. ?

## 2021-08-17 ENCOUNTER — Encounter (HOSPITAL_COMMUNITY): Payer: Self-pay | Admitting: Family Medicine

## 2021-08-17 ENCOUNTER — Observation Stay (HOSPITAL_COMMUNITY): Payer: Medicare PPO

## 2021-08-17 DIAGNOSIS — Z96652 Presence of left artificial knee joint: Secondary | ICD-10-CM | POA: Diagnosis not present

## 2021-08-17 DIAGNOSIS — E039 Hypothyroidism, unspecified: Secondary | ICD-10-CM | POA: Diagnosis not present

## 2021-08-17 DIAGNOSIS — I10 Essential (primary) hypertension: Secondary | ICD-10-CM | POA: Diagnosis not present

## 2021-08-17 DIAGNOSIS — R109 Unspecified abdominal pain: Secondary | ICD-10-CM | POA: Diagnosis not present

## 2021-08-17 DIAGNOSIS — F0390 Unspecified dementia without behavioral disturbance: Secondary | ICD-10-CM | POA: Diagnosis not present

## 2021-08-17 DIAGNOSIS — Z79899 Other long term (current) drug therapy: Secondary | ICD-10-CM | POA: Diagnosis not present

## 2021-08-17 DIAGNOSIS — Z66 Do not resuscitate: Secondary | ICD-10-CM | POA: Diagnosis not present

## 2021-08-17 DIAGNOSIS — Z853 Personal history of malignant neoplasm of breast: Secondary | ICD-10-CM | POA: Diagnosis not present

## 2021-08-17 DIAGNOSIS — Z940 Kidney transplant status: Secondary | ICD-10-CM | POA: Diagnosis not present

## 2021-08-17 DIAGNOSIS — G9340 Encephalopathy, unspecified: Secondary | ICD-10-CM | POA: Diagnosis present

## 2021-08-17 DIAGNOSIS — R4182 Altered mental status, unspecified: Secondary | ICD-10-CM | POA: Diagnosis not present

## 2021-08-17 DIAGNOSIS — R519 Headache, unspecified: Secondary | ICD-10-CM | POA: Diagnosis present

## 2021-08-17 DIAGNOSIS — Z20822 Contact with and (suspected) exposure to covid-19: Secondary | ICD-10-CM | POA: Diagnosis not present

## 2021-08-17 DIAGNOSIS — E860 Dehydration: Secondary | ICD-10-CM | POA: Diagnosis not present

## 2021-08-17 LAB — COMPREHENSIVE METABOLIC PANEL
ALT: 17 U/L (ref 0–44)
AST: 23 U/L (ref 15–41)
Albumin: 3.8 g/dL (ref 3.5–5.0)
Alkaline Phosphatase: 40 U/L (ref 38–126)
Anion gap: 9 (ref 5–15)
BUN: 31 mg/dL — ABNORMAL HIGH (ref 8–23)
CO2: 26 mmol/L (ref 22–32)
Calcium: 9.1 mg/dL (ref 8.9–10.3)
Chloride: 104 mmol/L (ref 98–111)
Creatinine, Ser: 1.64 mg/dL — ABNORMAL HIGH (ref 0.44–1.00)
GFR, Estimated: 30 mL/min — ABNORMAL LOW (ref >60)
Glucose, Bld: 90 mg/dL (ref 70–99)
Potassium: 4 mmol/L (ref 3.5–5.1)
Sodium: 139 mmol/L (ref 135–145)
Total Bilirubin: 1.6 mg/dL — ABNORMAL HIGH (ref 0.3–1.2)
Total Protein: 6.3 g/dL — ABNORMAL LOW (ref 6.5–8.1)

## 2021-08-17 LAB — URINE CULTURE: Culture: NO GROWTH

## 2021-08-17 LAB — CBC WITH DIFFERENTIAL/PLATELET
Abs Immature Granulocytes: 0.12 10*3/uL — ABNORMAL HIGH (ref 0.00–0.07)
Basophils Absolute: 0 10*3/uL (ref 0.0–0.1)
Basophils Relative: 0 %
Eosinophils Absolute: 0 10*3/uL (ref 0.0–0.5)
Eosinophils Relative: 0 %
HCT: 40.6 % (ref 36.0–46.0)
Hemoglobin: 14.3 g/dL (ref 12.0–15.0)
Immature Granulocytes: 1 %
Lymphocytes Relative: 24 %
Lymphs Abs: 2.1 10*3/uL (ref 0.7–4.0)
MCH: 33.2 pg (ref 26.0–34.0)
MCHC: 35.2 g/dL (ref 30.0–36.0)
MCV: 94.2 fL (ref 80.0–100.0)
Monocytes Absolute: 0.5 10*3/uL (ref 0.1–1.0)
Monocytes Relative: 6 %
Neutro Abs: 6.1 10*3/uL (ref 1.7–7.7)
Neutrophils Relative %: 69 %
Platelets: 130 10*3/uL — ABNORMAL LOW (ref 150–400)
RBC: 4.31 MIL/uL (ref 3.87–5.11)
RDW: 14.1 % (ref 11.5–15.5)
WBC: 8.9 10*3/uL (ref 4.0–10.5)
nRBC: 0 % (ref 0.0–0.2)

## 2021-08-17 MED ORDER — ENOXAPARIN SODIUM 30 MG/0.3ML IJ SOSY: 30.0000 mg | PREFILLED_SYRINGE | INTRAMUSCULAR | Status: DC

## 2021-08-17 MED ORDER — PREDNISONE 5 MG PO TABS
10.0000 mg | ORAL_TABLET | Freq: Every day | ORAL | Status: DC
  Administered 2021-08-17: 10 mg via ORAL
  Filled 2021-08-17: qty 2

## 2021-08-17 MED ORDER — ACETAMINOPHEN 325 MG PO TABS: 650.0000 mg | ORAL_TABLET | Freq: Four times a day (QID) | ORAL | Status: DC | PRN

## 2021-08-17 MED ORDER — MEMANTINE HCL 10 MG PO TABS
10.0000 mg | ORAL_TABLET | Freq: Two times a day (BID) | ORAL | Status: DC
  Administered 2021-08-17: 10 mg via ORAL
  Filled 2021-08-17: qty 1

## 2021-08-17 MED ORDER — ACETAMINOPHEN 650 MG RE SUPP
650.0000 mg | Freq: Four times a day (QID) | RECTAL | Status: DC | PRN
Start: 2021-08-17 — End: 2021-08-17

## 2021-08-17 MED ORDER — TACROLIMUS 1 MG PO CAPS
1.5000 mg | ORAL_CAPSULE | Freq: Two times a day (BID) | ORAL | Status: DC
  Administered 2021-08-17: 1.5 mg via ORAL
  Filled 2021-08-17 (×2): qty 1

## 2021-08-17 MED ORDER — LEVOTHYROXINE SODIUM 50 MCG PO TABS
50.0000 ug | ORAL_TABLET | Freq: Every day | ORAL | Status: DC
  Administered 2021-08-17: 50 ug via ORAL
  Filled 2021-08-17: qty 1

## 2021-08-17 MED ORDER — TACROLIMUS 0.5 MG PO CAPS: 0.5000 mg | ORAL_CAPSULE | ORAL | Status: DC

## 2021-08-17 NOTE — Discharge Summary (Addendum)
Physician Discharge Summary  ?[name] [id number]:[id number] DOB: February 05, 1932 DOA: 08/16/2021 ? ?PCP: Deland Pretty, MD ? ?Admit date: 08/16/2021 ?Discharge date: 08/17/2021 ? ?Time spent: 45 minutes ? ?Recommendations for Outpatient Follow-up:  ?PCP in 1 week ? ? ?Discharge Diagnoses:  ?Principal Problem: ?Headache ?Abdominal pain ?  WPW (Wolff-Parkinson-White syndrome) ?  Dementia associated with other underlying disease without behavioral disturbance ([place]) ?DO NOT RESUSCITATE ? ? ?Discharge Condition: Stable ? ?Diet recommendation: Low-sodium ? ?Filed Weights  ? 08/16/21 1110  ?Weight: 54.4 kg  ? ? ?History of present illness:  ?[name] is a 88 y.o. female with history of dementia who at this time lives in dementia unit, renal transplant on immunosuppressants, hypothyroidism was brought to the ER at med center with complaints of headache.  In the ER patient's husband stated that patient also looks different from her usual baseline and was looking increasingly confused.  In addition patient also was complaining of nonspecific abdominal discomfort and dizziness. ? ?Hospital Course:  ? ?Headache and mild abdominal discomfort ?-Resolved, she was admitted overnight for an MRI brain which is completed and is unremarkable ?-Denies further headaches, she also had a CT abdomen pelvis completed in the emergency room which was also unremarkable ?-Lab work-up is unrevealing ? ?Rest of her chronic medical problems are stable as noted below, she will discharge back to memory care unit at [facility]. ?I discussed with pts spouse Dr.Condie, re: code status, he stated she is a DNR ?Renal transplant with creatinine levels at baseline.  On prednisone and tacrolimus. ?History of WPW syndrome no acute findings. ?Dementia on Namenda. ?Hypothyroidism on Synthroid. ? ? ? ?Discharge Exam: ?Vitals:  ? 08/17/21 0332 08/17/21 0740  ?BP: (!) 154/97 (!) 160/93  ?Pulse: 75 86  ?Resp: 17 17  ?Temp: 97.8 ?F (36.6 ?C) 98.1 ?F  (36.7 ?C)  ?SpO2: 97% 98%  ? ? ?General: Pleasant awake alert oriented to self and place only, mild cognitive deficits ?Cardiovascular: S1-S2, regular rate rhythm ?Respiratory: Clear ? ?Discharge Instructions ? ? ?Discharge Instructions   ? ? Diet - low sodium heart healthy   Complete by: As directed ?  ? Increase activity slowly   Complete by: As directed ?  ? ?  ? ?Allergies as of 08/17/2021   ? ?   Reactions  ? Cephalexin Hives  ? Augmentin [amoxicillin-pot Clavulanate] Nausea And Vomiting  ? Doxycycline Nausea And Vomiting  ? Minocycline Hives, Other (See Comments)  ? Gave bladder infection   ? ?  ? ?  ?Medication List  ?  ? ?STOP taking these medications   ? ?denosumab 60 MG/ML Soln injection ?Commonly known as: PROLIA ?  ?hydrocortisone 25 MG suppository ?Commonly known as: ANUSOL-HC ?  ?lidocaine 5 % ?Commonly known as: Lidoderm ?  ?sertraline 100 MG tablet ?Commonly known as: ZOLOFT ?  ?sertraline 50 MG tablet ?Commonly known as: ZOLOFT ?  ? ?  ? ?TAKE these medications   ? ?acetaminophen 325 MG tablet ?Commonly known as: TYLENOL ?Take 325 mg by mouth as needed (pain). ?  ?acidophilus Caps capsule ?Take 1 capsule by mouth daily. ?  ?CALCIUM 600 PO ?Take 1 tablet by mouth every evening. ?  ?cetirizine 10 MG tablet ?Commonly known as: ZYRTEC ?Take 10 mg by mouth at bedtime. ?  ?ergocalciferol 1.25 MG (50000 UT) capsule ?Commonly known as: VITAMIN D2 ?Take 50,000 Units by mouth once a week. ?  ?levothyroxine 50 MCG tablet ?Commonly known as: SYNTHROID ?Take 50 mcg by mouth daily. ?  ?  memantine 10 MG tablet ?Commonly known as: NAMENDA ?Take 1 tablet (10 mg total) by mouth 2 (two) times daily. Please request future refills from PCP. ?  ?polyethylene glycol powder 17 GM/SCOOP powder ?Commonly known as: GLYCOLAX/MIRALAX ?Take 17 g by mouth daily as needed. ?  ?predniSONE 5 MG tablet ?Commonly known as: DELTASONE ?Take 10 mg by mouth in the morning. ?  ?Preparation H 0.25-14-74.9 % rectal ointment ?Generic drug:  phenylephrine-shark liver oil-mineral oil-petrolatum ?Place 1 application. rectally in the morning, at noon, in the evening, and at bedtime. ?  ?tacrolimus 1 MG capsule ?Commonly known as: PROGRAF ?Take 0.5 mg by mouth See admin instructions. Take 0.'5mg'$  bid with 1 mg for a total of 1.'5mg'$  ,'1mg'$  twice daily ?  ?traMADol 50 MG tablet ?Commonly known as: ULTRAM ?Take 1 tablet by mouth 3 times a day as needed ?  ? ?  ? ?Allergies  ?Allergen Reactions  ? Cephalexin Hives  ? Augmentin [Amoxicillin-Pot Clavulanate] Nausea And Vomiting  ? Doxycycline Nausea And Vomiting  ? Minocycline Hives and Other (See Comments)  ?  Gave bladder infection   ? ? ? ? ?The results of significant diagnostics from this hospitalization (including imaging, microbiology, ancillary and laboratory) are listed below for reference.   ? ?Significant Diagnostic Studies: ?CT ABDOMEN PELVIS WO CONTRAST ? ?Result Date: 08/16/2021 ?CLINICAL DATA:  Abdominal pain. EXAM: CT ABDOMEN AND PELVIS WITHOUT CONTRAST TECHNIQUE: Multidetector CT imaging of the abdomen and pelvis was performed following the standard protocol without IV contrast. RADIATION DOSE REDUCTION: This exam was performed according to the departmental dose-optimization program which includes automated exposure control, adjustment of the mA and/or kV according to patient size and/or use of iterative reconstruction technique. COMPARISON:  CT of the abdomen and pelvis on 03/16/2021 FINDINGS: Lower chest: Scarring/atelectasis at the left lung base. Less prominent trace pericardial fluid compared to the prior study. Hepatobiliary: No focal liver abnormality is seen. No gallstones, gallbladder wall thickening, or biliary dilatation. Pancreas: Unremarkable. No pancreatic ductal dilatation or surrounding inflammatory changes. Spleen: Normal in size without focal abnormality. Adrenals/Urinary Tract: No adrenal masses. Stable atrophic native kidneys. Right iliac fossa renal transplant again noted with  resolved transplant hydronephrosis since the prior study. Bladder is nondistended. Stomach/Bowel: Bowel shows no evidence of obstruction, ileus, inflammation or lesion. No free intraperitoneal air. Vascular/Lymphatic: Atherosclerosis of the abdominal aorta without aneurysm. No enlarged lymph nodes identified. Reproductive: Status post hysterectomy. No adnexal masses. Other: No abdominal wall hernia or abnormality. No abdominopelvic ascites. Musculoskeletal: Stable old left pubic rami fractures. No acute findings. IMPRESSION: No acute findings in the abdomen or pelvis. Since the prior study, renal transplant hydronephrosis has resolved and the bladder is no longer significantly distended. Electronically Signed   By: Aletta Edouard M.D.   On: 08/16/2021 17:14  ? ?CT Head Wo Contrast ? ?Result Date: 08/16/2021 ?CLINICAL DATA:  Headache.  Mental status changes.  Breast cancer. EXAM: CT HEAD WITHOUT CONTRAST TECHNIQUE: Contiguous axial images were obtained from the base of the skull through the vertex without intravenous contrast. RADIATION DOSE REDUCTION: This exam was performed according to the departmental dose-optimization program which includes automated exposure control, adjustment of the mA and/or kV according to patient size and/or use of iterative reconstruction technique. COMPARISON:  02/22/2021 FINDINGS: Brain: Moderate low density in the periventricular white matter likely related to small vessel disease. No mass lesion, hemorrhage, hydrocephalus, acute infarct, intra-axial, or extra-axial fluid collection. Vascular: No hyperdense vessel or unexpected calcification. Intracranial atherosclerosis. Skull: Normal Sinuses/Orbits: Normal imaged  portions of the orbits and globes. Hypoplastic left frontal sinus. Clear paranasal sinuses and mastoid air cells. Other: None. IMPRESSION: 1. No acute intracranial abnormality. 2. Moderate small vessel ischemic change. Electronically Signed   By: Abigail Miyamoto M.D.   On:  08/16/2021 11:57  ? ?MR BRAIN WO CONTRAST ? ?Result Date: 08/17/2021 ?CLINICAL DATA:  Altered mental status EXAM: MRI HEAD WITHOUT CONTRAST TECHNIQUE: Multiplanar, multiecho pulse sequences of the brain and surrounding str

## 2021-08-17 NOTE — H&P (Signed)
?History and Physical  ? ? ?[name] DXI:[id number] DOB: [birth year] DOA: 08/16/2021 ? ?PCP: Deland Pretty, MD  ?Patient coming from: Nursing facility. ? ?Chief Complaint: Headache and change in mental status. ? ?History obtained from ER physician.  Unable to reach patient's husband. ? ?HPI: [name] is a 88 y.o. female with history of dementia who at this time lives in dementia unit, renal transplant on immunosuppressants, hypothyroidism was brought to the ER at med center with complaints of headache.  In the ER patient's husband stated that patient also looks different from her usual baseline and was looking increasingly confused.  In addition patient also was complaining of nonspecific abdominal discomfort and dizziness. ? ?ED Course: Patient was afebrile in the ER UA was unremarkable.  Since patient also had some abdominal discomfort CT abdomen and pelvis was done which was unremarkable.  CT head did not show anything acute.  Creatinine is at baseline.  Given that patient was having headache mental status changes and dizziness patient admitted for further observation and getting MRI brain. ? ?Review of Systems: As per HPI, rest all negative. ? ? ?Past Medical History:  ?Diagnosis Date  ? Arthritis   ? Cancer [facility]) 1988  ? BREAST CANCER- MASTECTOMY RIGHT - NO CHEMO NO RADIATION  ? CMV (cytomegalovirus infection) ([place])   ? Depression with anxiety   ? Dizziness   ? Gout   ? History of breast cancer   ? Hypertension   ? Hypothyroidism   ? Iron deficiency anemia   ? Memory loss   ? Osteoarthritis   ? Osteopenia   ? Patella fracture   ? Peripheral neuropathy   ? Raynauds phenomenon   ? Renal disorder   ? HX OF KIDNEY TRANSPLANT - STATES KIDNEY FUNCTION OK -DR. SANDFORD / DR. FOX -   [facility]  ? Sinus problem   ? HX OF SINUS INFECTIONS   ? Thyroid disease   ? Vitamin D deficiency   ? Wolff-Parkinson-White (WPW) syndrome   ? ? ?Past Surgical History:  ?Procedure Laterality Date  ?  ABDOMINAL HYSTERECTOMY    ? BREAST SURGERY    ? RIGHT MASTECTOMY AND AXILLARY NODE DISSECTION  ? JOINT REPLACEMENT    ? LEFT TOTAL KNEE REPLACEMENT   ? KIDNEY TRANSPLANT  march 2012  ? right side - surgery at [place]  ? ORIF PATELLA Right 07/22/2013  ? Procedure: OPEN REDUCTION INTERNAL (ORIF) FIXATION PATELLA;  Surgeon: Mauri Pole, MD;  Location: [facility];  Service: Orthopedics;  Laterality: Right;  ? SINUS SURGERY WITH INSTATRAK    ? ? ? reports that she has never smoked. She has never used smokeless tobacco. She reports that she does not currently use alcohol. She reports that she does not use drugs. ? ?Allergies  ?Allergen Reactions  ? Cephalexin Hives  ? Augmentin [Amoxicillin-Pot Clavulanate] Nausea And Vomiting  ? Doxycycline Nausea And Vomiting  ? Minocycline Hives and Other (See Comments)  ?  Gave bladder infection   ? ? ?Family History  ?Problem Relation Age of Onset  ? Other Mother   ?     unsure of history   ? Dementia Father   ? Dementia Sister   ? Cancer Brother   ?     lung  ? ? ?Prior to Admission medications   ?Medication Sig Start Date End Date Taking? Authorizing Provider  ?acetaminophen (TYLENOL) 325 MG tablet Take 325 mg by mouth as needed (pain).  Yes [provider]  ?acidophilus (RISAQUAD) CAPS capsule Take 1 capsule by mouth daily.   Yes [provider]  ?Calcium Carbonate (CALCIUM 600 PO) Take 1 tablet by mouth every evening.   Yes [provider]  ?cetirizine (ZYRTEC) 10 MG tablet Take 10 mg by mouth at bedtime.   Yes [provider]  ?ergocalciferol (VITAMIN D2) 1.25 MG (50000 UT) capsule Take 50,000 Units by mouth once a week.   Yes [provider]  ?levothyroxine (SYNTHROID, LEVOTHROID) 50 MCG tablet Take 50 mcg by mouth daily.   Yes [provider]  ?memantine (NAMENDA) 10 MG tablet Take 1 tablet (10 mg total) by mouth 2 (two) times daily. Please request future refills from PCP. 07/14/19  Yes Marcial Pacas, MD   ?phenylephrine-shark liver oil-mineral oil-petrolatum (PREPARATION H) 0.25-14-74.9 % rectal ointment Place 1 application. rectally in the morning, at noon, in the evening, and at bedtime.   Yes [provider]  ?polyethylene glycol powder (GLYCOLAX/MIRALAX) 17 GM/SCOOP powder Take 17 g by mouth daily as needed.   Yes [provider]  ?predniSONE (DELTASONE) 5 MG tablet Take 10 mg by mouth in the morning.   Yes [provider]  ?tacrolimus (PROGRAF) 1 MG capsule Take 0.5 mg by mouth See admin instructions. Take 0.'5mg'$  bid with 1 mg for a total of 1.'5mg'$  ,'1mg'$  twice daily   Yes [provider]  ?denosumab (PROLIA) 60 MG/ML SOLN injection Inject 60 mg into the skin every 6 (six) months. Administer in upper arm, thigh, or abdomen    [provider]  ?hydrocortisone (ANUSOL-HC) 25 MG suppository Unwrap and place 1 suppository rectally 2 times daily for 14 days ?Patient not taking: Reported on 08/16/2021 03/14/21     ?lidocaine (LIDODERM) 5 % Place 1 patch onto the skin daily. Remove & Discard patch within 12 hours or as directed by MD ?Patient not taking: Reported on 08/16/2021 11/10/20   Suella Broad A, PA-C  ?sertraline (ZOLOFT) 100 MG tablet TAKE 1 TABLET BY MOUTH ONCE DAILY ?Patient not taking: Reported on 08/16/2021 09/02/20     ?sertraline (ZOLOFT) 50 MG tablet Take 2 tablets (100 mg total) by mouth daily. ?Patient not taking: Reported on 08/16/2021 06/10/18   Marcial Pacas, MD  ?traMADol Veatrice Bourbon) 50 MG tablet Take 1 tablet by mouth 3 times a day as needed 03/25/21     ? ? ?Physical Exam: ?Constitutional: Moderately built and nourished. ?Vitals:  ? 08/16/21 2330 08/17/21 0000 08/17/21 0123 08/17/21 0332  ?BP: (!) 157/100 (!) 142/91 (!) 183/108 (!) 154/97  ?Pulse: 66 85 74 75  ?Resp: 13 (!) '21 16 17  '$ ?Temp:   98.3 ?F (36.8 ?C) 97.8 ?F (36.6 ?C)  ?TempSrc:   Oral Oral  ?SpO2: 96% 96% 98% 97%  ?Weight:      ?Height:      ? ?Eyes: Anicteric no pallor. ?ENMT: No discharge from the ears eyes  nose and mouth. ?Neck: No mass felt.  No neck rigidity. ?Respiratory: No rhonchi or crepitations. ?Cardiovascular: S1-S2 heard. ?Abdomen: Soft nontender bowel sound present. ?Musculoskeletal: No edema. ?Skin: No rash. ?Neurologic: Alert awake oriented to her name only.  Moving all extremities. ?Psychiatric: Has advanced dementia. ? ? ?Labs on Admission: I have personally reviewed following labs and imaging studies ? ?CBC: ?Recent Labs  ?Lab 08/16/21 ?1130  ?WBC 11.9*  ?NEUTROABS 10.4*  ?HGB 13.9  ?HCT 40.0  ?MCV 93.5  ?PLT 118*  ? ?Basic Metabolic Panel: ?Recent Labs  ?Lab 08/16/21 ?1130  ?NA  141  ?K 3.8  ?CL 104  ?CO2 25  ?GLUCOSE 124*  ?BUN 41*  ?CREATININE 1.43*  ?CALCIUM 9.4  ? ?GFR: ?Estimated Creatinine Clearance: 23.8 mL/min (A) (by C-G formula based on SCr of 1.43 mg/dL (H)). ?Liver Function Tests: ?Recent Labs  ?Lab 08/16/21 ?1130  ?AST 17  ?ALT 13  ?ALKPHOS 44  ?BILITOT 0.8  ?PROT 6.5  ?ALBUMIN 4.1  ? ?Recent Labs  ?Lab 08/16/21 ?1130  ?LIPASE 24  ? ?Recent Labs  ?Lab 08/16/21 ?1901  ?AMMONIA 36*  ? ?Coagulation Profile: ?No results for input(s): INR, PROTIME in the last 168 hours. ?Cardiac Enzymes: ?No results for input(s): CKTOTAL, CKMB, CKMBINDEX, TROPONINI in the last 168 hours. ?BNP (last 3 results) ?No results for input(s): PROBNP in the last 8760 hours. ?HbA1C: ?No results for input(s): HGBA1C in the last 72 hours. ?CBG: ?No results for input(s): GLUCAP in the last 168 hours. ?Lipid Profile: ?No results for input(s): CHOL, HDL, LDLCALC, TRIG, CHOLHDL, LDLDIRECT in the last 72 hours. ?Thyroid Function Tests: ?Recent Labs  ?  08/16/21 ?1901  ?TSH 0.439  ? ?Anemia Panel: ?Recent Labs  ?  08/16/21 ?1901  ?VITAMINB12 237  ? ?Urine analysis: ?   ?Component Value Date/Time  ? [place] YELLOW 08/16/2021 1535  ? APPEARANCEUR CLEAR 08/16/2021 1535  ? LABSPEC 1.014 08/16/2021 1535  ? PHURINE 7.0 08/16/2021 1535  ? GLUCOSEU NEGATIVE 08/16/2021 1535  ? [place] NEGATIVE 08/16/2021 1535  ? [place] NEGATIVE  08/16/2021 1535  ? [place] NEGATIVE 08/16/2021 1535  ? PROTEINUR TRACE (A) 08/16/2021 1535  ? UROBILINOGEN 0.2 07/22/2013 1110  ? NITRITE NEGATIVE 08/16/2021 1535  ? LEUKOCYTESUR NEGATIVE 08/16/2021 1535  ? ?Sepsis L

## 2021-08-17 NOTE — NC FL2 (Signed)
?[place] MEDICAID FL2 LEVEL OF CARE SCREENING TOOL  ?  ? ?IDENTIFICATION  ?Patient Name: ?[name] Birthdate: [birth year] Sex: female Admission Date (Current Location): ?08/16/2021  ?[place] and [place] Number: ? [place] ?  Facility and Address:  ?[facility], [place] [address] [place], [place] [address] ?     Provider Number: ?[id number]  ?Attending Physician Name and Address:  ?Domenic Polite, MD ? Relative Name and Phone Number:  ?  ?   ?Current Level of Care: ?Hospital Recommended Level of Care: ?[place], Memory Care Prior Approval Number: ?  ? ?Date Approved/Denied: ?  PASRR Number: ?  ? ?Discharge Plan: ?Other (Comment) (ALF/Memory Care) ?  ? ?Current Diagnoses: ?Patient Active Problem List  ? Diagnosis Date Noted  ? Acute encephalopathy 08/17/2021  ? Altered mental status 08/16/2021  ? Closed pelvic ring fracture ([place]) 06/11/2018  ? Memory loss 06/10/2018  ? Gait abnormality 06/10/2018  ? Dementia associated with other underlying disease without behavioral disturbance ([place]) 04/12/2018  ? Major depressive disorder, recurrent, mild ([place]) 04/12/2018  ? Pure hypercholesterolemia, unspecified 04/12/2018  ? Lumbar foraminal stenosis 03/27/2018  ? Facet arthropathy, lumbar 12/19/2017  ? Stenosis of lateral recess of lumbar spine 12/19/2017  ? Syncope 10/03/2017  ? History of breast cancer 10/03/2017  ? Back pain 10/03/2017  ? WPW (Wolff-Parkinson-White syndrome) 10/03/2017  ? Polyneuropathy associated with underlying disease ([place]) 06/06/2017  ? Mild cognitive impairment 04/23/2017  ? Dizziness 04/23/2017  ? Expected blood loss anemia 07/23/2013  ? S/P Right patella ORIF 07/22/2013  ? HTN (hypertension) Aug 20, 2012  ? Deceased-donor kidney transplant recipient 2012/08/20  ? Renal transplant disorder 06/07/2012  ? Unspecified hypothyroidism 06/07/2012  ? Infection, atypical mycobacterium 06/07/2012  ? ? ?Orientation RESPIRATION BLADDER Height & Weight   ?  ?Self ?  Normal Incontinent Weight: 120 lb (54.4 kg) ?Height:  '5\' 6"'$  (167.6 cm)  ?BEHAVIORAL SYMPTOMS/MOOD NEUROLOGICAL BOWEL NUTRITION STATUS  ?    Incontinent Diet (heart healthy)  ?AMBULATORY STATUS COMMUNICATION OF NEEDS Skin   ?Limited Assist Verbally Normal ?  ?  ?  ?    ?     ?     ? ? ?Personal Care Assistance Level of Assistance  ?Bathing, Feeding, Dressing Bathing Assistance: Limited assistance ?Feeding assistance: Limited assistance ?Dressing Assistance: Limited assistance ?   ? ?Functional Limitations Info  ?    ?  ?   ? ? ?SPECIAL CARE FACTORS FREQUENCY  ?    ?  ?  ?  ?  ?  ?  ?   ? ? ?Contractures Contractures Info: Not present  ? ? ?Additional Factors Info  ?Code Status, Allergies Code Status Info: DNR ?Allergies Info: Cephalexin, Augmentin (Amoxicillin-pot Clavulanate), Doxycycline, Minocycline ?  ?  ?  ?   ? ?Current Medications (08/17/2021):  This is the current hospital active medication list ?Current Facility-Administered Medications  ?Medication Dose Route Frequency Provider Last Rate Last Admin  ? acetaminophen (TYLENOL) tablet 650 mg  650 mg Oral Q6H PRN Rise Patience, MD      ? Or  ? acetaminophen (TYLENOL) suppository 650 mg  650 mg Rectal Q6H PRN Rise Patience, MD      ? enoxaparin (LOVENOX) injection 30 mg  30 mg Subcutaneous Q24H Rise Patience, MD      ? levothyroxine (SYNTHROID) tablet 50 mcg  50 mcg Oral Q0600 Rise Patience, MD   50 mcg at 08/17/21 5643  ? memantine (NAMENDA) tablet 10  mg  10 mg Oral BID Rise Patience, MD   10 mg at 08/17/21 5027  ? predniSONE (DELTASONE) tablet 10 mg  10 mg Oral Q breakfast Rise Patience, MD   10 mg at 08/17/21 7412  ? tacrolimus (PROGRAF) capsule 1.5 mg  1.5 mg Oral BID Reome, Earle J, RPH      ? ? ? ?Discharge Medications: ?STOP taking these medications   ?  ?denosumab 60 MG/ML Soln injection ?Commonly known as: PROLIA ?   ?hydrocortisone 25 MG suppository ?Commonly known as: ANUSOL-HC ?   ?lidocaine 5 % ?Commonly known  as: Lidoderm ?   ?sertraline 100 MG tablet ?Commonly known as: ZOLOFT ?   ?sertraline 50 MG tablet ?Commonly known as: ZOLOFT ?   ?  ?   ?  ?TAKE these medications   ?  ?acetaminophen 325 MG tablet ?Commonly known as: TYLENOL ?Take 325 mg by mouth as needed (pain). ?   ?acidophilus Caps capsule ?Take 1 capsule by mouth daily. ?   ?CALCIUM 600 PO ?Take 1 tablet by mouth every evening. ?   ?cetirizine 10 MG tablet ?Commonly known as: ZYRTEC ?Take 10 mg by mouth at bedtime. ?   ?ergocalciferol 1.25 MG (50000 UT) capsule ?Commonly known as: VITAMIN D2 ?Take 50,000 Units by mouth once a week. ?   ?levothyroxine 50 MCG tablet ?Commonly known as: SYNTHROID ?Take 50 mcg by mouth daily. ?   ?memantine 10 MG tablet ?Commonly known as: NAMENDA ?Take 1 tablet (10 mg total) by mouth 2 (two) times daily. Please request future refills from PCP. ?   ?polyethylene glycol powder 17 GM/SCOOP powder ?Commonly known as: GLYCOLAX/MIRALAX ?Take 17 g by mouth daily as needed. ?   ?predniSONE 5 MG tablet ?Commonly known as: DELTASONE ?Take 10 mg by mouth in the morning. ?   ?Preparation H 0.25-14-74.9 % rectal ointment ?Generic drug: phenylephrine-shark liver oil-mineral oil-petrolatum ?Place 1 application. rectally in the morning, at noon, in the evening, and at bedtime. ?   ?tacrolimus 1 MG capsule ?Commonly known as: PROGRAF ?Take 0.5 mg by mouth See admin instructions. Take 0.'5mg'$  bid with 1 mg for a total of 1.'5mg'$  ,'1mg'$  twice daily ?   ?traMADol 50 MG tablet ?Commonly known as: ULTRAM ?Take 1 tablet by mouth 3 times a day as needed  ? ? ?Relevant Imaging Results: ? ?Relevant Lab Results: ? ? ?Additional Information ?SS#: [id number] ? ?Geralynn Ochs, LCSW ? ? ? ? ?

## 2021-08-17 NOTE — TOC Transition Note (Signed)
Transition of Care (TOC) - CM/SW Discharge Note ? ? ?Patient Details  ?Name: [name] ?MRN: [id number] ?Date of Birth: 1931/11/26 ? ?Transition of Care [facility]) CM/SW Contact:  ?Geralynn Ochs, LCSW ?Phone Number: ?08/17/2021, 2:50 PM ? ? ?Clinical Narrative:   CSW alerted that patient is medically stable to return to [facility]. CSW spoke with [facility] to discuss where patient was from, and patient is from assisted living memory care. CSW completed paperwork and sent to [facility] for review, confirmed patient can return. Spouse at bedside to provide transportation.  ? ?Nurse to call report to [phone]. ? ? ? ?Final next level of care: Assisted Living ?Barriers to Discharge: Barriers Resolved ? ? ?Patient Goals and CMS Choice ?  ?  ?  ? ?Discharge Placement ?  ?           ?Patient chooses bed at: [facility] at [facility] ?Patient to be transferred to facility by: Spouse ?Name of family member notified: Spouse ?Patient and family notified of of transfer: 08/17/21 ? ?Discharge Plan and Services ?  ?  ?           ?  ?  ?  ?  ?  ?  ?  ?  ?  ?  ? ?Social Determinants of Health (SDOH) Interventions ?  ? ? ?Readmission Risk Interventions ?   ? View : No data to display.  ?  ?  ?  ? ? ? ? ? ?

## 2021-08-17 NOTE — Plan of Care (Signed)

## 2021-08-17 NOTE — Progress Notes (Signed)
The patient is admitted to [address] x 2. Unable to completed admission profile due her confusion. Will be done when the family gets to the bedside. Will continue to monitor. ?

## 2021-08-31 DIAGNOSIS — F0390 Unspecified dementia without behavioral disturbance: Secondary | ICD-10-CM | POA: Diagnosis not present

## 2021-08-31 DIAGNOSIS — E039 Hypothyroidism, unspecified: Secondary | ICD-10-CM | POA: Diagnosis not present

## 2021-08-31 DIAGNOSIS — Z7189 Other specified counseling: Secondary | ICD-10-CM | POA: Diagnosis not present

## 2021-08-31 DIAGNOSIS — K7460 Unspecified cirrhosis of liver: Secondary | ICD-10-CM | POA: Diagnosis not present

## 2021-08-31 DIAGNOSIS — Z940 Kidney transplant status: Secondary | ICD-10-CM | POA: Diagnosis not present

## 2021-08-31 DIAGNOSIS — E559 Vitamin D deficiency, unspecified: Secondary | ICD-10-CM | POA: Diagnosis not present

## 2021-09-30 DIAGNOSIS — L814 Other melanin hyperpigmentation: Secondary | ICD-10-CM | POA: Diagnosis not present

## 2021-09-30 DIAGNOSIS — C44722 Squamous cell carcinoma of skin of right lower limb, including hip: Secondary | ICD-10-CM | POA: Diagnosis not present

## 2021-09-30 DIAGNOSIS — D485 Neoplasm of uncertain behavior of skin: Secondary | ICD-10-CM | POA: Diagnosis not present

## 2021-09-30 DIAGNOSIS — Z85068 Personal history of other malignant neoplasm of small intestine: Secondary | ICD-10-CM | POA: Diagnosis not present

## 2021-09-30 DIAGNOSIS — L579 Skin changes due to chronic exposure to nonionizing radiation, unspecified: Secondary | ICD-10-CM | POA: Diagnosis not present

## 2021-10-11 DIAGNOSIS — Z940 Kidney transplant status: Secondary | ICD-10-CM | POA: Diagnosis not present

## 2021-10-19 DIAGNOSIS — H00014 Hordeolum externum left upper eyelid: Secondary | ICD-10-CM | POA: Diagnosis not present

## 2021-10-20 DIAGNOSIS — Z940 Kidney transplant status: Secondary | ICD-10-CM | POA: Diagnosis not present

## 2021-10-20 DIAGNOSIS — I10 Essential (primary) hypertension: Secondary | ICD-10-CM | POA: Diagnosis not present

## 2021-10-20 DIAGNOSIS — Z8619 Personal history of other infectious and parasitic diseases: Secondary | ICD-10-CM | POA: Diagnosis not present

## 2021-10-20 DIAGNOSIS — T50905A Adverse effect of unspecified drugs, medicaments and biological substances, initial encounter: Secondary | ICD-10-CM | POA: Diagnosis not present

## 2021-10-20 DIAGNOSIS — D849 Immunodeficiency, unspecified: Secondary | ICD-10-CM | POA: Diagnosis not present

## 2021-10-26 DIAGNOSIS — C4492 Squamous cell carcinoma of skin, unspecified: Secondary | ICD-10-CM | POA: Diagnosis not present

## 2021-10-26 DIAGNOSIS — Z85068 Personal history of other malignant neoplasm of small intestine: Secondary | ICD-10-CM | POA: Diagnosis not present

## 2021-10-26 DIAGNOSIS — L570 Actinic keratosis: Secondary | ICD-10-CM | POA: Diagnosis not present

## 2021-11-28 DIAGNOSIS — C44722 Squamous cell carcinoma of skin of right lower limb, including hip: Secondary | ICD-10-CM | POA: Diagnosis not present

## 2021-11-30 DIAGNOSIS — Z940 Kidney transplant status: Secondary | ICD-10-CM | POA: Diagnosis not present

## 2021-11-30 DIAGNOSIS — R413 Other amnesia: Secondary | ICD-10-CM | POA: Diagnosis not present

## 2021-11-30 DIAGNOSIS — F03911 Unspecified dementia, unspecified severity, with agitation: Secondary | ICD-10-CM | POA: Diagnosis not present

## 2021-11-30 DIAGNOSIS — E559 Vitamin D deficiency, unspecified: Secondary | ICD-10-CM | POA: Diagnosis not present

## 2021-11-30 DIAGNOSIS — F325 Major depressive disorder, single episode, in full remission: Secondary | ICD-10-CM | POA: Diagnosis not present

## 2021-11-30 DIAGNOSIS — E039 Hypothyroidism, unspecified: Secondary | ICD-10-CM | POA: Diagnosis not present

## 2021-11-30 DIAGNOSIS — Z0000 Encounter for general adult medical examination without abnormal findings: Secondary | ICD-10-CM | POA: Diagnosis not present

## 2021-12-01 DIAGNOSIS — Z0000 Encounter for general adult medical examination without abnormal findings: Secondary | ICD-10-CM | POA: Diagnosis not present

## 2021-12-21 DIAGNOSIS — Z85828 Personal history of other malignant neoplasm of skin: Secondary | ICD-10-CM | POA: Diagnosis not present

## 2021-12-21 DIAGNOSIS — C4492 Squamous cell carcinoma of skin, unspecified: Secondary | ICD-10-CM | POA: Diagnosis not present

## 2021-12-21 DIAGNOSIS — L570 Actinic keratosis: Secondary | ICD-10-CM | POA: Diagnosis not present

## 2022-01-04 DIAGNOSIS — G629 Polyneuropathy, unspecified: Secondary | ICD-10-CM | POA: Diagnosis not present

## 2022-01-04 DIAGNOSIS — N189 Chronic kidney disease, unspecified: Secondary | ICD-10-CM | POA: Diagnosis not present

## 2022-01-09 ENCOUNTER — Ambulatory Visit: Payer: Medicare PPO | Admitting: Neurology

## 2022-01-09 ENCOUNTER — Encounter: Payer: Self-pay | Admitting: Neurology

## 2022-01-09 VITALS — BP 120/79 | HR 87 | Ht 66.0 in | Wt 131.0 lb

## 2022-01-09 DIAGNOSIS — F0280 Dementia in other diseases classified elsewhere without behavioral disturbance: Secondary | ICD-10-CM

## 2022-01-09 DIAGNOSIS — R269 Unspecified abnormalities of gait and mobility: Secondary | ICD-10-CM

## 2022-01-09 NOTE — Progress Notes (Signed)
Chief Complaint  Patient presents with   New Patient (Initial Visit)    Rm 15. Accompanied by husband, John and Nurse, learning disability, Social research officer, government. NX Dr. Orson Aloe 2020/proficient paper referral for worsening dementia, word salad, jerking hand movements/Mary Adria Dill, NP [facility]  Dailin Sosnowski is a 88 y.o. female   Advanced dementia with behavior issues Gait abnormality Low vitamin B12  Patient is under excellent care, living memory unit at [facility], her husband retired Engineer, water Dr. Oletta Lamas has clear understanding of the situation  Patient with advanced dementia tends to have fluctuation of functional status, advised family and the facility staff to avoid dehydration, constipation,  Suggest B12 supplement 1000 mcg daily  Continue moderate exercise  Only return to clinic as needed   DIAGNOSTIC DATA (LABS, IMAGING, TESTING) - I reviewed patient records, labs, notes, testing and imaging myself where available.  MRI of the brain on Aug 17, 2021 1. No acute intracranial pathology. 2. Unchanged global parenchymal volume loss and moderate chronic white matter microangiopathy.  CT of abdomen and pelvis without contrast Aug 16, 2021, no acute abnormality  Laboratory evaluations in May 2023, normal CBC, CMP creatinine 1.64, B12 237, ammonia was elevated 36, normal TSH, UA was nonremarkable   Laboratory evaluation from [facility] December 01, 2021 B12 334, normal TSH, vitamin D 45.5,  MEDICAL HISTORY:  Francesa Eugenio, is a 88 year old female seen in request by her primary care doctor Deland Pretty, for evaluation of worsening dementia, is accompanied by her husband, retired psychiatrist Dr. Oletta Lamas and [facility] staff at today's visit January 09, 2022  Past medical history Hypothyroidism, on supplement Breast cancer Lipidemia Pretension History of kidney transplant secondary chronic kidney disease in 2012 taking  prednisone 5 mg daily, Prograf 0.5 mg Iron deficiency anemia MAEBELLE SULTON is a 88 year old female, seen in refer by her primary care doctor Deland Pretty, for evaluation of memory loss, dizziness, initial evaluation was on April 23, 2017.  I began to note this patient since 2019, at that time, she was able to drive to clinic herself, is a retired Scientist, research (medical) at age 32, lives at [facility] assisted living, significant family history of memory loss, father and 2 elder sister suffer dementia Over the next few follow-up, she was noted to have slow worsening memory loss,  CT head and MRI of the brain in 2019 showed generalized atrophy mild small vessel disease  EMG nerve conduction study in February 2019 showed mild axonal length dependent sensorimotor polyneuropathy  Initially she was seen for dizziness, thought due to dehydration, much improved  Over the past few years, her memory loss progress relentlessly, she is now at the memory unit of [facility], needing help in dressing, tolerating, bathing, no agitation,  Hospital admission in May 2023 for increased confusion,  Review of MRI of the brain May 2023 in compared to previous MRI in 2019, progression of generalized atrophy, moderate periventricular small vessel disease  Laboratory evaluation showed normal TSH, low normal B12 227   PHYSICAL EXAM:   Vitals:   01/09/22 0914  BP: 120/79  Pulse: 87  Weight: 131 lb (59.4 kg)  Height: '5\' 6"'$  (1.676 m)   Not recorded     Body mass index is 21.14 kg/m.  PHYSICAL EXAMNIATION:  Gen: NAD, conversant, well nourised, well groomed                     Cardiovascular: Regular  rate rhythm, no peripheral edema, warm, nontender. Eyes: Conjunctivae clear without exudates or hemorrhage Neck: Supple, no carotid bruits. Pulmonary: Clear to auscultation bilaterally   NEUROLOGICAL EXAM:  MENTAL STATUS: Speech/cognition: Awake, pleasant, interactive with people,    01/09/2022     9:15 AM  Montreal Cognitive Assessment   Visuospatial/ Executive (0/5) 0  Naming (0/3) 2  Attention: Read list of digits (0/2) 1  Attention: Read list of letters (0/1) 0  Attention: Serial 7 subtraction starting at 100 (0/3) 1  Language: Repeat phrase (0/2) 1  Language : Fluency (0/1) 0  Abstraction (0/2) 2  Delayed Recall (0/5) 0  Orientation (0/6) 1  Total 8  Adjusted Score (based on education) 8    CRANIAL NERVES: CN II:  Pupils are round equal and briskly reactive to light. CN III, IV, VI: extraocular movement are normal. No ptosis. CN V: Facial sensation is intact to light touch CN VII: Face is symmetric with normal eye closure  CN VIII: Hearing is normal to causal conversation. CN IX, X: Phonation is normal. CN XI: Head turning and shoulder shrug are intact  MOTOR: Moving 4 extremities without difficulty  REFLEXES: Reflexes are 1 and symmetric at the biceps, triceps, knees, and ankles.   SENSORY: Withdraw to pain  COORDINATION: There is no trunk or limb dysmetria noted.  GAIT/STANCE: Rely on her walker, cautious  REVIEW OF SYSTEMS:  Full 14 system review of systems performed and notable only for as above All other review of systems were negative.   ALLERGIES: Allergies  Allergen Reactions   Cephalexin Hives   Augmentin [Amoxicillin-Pot Clavulanate] Nausea And Vomiting   Doxycycline Nausea And Vomiting   Minocycline Hives and Other (See Comments)    Gave bladder infection    Promethazine     Other reaction(s): confusion    HOME MEDICATIONS: Current Outpatient Medications  Medication Sig Dispense Refill   acetaminophen (TYLENOL) 325 MG tablet Take 325 mg by mouth as needed (pain).      acidophilus (RISAQUAD) CAPS capsule Take 1 capsule by mouth daily.     Calcium Carbonate (CALCIUM 600 PO) Take 1 tablet by mouth every evening.     cetirizine (ZYRTEC) 10 MG tablet Take 10 mg by mouth at bedtime.     Cholecalciferol (VITAMIN D3) 1.25 MG (50000 UT) CAPS  Take 1 capsule by mouth once a week.     levothyroxine (SYNTHROID, LEVOTHROID) 50 MCG tablet Take 50 mcg by mouth daily.     memantine (NAMENDA) 10 MG tablet Take 1 tablet (10 mg total) by mouth 2 (two) times daily. Please request future refills from PCP. 60 tablet 3   phenylephrine-shark liver oil-mineral oil-petrolatum (PREPARATION H) 0.25-14-74.9 % rectal ointment Place 1 application. rectally in the morning, at noon, in the evening, and at bedtime.     predniSONE (DELTASONE) 5 MG tablet Take 10 mg by mouth in the morning.     tacrolimus (PROGRAF) 1 MG capsule Take 0.5 mg by mouth See admin instructions. Take 0.'5mg'$  bid with 1 mg for a total of 1.'5mg'$  ,'1mg'$  twice daily     traMADol (ULTRAM) 50 MG tablet Take 1 tablet by mouth 3 times a day as needed 60 tablet 2   No current facility-administered medications for this visit.    PAST MEDICAL HISTORY: Past Medical History:  Diagnosis Date   Arthritis    Cancer ([place]) 1988   BREAST CANCER- MASTECTOMY RIGHT - NO CHEMO NO RADIATION   CMV (cytomegalovirus infection) ([place])  Depression with anxiety    Dizziness    Gout    History of breast cancer    Hypertension    Hypothyroidism    Iron deficiency anemia    Memory loss    Osteoarthritis    Osteopenia    Patella fracture    Peripheral neuropathy    Raynauds phenomenon    Renal disorder    HX OF KIDNEY TRANSPLANT - STATES KIDNEY FUNCTION OK -DR. SANDFORD / DR. FOX -   [facility]   Sinus problem    HX OF SINUS INFECTIONS    Thyroid disease    Vitamin D deficiency    Wolff-Parkinson-White (WPW) syndrome     PAST SURGICAL HISTORY: Past Surgical History:  Procedure Laterality Date   ABDOMINAL HYSTERECTOMY     BREAST SURGERY     RIGHT MASTECTOMY AND AXILLARY NODE DISSECTION   JOINT REPLACEMENT     LEFT TOTAL KNEE REPLACEMENT    KIDNEY TRANSPLANT  march 2012   right side - surgery at [facility]   ORIF PATELLA Right 07/22/2013   Procedure: OPEN REDUCTION INTERNAL  (ORIF) FIXATION PATELLA;  Surgeon: Mauri Pole, MD;  Location: [facility];  Service: Orthopedics;  Laterality: Right;   SINUS SURGERY WITH INSTATRAK      FAMILY HISTORY: Family History  Problem Relation Age of Onset   Other Mother        unsure of history    Dementia Father    Dementia Sister    Cancer Brother        lung    SOCIAL HISTORY: Social History   Socioeconomic History   Marital status: Married    Spouse name: Not on file   Number of children: 2   Years of education: college   Highest education level: Bachelor's degree (e.g., BA, AB, BS)  Occupational History   Occupation: Retired  Tobacco Use   Smoking status: Never   Smokeless tobacco: Never  Vaping Use   Vaping Use: Never used  Substance and Sexual Activity   Alcohol use: Not Currently   Drug use: No   Sexual activity: Not on file  Other Topics Concern   Not on file  Social History Narrative   Lives at home with husband at [facility].   Right-handed.   Occasional caffeine use.   Social Determinants of Health   Financial Resource Strain: Not on file  Food Insecurity: Not on file  Transportation Needs: Not on file  Physical Activity: Not on file  Stress: Not on file  Social Connections: Not on file  Intimate Partner Violence: Not on file      Marcial Pacas, M.D. Ph.D.  [facility] [address], [place], [place] [address] Ph: [phone] Fax: [phone]  CC:  Deland Pretty, MD [place],  [place] [address]  Deland Pretty, MD

## 2022-01-18 DIAGNOSIS — L814 Other melanin hyperpigmentation: Secondary | ICD-10-CM | POA: Diagnosis not present

## 2022-01-18 DIAGNOSIS — L570 Actinic keratosis: Secondary | ICD-10-CM | POA: Diagnosis not present

## 2022-01-18 DIAGNOSIS — D692 Other nonthrombocytopenic purpura: Secondary | ICD-10-CM | POA: Diagnosis not present

## 2022-01-18 DIAGNOSIS — Z85828 Personal history of other malignant neoplasm of skin: Secondary | ICD-10-CM | POA: Diagnosis not present

## 2022-01-18 DIAGNOSIS — L821 Other seborrheic keratosis: Secondary | ICD-10-CM | POA: Diagnosis not present

## 2022-01-23 DIAGNOSIS — I10 Essential (primary) hypertension: Secondary | ICD-10-CM | POA: Diagnosis not present

## 2022-01-23 DIAGNOSIS — T50905A Adverse effect of unspecified drugs, medicaments and biological substances, initial encounter: Secondary | ICD-10-CM | POA: Diagnosis not present

## 2022-01-23 DIAGNOSIS — Z940 Kidney transplant status: Secondary | ICD-10-CM | POA: Diagnosis not present

## 2022-01-23 DIAGNOSIS — D849 Immunodeficiency, unspecified: Secondary | ICD-10-CM | POA: Diagnosis not present

## 2022-01-23 DIAGNOSIS — Z8619 Personal history of other infectious and parasitic diseases: Secondary | ICD-10-CM | POA: Diagnosis not present

## 2022-02-04 ENCOUNTER — Emergency Department (HOSPITAL_BASED_OUTPATIENT_CLINIC_OR_DEPARTMENT_OTHER): Payer: Medicare PPO

## 2022-02-04 ENCOUNTER — Emergency Department (HOSPITAL_BASED_OUTPATIENT_CLINIC_OR_DEPARTMENT_OTHER)
Admission: EM | Admit: 2022-02-04 | Discharge: 2022-02-04 | Disposition: A | Discharge status: Home / Self Care | Payer: Medicare PPO | Attending: Emergency Medicine | Admitting: Emergency Medicine

## 2022-02-04 ENCOUNTER — Encounter (HOSPITAL_BASED_OUTPATIENT_CLINIC_OR_DEPARTMENT_OTHER): Payer: Self-pay | Admitting: Pediatrics

## 2022-02-04 ENCOUNTER — Other Ambulatory Visit: Payer: Self-pay

## 2022-02-04 DIAGNOSIS — R609 Edema, unspecified: Secondary | ICD-10-CM | POA: Diagnosis not present

## 2022-02-04 DIAGNOSIS — I82412 Acute embolism and thrombosis of left femoral vein: Secondary | ICD-10-CM

## 2022-02-04 DIAGNOSIS — I824Z2 Acute embolism and thrombosis of unspecified deep veins of left distal lower extremity: Secondary | ICD-10-CM | POA: Diagnosis not present

## 2022-02-04 DIAGNOSIS — E039 Hypothyroidism, unspecified: Secondary | ICD-10-CM | POA: Insufficient documentation

## 2022-02-04 DIAGNOSIS — Z7901 Long term (current) use of anticoagulants: Secondary | ICD-10-CM | POA: Diagnosis not present

## 2022-02-04 DIAGNOSIS — F0390 Unspecified dementia without behavioral disturbance: Secondary | ICD-10-CM | POA: Insufficient documentation

## 2022-02-04 DIAGNOSIS — Z853 Personal history of malignant neoplasm of breast: Secondary | ICD-10-CM | POA: Diagnosis not present

## 2022-02-04 DIAGNOSIS — Z79899 Other long term (current) drug therapy: Secondary | ICD-10-CM | POA: Insufficient documentation

## 2022-02-04 DIAGNOSIS — N189 Chronic kidney disease, unspecified: Secondary | ICD-10-CM | POA: Insufficient documentation

## 2022-02-04 DIAGNOSIS — M7989 Other specified soft tissue disorders: Secondary | ICD-10-CM | POA: Diagnosis present

## 2022-02-04 DIAGNOSIS — Z992 Dependence on renal dialysis: Secondary | ICD-10-CM | POA: Diagnosis not present

## 2022-02-04 DIAGNOSIS — Z940 Kidney transplant status: Secondary | ICD-10-CM | POA: Diagnosis not present

## 2022-02-04 DIAGNOSIS — I10 Essential (primary) hypertension: Secondary | ICD-10-CM | POA: Diagnosis not present

## 2022-02-04 LAB — BASIC METABOLIC PANEL
Anion gap: 5 (ref 5–15)
BUN: 40 mg/dL — ABNORMAL HIGH (ref 8–23)
CO2: 25 mmol/L (ref 22–32)
Calcium: 8.9 mg/dL (ref 8.9–10.3)
Chloride: 111 mmol/L (ref 98–111)
Creatinine, Ser: 1.5 mg/dL — ABNORMAL HIGH (ref 0.44–1.00)
GFR, Estimated: 33 mL/min — ABNORMAL LOW (ref >60)
Glucose, Bld: 113 mg/dL — ABNORMAL HIGH (ref 70–99)
Potassium: 5.1 mmol/L (ref 3.5–5.1)
Sodium: 141 mmol/L (ref 135–145)

## 2022-02-04 LAB — CBC WITH DIFFERENTIAL/PLATELET
Abs Immature Granulocytes: 0.15 10*3/uL — ABNORMAL HIGH (ref 0.00–0.07)
Basophils Absolute: 0 10*3/uL (ref 0.0–0.1)
Basophils Relative: 0 %
Eosinophils Absolute: 0 10*3/uL (ref 0.0–0.5)
Eosinophils Relative: 0 %
HCT: 36 % (ref 36.0–46.0)
Hemoglobin: 11.8 g/dL — ABNORMAL LOW (ref 12.0–15.0)
Immature Granulocytes: 2 %
Lymphocytes Relative: 17 %
Lymphs Abs: 1.5 10*3/uL (ref 0.7–4.0)
MCH: 31.6 pg (ref 26.0–34.0)
MCHC: 32.8 g/dL (ref 30.0–36.0)
MCV: 96.5 fL (ref 80.0–100.0)
Monocytes Absolute: 0.4 10*3/uL (ref 0.1–1.0)
Monocytes Relative: 5 %
Neutro Abs: 6.4 10*3/uL (ref 1.7–7.7)
Neutrophils Relative %: 76 %
Platelets: 120 10*3/uL — ABNORMAL LOW (ref 150–400)
RBC: 3.73 MIL/uL — ABNORMAL LOW (ref 3.87–5.11)
RDW: 15 % (ref 11.5–15.5)
WBC: 8.4 10*3/uL (ref 4.0–10.5)
nRBC: 0 % (ref 0.0–0.2)

## 2022-02-04 MED ORDER — APIXABAN (ELIQUIS) VTE STARTER PACK (10MG AND 5MG): ORAL_TABLET | ORAL | 0 refills | Status: DC

## 2022-02-04 NOTE — Discharge Instructions (Addendum)
You were seen in the emergency department for your leg swelling.  Your ultrasound does show that you have a large blood clot in your left leg that is causing the swelling.  I have given you prescription for anticoagulation and you should take this as prescribed.  You usually need to be on anticoagulation for a minimum of 3 months so you need to follow-up with your primary doctor within the next few days to have your symptoms rechecked and for refills of your blood thinners.  You should return to the emergency department if you develop chest pain, shortness of breath, you pass out, you fall and hit your head, you have significantly worsening leg pain or if you have any other new or concerning symptoms.

## 2022-02-04 NOTE — ED Provider Notes (Signed)
[facility] EMERGENCY DEPARTMENT Provider Note   CSN: [id number] Arrival date & time: 02/04/22  1615     History  Chief Complaint  Patient presents with   Leg Swelling    [name] is a 88 y.o. female.  Patient is an 88 year old female with a past medical history of dementia, renal failure status post kidney transplant no longer on dialysis, hypothyroidism, WPW and prior breast cancer several years ago status postmastectomy presenting to the emergency department with left lower extremity swelling.  Patient is here with her husband who states that the nursing home called him today stating they noticed swelling in her left leg over the last day which prompted them to bring her to the emergency department.  He denies any trauma or falls.  Patient denies any chest pain or shortness of breath.  He denies any recent hospitalizations or surgeries, recent long travels in the car or plane, active cancer or hormone use.  He denies any prior history of blood clots or previous blood thinner use.  States that the patient walks with a walker and is carefully watched at the nursing home making her low fall risk.  The history is provided by the spouse. History limited by: Dementia.       Home Medications Prior to Admission medications   Medication Sig Start Date End Date Taking? Authorizing Provider  APIXABAN (ELIQUIS) VTE STARTER PACK ('10MG'$  AND '5MG'$ ) Take as directed on package: start with two-'5mg'$  tablets twice daily for 7 days. On day 8, switch to one-'5mg'$  tablet twice daily. 02/04/22  Yes Leanord Asal K, DO  acetaminophen (TYLENOL) 325 MG tablet Take 325 mg by mouth as needed (pain).     [provider]  acidophilus (RISAQUAD) CAPS capsule Take 1 capsule by mouth daily.    [provider]  Calcium Carbonate (CALCIUM 600 PO) Take 1 tablet by mouth every evening.    [provider]  cetirizine (ZYRTEC) 10 MG tablet Take 10 mg by mouth at bedtime.     [provider]  Cholecalciferol (VITAMIN D3) 1.25 MG (50000 UT) CAPS Take 1 capsule by mouth once a week.    [provider]  levothyroxine (SYNTHROID, LEVOTHROID) 50 MCG tablet Take 50 mcg by mouth daily.    [provider]  memantine (NAMENDA) 10 MG tablet Take 1 tablet (10 mg total) by mouth 2 (two) times daily. Please request future refills from PCP. 07/14/19   Marcial Pacas, MD  phenylephrine-shark liver oil-mineral oil-petrolatum (PREPARATION H) 0.25-14-74.9 % rectal ointment Place 1 application. rectally in the morning, at noon, in the evening, and at bedtime.    [provider]  predniSONE (DELTASONE) 5 MG tablet Take 10 mg by mouth in the morning.    [provider]  tacrolimus (PROGRAF) 1 MG capsule Take 0.5 mg by mouth See admin instructions. Take 0.'5mg'$  bid with 1 mg for a total of 1.'5mg'$  ,'1mg'$  twice daily    [provider]  traMADol (ULTRAM) 50 MG tablet Take 1 tablet by mouth 3 times a day as needed 03/25/21         Allergies    Cephalexin, Augmentin [amoxicillin-pot clavulanate], Doxycycline, Minocycline, and Promethazine    Review of Systems   Review of Systems  Physical Exam Updated Vital Signs BP (!) 168/96   Pulse 87   Temp 98.2 F (36.8 C) (Oral)   Resp 18   Ht '5\' 6"'$  (1.676 m)   Wt 60.3 kg   SpO2 100%  BMI 21.47 kg/m  Physical Exam Vitals and nursing note reviewed.  Constitutional:      General: She is not in acute distress.    Appearance: Normal appearance.  HENT:     Head: Normocephalic and atraumatic.     Nose: Nose normal.     Mouth/Throat:     Mouth: Mucous membranes are moist.     Pharynx: Oropharynx is clear.  Eyes:     Extraocular Movements: Extraocular movements intact.     Conjunctiva/sclera: Conjunctivae normal.  Cardiovascular:     Rate and Rhythm: Normal rate and regular rhythm.     Pulses: Normal pulses.     Heart sounds: Normal heart sounds.  Pulmonary:     Effort: Pulmonary effort is  normal.     Breath sounds: Normal breath sounds.  Abdominal:     General: Abdomen is flat.     Palpations: Abdomen is soft.     Tenderness: There is no abdominal tenderness.  Musculoskeletal:        General: Normal range of motion.     Cervical back: Normal range of motion and neck supple.     Left lower leg: Edema (2+ with entire left lower extremity) present.  Skin:    General: Skin is warm and dry.     Comments: Chronic skin changes of bilateral lower extremities  Neurological:     General: No focal deficit present.     Mental Status: She is alert. Mental status is at baseline.     Comments: Oriented to person and place  Psychiatric:        Mood and Affect: Mood normal.        Behavior: Behavior normal.     ED Results / Procedures / Treatments   Labs (all labs ordered are listed, but only abnormal results are displayed) Labs Reviewed  CBC WITH DIFFERENTIAL/PLATELET - Abnormal; Notable for the following components:      Result Value   RBC 3.73 (*)    Hemoglobin 11.8 (*)    Platelets 120 (*)    Abs Immature Granulocytes 0.15 (*)    All other components within normal limits  BASIC METABOLIC PANEL - Abnormal; Notable for the following components:   Glucose, Bld 113 (*)    BUN 40 (*)    Creatinine, Ser 1.50 (*)    GFR, Estimated 33 (*)    All other components within normal limits    EKG None  Radiology US Venous Img Lower Unilateral Left  Result Date: 02/04/2022 CLINICAL DATA:  Left leg swelling and redness EXAM: Left LOWER EXTREMITY VENOUS DOPPLER ULTRASOUND TECHNIQUE: Gray-scale sonography with compression, as well as color and duplex ultrasound, were performed to evaluate the deep venous system(s) from the level of the common femoral vein through the popliteal and proximal calf veins. COMPARISON:  None Available. FINDINGS: VENOUS There is extensive thrombus in the left lower extremity extending from the left common femoral vein to the left peroneal vein. There is  evidence of thrombus on both grayscale and color Doppler evaluation in left common femoral vein, at the saphenofemoral junction, the profunda femoral vein, the femoral vein. The calf veins were poorly visualized on grayscale imaging, but the popliteal vein and peroneal vein demonstrate thrombus on color Doppler evaluation. There is no evidence of thrombus in the posterior tibial vein. There is also soft tissue edema at the level of the calf. No discrete fluid collection is visualized. Limited views of the contralateral common femoral vein are unremarkable. OTHER  None. Limitations: none IMPRESSION: Extensive deep venous thrombosis in the left lower extremity involving both the leg and calf veins, as described above. Electronically Signed   By: Marin Roberts M.D.   On: 02/04/2022 18:19    Procedures Procedures    Medications Ordered in ED Medications - No data to display  ED Course/ Medical Decision Making/ A&P Clinical Course as of 02/04/22 2009  Sat Feb 04, 2022  1952 Labs with mild anemia, thrombocytopenia at baseline. GFR 33 so will be prescribed Eliquis that does not require renal dose adjustment. She will be given primary care follow up. [VK]    Clinical Course User Index [VK] Kemper Durie, DO                           Medical Decision Making This patient presents to the ED with chief complaint(s) of left lower extremity swelling with pertinent past medical history of dementia, yesterday status post kidney transplant no longer on HD, hypothyroidism with previous breast cancer several years ago which further complicates the presenting complaint. The complaint involves an extensive differential diagnosis and also carries with it a high risk of complications and morbidity.    The differential diagnosis includes DVT, fracture dislocation unlikely as no history of trauma or fall, no shortness of breath or JVD making CHF unlikely or PE unlikely, possible kidney or liver  dysfunction  Additional history obtained: Additional history obtained from spouse Records reviewed Primary Care Documents and Nursing Home Documents  ED Course and Reassessment: Patient was initially evaluated by triage and had a left lower extremity DVT study performed that was positive for DVT to explain the swelling in her leg.  She has an extensive DVT but no evidence of PE in exam or cerulea dolens.  Labs will be performed to assess kidney function prior to initiation of anticoagulant.  Patient's husband was instructed on the risk of anticoagulant and is agreeable with the plan.  Independent labs interpretation:  The following labs were independently interpreted: at baseline  Independent visualization of imaging: - I independently visualized the following imaging with scope of interpretation limited to determining acute life threatening conditions related to emergency care: Left lower extremity DVT study, which revealed extensive DVT of the left lower extremity  Consultation: - Consulted or discussed management/test interpretation w/ external professional: N/A  Consideration for admission or further workup: Patient has no emergent conditions requiring admission or further work-up at this time and is stable for discharge with primary care follow-up Social Determinants of health: N/A    Amount and/or Complexity of Data Reviewed Labs: ordered.  Risk Prescription drug management.          Final Clinical Impression(s) / ED Diagnoses Final diagnoses:  Acute deep vein thrombosis (DVT) of femoral vein of left lower extremity ([place])    Rx / DC Orders ED Discharge Orders          Ordered    APIXABAN (ELIQUIS) VTE STARTER PACK ('10MG'$  AND '5MG'$ )        02/04/22 2004              Kemper Durie, DO 02/04/22 2009

## 2022-02-04 NOTE — ED Triage Notes (Signed)
Arrived via EMS from [facility] unit; c/o left leg swelling and pain started today;

## 2022-02-10 DIAGNOSIS — Z23 Encounter for immunization: Secondary | ICD-10-CM | POA: Diagnosis not present

## 2022-02-13 ENCOUNTER — Telehealth: Payer: Self-pay | Admitting: Neurology

## 2022-02-13 DIAGNOSIS — Z09 Encounter for follow-up examination after completed treatment for conditions other than malignant neoplasm: Secondary | ICD-10-CM | POA: Diagnosis not present

## 2022-02-13 DIAGNOSIS — K7460 Unspecified cirrhosis of liver: Secondary | ICD-10-CM | POA: Diagnosis not present

## 2022-02-13 DIAGNOSIS — R234 Changes in skin texture: Secondary | ICD-10-CM | POA: Diagnosis not present

## 2022-02-13 DIAGNOSIS — Z7952 Long term (current) use of systemic steroids: Secondary | ICD-10-CM | POA: Diagnosis not present

## 2022-02-13 DIAGNOSIS — Z940 Kidney transplant status: Secondary | ICD-10-CM | POA: Diagnosis not present

## 2022-02-13 DIAGNOSIS — S81802A Unspecified open wound, left lower leg, initial encounter: Secondary | ICD-10-CM | POA: Diagnosis not present

## 2022-02-13 DIAGNOSIS — I82402 Acute embolism and thrombosis of unspecified deep veins of left lower extremity: Secondary | ICD-10-CM | POA: Diagnosis not present

## 2022-02-13 NOTE — Telephone Encounter (Signed)
The DPR does not include patient's daughter. The patient can request medical records which have the patient's diagnosis but unfortunately we cannot return the daughter's call unless she is on the [facility].

## 2022-02-13 NOTE — Telephone Encounter (Signed)
Pt daughter is calling Webb Silversmith). Stated she needs a doctors note stating Pt had Dementia. Webb Silversmith is requesting a call back from nurse.

## 2022-02-15 ENCOUNTER — Telehealth: Payer: Self-pay | Admitting: *Deleted

## 2022-02-15 DIAGNOSIS — L858 Other specified epidermal thickening: Secondary | ICD-10-CM | POA: Diagnosis not present

## 2022-02-15 DIAGNOSIS — L821 Other seborrheic keratosis: Secondary | ICD-10-CM | POA: Diagnosis not present

## 2022-02-15 DIAGNOSIS — L570 Actinic keratosis: Secondary | ICD-10-CM | POA: Diagnosis not present

## 2022-02-15 DIAGNOSIS — C4442 Squamous cell carcinoma of skin of scalp and neck: Secondary | ICD-10-CM | POA: Diagnosis not present

## 2022-02-15 DIAGNOSIS — D492 Neoplasm of unspecified behavior of bone, soft tissue, and skin: Secondary | ICD-10-CM | POA: Diagnosis not present

## 2022-02-15 DIAGNOSIS — Z85528 Personal history of other malignant neoplasm of kidney: Secondary | ICD-10-CM | POA: Diagnosis not present

## 2022-02-15 NOTE — Telephone Encounter (Signed)
Pt daughter call has POA and on DPR needs a letter stating her mom has dementia. Daughter Shaaron Adler [phone]

## 2022-02-16 ENCOUNTER — Telehealth: Payer: Self-pay

## 2022-02-16 ENCOUNTER — Other Ambulatory Visit: Payer: Self-pay

## 2022-02-16 DIAGNOSIS — I824Y9 Acute embolism and thrombosis of unspecified deep veins of unspecified proximal lower extremity: Secondary | ICD-10-CM

## 2022-02-16 NOTE — Telephone Encounter (Signed)
Juliann Pulse with [facility] called requesting information concerning upcoming appt.  Reviewed pt's chart, returned call for clarification, two identifiers used. Explained the procedure and the reason that the pt needed to take Gas-X. Faxed a copy of the fasting ultrasound instructions to her. Confirmed understanding.

## 2022-02-16 NOTE — Telephone Encounter (Signed)
Called daughter, Webb Silversmith for clarification. She stated letter is needed for retirement accounts annual distribution. They are requiring letter stating she cannot make decisions on her own or manage finances. Webb Silversmith is HC durable POA. She'd like letter e mailed to [email or web address].  I advised will send request to Dr Krista Blue. She verbalized understanding, appreciation. Letter on MD desk for review,  signature.

## 2022-02-18 DIAGNOSIS — G319 Degenerative disease of nervous system, unspecified: Secondary | ICD-10-CM | POA: Diagnosis not present

## 2022-02-18 DIAGNOSIS — Z79899 Other long term (current) drug therapy: Secondary | ICD-10-CM | POA: Diagnosis not present

## 2022-02-18 DIAGNOSIS — S81802A Unspecified open wound, left lower leg, initial encounter: Secondary | ICD-10-CM | POA: Diagnosis not present

## 2022-02-18 DIAGNOSIS — I82432 Acute embolism and thrombosis of left popliteal vein: Secondary | ICD-10-CM | POA: Diagnosis not present

## 2022-02-18 DIAGNOSIS — F0390 Unspecified dementia without behavioral disturbance: Secondary | ICD-10-CM | POA: Diagnosis not present

## 2022-02-18 DIAGNOSIS — I82412 Acute embolism and thrombosis of left femoral vein: Secondary | ICD-10-CM | POA: Diagnosis not present

## 2022-02-18 DIAGNOSIS — R9082 White matter disease, unspecified: Secondary | ICD-10-CM | POA: Diagnosis not present

## 2022-02-20 NOTE — Telephone Encounter (Signed)
Dr. Krista Blue has signed letter and and have e-mailed as requested to the e-mail listed.

## 2022-02-21 ENCOUNTER — Ambulatory Visit (HOSPITAL_COMMUNITY)
Admission: RE | Admit: 2022-02-21 | Discharge: 2022-02-21 | Disposition: A | Discharge status: Home / Self Care | Payer: Medicare PPO | Source: Ambulatory Visit | Attending: Vascular Surgery | Admitting: Vascular Surgery

## 2022-02-21 DIAGNOSIS — I824Y9 Acute embolism and thrombosis of unspecified deep veins of unspecified proximal lower extremity: Secondary | ICD-10-CM

## 2022-02-22 ENCOUNTER — Encounter: Payer: Self-pay | Admitting: Vascular Surgery

## 2022-02-22 ENCOUNTER — Ambulatory Visit: Payer: Medicare PPO | Admitting: Vascular Surgery

## 2022-02-22 VITALS — BP 160/88 | HR 92 | Temp 98.0°F | Resp 20 | Ht 66.0 in | Wt 135.0 lb

## 2022-02-22 DIAGNOSIS — I82412 Acute embolism and thrombosis of left femoral vein: Secondary | ICD-10-CM

## 2022-02-22 NOTE — Progress Notes (Signed)
Patient ID: [name], female   DOB: [birth year], 88 y.o.   MRN: [id number]  Reason for Consult: New Patient (Initial Visit)   Referred by Holland Commons, FNP  Subjective:     HPI:  [name] is a 88 y.o. female without significant vascular history currently [facility].  Recently had left lower extremity swelling without any concomitant injury or illness was found to have extensive left lower extremity DVT and has now been initiated on Eliquis.  She has never had any history of DVT per the patient's husband.  She did not take any aspirin prior to this.  Swelling is resolving but she does have some weeping of the skin of the left lower extremity has been wearing a compressive wrap under the direction of wound care at the facility.  Past Medical History:  Diagnosis Date   Arthritis    Cancer ([place]) 1988   BREAST CANCER- MASTECTOMY RIGHT - NO CHEMO NO RADIATION   CMV (cytomegalovirus infection) ([place])    Depression with anxiety    Dizziness    Gout    History of breast cancer    Hypertension    Hypothyroidism    Iron deficiency anemia    Memory loss    Osteoarthritis    Osteopenia    Patella fracture    Peripheral neuropathy    Raynauds phenomenon    Renal disorder    HX OF KIDNEY TRANSPLANT - STATES KIDNEY FUNCTION OK -DR. SANDFORD / DR. FOX -   [facility]   Sinus problem    HX OF SINUS INFECTIONS    Thyroid disease    Vitamin D deficiency    Wolff-Parkinson-White (WPW) syndrome    Family History  Problem Relation Age of Onset   Other Mother        unsure of history    Dementia Father    Dementia Sister    Cancer Brother        lung   Past Surgical History:  Procedure Laterality Date   ABDOMINAL HYSTERECTOMY     BREAST SURGERY     RIGHT MASTECTOMY AND AXILLARY NODE DISSECTION   JOINT REPLACEMENT     LEFT TOTAL KNEE REPLACEMENT    KIDNEY TRANSPLANT  march 2012   right side - surgery at  [facility]   ORIF PATELLA Right 07/22/2013   Procedure: OPEN REDUCTION INTERNAL (ORIF) FIXATION PATELLA;  Surgeon: Mauri Pole, MD;  Location: [facility];  Service: Orthopedics;  Laterality: Right;   SINUS SURGERY WITH INSTATRAK      Short Social History:  Social History   Tobacco Use   Smoking status: Never   Smokeless tobacco: Never  Substance Use Topics   Alcohol use: Not Currently    Allergies  Allergen Reactions   Cephalexin Hives   Augmentin [Amoxicillin-Pot Clavulanate] Nausea And Vomiting   Doxycycline Nausea And Vomiting   Minocycline Hives and Other (See Comments)    Gave bladder infection    Promethazine     Other reaction(s): confusion    Current Outpatient Medications  Medication Sig Dispense Refill   acetaminophen (TYLENOL) 325 MG tablet Take 325 mg by mouth as needed (pain).      acidophilus (RISAQUAD) CAPS capsule Take 1 capsule by mouth daily.     apixaban (ELIQUIS) 5 MG TABS tablet Take 5 mg by mouth 2 (two) times daily.     Calcium Carbonate (CALCIUM 600 PO) Take  1 tablet by mouth every evening.     cetirizine (ZYRTEC) 10 MG tablet Take 10 mg by mouth at bedtime.     Cholecalciferol (VITAMIN D3) 1.25 MG (50000 UT) CAPS Take 1 capsule by mouth once a week.     levothyroxine (SYNTHROID, LEVOTHROID) 50 MCG tablet Take 50 mcg by mouth daily.     memantine (NAMENDA) 10 MG tablet Take 1 tablet (10 mg total) by mouth 2 (two) times daily. Please request future refills from PCP. 60 tablet 3   phenylephrine-shark liver oil-mineral oil-petrolatum (PREPARATION H) 0.25-14-74.9 % rectal ointment Place 1 application. rectally in the morning, at noon, in the evening, and at bedtime.     predniSONE (DELTASONE) 5 MG tablet Take 10 mg by mouth in the morning.     tacrolimus (PROGRAF) 1 MG capsule Take 0.5 mg by mouth See admin instructions. Take 0.'5mg'$  bid with 1 mg for a total of 1.'5mg'$  ,'1mg'$  twice daily     traMADol (ULTRAM) 50 MG tablet Take 1 tablet by mouth 3 times a  day as needed 60 tablet 2   No current facility-administered medications for this visit.    Review of Systems  Constitutional:  Constitutional negative. HENT: HENT negative.  Eyes: Eyes negative.  Cardiovascular: Positive for leg swelling.  GI: Gastrointestinal negative.  Musculoskeletal: Positive for leg pain.  Skin: Positive for wound.  Neurological: Neurological negative. Hematologic: Hematologic/lymphatic negative.  Psychiatric: Psychiatric negative.        Objective:  Objective   Vitals:   02/22/22 1521  BP: (!) 160/88  Pulse: 92  Resp: 20  Temp: 98 F (36.7 C)  SpO2: 98%  Weight: 135 lb (61.2 kg)  Height: '5\' 6"'$  (1.676 m)   Body mass index is 21.79 kg/m.  Physical Exam HENT:     Head: Normocephalic.     Nose: Nose normal.     Mouth/Throat:     Mouth: Mucous membranes are moist.  Eyes:     Pupils: Pupils are equal, round, and reactive to light.  Cardiovascular:     Rate and Rhythm: Normal rate.     Pulses: Normal pulses.  Pulmonary:     Effort: Pulmonary effort is normal.  Abdominal:     General: Abdomen is flat.     Palpations: Abdomen is soft.  Musculoskeletal:     Cervical back: Normal range of motion and neck supple.     Right lower leg: No edema.     Left lower leg: Edema present.  Skin:    Capillary Refill: Capillary refill takes less than 2 seconds.  Neurological:     General: No focal deficit present.     Mental Status: She is alert.  Psychiatric:        Mood and Affect: Mood normal.        Behavior: Behavior normal.        Thought Content: Thought content normal.        Judgment: Judgment normal.     Data: IVC/Iliac Findings:  +----------+------+--------+--------+    IVC    PatentThrombusComments  +----------+------+--------+--------+  IVC Prox  patent                  +----------+------+--------+--------+  IVC Mid   patent                  +----------+------+--------+--------+  IVC Distalpatent                   +----------+------+--------+--------+     +-------------------+---------+-----------+---------+-----------+--------+  CIV        RT-PatentRT-ThrombusLT-PatentLT-ThrombusComments  +-------------------+---------+-----------+---------+-----------+--------+  Common Iliac Prox   patent              patent                       +-------------------+---------+-----------+---------+-----------+--------+  Common Iliac Distal patent              patent                       +-------------------+---------+-----------+---------+-----------+--------+      +-----------------------+---------+-----------+---------+-----------+------  --+           EIV           RT-PatentRT-ThrombusLT-PatentLT-ThrombusComments  +-----------------------+---------+-----------+---------+-----------+------  --+  External Iliac Vein Mid patent              patent                        +-----------------------+---------+-----------+---------+-----------+------  --+          Summary:  IVC/Iliac: There is no evidence of thrombus involving the IVC. There is no  evidence of thrombus involving the right common iliac vein and left common  iliac vein. There is no evidence of thrombus involving the right external  iliac vein and left external  iliac vein.         Assessment/Plan:    88 year old female with extensive left lower extremity DVT now on Eliquis for the past 2 weeks.  I discussed with the patient and the caregiver and her husband the need for Eliquis for 3 months transition to baby aspirin indefinitely after that as long as tolerated well.  Compression stocking as needed for the left lower extremity and continued activity and elevation of the leg when she is recumbent.  She can follow-up with me on an as-needed basis.     Waynetta Sandy MD [facility]

## 2022-03-01 ENCOUNTER — Encounter (HOSPITAL_BASED_OUTPATIENT_CLINIC_OR_DEPARTMENT_OTHER): Payer: Medicare PPO | Attending: General Surgery | Admitting: General Surgery

## 2022-03-01 DIAGNOSIS — I82492 Acute embolism and thrombosis of other specified deep vein of left lower extremity: Secondary | ICD-10-CM | POA: Diagnosis not present

## 2022-03-01 DIAGNOSIS — I10 Essential (primary) hypertension: Secondary | ICD-10-CM | POA: Insufficient documentation

## 2022-03-01 DIAGNOSIS — L97822 Non-pressure chronic ulcer of other part of left lower leg with fat layer exposed: Secondary | ICD-10-CM | POA: Diagnosis not present

## 2022-03-01 DIAGNOSIS — Z940 Kidney transplant status: Secondary | ICD-10-CM | POA: Diagnosis not present

## 2022-03-01 DIAGNOSIS — Z7901 Long term (current) use of anticoagulants: Secondary | ICD-10-CM | POA: Insufficient documentation

## 2022-03-01 DIAGNOSIS — D84821 Immunodeficiency due to drugs: Secondary | ICD-10-CM | POA: Insufficient documentation

## 2022-03-01 DIAGNOSIS — S81802A Unspecified open wound, left lower leg, initial encounter: Secondary | ICD-10-CM | POA: Diagnosis not present

## 2022-03-01 DIAGNOSIS — F0280 Dementia in other diseases classified elsewhere without behavioral disturbance: Secondary | ICD-10-CM | POA: Insufficient documentation

## 2022-03-01 DIAGNOSIS — Z86718 Personal history of other venous thrombosis and embolism: Secondary | ICD-10-CM | POA: Insufficient documentation

## 2022-03-02 NOTE — Progress Notes (Signed)
[name], [name] ([id number]) [id number] Page 1 of 12 Visit Report for 03/01/2022 Allergy List Details Patient Name: Date of Service: [name] 03/01/2022 10:30 A M Medical Record Number: [id number] Patient Account Number: [id number] Date of Birth/Sex: Treating RN: 07-21-1931 (88 y.o. Marta Lamas Primary Care Abdallah Hern: Deland Pretty Other Clinician: Referring Kerrin Markman: Treating Kosisochukwu Burningham/Extender: Wilford Corner in Treatment: 0 Allergies Active Allergies cephalexin Reaction: hives Severity: Moderate Keflex Severity: Moderate minocycline Severity: Moderate Allergy Notes Electronic Signature(s) Signed: 03/01/2022 5:14:53 PM By: Blanche East RN Entered By: Blanche East on 03/01/2022 10:56:49 -------------------------------------------------------------------------------- Arrival Information Details Patient Name: Date of Service: Cynthia [name], [name]. 03/01/2022 10:30 A M Medical Record Number: [id number] Patient Account Number: [id number] Date of Birth/Sex: Treating RN: 1931-06-11 (88 y.o. Marta Lamas Primary Care Xiomara Sevillano: Deland Pretty Other Clinician: Referring Joshoa Shawler: Treating Loriana Samad/Extender: Wilford Corner in Treatment: 0 Visit Information Patient Arrived: [name] Time: 10:49 Accompanied By: husband, Programmer, multimedia Assistance: None Patient Identification Verified: Yes Secondary Verification Process Completed: Yes Patient Requires Transmission-Based Precautions: No Patient Has Alerts: No Electronic Signature(s) Signed: 03/01/2022 5:14:53 PM By: Blanche East RN Entered By: Blanche East on 03/01/2022 10:54:41 Monterrosa, [name] ([id number]) [id number] Page 2 of 12 -------------------------------------------------------------------------------- Clinic Level of Care Assessment Details Patient Name: Date of Service: Cynthia  Phill Mitchell 03/01/2022 10:30 A M Medical Record Number: [id number] Patient Account Number: [id number] Date of Birth/Sex: Treating RN: [birth year] (88 y.o. Marta Lamas Primary Care Imre Vecchione: Deland Pretty Other Clinician: Referring Lennox Dolberry: Treating Ezrael Sam/Extender: Wilford Corner in Treatment: 0 Clinic Level of Care Assessment Items TOOL 1 Quantity Score X- 1 0 Use when EandM and Procedure is performed on INITIAL visit ASSESSMENTS - Nursing Assessment / Reassessment X- 1 20 General Physical Exam (combine w/ comprehensive assessment (listed just below) when performed on new pt. evals) X- 1 25 Comprehensive Assessment (HX, ROS, Risk Assessments, Wounds Hx, etc.) ASSESSMENTS - Wound and Skin Assessment / Reassessment '[]'$  - 0 Dermatologic / Skin Assessment (not related to wound area) ASSESSMENTS - Ostomy and/or Continence Assessment and Care '[]'$  - 0 Incontinence Assessment and Management '[]'$  - 0 Ostomy Care Assessment and Management (repouching, etc.) PROCESS - Coordination of Care X - Simple Patient / Family Education for ongoing care 1 15 '[]'$  - 0 Complex (extensive) Patient / Family Education for ongoing care X- 1 10 Staff obtains Programmer, systems, Records, T Results / Process Orders est X- 1 10 Staff telephones HHA, Nursing Homes / Clarify orders / etc '[]'$  - 0 Routine Transfer to another Facility (non-emergent condition) '[]'$  - 0 Routine Hospital Admission (non-emergent condition) '[]'$  - 0 New Admissions / Biomedical engineer / Ordering NPWT Apligraf, etc. , '[]'$  - 0 Emergency Hospital Admission (emergent condition) PROCESS - Special Needs '[]'$  - 0 Pediatric / Minor Patient Management '[]'$  - 0 Isolation Patient Management '[]'$  - 0 Hearing / Language / Visual special needs '[]'$  - 0 Assessment of Community assistance (transportation, D/C planning, etc.) '[]'$  - 0 Additional assistance / Altered mentation '[]'$  - 0 Support Surface(s) Assessment (bed,  cushion, seat, etc.) INTERVENTIONS - Miscellaneous '[]'$  - 0 External ear exam '[]'$  - 0 Patient Transfer (multiple staff / Civil Service fast streamer / Similar devices) '[]'$  - 0 Simple Staple / Suture removal (25 or less) '[]'$  - 0 Complex Staple / Suture removal (26 or more) '[]'$  - 0 Hypo/Hyperglycemic Management (do not check if billed separately) X- 1 15 Ankle / Brachial Index (ABI) - do  not check if billed separately Has the patient been seen at the hospital within the last three years: Yes Total Score: 95 Level Of Care: New/Established - Level 3 Electronic Signature(s) Signed: 03/01/2022 5:14:53 PM By: Blanche East RN Oletta Lamas, Geryl C [phone] By: Blanche East RN [phone].pdf Page 3 of 12 Signed: 03/01/2022 5:14:53 Entered By: Blanche East on 03/01/2022 11:53:31 -------------------------------------------------------------------------------- Encounter Discharge Information Details Patient Name: Date of Service: [name] 03/01/2022 10:30 A M Medical Record Number: [id number] Patient Account Number: [id number] Date of Birth/Sex: Treating RN: 1931-05-02 (88 y.o. Marta Lamas Primary Care Onie Kasparek: Deland Pretty Other Clinician: Referring Nyellie Yetter: Treating Hammond Obeirne/Extender: Wilford Corner in Treatment: 0 Encounter Discharge Information Items Post Procedure Vitals Discharge Condition: Stable Temperature (F): 98.7 Ambulatory Status: Ambulatory Pulse (bpm): 90 Discharge Destination: Home Respiratory Rate (breaths/min): 16 Transportation: Private Auto Blood Pressure (mmHg): 133/75 Accompanied By: husband, Education officer, museum Schedule Follow-up Appointment: Yes Clinical Summary of Care: Electronic Signature(s) Signed: 03/01/2022 5:14:53 PM By: Blanche East RN Entered By: Blanche East on 03/01/2022 12:22:17 -------------------------------------------------------------------------------- Lower Extremity Assessment  Details Patient Name: Date of Service: Cynthia [name], [name] 03/01/2022 10:30 A M Medical Record Number: [id number] Patient Account Number: [id number] Date of Birth/Sex: Treating RN: May 05, 1931 (88 y.o. Marta Lamas Primary Care Saylah Ketner: Deland Pretty Other Clinician: Referring Yazleemar Strassner: Treating Kavi Almquist/Extender: Wilford Corner in Treatment: 0 Edema Assessment Assessed: [Left: No] [Right: No] [Left: Edema] [Right: :] Calf Left: Right: Point of Measurement: From Medial Instep 30.5 cm Ankle Left: Right: Point of Measurement: From Medial Instep 19.5 cm Vascular Assessment Pulses: Dorsalis Pedis Palpable: [Left:Yes] Blood Pressure: Brachial: [Left:133] Ankle: [Left:Dorsalis Pedis: 80] Ankle Brachial Index: [Left:0.60] Barman, Nehemie C ([id number]) [[id number].pdf Page 4 of 12] Electronic Signature(s) Signed: 03/01/2022 5:14:53 PM By: Blanche East RN Entered By: Blanche East on 03/01/2022 11:22:24 -------------------------------------------------------------------------------- Multi Wound Chart Details Patient Name: Date of Service: Cynthia [name], [name]. 03/01/2022 10:30 A M Medical Record Number: [id number] Patient Account Number: [id number] Date of Birth/Sex: Treating RN: 1932/02/02 (88 y.o. F) Primary Care Raylie Maddison: Deland Pretty Other Clinician: Referring Ilee Randleman: Treating Cashmere Dingley/Extender: Wilford Corner in Treatment: 0 Vital Signs Height(in): 50 Pulse(bpm): 90 Weight(lbs): 133 Blood Pressure(mmHg): 133/78 Body Mass Index(BMI): 21.5 Temperature(F): 98.7 Respiratory Rate(breaths/min): 16 [1:Photos:] Left, Posterior Lower Leg Left, Posterior, Superior Lower Leg Left, Medial Lower Leg Wound Location: Skin T ear/Laceration Skin Tear/Laceration Skin Tear/Laceration Wounding Event: Atypical Atypical Atypical Primary Etiology: Hypertension, Dementia Hypertension, Dementia  Hypertension, Dementia Comorbid History: 02/08/2022 02/04/2022 02/04/2022 Date Acquired: 0 0 0 Weeks of Treatment: Open Open Open Wound Status: No No No Wound Recurrence: 4.8x2.4x0.1 7.2x1.6x0.1 0.7x1x0.1 Measurements L x W x D (cm) 9.048 9.048 0.55 A (cm) : rea 0.905 0.905 0.055 Volume (cm) : Full Thickness Without Exposed Full Thickness Without Exposed Full Thickness Without Exposed Classification: Support Structures Support Structures Support Structures Medium Medium Medium Exudate A mount: Serous Serous Serous Exudate Type: Geophysical data processor Exudate Color: N/A N/A Flat and Intact Wound Margin: Medium (34-66%) Medium (34-66%) Medium (34-66%) Granulation A mount: Red, Pink Pink Red, Pink Granulation Quality: Medium (34-66%) Medium (34-66%) Medium (34-66%) Necrotic A mount: N/A Eschar Eschar Necrotic Tissue: Fat Layer (Subcutaneous Tissue): Yes N/A N/A Exposed Structures: Fascia: No Tendon: No Muscle: No Joint: No Bone: No N/A Small (1-33%) None Epithelialization: Debridement - Excisional Debridement - Excisional Debridement - Selective/Open Wound Debridement: Pre-procedure Verification/Time Out 11:43 11:43 11:43 Taken: Subcutaneous, Slough Subcutaneous, Psychologist, prison and probation services, [employer] Tissue Debrided: Skin/Subcutaneous Tissue  Skin/Subcutaneous Tissue Non-Viable Tissue Level: 11.52 11.52 0.7 Debridement A (sq cm): rea Curette Curette Curette Instrument: Minimum Minimum Minimum Bleeding: Pressure Pressure Pressure Hemostasis A chieved: Procedure was tolerated well Procedure was tolerated well Procedure was tolerated well Debridement Treatment Response: 4.8x2.4x0.1 7.2x1.6x0.1 0.7x1x0.1 Post Debridement Measurements L x W x D (cm) LAVERLE, PILLARD ([id number]) [id number] Page 5 of 12 0.905 0.905 0.055 Post Debridement Volume: (cm) Dry/Scaly: Yes Periwound Skin Moisture: Debridement Debridement Debridement Procedures  Performed: Treatment Notes Wound #1 (Lower Leg) Wound Laterality: Left, Posterior Cleanser Soap and Water Discharge Instruction: May shower and wash wound with dial antibacterial soap and water prior to dressing change. Peri-Wound Care Sween Lotion (Moisturizing lotion) Discharge Instruction: Apply moisturizing lotion as directed Topical Primary Dressing IODOFLEX 0.9% Cadexomer Iodine Pad 4x6 cm Discharge Instruction: Apply to wound bed as instructed Secondary Dressing Zetuvit Plus 4x8 in Discharge Instruction: Apply over primary dressing as directed. Secured With Compression Wrap Kerlix Roll 4.5x3.1 (in/yd) Discharge Instruction: Apply Kerlix and Coban compression as directed. Coban Self-Adherent Wrap 4x5 (in/yd) Discharge Instruction: Apply over Kerlix as directed. Compression Stockings Add-Ons Wound #2 (Lower Leg) Wound Laterality: Left, Posterior, Superior Cleanser Soap and Water Discharge Instruction: May shower and wash wound with dial antibacterial soap and water prior to dressing change. Peri-Wound Care Sween Lotion (Moisturizing lotion) Discharge Instruction: Apply moisturizing lotion as directed Topical Primary Dressing IODOFLEX 0.9% Cadexomer Iodine Pad 4x6 cm Discharge Instruction: Apply to wound bed as instructed Secondary Dressing Zetuvit Plus 4x8 in Discharge Instruction: Apply over primary dressing as directed. Secured With Compression Wrap Kerlix Roll 4.5x3.1 (in/yd) Discharge Instruction: Apply Kerlix and Coban compression as directed. Coban Self-Adherent Wrap 4x5 (in/yd) Discharge Instruction: Apply over Kerlix as directed. Compression Stockings Add-Ons Wound #3 (Lower Leg) Wound Laterality: Left, Medial Cleanser Soap and Water Discharge Instruction: May shower and wash wound with dial antibacterial soap and water prior to dressing change. ANDRIEA, HASEGAWA ([id number]) [id number] Page 6 of 12 Peri-Wound Care Sween  Lotion (Moisturizing lotion) Discharge Instruction: Apply moisturizing lotion as directed Topical Primary Dressing IODOFLEX 0.9% Cadexomer Iodine Pad 4x6 cm Discharge Instruction: Apply to wound bed as instructed Secondary Dressing Zetuvit Plus 4x8 in Discharge Instruction: Apply over primary dressing as directed. Secured With Compression Wrap Kerlix Roll 4.5x3.1 (in/yd) Discharge Instruction: Apply Kerlix and Coban compression as directed. Coban Self-Adherent Wrap 4x5 (in/yd) Discharge Instruction: Apply over Kerlix as directed. Compression Stockings Add-Ons Electronic Signature(s) Signed: 03/01/2022 12:32:48 PM By: Fredirick Maudlin MD FACS Entered By: Fredirick Maudlin on 03/01/2022 12:32:48 -------------------------------------------------------------------------------- Multi-Disciplinary Care Plan Details Patient Name: Date of Service: Cynthia [name], [name]. 03/01/2022 10:30 A M Medical Record Number: [id number] Patient Account Number: [id number] Date of Birth/Sex: Treating RN: 1931/05/01 (88 y.o. Marta Lamas Primary Care Nyssa Sayegh: Deland Pretty Other Clinician: Referring Aijah Lattner: Treating Heike Pounds/Extender: Wilford Corner in Treatment: 0 Active Inactive Orientation to the Wound Care Program Nursing Diagnoses: Knowledge deficit related to the wound healing center program Goals: Patient/caregiver will verbalize understanding of the [facility] Date Initiated: 03/01/2022 Target Resolution Date: 03/22/2022 Goal Status: Active Interventions: Provide education on orientation to the wound center Notes: Venous Leg Ulcer Nursing Diagnoses: Potential for venous Insuffiency (use before diagnosis confirmed) Goals: Patient will maintain optimal edema control Date Initiated: 03/01/2022 Target Resolution Date: 03/29/2022 TELESHIA, LEMERE ([id number]) [id number] Page 7 of 12 Goal Status:  Active Interventions: Provide education on venous insufficiency Treatment Activities: Therapeutic compression applied : 03/01/2022 Notes: Wound/Skin Impairment Nursing Diagnoses: Knowledge deficit related to ulceration/compromised  skin integrity Goals: Ulcer/skin breakdown will have a volume reduction of 30% by week 4 Date Initiated: 03/01/2022 Target Resolution Date: 03/29/2022 Goal Status: Active Interventions: Assess ulceration(s) every visit Treatment Activities: Topical wound management initiated : 03/01/2022 Notes: Electronic Signature(s) Signed: 03/01/2022 5:14:53 PM By: Blanche East RN Entered By: Blanche East on 03/01/2022 11:52:06 -------------------------------------------------------------------------------- Pain Assessment Details Patient Name: Date of Service: Cynthia RDSCathlean Mitchell 03/01/2022 10:30 A M Medical Record Number: [id number] Patient Account Number: [id number] Date of Birth/Sex: Treating RN: [birth year] (88 y.o. Marta Lamas Primary Care Quavion Boule: Deland Pretty Other Clinician: Referring Dasia Guerrier: Treating Rual Vermeer/Extender: Wilford Corner in Treatment: 0 Active Problems Location of Pain Severity and Description of Pain Patient Has Paino No Site Locations Pain Management and Medication Current Pain Management: ANEKA, FAGERSTROM ([id number]) [id number] Page 8 of 12 Electronic Signature(s) Signed: 03/01/2022 5:14:53 PM By: Blanche East RN Entered By: Blanche East on 03/01/2022 11:43:54 -------------------------------------------------------------------------------- Patient/Caregiver Education Details Patient Name: Date of Service: Cynthia [name], [name] 11/15/2023andnbsp10:30 A M Medical Record Number: [id number] Patient Account Number: [id number] Date of Birth/Gender: Treating RN: [birth year] (88 y.o. Marta Lamas Primary Care Physician: Deland Pretty Other Clinician: Referring  Physician: Treating Physician/Extender: Wilford Corner in Treatment: 0 Education Assessment Education Provided To: Patient Education Topics Provided Venous: Methods: Explain/Verbal Responses: Reinforcements needed, State content correctly [place]: o Methods: Explain/Verbal Responses: Reinforcements needed, State content correctly Electronic Signature(s) Signed: 03/01/2022 5:14:53 PM By: Blanche East RN Entered By: Blanche East on 03/01/2022 11:52:32 -------------------------------------------------------------------------------- Wound Assessment Details Patient Name: Date of Service: Cynthia [name], [name] 03/01/2022 10:30 A M Medical Record Number: [id number] Patient Account Number: [id number] Date of Birth/Sex: Treating RN: Sep 05, 1931 (88 y.o. Marta Lamas Primary Care Sahian Kerney: Deland Pretty Other Clinician: Referring Dione Petron: Treating Raydon Chappuis/Extender: Wilford Corner in Treatment: 0 Wound Status Wound Number: 1 Primary Etiology: Atypical Wound Location: Left, Posterior Lower Leg Wound Status: Open Wounding Event: Skin Tear/Laceration Notes: cluster of 3 wounds Date Acquired: 02/08/2022 Comorbid History: Hypertension, Dementia Weeks Of Treatment: 0 Clustered Wound: No Photos DARETH, ANDREW ([id number]) [id number] Page 9 of 12 Wound Measurements Length: (cm) 4.8 Width: (cm) 2.4 Depth: (cm) 0.1 Area: (cm) 9.048 Volume: (cm) 0.905 % Reduction in Area: % Reduction in Volume: Tunneling: No Undermining: No Wound Description Classification: Full Thickness Without Exposed Support Exudate Amount: Medium Exudate Type: Serous Exudate Color: amber Structures Foul Odor After Cleansing: No Slough/Fibrino Yes Wound Bed Granulation Amount: Medium (34-66%) Exposed Structure Granulation Quality: Red, Pink Fascia Exposed: No Necrotic Amount: Medium  (34-66%) Fat Layer (Subcutaneous Tissue) Exposed: Yes Tendon Exposed: No Muscle Exposed: No Joint Exposed: No Bone Exposed: No Periwound Skin Texture Texture Color No Abnormalities Noted: No No Abnormalities Noted: No Moisture No Abnormalities Noted: No Electronic Signature(s) Signed: 03/01/2022 5:14:53 PM By: Blanche East RN Entered By: Blanche East on 03/01/2022 11:34:48 -------------------------------------------------------------------------------- Wound Assessment Details Patient Name: Date of Service: Cynthia [name], [name]. 03/01/2022 10:30 A M Medical Record Number: [id number] Patient Account Number: [id number] Date of Birth/Sex: Treating RN: 08/06/1931 (88 y.o. Marta Lamas Primary Care Kaysea Raya: Deland Pretty Other Clinician: Referring Daymen Hassebrock: Treating Greg Eckrich/Extender: Wilford Corner in Treatment: 0 Wound Status Wound Number: 2 Primary Etiology: Atypical Wound Location: Left, Posterior, Superior Lower Leg Wound Status: Open Wounding Event: Skin Tear/Laceration Comorbid History: Hypertension, Dementia Date Acquired: 02/04/2022 Weeks Of Treatment: 0 Clustered Wound: No Photos BENETTA, MACLAREN ([id number]) [id number] Page 10  of 12 Wound Measurements Length: (cm) 7.2 Width: (cm) 1.6 Depth: (cm) 0.1 Area: (cm) 9.048 Volume: (cm) 0.905 % Reduction in Area: % Reduction in Volume: Epithelialization: Small (1-33%) Tunneling: No Undermining: No Wound Description Classification: Full Thickness Without Exposed Support Exudate Amount: Medium Exudate Type: Serous Exudate Color: amber Structures Foul Odor After Cleansing: No Slough/Fibrino Yes Wound Bed Granulation Amount: Medium (34-66%) Granulation Quality: Pink Necrotic Amount: Medium (34-66%) Necrotic Quality: Eschar Periwound Skin Texture Texture Color No Abnormalities Noted: No No Abnormalities Noted: No Moisture No Abnormalities Noted:  No Electronic Signature(s) Signed: 03/01/2022 5:14:53 PM By: Blanche East RN Entered By: Blanche East on 03/01/2022 11:36:05 -------------------------------------------------------------------------------- Wound Assessment Details Patient Name: Date of Service: Cynthia [name], [name] 03/01/2022 10:30 A M Medical Record Number: [id number] Patient Account Number: [id number] Date of Birth/Sex: Treating RN: 03-May-1931 (88 y.o. Marta Lamas Primary Care Sheri Gatchel: Deland Pretty Other Clinician: Referring Jayden Kratochvil: Treating Nicolaos Mitrano/Extender: Wilford Corner in Treatment: 0 Wound Status Wound Number: 3 Primary Etiology: Atypical Wound Location: Left, Medial Lower Leg Wound Status: Open Wounding Event: Skin Tear/Laceration Comorbid History: Hypertension, Dementia Date Acquired: 02/04/2022 Weeks Of Treatment: 0 Clustered Wound: No Photos DAILEY, ALBERSON ([id number]) [id number] Page 11 of 12 Wound Measurements Length: (cm) 0.7 Width: (cm) 1 Depth: (cm) 0.1 Area: (cm) 0.55 Volume: (cm) 0.055 % Reduction in Area: % Reduction in Volume: Epithelialization: None Tunneling: No Undermining: No Wound Description Classification: Full Thickness Without Exposed Support Wound Margin: Flat and Intact Exudate Amount: Medium Exudate Type: Serous Exudate Color: amber Structures Foul Odor After Cleansing: No Slough/Fibrino Yes Wound Bed Granulation Amount: Medium (34-66%) Granulation Quality: Red, Pink Necrotic Amount: Medium (34-66%) Necrotic Quality: Eschar Periwound Skin Texture Texture Color No Abnormalities Noted: No No Abnormalities Noted: No Moisture No Abnormalities Noted: No Dry / Scaly: Yes Electronic Signature(s) Signed: 03/01/2022 5:14:53 PM By: Blanche East RN Entered By: Blanche East on 03/01/2022 11:37:27 -------------------------------------------------------------------------------- Vitals  Details Patient Name: Date of Service: Cynthia [name], [name]. 03/01/2022 10:30 A M Medical Record Number: [id number] Patient Account Number: [id number] Date of Birth/Sex: Treating RN: 07/24/1931 (88 y.o. Iver Nestle, [place] Primary Care Zeus Marquis: Deland Pretty Other Clinician: Referring Rajanee Schuelke: Treating Mirabel Ahlgren/Extender: Wilford Corner in Treatment: 0 Vital Signs Time Taken: 10:54 Temperature (F): 98.7 Height (in): 66 Pulse (bpm): 90 Source: Stated Respiratory Rate (breaths/min): 16 Weight (lbs): 133 Blood Pressure (mmHg): 133/78 Source: Stated Reference Range: 80 - 120 mg / dl Body Mass Index (BMI): 21.5 Electronic Signature(s) Signed: 03/01/2022 5:14:53 PM By: Blanche East RN Oletta Lamas, [name] ([id number]) [id number] Page 12 of 12 Entered By: Blanche East on 03/01/2022 10:55:18

## 2022-03-02 NOTE — Progress Notes (Signed)
ANAVEY, COOMBES ([id number]) [phone].pdf Page 1 of 4 Visit Report for 03/01/2022 Abuse Risk Screen Details Patient Name: Date of Service: [name] 03/01/2022 10:30 A M Medical Record Number: [id number] Patient Account Number: [id number] Date of Birth/Sex: Treating RN: Nov 07, 1931 (88 y.o. Marta Lamas Primary Care Cedrick Partain: Deland Pretty Other Clinician: Referring Becket Wecker: Treating Masami Plata/Extender: [place] in Treatment: 0 Abuse Risk Screen Items Answer Notes unable to answer d/t dementia. Electronic Signature(s) Signed: 03/01/2022 5:14:53 PM By: Blanche East RN Entered By: Blanche East on 03/01/2022 11:06:55 -------------------------------------------------------------------------------- Activities of Daily Living Details Patient Name: Date of Service: [name] 03/01/2022 10:30 A M Medical Record Number: [id number] Patient Account Number: [id number] Date of Birth/Sex: Treating RN: 1932/03/29 (88 y.o. Marta Lamas Primary Care Alira Fretwell: Deland Pretty Other Clinician: Referring Nabiha Planck: Treating Ireanna Finlayson/Extender: [place] in Treatment: 0 Activities of Daily Living Items Answer Activities of Daily Living (Please select one for each item) Drive Automobile Not Able T Medications ake Need Assistance Use T elephone Need Assistance Care for Appearance Need Assistance Use T oilet Need Assistance Bath / Shower Need Assistance Dress Self Need Assistance Feed Self Need Assistance Walk Need Assistance Get In / Out Bed Need Assistance Housework Not Able Prepare Meals Need Assistance Handle Money Not Able Shop for Self Not Able Electronic Signature(s) Signed: 03/01/2022 5:14:53 PM By: Blanche East RN Entered By: Blanche East on 03/01/2022 11:07:35 Delle Reining ([id number]) [id number] Page 2 of  4 -------------------------------------------------------------------------------- Education Screening Details Patient Name: Date of Service: [name] 03/01/2022 10:30 A M Medical Record Number: [id number] Patient Account Number: [id number] Date of Birth/Sex: Treating RN: November 06, 1931 (88 y.o. Marta Lamas Primary Care Jalyn Rosero: Deland Pretty Other Clinician: Referring Sharlet Notaro: Treating Poppy Mcafee/Extender: [place] in Treatment: 0 Primary Learner Assessed: Caregiver social worker Learning Preferences/Education Level/Primary Language Learning Preference: Explanation Highest Education Level: College or Above Preferred Language: English Community education officer Language Barrier: No Translator Needed: No Memory Deficit: No Emotional Barrier: No Cultural/Religious Beliefs Affecting Medical Care: No Physical Barrier Impaired Vision: No Impaired Hearing: No Decreased Hand dexterity: No Knowledge/Comprehension Knowledge Level: Medium Comprehension Level: Medium Ability to understand written instructions: Medium Ability to understand verbal instructions: Medium Motivation Cooperation: Cooperative Education Importance: Acknowledges Need Interest in Health Problems: Asks Questions Willingness to Engage in Self-Management Medium Activities: Readiness to Engage in Self-Management Medium Activities: Electronic Signature(s) Signed: 03/01/2022 5:14:53 PM By: Blanche East RN Entered By: Blanche East on 03/01/2022 11:08:20 -------------------------------------------------------------------------------- Fall Risk Assessment Details Patient Name: Date of Service: Cynthia [name], Laurell Roof C. 03/01/2022 10:30 A M Medical Record Number: [id number] Patient Account Number: [id number] Date of Birth/Sex: Treating RN: [birth year] (88 y.o. Marta Lamas Primary Care Avondre Richens: Deland Pretty Other Clinician: Referring Amanat Hackel: Treating Rosamary Boudreau/Extender:  [place] in Treatment: 0 Fall Risk Assessment Items Have you had 2 or more falls in the last 12 monthso 0 Yes Have you had any fall that resulted in injury in the last 12 monthso 0 No FALLS RISK MCKENSIE SCOTTI, Trena C ([id number]) [phone] [id number] Page 3 of 4 History of falling - immediate or within 3 months 25 Yes Secondary diagnosis (Do you have 2 or more medical diagnoseso) 0 No Ambulatory aid None/bed rest/wheelchair/nurse 0 No Crutches/cane/walker 15 Yes Furniture 0 No Intravenous therapy Access/Saline/Heparin Lock 0 No Gait/Transferring Normal/ bed rest/ wheelchair 0 No Weak (short steps with or without shuffle, stooped but able to  lift head while walking, may seek 10 Yes support from furniture) Impaired (short steps with shuffle, may have difficulty arising from chair, head down, impaired 0 No balance) Mental Status Oriented to own ability 0 No Electronic Signature(s) Signed: 03/01/2022 5:14:53 PM By: Blanche East RN Entered By: Blanche East on 03/01/2022 11:09:04 -------------------------------------------------------------------------------- Foot Assessment Details Patient Name: Date of Service: Cynthia [name], [name]. 03/01/2022 10:30 A M Medical Record Number: [id number] Patient Account Number: [id number] Date of Birth/Sex: Treating RN: 1931/10/09 (88 y.o. Marta Lamas Primary Care Kinsly Hild: Deland Pretty Other Clinician: Referring Braeden Dolinski: Treating Tyrail Grandfield/Extender: [place] in Treatment: 0 Foot Assessment Items Site Locations + = Sensation present, - = Sensation absent, C = Callus, U = Ulcer R = Redness, W = Warmth, M = Maceration, PU = Pre-ulcerative lesion F = Fissure, S = Swelling, D = Dryness Assessment Right: Left: Other Deformity: No No Prior Foot Ulcer: No No Prior Amputation: No No Charcot Joint: No No Ambulatory Status: Ambulatory With  Help Assistance Device: Walker GaitSELEENA, REIMERS ([id number]) [phone].pdf Page 4 of 4 Electronic Signature(s) Signed: 03/01/2022 5:14:53 PM By: Blanche East RN Entered By: Blanche East on 03/01/2022 11:14:23 -------------------------------------------------------------------------------- Nutrition Risk Screening Details Patient Name: Date of Service: [name] 03/01/2022 10:30 A M Medical Record Number: [id number] Patient Account Number: [id number] Date of Birth/Sex: Treating RN: June 10, 1931 (88 y.o. Iver Nestle, [place] Primary Care Dhalia Zingaro: Deland Pretty Other Clinician: Referring Niesha Bame: Treating Kasin Tonkinson/Extender: Wilford Corner in Treatment: 0 Height (in): 66 Weight (lbs): 133 Body Mass Index (BMI): 21.5 Nutrition Risk Screening Items Score Screening NUTRITION RISK SCREEN: I have an illness or condition that made me change the kind and/or amount of food I eat 0 No I eat fewer than two meals per day 0 No I eat few fruits and vegetables, or milk products 0 No I have three or more drinks of beer, liquor or wine almost every day 0 No I have tooth or mouth problems that make it hard for me to eat 0 No I don't always have enough money to buy the food I need 0 No I eat alone most of the time 0 No I take three or more different prescribed or over-the-counter drugs a day 0 No Without wanting to, I have lost or gained 10 pounds in the last six months 2 Yes I am not always physically able to shop, cook and/or feed myself 0 No Nutrition Protocols Good Risk Protocol 0 No interventions needed Moderate Risk Protocol High Risk Proctocol Risk Level: Good Risk Score: 2 Electronic Signature(s) Signed: 03/01/2022 5:14:53 PM By: Blanche East RN Entered By: Blanche East on 03/01/2022 11:09:33

## 2022-03-02 NOTE — Progress Notes (Signed)
[name], [name] ([id number]) [id number] Page 1 of 13 Visit Report for 03/01/2022 Chief Complaint Document Details Patient Name: Date of Service: Cynthia Phill Mutter 03/01/2022 10:30 A M Medical Record Number: [id number] Patient Account Number: [id number] Date of Birth/Sex: Treating RN: Jan 20, 1932 (88 y.o. F) Primary Care Provider: Deland Pretty Other Clinician: Referring Provider: Treating Provider/Extender: Wilford Corner in Treatment: 0 Information Obtained from: Patient Chief Complaint Patient seen for complaints of Non-Healing Wounds. Electronic Signature(s) Signed: 03/01/2022 12:33:12 PM By: Fredirick Maudlin MD FACS Entered By: Fredirick Maudlin on 03/01/2022 12:33:12 -------------------------------------------------------------------------------- Debridement Details Patient Name: Date of Service: [name], [name]. 03/01/2022 10:30 A M Medical Record Number: [id number] Patient Account Number: [id number] Date of Birth/Sex: Treating RN: May 15, 1931 (88 y.o. Marta Lamas Primary Care Provider: Deland Pretty Other Clinician: Referring Provider: Treating Provider/Extender: Wilford Corner in Treatment: 0 Debridement Performed for Assessment: Wound #3 Left,Medial Lower Leg Performed By: Physician Fredirick Maudlin, MD Debridement Type: Debridement Level of Consciousness (Pre-procedure): Awake and Alert Pre-procedure Verification/Time Out Yes - 11:43 Taken: Start Time: 11:44 T Area Debrided (L x W): otal 0.7 (cm) x 1 (cm) = 0.7 (cm) Tissue and other material debrided: Non-Viable, Eschar, Slough, Slough Level: Non-Viable Tissue Debridement Description: Selective/Open Wound Instrument: Curette Bleeding: Minimum Hemostasis Achieved: Pressure Response to Treatment: Procedure was tolerated well Level of Consciousness (Post- Awake and Alert procedure): Post Debridement Measurements of  Total Wound Length: (cm) 0.7 Width: (cm) 1 Depth: (cm) 0.1 Volume: (cm) 0.055 Character of Wound/Ulcer Post Debridement: Requires Further Debridement Post Procedure Diagnosis Same as Pre-procedure Notes AIANNA, [facility] ([id number]) [id number] Page 2 of 13 Scribed for Dr. Celine Ahr by Blanche East, RN Electronic Signature(s) Signed: 03/01/2022 12:41:41 PM By: Fredirick Maudlin MD FACS Signed: 03/01/2022 5:14:53 PM By: Blanche East RN Entered By: Blanche East on 03/01/2022 11:45:17 -------------------------------------------------------------------------------- Debridement Details Patient Name: Date of Service: [name], [name]. 03/01/2022 10:30 A M Medical Record Number: [id number] Patient Account Number: [id number] Date of Birth/Sex: Treating RN: [birth year] (88 y.o. Marta Lamas Primary Care Provider: Deland Pretty Other Clinician: Referring Provider: Treating Provider/Extender: Wilford Corner in Treatment: 0 Debridement Performed for Assessment: Wound #1 Left,Posterior Lower Leg Performed By: Physician Fredirick Maudlin, MD Debridement Type: Debridement Level of Consciousness (Pre-procedure): Awake and Alert Pre-procedure Verification/Time Out Yes - 11:43 Taken: Start Time: 11:44 T Area Debrided (L x W): otal 4.8 (cm) x 2.4 (cm) = 11.52 (cm) Tissue and other material debrided: Non-Viable, Slough, Subcutaneous, Slough Level: Skin/Subcutaneous Tissue Debridement Description: Excisional Instrument: Curette Bleeding: Minimum Hemostasis Achieved: Pressure Response to Treatment: Procedure was tolerated well Level of Consciousness (Post- Awake and Alert procedure): Post Debridement Measurements of Total Wound Length: (cm) 4.8 Width: (cm) 2.4 Depth: (cm) 0.1 Volume: (cm) 0.905 Character of Wound/Ulcer Post Debridement: Requires Further Debridement Post Procedure Diagnosis Same as Pre-procedure Notes Scribed  for Dr. Celine Ahr by Blanche East, RN Electronic Signature(s) Signed: 03/01/2022 12:41:41 PM By: Fredirick Maudlin MD FACS Signed: 03/01/2022 5:14:53 PM By: Blanche East RN Entered By: Blanche East on 03/01/2022 11:45:50 -------------------------------------------------------------------------------- Debridement Details Patient Name: Date of Service: [name], Cynthia Roof Mitchell. 03/01/2022 10:30 A M Medical Record Number: [id number] Patient Account Number: [id number] Date of Birth/Sex: Treating RN: November 29, 1931 (88 y.o. Marta Lamas Primary Care Provider: Deland Pretty Other Clinician: Referring Provider: Treating Provider/Extender: Wilford Corner in TreatmentDANALEE, [name] ([id number]) [id number] Page 3 of 13 Debridement Performed for Assessment: Wound #2 Left,Posterior,Superior Lower  Leg Performed By: Physician Fredirick Maudlin, MD Debridement Type: Debridement Level of Consciousness (Pre-procedure): Awake and Alert Pre-procedure Verification/Time Out Yes - 11:43 Taken: Start Time: 11:44 T Area Debrided (L x W): otal 7.2 (cm) x 1.6 (cm) = 11.52 (cm) Tissue and other material debrided: Non-Viable, Slough, Subcutaneous, Slough Level: Skin/Subcutaneous Tissue Debridement Description: Excisional Instrument: Curette Bleeding: Minimum Hemostasis Achieved: Pressure Response to Treatment: Procedure was tolerated well Level of Consciousness (Post- Awake and Alert procedure): Post Debridement Measurements of Total Wound Length: (cm) 7.2 Width: (cm) 1.6 Depth: (cm) 0.1 Volume: (cm) 0.905 Character of Wound/Ulcer Post Debridement: Requires Further Debridement Post Procedure Diagnosis Same as Pre-procedure Notes Scribed for Dr. Celine Ahr by Blanche East, RN Electronic Signature(s) Signed: 03/01/2022 12:41:41 PM By: Fredirick Maudlin MD FACS Signed: 03/01/2022 5:14:53 PM By: Blanche East RN Entered By: Blanche East on  03/01/2022 11:46:29 -------------------------------------------------------------------------------- HPI Details Patient Name: Date of Service: [name], Cynthia Roof Mitchell. 03/01/2022 10:30 A M Medical Record Number: [id number] Patient Account Number: [id number] Date of Birth/Sex: Treating RN: 03-14-1932 (88 y.o. F) Primary Care Provider: Deland Pretty Other Clinician: Referring Provider: Treating Provider/Extender: Wilford Corner in Treatment: 0 History of Present Illness HPI Description: ADMISSION 03/01/2022 This is an 88 year old woman with dementia, residing in a memory care facility. She has a past medical history significant for renal failure, status post kidney transplant and now off dialysis. She is on chronic immunosuppression with tacrolimus and prednisone. She was recently admitted with an extensive DVT of the left lower extremity, identified when her nursing facility staff noticed extreme swelling of her left lower leg. She was initiated on Eliquis. Vascular surgery saw her and recommended compression stockings. Unfortunately, her skin is so thin that the stockings, combined with the leg swelling, resulted in multiple wounds opening up on her left lower leg. ABI in clinic today was only 0.61. There are multiple lesions open on her leg, including a cluster of 3 just above the lateral ankle, 1 on her posterior calf, and another on the medial lower leg. They all have thick slough and eschar present. The skin of her leg is discolored and blue. Electronic Signature(s) Signed: 03/01/2022 12:35:46 PM By: Fredirick Maudlin MD FACS Entered By: Fredirick Maudlin on 03/01/2022 12:35:46 Enlow, [name] ([id number]) [id number] Page 4 of 13 -------------------------------------------------------------------------------- Physical Exam Details Patient Name: Date of Service: [name], [name] 03/01/2022 10:30 A M Medical Record Number:  [id number] Patient Account Number: [id number] Date of Birth/Sex: Treating RN: [birth year] (88 y.o. F) Primary Care Provider: Deland Pretty Other Clinician: Referring Provider: Treating Provider/Extender: Wilford Corner in Treatment: 0 Constitutional . . . . No acute distress. Respiratory Normal work of breathing on room air.. Cardiovascular Distal pulses nonpalpable. Notes 03/01/2022: There are multiple lesions open on her left lower leg, including a cluster of 3 just above the lateral ankle, 1 on her posterior calf, and another on the medial lower leg. They all have thick slough and eschar present. The skin of her leg is discolored and blue. Electronic Signature(s) Signed: 03/01/2022 12:36:47 PM By: Fredirick Maudlin MD FACS Entered By: Fredirick Maudlin on 03/01/2022 12:36:47 -------------------------------------------------------------------------------- Physician Orders Details Patient Name: Date of Service: [name], Cynthia ELYN Mitchell. 03/01/2022 10:30 A M Medical Record Number: [id number] Patient Account Number: [id number] Date of Birth/Sex: Treating RN: 21-Mar-1932 (88 y.o. Marta Lamas Primary Care Provider: Deland Pretty Other Clinician: Referring Provider: Treating Provider/Extender: Wilford Corner in Treatment: 0 Verbal / Phone Orders: No Diagnosis  Coding ICD-10 Coding Code Description [id number] Non-pressure chronic ulcer of other part of left lower leg with fat layer exposed I82.492 Acute embolism and thrombosis of other specified deep vein of left lower extremity Dementia in other diseases classified elsewhere, unspecified severity, without behavioral disturbance, psychotic disturbance, mood F02.80 disturbance, and anxiety Follow-up Appointments ppointment in 1 week. - Dr. Celine Ahr Rm 4 Return A Monday 03/01/22 at 1:00PM Anesthetic Wound #1 Left,Posterior Lower Leg (In clinic) Topical Lidocaine 5% applied to wound  bed Wound #2 Left,Posterior,Superior Lower Leg (In clinic) Topical Lidocaine 5% applied to wound bed Wound #3 Left,Medial Lower Leg (In clinic) Topical Lidocaine 5% applied to wound bed [address] ZARIA, TAHA ([id number]) [id number] Page 5 of 13 May shower with protection but do not get wound dressing(s) wet. - Keep dressing dry at all times, May purchase a cast protector or sponge bath Edema Control - Lymphedema / SCD / Other Elevate legs to the level of the heart or above for 30 minutes daily and/or when sitting, a frequency of: Avoid standing for long periods of time. Wound Treatment Wound #1 - Lower Leg Wound Laterality: Left, Posterior Cleanser: Soap and Water Discharge Instructions: May shower and wash wound with dial antibacterial soap and water prior to dressing change. Peri-Wound Care: Sween Lotion (Moisturizing lotion) Discharge Instructions: Apply moisturizing lotion as directed Prim Dressing: IODOFLEX 0.9% Cadexomer Iodine Pad 4x6 cm ary Discharge Instructions: Apply to wound bed as instructed Secondary Dressing: Zetuvit Plus 4x8 in Discharge Instructions: Apply over primary dressing as directed. Compression Wrap: Kerlix Roll 4.5x3.1 (in/yd) Discharge Instructions: Apply Kerlix and Coban compression as directed. Compression Wrap: Coban Self-Adherent Wrap 4x5 (in/yd) Discharge Instructions: Apply over Kerlix as directed. Wound #2 - Lower Leg Wound Laterality: Left, Posterior, Superior Cleanser: Soap and Water Discharge Instructions: May shower and wash wound with dial antibacterial soap and water prior to dressing change. Peri-Wound Care: Sween Lotion (Moisturizing lotion) Discharge Instructions: Apply moisturizing lotion as directed Prim Dressing: IODOFLEX 0.9% Cadexomer Iodine Pad 4x6 cm ary Discharge Instructions: Apply to wound bed as instructed Secondary Dressing: Zetuvit Plus 4x8 in Discharge Instructions: Apply over  primary dressing as directed. Compression Wrap: Kerlix Roll 4.5x3.1 (in/yd) Discharge Instructions: Apply Kerlix and Coban compression as directed. Compression Wrap: Coban Self-Adherent Wrap 4x5 (in/yd) Discharge Instructions: Apply over Kerlix as directed. Wound #3 - Lower Leg Wound Laterality: Left, Medial Cleanser: Soap and Water Discharge Instructions: May shower and wash wound with dial antibacterial soap and water prior to dressing change. Peri-Wound Care: Sween Lotion (Moisturizing lotion) Discharge Instructions: Apply moisturizing lotion as directed Prim Dressing: IODOFLEX 0.9% Cadexomer Iodine Pad 4x6 cm ary Discharge Instructions: Apply to wound bed as instructed Secondary Dressing: Zetuvit Plus 4x8 in Discharge Instructions: Apply over primary dressing as directed. Compression Wrap: Kerlix Roll 4.5x3.1 (in/yd) Discharge Instructions: Apply Kerlix and Coban compression as directed. Compression Wrap: Coban Self-Adherent Wrap 4x5 (in/yd) Discharge Instructions: Apply over Kerlix as directed. [facility] Vascular and vein specialist - ABI 0.6 in clinic, referral for formal ABIs Electronic Signature(s) Signed: 03/01/2022 5:14:53 PM By: Blanche East RN Signed: 03/01/2022 5:18:05 PM By: Fredirick Maudlin MD FACS Previous Signature: 03/01/2022 12:41:41 PM Version By: Fredirick Maudlin MD FACS Entered By: Blanche East on 03/01/2022 13:43:48 Saxe, [name] ([id number]) [id number] Page 6 of 13 Prescription 03/01/2022 -------------------------------------------------------------------------------- Kakos, Shaena Mitchell. Fredirick Maudlin MD Patient Name: Provider: 21-Apr-1931 [id number] Date of Birth: NPI#: [id number] Sex: DEA #: [phone] Phone #: License #: [place] Patient  Address: [address] [place], [place] [address] [place], [place]  [address] [phone] Allergies cephalexin; Keflex; minocycline Provider's Orders Vascular and vein specialist - ABI 0.6 in clinic, referral for formal ABIs Hand Signature: Date(s): Electronic Signature(s) Signed: 03/01/2022 5:14:53 PM By: Blanche East RN Signed: 03/01/2022 5:18:05 PM By: Fredirick Maudlin MD FACS Previous Signature: 03/01/2022 12:38:14 PM Version By: Fredirick Maudlin MD FACS Entered By: Blanche East on 03/01/2022 13:43:48 -------------------------------------------------------------------------------- Problem List Details Patient Name: Date of Service: [name], [name]. 03/01/2022 10:30 A M Medical Record Number: [id number] Patient Account Number: [id number] Date of Birth/Sex: Treating RN: 08-08-1931 (88 y.o. F) Primary Care Provider: Deland Pretty Other Clinician: Referring Provider: Treating Provider/Extender: Wilford Corner in Treatment: 0 Active Problems ICD-10 Encounter Code Description Active Date MDM Diagnosis [phone] Non-pressure chronic ulcer of other part of left lower leg with fat layer 03/01/2022 No Yes exposed I82.492 Acute embolism and thrombosis of other specified deep vein of left lower 03/01/2022 No Yes extremity F02.80 Dementia in other diseases classified elsewhere, unspecified severity, without 03/01/2022 No Yes behavioral disturbance, psychotic disturbance, mood disturbance, and anxiety LENNYX, VERDELL Mitchell ([id number]) [id number] Page 7 of 13 Inactive Problems Resolved Problems Electronic Signature(s) Signed: 03/01/2022 12:32:38 PM By: Fredirick Maudlin MD FACS Entered By: Fredirick Maudlin on 03/01/2022 12:32:38 -------------------------------------------------------------------------------- Progress Note Details Patient Name: Date of Service: [name], Cynthia Waymond Cera. 03/01/2022 10:30 A M Medical Record Number: [id number] Patient Account Number: [id number] Date of Birth/Sex: Treating  RN: 1932/04/01 (88 y.o. F) Primary Care Provider: Deland Pretty Other Clinician: Referring Provider: Treating Provider/Extender: Wilford Corner in Treatment: 0 Subjective Chief Complaint Information obtained from Patient Patient seen for complaints of Non-Healing Wounds. History of Present Illness (HPI) ADMISSION 03/01/2022 This is an 88 year old woman with dementia, residing in a memory care facility. She has a past medical history significant for renal failure, status post kidney transplant and now off dialysis. She is on chronic immunosuppression with tacrolimus and prednisone. She was recently admitted with an extensive DVT of the left lower extremity, identified when her nursing facility staff noticed extreme swelling of her left lower leg. She was initiated on Eliquis. Vascular surgery saw her and recommended compression stockings. Unfortunately, her skin is so thin that the stockings, combined with the leg swelling, resulted in multiple wounds opening up on her left lower leg. ABI in clinic today was only 0.61. There are multiple lesions open on her leg, including a cluster of 3 just above the lateral ankle, 1 on her posterior calf, and another on the medial lower leg. They all have thick slough and eschar present. The skin of her leg is discolored and blue. Patient History Information obtained from Patient. Allergies cephalexin (Severity: Moderate, Reaction: hives), Keflex (Severity: Moderate), minocycline (Severity: Moderate) Family History Unknown History. Social History Never smoker, Marital Status - Married, Alcohol Use - Never, Drug Use - No History, Caffeine Use - Rarely. Medical History Eyes Denies history of Cataracts, Glaucoma, Optic Neuritis Ear/Nose/Mouth/Throat Denies history of Chronic sinus problems/congestion, Middle ear problems Hematologic/Lymphatic Denies history of Anemia, Hemophilia, Human Immunodeficiency Virus, Lymphedema,  Sickle Cell Disease Respiratory Denies history of Aspiration, Asthma, Chronic Obstructive Pulmonary Disease (COPD), Pneumothorax, Sleep Apnea, Tuberculosis Cardiovascular Patient has history of Hypertension Gastrointestinal Denies history of Cirrhosis , Colitis, Crohnoos, Hepatitis A, Hepatitis B Endocrine Denies history of Type I Diabetes, Type II Diabetes Genitourinary Denies history of End Stage Renal Disease Integumentary (  Skin) Denies history of History of Burn Musculoskeletal Denies history of Gout, Rheumatoid Arthritis, Osteoarthritis, Osteomyelitis Neurologic Patient has history of Dementia Denies history of Neuropathy, Paraplegia, Seizure Disorder Psychiatric Denies history of [place] Confinement Anxiety [name], [name] ([id number]) [id number] Page 8 of 13 Hospitalization/Surgery History - Orif patella right- 2015. - 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. - Breast surgery- unknown. Medical A Surgical History Notes nd Genitourinary polyneuropathy Review of Systems (ROS) Constitutional Symptoms (General Health) Denies complaints or symptoms of Fatigue, Fever, Chills, Marked Weight Change. Eyes Denies complaints or symptoms of Dry Eyes, Vision Changes, Glasses / Contacts. Ear/Nose/Mouth/Throat Denies complaints or symptoms of Chronic sinus problems or rhinitis. Respiratory Denies complaints or symptoms of Chronic or frequent coughs, Shortness of Breath. Gastrointestinal Denies complaints or symptoms of Frequent diarrhea, Nausea, Vomiting. Endocrine Denies complaints or symptoms of Heat/cold intolerance. Genitourinary Denies complaints or symptoms of Frequent urination. Musculoskeletal Complains or has symptoms of Muscle Weakness. Oncologic Breast cancer Psychiatric Denies complaints or symptoms of Claustrophobia. Objective Constitutional No acute distress. Vitals Time Taken: 10:54 AM, Height: 66 in, Source: Stated, Weight:  133 lbs, Source: Stated, BMI: 21.5, Temperature: 98.7 F, Pulse: 90 bpm, Respiratory Rate: 16 breaths/min, Blood Pressure: 133/78 mmHg. Respiratory Normal work of breathing on room air.. Cardiovascular Distal pulses nonpalpable. General Notes: 03/01/2022: There are multiple lesions open on her left lower leg, including a cluster of 3 just above the lateral ankle, 1 on her posterior calf, and another on the medial lower leg. They all have thick slough and eschar present. The skin of her leg is discolored and blue. Integumentary (Hair, Skin) Wound #1 status is Open. Original cause of wound was Skin T ear/Laceration. The date acquired was: 02/08/2022. The wound is located on the Left,Posterior Lower Leg. The wound measures 4.8cm length x 2.4cm width x 0.1cm depth; 9.048cm^2 area and 0.905cm^3 volume. There is Fat Layer (Subcutaneous Tissue) exposed. There is no tunneling or undermining noted. There is a medium amount of serous drainage noted. There is medium (34-66%) red, pink granulation within the wound bed. There is a medium (34-66%) amount of necrotic tissue within the wound bed. Wound #2 status is Open. Original cause of wound was Skin T ear/Laceration. The date acquired was: 02/04/2022. The wound is located on the Left,Posterior,Superior Lower Leg. The wound measures 7.2cm length x 1.6cm width x 0.1cm depth; 9.048cm^2 area and 0.905cm^3 volume. There is no tunneling or undermining noted. There is a medium amount of serous drainage noted. There is medium (34-66%) pink granulation within the wound bed. There is a medium (34-66%) amount of necrotic tissue within the wound bed including Eschar. Wound #3 status is Open. Original cause of wound was Skin Tear/Laceration. The date acquired was: 02/04/2022. The wound is located on the Left,Medial Lower Leg. The wound measures 0.7cm length x 1cm width x 0.1cm depth; 0.55cm^2 area and 0.055cm^3 volume. There is no tunneling or undermining noted. There  is a medium amount of serous drainage noted. The wound margin is flat and intact. There is medium (34-66%) red, pink granulation within the wound bed. There is a medium (34-66%) amount of necrotic tissue within the wound bed including Eschar. The periwound skin appearance exhibited: Dry/Scaly. Assessment Active Problems ICD-10 Non-pressure chronic ulcer of other part of left lower leg with fat layer exposed Acute embolism and thrombosis of other specified deep vein of left lower extremity Dementia in other diseases classified elsewhere, unspecified severity, without behavioral disturbance, psychotic disturbance, mood disturbance, and anxiety [name], [name] ([id number]) [id number] Page  9 of 13 Procedures Wound #1 Pre-procedure diagnosis of Wound #1 is an Atypical located on the Left,Posterior Lower Leg . There was a Excisional Skin/Subcutaneous Tissue Debridement with a total area of 11.52 sq cm performed by Fredirick Maudlin, MD. With the following instrument(s): Curette to remove Non-Viable tissue/material. Material removed includes Subcutaneous Tissue and Slough and. No specimens were taken. A time out was conducted at 11:43, prior to the start of the procedure. A Minimum amount of bleeding was controlled with Pressure. The procedure was tolerated well. Post Debridement Measurements: 4.8cm length x 2.4cm width x 0.1cm depth; 0.905cm^3 volume. Character of Wound/Ulcer Post Debridement requires further debridement. Post procedure Diagnosis Wound #1: Same as Pre-Procedure General Notes: Scribed for Dr. Celine Ahr by Blanche East, RN. Wound #2 Pre-procedure diagnosis of Wound #2 is an Atypical located on the Left,Posterior,Superior Lower Leg . There was a Excisional Skin/Subcutaneous Tissue Debridement with a total area of 11.52 sq cm performed by Fredirick Maudlin, MD. With the following instrument(s): Curette to remove Non-Viable tissue/material. Material removed  includes Subcutaneous Tissue and Slough and. No specimens were taken. A time out was conducted at 11:43, prior to the start of the procedure. A Minimum amount of bleeding was controlled with Pressure. The procedure was tolerated well. Post Debridement Measurements: 7.2cm length x 1.6cm width x 0.1cm depth; 0.905cm^3 volume. Character of Wound/Ulcer Post Debridement requires further debridement. Post procedure Diagnosis Wound #2: Same as Pre-Procedure General Notes: Scribed for Dr. Celine Ahr by Blanche East, RN. Wound #3 Pre-procedure diagnosis of Wound #3 is an Atypical located on the Left,Medial Lower Leg . There was a Selective/Open Wound Non-Viable Tissue Debridement with a total area of 0.7 sq cm performed by Fredirick Maudlin, MD. With the following instrument(s): Curette to remove Non-Viable tissue/material. Material removed includes Eschar and Slough and. No specimens were taken. A time out was conducted at 11:43, prior to the start of the procedure. A Minimum amount of bleeding was controlled with Pressure. The procedure was tolerated well. Post Debridement Measurements: 0.7cm length x 1cm width x 0.1cm depth; 0.055cm^3 volume. Character of Wound/Ulcer Post Debridement requires further debridement. Post procedure Diagnosis Wound #3: Same as Pre-Procedure General Notes: Scribed for Dr. Celine Ahr by Blanche East, RN. Plan Follow-up Appointments: Return Appointment in 1 week. - Dr. Celine Ahr Rm 4 Tuesday 03/07/22 at 10:45 AM Anesthetic: Wound #1 Left,Posterior Lower Leg: (In clinic) Topical Lidocaine 5% applied to wound bed Wound #2 Left,Posterior,Superior Lower Leg: (In clinic) Topical Lidocaine 5% applied to wound bed Wound #3 Left,Medial Lower Leg: (In clinic) Topical Lidocaine 5% applied to wound bed Bathing/ Shower/ Hygiene: May shower with protection but do not get wound dressing(s) wet. - Keep dressing dry at all times, May purchase a cast protector or sponge bath Edema Control -  Lymphedema / SCD / Other: Elevate legs to the level of the heart or above for 30 minutes daily and/or when sitting, a frequency of: Avoid standing for long periods of time. ordered were: Vascular and vein specialist - ABI 0.6 in clinic, referral for formal ABIs WOUND #1: - Lower Leg Wound Laterality: Left, Posterior Cleanser: Soap and Water Discharge Instructions: May shower and wash wound with dial antibacterial soap and water prior to dressing change. Peri-Wound Care: Sween Lotion (Moisturizing lotion) Discharge Instructions: Apply moisturizing lotion as directed Prim Dressing: IODOFLEX 0.9% Cadexomer Iodine Pad 4x6 cm ary Discharge Instructions: Apply to wound bed as instructed Secondary Dressing: Zetuvit Plus 4x8 in Discharge Instructions: Apply over primary dressing as directed. Com pression Wrap: Kerlix  Roll 4.5x3.1 (in/yd) Discharge Instructions: Apply Kerlix and Coban compression as directed. Com pression Wrap: Coban Self-Adherent Wrap 4x5 (in/yd) Discharge Instructions: Apply over Kerlix as directed. WOUND #2: - Lower Leg Wound Laterality: Left, Posterior, Superior Cleanser: Soap and Water Discharge Instructions: May shower and wash wound with dial antibacterial soap and water prior to dressing change. Peri-Wound Care: Sween Lotion (Moisturizing lotion) Discharge Instructions: Apply moisturizing lotion as directed Prim Dressing: IODOFLEX 0.9% Cadexomer Iodine Pad 4x6 cm ary Discharge Instructions: Apply to wound bed as instructed Secondary Dressing: Zetuvit Plus 4x8 in Discharge Instructions: Apply over primary dressing as directed. Com pression Wrap: Kerlix Roll 4.5x3.1 (in/yd) Discharge Instructions: Apply Kerlix and Coban compression as directed. Com pression Wrap: Coban Self-Adherent Wrap 4x5 (in/yd) Discharge Instructions: Apply over Kerlix as directed. WOUND #3: - Lower Leg Wound Laterality: Left, Medial Cleanser: Soap and Water Discharge Instructions: May shower and  wash wound with dial antibacterial soap and water prior to dressing change. Peri-Wound Care: Sween Lotion (Moisturizing lotion) Discharge Instructions: Apply moisturizing lotion as directed Prim Dressing: IODOFLEX 0.9% Cadexomer Iodine Pad 4x6 cm ary Discharge Instructions: Apply to wound bed as instructed Secondary Dressing: Zetuvit Plus 4x8 in Discharge Instructions: Apply over primary dressing as directed. Com pression Wrap: Kerlix Roll 4.5x3.1 (in/yd) [name], [name] ([id number]) [id number] Page 10 of 13 Discharge Instructions: Apply Kerlix and Coban compression as directed. Compression Wrap: Coban Self-Adherent Wrap 4x5 (in/yd) Discharge Instructions: Apply over Kerlix as directed. 03/01/2022: There are multiple lesions open on her left lower leg, including a cluster of 3 just above the lateral ankle, 1 on her posterior calf, and another on the medial lower leg. They all have thick slough and eschar present. The skin of her leg is discolored and blue. I used a curette to debride slough, eschar, and nonviable subcutaneous tissue from her wounds. We will apply Iodoflex for ongoing chemical debridement of the wounds. Kerlix and Coban wrapping. We will refer her to vascular surgery for formal ABIs due to the diminished findings in clinic today. Follow-up in 1 week. Electronic Signature(s) Signed: 03/01/2022 12:37:50 PM By: Fredirick Maudlin MD FACS Entered By: Fredirick Maudlin on 03/01/2022 12:37:50 -------------------------------------------------------------------------------- HxROS Details Patient Name: Date of Service: [name], Cynthia ELYN Mitchell. 03/01/2022 10:30 A M Medical Record Number: [id number] Patient Account Number: [id number] Date of Birth/Sex: Treating RN: [birth year] (88 y.o. Marta Lamas Primary Care Provider: Deland Pretty Other Clinician: Referring Provider: Treating Provider/Extender: Wilford Corner in Treatment:  0 Information Obtained From Patient Constitutional Symptoms (General Health) Complaints and Symptoms: Negative for: Fatigue; Fever; Chills; Marked Weight Change Eyes Complaints and Symptoms: Negative for: Dry Eyes; Vision Changes; Glasses / Contacts Medical History: Negative for: Cataracts; Glaucoma; Optic Neuritis Ear/Nose/Mouth/Throat Complaints and Symptoms: Negative for: Chronic sinus problems or rhinitis Medical History: Negative for: Chronic sinus problems/congestion; Middle ear problems Respiratory Complaints and Symptoms: Negative for: Chronic or frequent coughs; Shortness of Breath Medical History: Negative for: Aspiration; Asthma; Chronic Obstructive Pulmonary Disease (COPD); Pneumothorax; Sleep Apnea; Tuberculosis Gastrointestinal Complaints and Symptoms: Negative for: Frequent diarrhea; Nausea; Vomiting Medical History: Negative for: Cirrhosis ; Colitis; Crohns; Hepatitis A; Hepatitis B Endocrine Complaints and Symptoms: [name], [name] ([id number]) [id number] Page 11 of 13 Negative for: Heat/cold intolerance Medical History: Negative for: Type I Diabetes; Type II Diabetes Genitourinary Complaints and Symptoms: Negative for: Frequent urination Medical History: Negative for: End Stage Renal Disease Past Medical History Notes: polyneuropathy Musculoskeletal Complaints and Symptoms: Positive for: Muscle Weakness Medical History: Negative for: Gout; Rheumatoid Arthritis; Osteoarthritis;  Osteomyelitis Psychiatric Complaints and Symptoms: Negative for: Claustrophobia Medical History: Negative for: Anorexia/bulimia; Confinement Anxiety Hematologic/Lymphatic Medical History: Negative for: Anemia; Hemophilia; Human Immunodeficiency Virus; Lymphedema; Sickle Cell Disease Cardiovascular Medical History: Positive for: Hypertension Integumentary (Skin) Medical History: Negative for: History of Burn Neurologic Medical  History: Positive for: Dementia Negative for: Neuropathy; Paraplegia; Seizure Disorder Oncologic Complaints and Symptoms: Review of System Notes: Breast cancer Immunizations Pneumococcal Vaccine: Received Pneumococcal Vaccination: Yes Received Pneumococcal Vaccination On or After 60th Birthday: Yes Implantable Devices None Hospitalization / Surgery History Type of Hospitalization/Surgery Orif 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 Breast surgery- unknown Family and Social History Unknown History: Yes; Never smoker; Marital Status - Married; Alcohol Use: Never; Drug Use: No History; Caffeine Use: Rarely; Financial Concerns: No; Food, Clothing or Shelter Needs: No; Support System Lacking: No; Transportation Concerns: No Electronic Signature(s) [name], [name] ([id number]) [id number] Page 12 of 13 Signed: 03/01/2022 12:41:41 PM By: Fredirick Maudlin MD FACS Signed: 03/01/2022 5:14:53 PM By: Blanche East RN Entered By: Blanche East on 03/01/2022 11:06:30 -------------------------------------------------------------------------------- SuperBill Details Patient Name: Date of Service: [name], [name] 03/01/2022 Medical Record Number: [id number] Patient Account Number: [id number] Date of Birth/Sex: Treating RN: 1931-08-27 (88 y.o. F) Primary Care Provider: Deland Pretty Other Clinician: Referring Provider: Treating Provider/Extender: Wilford Corner in Treatment: 0 Diagnosis Coding ICD-10 Codes Code Description [id number] Non-pressure chronic ulcer of other part of left lower leg with fat layer exposed I82.492 Acute embolism and thrombosis of other specified deep vein of left lower extremity Dementia in other diseases classified elsewhere, unspecified severity, without behavioral disturbance, psychotic disturbance, mood F02.80 disturbance, and anxiety Facility Procedures : CPT4 Code:  [id number] Description: [facility] VISIT-LEV 3 EST PT Modifier: 25 Quantity: 1 : CPT4 Code: [id number] Description: 09628 - DEB SUBQ TISSUE 20 SQ CM/< ICD-10 Diagnosis Description L97.822 Non-pressure chronic ulcer of other part of left lower leg with fat layer expose Modifier: d Quantity: 1 : CPT4 Code: 36629476 Description: 54650 - DEB SUBQ TISS EA ADDL 20CM ICD-10 Diagnosis Description L97.822 Non-pressure chronic ulcer of other part of left lower leg with fat layer expose Modifier: d Quantity: 1 : CPT4 Code: [id number] Description: 27517 - DEBRIDE WOUND 1ST 20 SQ CM OR < ICD-10 Diagnosis Description L97.822 Non-pressure chronic ulcer of other part of left lower leg with fat layer expose Modifier: d Quantity: 1 Physician Procedures : CPT4: Description Modifier Code [id number] 49675 - WC PHYS LEVEL 4 - NEW PT 25 ICD-10 Diagnosis Description L97.822 Non-pressure chronic ulcer of other part of left lower leg with fat layer exposed I82.492 Acute embolism and thrombosis of other specified  deep vein of left lower extremity F02.80 Dementia in other diseases classified elsewhere, unspecified severity, without behavioral disturbance, p disturbance, mood disturbance, and anxiety Quantity: 1 sychotic : CPT4: 9163846 11042 - WC PHYS SUBQ TISS 20 SQ CM ICD-10 Diagnosis Description L97.822 Non-pressure chronic ulcer of other part of left lower leg with fat layer exposed Quantity: 1 : CPT4: [id number] 11045 - WC PHYS SUBQ TISS EA ADDL 20 CM ICD-10 Diagnosis Description L97.822 Non-pressure chronic ulcer of other part of left lower leg with fat layer exposed Quantity: 1 Electronic Signature(s) Signed: 03/01/2022 5:14:53 PM By: Blanche East RN Signed: 03/01/2022 5:18:05 PM By: Fredirick Maudlin MD FACS Previous Signature: 03/01/2022 12:38:10 PM Version By: Fredirick Maudlin MD FACS Entered By: Blanche East on 03/01/2022 16:42:32

## 2022-03-07 ENCOUNTER — Encounter (HOSPITAL_BASED_OUTPATIENT_CLINIC_OR_DEPARTMENT_OTHER): Payer: Medicare PPO | Admitting: General Surgery

## 2022-03-07 DIAGNOSIS — I82492 Acute embolism and thrombosis of other specified deep vein of left lower extremity: Secondary | ICD-10-CM | POA: Diagnosis not present

## 2022-03-07 DIAGNOSIS — Z86718 Personal history of other venous thrombosis and embolism: Secondary | ICD-10-CM | POA: Diagnosis not present

## 2022-03-07 DIAGNOSIS — Z7901 Long term (current) use of anticoagulants: Secondary | ICD-10-CM | POA: Diagnosis not present

## 2022-03-07 DIAGNOSIS — I10 Essential (primary) hypertension: Secondary | ICD-10-CM | POA: Diagnosis not present

## 2022-03-07 DIAGNOSIS — I872 Venous insufficiency (chronic) (peripheral): Secondary | ICD-10-CM | POA: Diagnosis not present

## 2022-03-07 DIAGNOSIS — Z940 Kidney transplant status: Secondary | ICD-10-CM | POA: Diagnosis not present

## 2022-03-07 DIAGNOSIS — F0280 Dementia in other diseases classified elsewhere without behavioral disturbance: Secondary | ICD-10-CM | POA: Diagnosis not present

## 2022-03-07 DIAGNOSIS — L97822 Non-pressure chronic ulcer of other part of left lower leg with fat layer exposed: Secondary | ICD-10-CM | POA: Diagnosis not present

## 2022-03-07 DIAGNOSIS — L97222 Non-pressure chronic ulcer of left calf with fat layer exposed: Secondary | ICD-10-CM | POA: Diagnosis not present

## 2022-03-07 DIAGNOSIS — D84821 Immunodeficiency due to drugs: Secondary | ICD-10-CM | POA: Diagnosis not present

## 2022-03-08 DIAGNOSIS — H0014 Chalazion left upper eyelid: Secondary | ICD-10-CM | POA: Diagnosis not present

## 2022-03-13 ENCOUNTER — Ambulatory Visit (HOSPITAL_COMMUNITY)
Admission: RE | Admit: 2022-03-13 | Discharge: 2022-03-13 | Disposition: A | Discharge status: Home / Self Care | Payer: Medicare PPO | Source: Ambulatory Visit | Attending: Surgery | Admitting: Surgery

## 2022-03-13 ENCOUNTER — Other Ambulatory Visit (HOSPITAL_COMMUNITY): Payer: Self-pay | Admitting: General Surgery

## 2022-03-13 DIAGNOSIS — L97909 Non-pressure chronic ulcer of unspecified part of unspecified lower leg with unspecified severity: Secondary | ICD-10-CM | POA: Diagnosis not present

## 2022-03-13 NOTE — Progress Notes (Signed)
[name], [name] ([id number]) [id number] Page 1 of 11 Visit Report for 03/07/2022 Chief Complaint Document Details Patient Name: Date of Service: [name] 03/07/2022 10:45 A M Medical Record Number: [id number] Patient Account Number: [id number] Date of Birth/Sex: Treating RN: 1931/07/03 (88 y.o. F) Primary Care Provider: Deland Pretty Other Clinician: Referring Provider: Treating Provider/Extender: Raylene Everts in Treatment: 0 Information Obtained from: Patient Chief Complaint Patient seen for complaints of Non-Healing Wounds. Electronic Signature(s) Signed: 03/07/2022 12:26:05 PM By: Fredirick Maudlin MD FACS Entered By: Fredirick Maudlin on 03/07/2022 12:26:05 -------------------------------------------------------------------------------- Debridement Details Patient Name: Date of Service: Cynthia [name], [name]. 03/07/2022 10:45 A M Medical Record Number: [id number] Patient Account Number: [id number] Date of Birth/Sex: Treating RN: Jul 25, 1931 (88 y.o. Iver Nestle, [place] Primary Care Provider: Deland Pretty Other Clinician: Referring Provider: Treating Provider/Extender: Raylene Everts in Treatment: 0 Debridement Performed for Assessment: Wound #1 Left,Posterior Lower Leg Performed By: Physician Fredirick Maudlin, MD Debridement Type: Debridement Severity of Tissue Pre Debridement: Fat layer exposed Level of Consciousness (Pre-procedure): Awake and Alert Pre-procedure Verification/Time Out Yes - 11:26 Taken: Start Time: 11:27 Pain Control: Lidocaine 5% topical ointment T Area Debrided (L x W): otal 4.2 (cm) x 2 (cm) = 8.4 (cm) Tissue and other material debrided: Non-Viable, Eschar, Slough, Slough Level: Non-Viable Tissue Debridement Description: Selective/Open Wound Instrument: Curette Bleeding: Minimum Hemostasis Achieved: Pressure Response to Treatment: Procedure was tolerated well Level of  Consciousness (Post- Awake and Alert procedure): Post Debridement Measurements of Total Wound Length: (cm) 4.2 Width: (cm) 2 Depth: (cm) 0.1 Volume: (cm) 0.66 Character of Wound/Ulcer Post Debridement: Requires Further Debridement Severity of Tissue Post Debridement: Fat layer exposed Post Procedure Diagnosis Same as Pre-procedure [name], [name] ([id number]) [id number] Page 2 of 11 Notes Scribed for Dr. Celine Ahr by Blanche East, RN Electronic Signature(s) Signed: 03/07/2022 12:59:49 PM By: Fredirick Maudlin MD FACS Signed: 03/13/2022 4:55:50 PM By: Blanche East RN Entered By: Blanche East on 03/07/2022 11:30:03 -------------------------------------------------------------------------------- Debridement Details Patient Name: Date of Service: Cynthia [name], [name]. 03/07/2022 10:45 A M Medical Record Number: [id number] Patient Account Number: [id number] Date of Birth/Sex: Treating RN: Sep 02, 1931 (88 y.o. Iver Nestle, [place] Primary Care Provider: Deland Pretty Other Clinician: Referring Provider: Treating Provider/Extender: Raylene Everts in Treatment: 0 Debridement Performed for Assessment: Wound #2 Left,Posterior,Superior Lower Leg Performed By: Physician Fredirick Maudlin, MD Debridement Type: Debridement Severity of Tissue Pre Debridement: Fat layer exposed Level of Consciousness (Pre-procedure): Awake and Alert Pre-procedure Verification/Time Out Yes - 11:26 Taken: Start Time: 11:27 Pain Control: Lidocaine 5% topical ointment T Area Debrided (L x W): otal 6.8 (cm) x 1.5 (cm) = 10.2 (cm) Tissue and other material debrided: Non-Viable, Eschar, Slough, Slough Level: Non-Viable Tissue Debridement Description: Selective/Open Wound Instrument: Curette Bleeding: Minimum Hemostasis Achieved: Pressure Response to Treatment: Procedure was tolerated well Level of Consciousness (Post- Awake and Alert procedure): Post Debridement  Measurements of Total Wound Length: (cm) 6.8 Width: (cm) 1.5 Depth: (cm) 0.1 Volume: (cm) 0.801 Character of Wound/Ulcer Post Debridement: Requires Further Debridement Severity of Tissue Post Debridement: Fat layer exposed Post Procedure Diagnosis Same as Pre-procedure Notes Scribed for Dr. Celine Ahr by Blanche East, RN Electronic Signature(s) Signed: 03/07/2022 12:59:49 PM By: Fredirick Maudlin MD FACS Signed: 03/13/2022 4:55:50 PM By: Blanche East RN Entered By: Blanche East on 03/07/2022 11:30:18 Debridement Details -------------------------------------------------------------------------------- [name] ([id number]) [id number] Page 3 of 11 Patient Name: Date of Service: Cynthia [name], [name] 03/07/2022 10:45 A M Medical  Record Number: [id number] Patient Account Number: [id number] Date of Birth/Sex: Treating RN: [birth year] (88 y.o. Iver Nestle, [place] Primary Care Provider: Deland Pretty Other Clinician: Referring Provider: Treating Provider/Extender: Raylene Everts in Treatment: 0 Debridement Performed for Assessment: Wound #3 Left,Medial Lower Leg Performed By: Physician Fredirick Maudlin, MD Debridement Type: Debridement Severity of Tissue Pre Debridement: Fat layer exposed Level of Consciousness (Pre-procedure): Awake and Alert Pre-procedure Verification/Time Out Yes - 11:26 Taken: Start Time: 11:27 Pain Control: Lidocaine 5% topical ointment T Area Debrided (L x W): otal 0.8 (cm) x 1 (cm) = 0.8 (cm) Tissue and other material debrided: Non-Viable, Eschar, Slough, Slough Level: Non-Viable Tissue Debridement Description: Selective/Open Wound Instrument: Curette Bleeding: Minimum Hemostasis Achieved: Pressure Response to Treatment: Procedure was tolerated well Level of Consciousness (Post- Awake and Alert procedure): Post Debridement Measurements of Total Wound Length: (cm) 0.8 Width: (cm) 1 Depth: (cm)  0.1 Volume: (cm) 0.063 Character of Wound/Ulcer Post Debridement: Requires Further Debridement Severity of Tissue Post Debridement: Fat layer exposed Post Procedure Diagnosis Same as Pre-procedure Notes Scribed for Dr. Celine Ahr by Blanche East, RN Electronic Signature(s) Signed: 03/07/2022 12:59:49 PM By: Fredirick Maudlin MD FACS Signed: 03/13/2022 4:55:50 PM By: Blanche East RN Entered By: Blanche East on 03/07/2022 11:30:35 -------------------------------------------------------------------------------- HPI Details Patient Name: Date of Service: Cynthia [name], [name]. 03/07/2022 10:45 A M Medical Record Number: [id number] Patient Account Number: [id number] Date of Birth/Sex: Treating RN: 1932-02-02 (88 y.o. F) Primary Care Provider: Deland Pretty Other Clinician: Referring Provider: Treating Provider/Extender: Raylene Everts in Treatment: 0 History of Present Illness HPI Description: ADMISSION 03/01/2022 This is an 88 year old woman with dementia, residing in a memory care facility. She has a past medical history significant for renal failure, status post kidney transplant and now off dialysis. She is on chronic immunosuppression with tacrolimus and prednisone. She was recently admitted with an extensive DVT of the left lower extremity, identified when her nursing facility staff noticed extreme swelling of her left lower leg. She was initiated on Eliquis. Vascular surgery saw her and recommended compression stockings. Unfortunately, her skin is so thin that the stockings, combined with the leg swelling, resulted in multiple wounds opening up on her left lower leg. ABI in clinic today was only 0.61. There are multiple lesions open on her leg, including a cluster of 3 just above the lateral ankle, 1 on her posterior calf, and another on the medial lower leg. They all have thick slough and eschar present. The skin of her leg is discolored and blue. 03/07/2022: All  of the wounds are cleaner today. They have slough present but the eschar has softened. Edema control is good. [name], [name] ([id number]) [id number] Page 4 of 11 Electronic Signature(s) Signed: 03/07/2022 12:28:38 PM By: Fredirick Maudlin MD FACS Entered By: Fredirick Maudlin on 03/07/2022 12:28:36 -------------------------------------------------------------------------------- Physical Exam Details Patient Name: Date of Service: Cynthia [name], [name]. 03/07/2022 10:45 A M Medical Record Number: [id number] Patient Account Number: [id number] Date of Birth/Sex: Treating RN: Jul 30, 1931 (88 y.o. F) Primary Care Provider: Deland Pretty Other Clinician: Referring Provider: Treating Provider/Extender: Raylene Everts in Treatment: 0 Constitutional Within normal range for this patient.. . . . No acute distress. Respiratory Normal work of breathing on room air. Notes 03/07/2022: All of the wounds are cleaner today. They have slough present but the eschar has softened. Edema control is good. Electronic Signature(s) Signed: 03/07/2022 12:29:16 PM By: Fredirick Maudlin MD FACS Entered By: Fredirick Maudlin on 03/07/2022 12:29:16 --------------------------------------------------------------------------------  Physician Orders Details Patient Name: Date of Service: [name] 03/07/2022 10:45 A M Medical Record Number: [id number] Patient Account Number: [id number] Date of Birth/Sex: Treating RN: 1931/11/19 (88 y.o. Iver Nestle, [place] Primary Care Provider: Deland Pretty Other Clinician: Referring Provider: Treating Provider/Extender: Raylene Everts in Treatment: 0 Verbal / Phone Orders: No Diagnosis Coding ICD-10 Coding Code Description [phone] Non-pressure chronic ulcer of other part of left lower leg with fat layer exposed I82.492 Acute embolism and thrombosis of other specified deep vein of left lower  extremity Dementia in other diseases classified elsewhere, unspecified severity, without behavioral disturbance, psychotic disturbance, mood F02.80 disturbance, and anxiety Follow-up Appointments ppointment in 1 week. - Dr. Celine Ahr Rm 4 Return A Tuesday 03/14/22 at 10:45AM Anesthetic Wound #1 Left,Posterior Lower Leg (In clinic) Topical Lidocaine 5% applied to wound bed Wound #2 Left,Posterior,Superior Lower Leg (In clinic) Topical Lidocaine 5% applied to wound bed Wound #3 Left,Medial Lower Leg [name], [name] ([id number]) [id number] Page 5 of 11 (In clinic) Topical Lidocaine 5% applied to wound bed Bathing/ Shower/ Hygiene May shower with protection but do not get wound dressing(s) wet. - Keep dressing dry at all times, May purchase a cast protector or sponge bath Edema Control - Lymphedema / SCD / Other Elevate legs to the level of the heart or above for 30 minutes daily and/or when sitting, a frequency of: Avoid standing for long periods of time. Wound Treatment Wound #1 - Lower Leg Wound Laterality: Left, Posterior Cleanser: Soap and Water Discharge Instructions: May shower and wash wound with dial antibacterial soap and water prior to dressing change. Peri-Wound Care: Sween Lotion (Moisturizing lotion) Discharge Instructions: Apply moisturizing lotion as directed Prim Dressing: IODOFLEX 0.9% Cadexomer Iodine Pad 4x6 cm ary Discharge Instructions: Apply to wound bed as instructed Secondary Dressing: Zetuvit Plus 4x8 in Discharge Instructions: Apply over primary dressing as directed. Compression Wrap: Kerlix Roll 4.5x3.1 (in/yd) Discharge Instructions: Apply Kerlix and Coban compression as directed. Compression Wrap: Coban Self-Adherent Wrap 4x5 (in/yd) Discharge Instructions: Apply over Kerlix as directed. Wound #2 - Lower Leg Wound Laterality: Left, Posterior, Superior Cleanser: Soap and Water Discharge Instructions: May shower and wash wound  with dial antibacterial soap and water prior to dressing change. Peri-Wound Care: Sween Lotion (Moisturizing lotion) Discharge Instructions: Apply moisturizing lotion as directed Prim Dressing: IODOFLEX 0.9% Cadexomer Iodine Pad 4x6 cm ary Discharge Instructions: Apply to wound bed as instructed Secondary Dressing: Zetuvit Plus 4x8 in Discharge Instructions: Apply over primary dressing as directed. Compression Wrap: Kerlix Roll 4.5x3.1 (in/yd) Discharge Instructions: Apply Kerlix and Coban compression as directed. Compression Wrap: Coban Self-Adherent Wrap 4x5 (in/yd) Discharge Instructions: Apply over Kerlix as directed. Wound #3 - Lower Leg Wound Laterality: Left, Medial Cleanser: Soap and Water Discharge Instructions: May shower and wash wound with dial antibacterial soap and water prior to dressing change. Peri-Wound Care: Sween Lotion (Moisturizing lotion) Discharge Instructions: Apply moisturizing lotion as directed Prim Dressing: IODOFLEX 0.9% Cadexomer Iodine Pad 4x6 cm ary Discharge Instructions: Apply to wound bed as instructed Secondary Dressing: Zetuvit Plus 4x8 in Discharge Instructions: Apply over primary dressing as directed. Compression Wrap: Kerlix Roll 4.5x3.1 (in/yd) Discharge Instructions: Apply Kerlix and Coban compression as directed. Compression Wrap: Coban Self-Adherent Wrap 4x5 (in/yd) Discharge Instructions: Apply over Kerlix as directed. Electronic Signature(s) Signed: 03/07/2022 12:59:49 PM By: Fredirick Maudlin MD FACS Entered By: Fredirick Maudlin on 03/07/2022 12:29:35 [place], [name] ([id number]) [id number] Page 6 of 11 -------------------------------------------------------------------------------- Problem List Details Patient Name: Date of Service: Cynthia  [name], [name]. 03/07/2022 10:45 A M Medical Record Number: [id number] Patient Account Number: [id number] Date of Birth/Sex: Treating RN: 1932-03-17 (88 y.o. F) Primary  Care Provider: Deland Pretty Other Clinician: Referring Provider: Treating Provider/Extender: Raylene Everts in Treatment: 0 Active Problems ICD-10 Encounter Code Description Active Date MDM Diagnosis [phone] Non-pressure chronic ulcer of other part of left lower leg with fat layer 03/01/2022 No Yes exposed I82.492 Acute embolism and thrombosis of other specified deep vein of left lower 03/01/2022 No Yes extremity F02.80 Dementia in other diseases classified elsewhere, unspecified severity, without 03/01/2022 No Yes behavioral disturbance, psychotic disturbance, mood disturbance, and anxiety Inactive Problems Resolved Problems Electronic Signature(s) Signed: 03/07/2022 12:24:25 PM By: Fredirick Maudlin MD FACS Entered By: Fredirick Maudlin on 03/07/2022 12:24:24 -------------------------------------------------------------------------------- Progress Note Details Patient Name: Date of Service: Cynthia [name], [name]. 03/07/2022 10:45 A M Medical Record Number: [id number] Patient Account Number: [id number] Date of Birth/Sex: Treating RN: Feb 11, 1932 (88 y.o. F) Primary Care Provider: Deland Pretty Other Clinician: Referring Provider: Treating Provider/Extender: Raylene Everts in Treatment: 0 Subjective Chief Complaint Information obtained from Patient Patient seen for complaints of Non-Healing Wounds. History of Present Illness (HPI) ADMISSION 03/01/2022 This is an 88 year old woman with dementia, residing in a memory care facility. She has a past medical history significant for renal failure, status post kidney transplant and now off dialysis. She is on chronic immunosuppression with tacrolimus and prednisone. She was recently admitted with an extensive DVT of the left lower extremity, identified when her nursing facility staff noticed extreme swelling of her left lower leg. She was initiated on Eliquis. Vascular surgery saw her and  recommended compression stockings. Unfortunately, her skin is so thin that the stockings, combined with the leg swelling, resulted in multiple wounds opening up on her left lower leg. ABI in clinic today was only 0.61. [name], [name] ([id number]) [id number] Page 7 of 11 There are multiple lesions open on her leg, including a cluster of 3 just above the lateral ankle, 1 on her posterior calf, and another on the medial lower leg. They all have thick slough and eschar present. The skin of her leg is discolored and blue. 03/07/2022: All of the wounds are cleaner today. They have slough present but the eschar has softened. Edema control is good. Patient History Information obtained from Patient. Family History Unknown History. Social History Never smoker, Marital Status - Married, Alcohol Use - Never, Drug Use - No History, Caffeine Use - Rarely. Medical History Eyes Denies history of Cataracts, Glaucoma, Optic Neuritis Ear/Nose/Mouth/Throat Denies history of Chronic sinus problems/congestion, Middle ear problems Hematologic/Lymphatic Denies history of Anemia, Hemophilia, Human Immunodeficiency Virus, Lymphedema, Sickle Cell Disease Respiratory Denies history of Aspiration, Asthma, Chronic Obstructive Pulmonary Disease (COPD), Pneumothorax, Sleep Apnea, Tuberculosis Cardiovascular Patient has history of Hypertension Gastrointestinal Denies history of Cirrhosis , Colitis, Crohnoos, Hepatitis A, Hepatitis B Endocrine Denies history of Type I Diabetes, Type II Diabetes Genitourinary Denies history of End Stage Renal Disease Integumentary (Skin) Denies history of History of Burn Musculoskeletal Denies history of Gout, Rheumatoid Arthritis, Osteoarthritis, Osteomyelitis Neurologic Patient has history of Dementia Denies history of Neuropathy, Paraplegia, Seizure Disorder Psychiatric Denies history of Anorexia/bulimia, Confinement  Anxiety Hospitalization/Surgery History - Orif patella right- 2015. - 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. - Breast surgery- unknown. Medical A Surgical History Notes nd Genitourinary polyneuropathy Objective Constitutional Within normal range for this patient.. No acute distress. Vitals Time Taken: 11:08 AM, Height: 66 in, Weight: 133 lbs, BMI: 21.5,  Temperature: 97.7 F, Pulse: 93 bpm, Respiratory Rate: 16 breaths/min, Blood Pressure: 91/56 mmHg. Respiratory Normal work of breathing on room air. General Notes: 03/07/2022: All of the wounds are cleaner today. They have slough present but the eschar has softened. Edema control is good. Integumentary (Hair, Skin) Wound #1 status is Open. Original cause of wound was Skin T ear/Laceration. The date acquired was: 02/08/2022. The wound is located on the Left,Posterior Lower Leg. The wound measures 4.2cm length x 2cm width x 0.1cm depth; 6.597cm^2 area and 0.66cm^3 volume. There is Fat Layer (Subcutaneous Tissue) exposed. There is no tunneling or undermining noted. There is a medium amount of serous drainage noted. There is medium (34-66%) red, pink granulation within the wound bed. There is a medium (34-66%) amount of necrotic tissue within the wound bed. The periwound skin appearance had no abnormalities noted for texture. The periwound skin appearance had no abnormalities noted for color. The periwound skin appearance exhibited: Dry/Scaly. The periwound skin appearance did not exhibit: Maceration. Periwound temperature was noted as No Abnormality. Wound #2 status is Open. Original cause of wound was Skin T ear/Laceration. The date acquired was: 02/04/2022. The wound is located on the Left,Posterior,Superior Lower Leg. The wound measures 6.8cm length x 1.5cm width x 0.1cm depth; 8.011cm^2 area and 0.801cm^3 volume. There is a medium amount of serous drainage noted. There is medium (34-66%) pink granulation within the wound bed. There is a medium  (34-66%) amount of necrotic tissue within the wound bed including Eschar. The periwound skin appearance had no abnormalities noted for texture. The periwound skin appearance had no abnormalities noted for color. The periwound skin appearance did not exhibit: Dry/Scaly, Maceration. Periwound temperature was noted as No Abnormality. Wound #3 status is Open. Original cause of wound was Skin Tear/Laceration. The date acquired was: 02/04/2022. The wound is located on the Left,Medial Lower Leg. The wound measures 0.8cm length x 1cm width x 0.1cm depth; 0.628cm^2 area and 0.063cm^3 volume. There is no tunneling or undermining noted. There is a medium amount of serous drainage noted. The wound margin is flat and intact. There is medium (34-66%) red, pink granulation within the wound bed. There [name], [name] ([id number]) [id number] Page 8 of 11 is a medium (34-66%) amount of necrotic tissue within the wound bed including Eschar. The periwound skin appearance exhibited: Dry/Scaly. 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 There was a Selective/Open Wound Non-Viable Tissue Debridement with a total area of 8.4 sq cm performed by Fredirick Maudlin, MD. With the following instrument(s): Curette to remove Non-Viable tissue/material. Material removed includes Eschar and Slough and after achieving pain control using Lidocaine 5% topical ointment. A time out was conducted  at 11:26, prior to the start of the procedure. A Minimum amount of bleeding was controlled with Pressure. The procedure was tolerated well. Post Debridement Measurements: 4.2cm length x 2cm width x 0.1cm depth; 0.66cm^3 volume. Character of Wound/Ulcer Post Debridement requires further debridement. Severity of Tissue Post Debridement is: Fat layer exposed. Post procedure Diagnosis Wound #1: Same as Pre-Procedure General Notes: Scribed for Dr. Celine Ahr by  Blanche East, RN. Wound #2 Pre-procedure diagnosis of Wound #2 is a Venous Leg Ulcer located on the Left,Posterior,Superior Lower Leg .Severity of Tissue Pre Debridement is: Fat layer exposed. There was a Selective/Open Wound Non-Viable Tissue Debridement with a total area of 10.2 sq cm performed by Fredirick Maudlin, MD. With the following instrument(s): Curette to remove Non-Viable tissue/material. Material removed includes Eschar and Slough and after achieving pain control using Lidocaine 5% topical ointment. A time out was conducted at 11:26, prior to the start of the procedure. A Minimum amount of bleeding was controlled with Pressure. The procedure was tolerated well. Post Debridement Measurements: 6.8cm length x 1.5cm width x 0.1cm depth; 0.801cm^3 volume. Character of Wound/Ulcer Post Debridement requires further debridement. Severity of Tissue Post Debridement is: Fat layer exposed. Post procedure Diagnosis Wound #2: Same as Pre-Procedure General Notes: Scribed for Dr. Celine Ahr by Blanche East, RN. Wound #3 Pre-procedure diagnosis of Wound #3 is a Venous Leg Ulcer located on the Left,Medial Lower Leg .Severity of Tissue Pre Debridement is: Fat layer exposed. There was a Selective/Open Wound Non-Viable Tissue Debridement with a total area of 0.8 sq cm performed by Fredirick Maudlin, MD. With the following instrument(s): Curette to remove Non-Viable tissue/material. Material removed includes Eschar and Slough and after achieving pain control using  Lidocaine 5% topical ointment. A time out was conducted at 11:26, prior to the start of the procedure. A Minimum amount of bleeding was controlled with Pressure. The procedure was tolerated well. Post Debridement Measurements: 0.8cm length x 1cm width x 0.1cm depth; 0.063cm^3 volume. Character of Wound/Ulcer Post Debridement requires further debridement. Severity of Tissue Post Debridement is: Fat layer exposed. Post procedure Diagnosis Wound #3: Same as Pre-Procedure General Notes: Scribed for Dr. Celine Ahr by Blanche East, RN. Plan Follow-up Appointments: Return Appointment in 1 week. - Dr. Celine Ahr Rm 4 Tuesday 03/14/22 at 10:45AM Anesthetic: Wound #1 Left,Posterior Lower Leg: (In clinic) Topical Lidocaine 5% applied to wound bed Wound #2 Left,Posterior,Superior Lower Leg: (In clinic) Topical Lidocaine 5% applied to wound bed Wound #3 Left,Medial Lower Leg: (In clinic) Topical Lidocaine 5% applied to wound bed Bathing/ Shower/ Hygiene: May shower with protection but do not get wound dressing(s) wet. - Keep dressing dry at all times, May purchase a cast protector or sponge bath Edema Control - Lymphedema / SCD / Other: Elevate legs to the level of the heart or above for 30 minutes daily and/or when sitting, a frequency of: Avoid standing for long periods of time. WOUND #1: - Lower Leg Wound Laterality: Left, Posterior Cleanser: Soap and Water Discharge Instructions: May shower and wash wound with dial antibacterial soap and water prior to dressing change. Peri-Wound Care: Sween Lotion (Moisturizing lotion) Discharge Instructions: Apply moisturizing lotion as directed Prim Dressing: IODOFLEX 0.9% Cadexomer Iodine Pad 4x6 cm ary Discharge Instructions: Apply to wound bed as instructed Secondary Dressing: Zetuvit Plus 4x8 in Discharge Instructions: Apply over primary dressing as directed. Com pression Wrap: Kerlix Roll 4.5x3.1 (in/yd) Discharge Instructions: Apply Kerlix and Coban  compression as directed. Com pression Wrap: Coban Self-Adherent Wrap 4x5 (in/yd) Discharge Instructions: Apply over Kerlix as directed. WOUND #2: - Lower Leg Wound Laterality: Left, Posterior, Superior Cleanser: Soap and Water Discharge Instructions: May shower and wash wound with dial antibacterial soap and water prior to dressing change. [name], [name] ([id number]) [id number] Page 9 of 11 Peri-Wound Care: Sween Lotion (Moisturizing lotion) Discharge Instructions: Apply moisturizing lotion as directed Prim Dressing: IODOFLEX 0.9% Cadexomer Iodine Pad 4x6 cm ary Discharge Instructions: Apply to  wound bed as instructed Secondary Dressing: Zetuvit Plus 4x8 in Discharge Instructions: Apply over primary dressing as directed. Com pression Wrap: Kerlix Roll 4.5x3.1 (in/yd) Discharge Instructions: Apply Kerlix and Coban compression as directed. Com pression Wrap: Coban Self-Adherent Wrap 4x5 (in/yd) Discharge Instructions: Apply over Kerlix as directed. WOUND #3: - Lower Leg Wound Laterality: Left, Medial Cleanser: Soap and Water Discharge Instructions: May shower and wash wound with dial antibacterial soap and water prior to dressing change. Peri-Wound Care: Sween Lotion (Moisturizing lotion) Discharge Instructions: Apply moisturizing lotion as directed Prim Dressing: IODOFLEX 0.9% Cadexomer Iodine Pad 4x6 cm ary Discharge Instructions: Apply to wound bed as instructed Secondary Dressing: Zetuvit Plus 4x8 in Discharge Instructions: Apply over primary dressing as directed. Com pression Wrap: Kerlix Roll 4.5x3.1 (in/yd) Discharge Instructions: Apply Kerlix and Coban compression as directed. Com pression Wrap: Coban Self-Adherent Wrap 4x5 (in/yd) Discharge Instructions: Apply over Kerlix as directed. 03/07/2022: All of the wounds are cleaner today. They have slough present but the eschar has softened. Edema control is good. I used a curette to debride slough and  eschar from all of her wounds. We will continue with another week of Iodoflex with Kerlix and Coban wrapping. We will use the first layer of Unna boot at the top to try and minimize the slippage but she actually did quite well keeping her wrap in place. Follow-up in 1 week. Electronic Signature(s) Signed: 03/07/2022 12:30:16 PM By: Fredirick Maudlin MD FACS Entered By: Fredirick Maudlin on 03/07/2022 12:30:16 -------------------------------------------------------------------------------- HxROS Details Patient Name: Date of Service: Cynthia [name], [name]. 03/07/2022 10:45 A M Medical Record Number: [id number] Patient Account Number: [id number] Date of Birth/Sex: Treating RN: 11-15-1931 (88 y.o. F) Primary Care Provider: Deland Pretty Other Clinician: Referring Provider: Treating Provider/Extender: Raylene Everts in Treatment: 0 Information Obtained From Patient Eyes Medical History: Negative for: Cataracts; Glaucoma; Optic Neuritis Ear/Nose/Mouth/Throat Medical History: Negative for: Chronic sinus problems/congestion; Middle ear problems Hematologic/Lymphatic Medical History: Negative for: Anemia; Hemophilia; Human Immunodeficiency Virus; Lymphedema; Sickle Cell Disease Respiratory Medical History: Negative for: Aspiration; Asthma; Chronic Obstructive Pulmonary Disease (COPD); Pneumothorax; Sleep Apnea; Tuberculosis Cardiovascular Medical HistoryJEANNETT, [name] ([id number]) [id number] Page 10 of 11 Positive for: Hypertension Gastrointestinal Medical History: Negative for: Cirrhosis ; Colitis; Crohns; Hepatitis A; Hepatitis B Endocrine Medical History: Negative for: Type I Diabetes; Type II Diabetes Genitourinary Medical History: Negative for: End Stage Renal Disease Past Medical History Notes: polyneuropathy Integumentary (Skin) Medical History: Negative for: History of Burn Musculoskeletal Medical History: Negative  for: Gout; Rheumatoid Arthritis; Osteoarthritis; Osteomyelitis Neurologic Medical History: Positive for: Dementia Negative for: Neuropathy; Paraplegia; Seizure Disorder Psychiatric Medical History: Negative for: Anorexia/bulimia; Confinement Anxiety Immunizations Pneumococcal Vaccine: Received Pneumococcal Vaccination: Yes Received Pneumococcal Vaccination On or After 60th Birthday: Yes Implantable Devices None Hospitalization / Surgery History Type of Hospitalization/Surgery Orif 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 Breast surgery- unknown Family and Social History Unknown History: Yes; Never smoker; Marital Status - Married; Alcohol Use: Never; Drug Use: No History; Caffeine Use: Rarely; Financial Concerns: No; Food, Clothing or Shelter Needs: No; Support System Lacking: No; Transportation Concerns: No Electronic Signature(s) Signed: 03/07/2022 12:59:49 PM By: Fredirick Maudlin MD FACS Entered By: Fredirick Maudlin on 03/07/2022 12:28:51 -------------------------------------------------------------------------------- SuperBill Details Patient Name: Date of Service: Cynthia [name], [name] 03/07/2022 Medical Record Number: [id number] Patient Account Number: [id number] Date of Birth/Sex: Treating RN: 1931/06/23 (88 y.o. Elysabeth Aust, Nissi Mitchell ([id number]) [id number] Page 11 of 11 Primary Care Provider: Deland Pretty Other Clinician: Referring Provider: Treating  Provider/Extender: Raylene Everts in Treatment: 0 Diagnosis Coding ICD-10 Codes Code Description [id number] Non-pressure chronic ulcer of other part of left lower leg with fat layer exposed I82.492 Acute embolism and thrombosis of other specified deep vein of left lower extremity Dementia in other diseases classified elsewhere, unspecified severity, without behavioral disturbance, psychotic disturbance, mood F02.80 disturbance, and anxiety Facility  Procedures : CPT4 Code: [id number] Description: 09381 - DEBRIDE WOUND 1ST 20 SQ CM OR < ICD-10 Diagnosis Description L97.822 Non-pressure chronic ulcer of other part of left lower leg with fat layer expose Modifier: d Quantity: 1 Physician Procedures : CPT4: Description Modifier Code [id number] - WC PHYS LEVEL 3 - EST PT 25 ICD-10 Diagnosis Description L97.822 Non-pressure chronic ulcer of other part of left lower leg with fat layer exposed F02.80 Dementia in other diseases classified elsewhere,  unspecified severity, without behavioral disturbance, p disturbance, mood disturbance, and anxiety I82.492 Acute embolism and thrombosis of other specified deep vein of left lower extremity Quantity: 1 sychotic : CPT4: [id number] - WC PHYS DEBR WO ANESTH 20 SQ CM ICD-10 Diagnosis Description L97.822 Non-pressure chronic ulcer of other part of left lower leg with fat layer exposed Quantity: 1 Electronic Signature(s) Signed: 03/07/2022 12:30:32 PM By: Fredirick Maudlin MD FACS Entered By: Fredirick Maudlin on 03/07/2022 12:30:32

## 2022-03-13 NOTE — Progress Notes (Signed)
KAISLEE, CHAO ([id number]) [id number] Page 1 of 11 Visit Report for 03/07/2022 Arrival Information Details Patient Name: Date of Service: [name] 03/07/2022 10:45 A M Medical Record Number: [id number] Patient Account Number: [id number] Date of Birth/Sex: Treating RN: 10/13/1931 (88 y.o. [name] Primary Care [name]: [name] Other Clinician: Referring [name]: Treating [name] in Treatment: Mitchell Visit Information History Since Last Visit Added or deleted any medications: No Patient Arrived: [name] Any new allergies or adverse reactions: No Arrival Time: 11:03 Had a fall or experienced change in No Accompanied By: husband activities of daily living that may affect Transfer Assistance: None risk of falls: Patient Requires Transmission-Based Precautions: No Signs or symptoms of abuse/neglect since last visito No Patient Has Alerts: No Hospitalized since last visit: No Implantable device outside of the clinic excluding No cellular tissue based products placed in the center since last visit: Has Compression in Place as Prescribed: Yes Pain Present Now: No Electronic Signature(s) Signed: 03/13/2022 4:55:50 PM By: Blanche East RN Entered By: Blanche East on 03/07/2022 11:03:43 -------------------------------------------------------------------------------- Encounter Discharge Information Details Patient Name: Date of Service: [name], [name]. 03/07/2022 10:45 A M Medical Record Number: [id number] Patient Account Number: [id number] Date of Birth/Sex: Treating RN: [birth year] (88 y.o. [name] Primary Care Shiara Mcgough: [name] Other Clinician: Referring [name]: Treating [name]/Extender: [name] Encounter Discharge Information Items Post Procedure Vitals Discharge Condition: Stable Temperature (F): 97.7 Ambulatory  Status: Walker Pulse (bpm): 93 Discharge Destination: Home Respiratory Rate (breaths/min): 16 Transportation: Private Auto Blood Pressure (mmHg): 91/56 Accompanied By: self Schedule Follow-up Appointment: Yes Clinical Summary of Care: Electronic Signature(s) Signed: 03/13/2022 4:55:50 PM By: Blanche East RN Entered By: Blanche East on 03/07/2022 11:59:43 [name], [name] ([id number]) [id number] Page 2 of 11 -------------------------------------------------------------------------------- Lower Extremity Assessment Details Patient Name: Date of Service: [name] 03/07/2022 10:45 A M Medical Record Number: [id number] Patient Account Number: [id number] Date of Birth/Sex: Treating RN: 1931/10/22 (88 y.o. [name] Primary Care Jaxie Racanelli: [name] Other Clinician: Referring Elody Kleinsasser: Treating [name]/Extender: [name] Edema Assessment Assessed: [name]: No] [Right: No] [Left: Edema] [Right: :] Calf Left: Right: Point of Measurement: From Medial Instep 29.5 cm Ankle Left: Right: Point of Measurement: From Medial Instep 18.5 cm Vascular Assessment Pulses: Dorsalis Pedis Palpable: [Left:Yes] Electronic Signature(s) Signed: 03/13/2022 4:55:50 PM By: Blanche East RN Entered By: Blanche East on 03/07/2022 11:09:44 -------------------------------------------------------------------------------- Multi Wound Chart Details Patient Name: Date of Service: [name], [name]. 03/07/2022 10:45 A M Medical Record Number: [id number] Patient Account Number: [id number] Date of Birth/Sex: Treating RN: 10-24-1931 (88 y.o. F) Primary Care [name]: [name] Other Clinician: Referring [name]: Treating [name]/Extender: [name] Vital Signs Height(in): 66 Pulse(bpm): 93 Weight(lbs): 133 Blood Pressure(mmHg): 91/56 Body Mass Index(BMI):  21.5 Temperature(F): 97.7 Respiratory Rate(breaths/min): 16 [1:Photos:] [[id number] Page 3 of 11] Left, Posterior Lower Leg Left, Posterior, Superior Lower Leg Left, Medial Lower Leg Wound Location: Skin T ear/Laceration Skin Tear/Laceration Skin Tear/Laceration Wounding Event: Venous Leg Ulcer Venous Leg Ulcer Venous Leg Ulcer Primary Etiology: Hypertension, Dementia Hypertension, Dementia Hypertension, Dementia Comorbid History: 02/08/2022 02/04/2022 02/04/2022 Date Acquired: Mitchell Mitchell Mitchell Weeks of Treatment: Open Open Open Wound Status: No No No Wound Recurrence: 4.2x2x0.1 6.8x1.5x0.1 Mitchell.8x1x0.1 Measurements L x W x D (cm) 6.597 8.011 Mitchell.628 A (cm) : rea Mitchell.66 Mitchell.801 Mitchell.063 Volume (cm) : 27.10% 11.50% -  14.20% % Reduction in A rea: 27.10% 11.50% -14.50% % Reduction in Volume: Full Thickness Without Exposed Full Thickness Without Exposed Full Thickness Without Exposed Classification: Support Structures Support Structures Support Structures Medium Medium Medium Exudate A mount: Serous Serous Serous Exudate Type: Geophysical data processor Exudate Color: N/A N/A Flat and Intact Wound Margin: Medium (34-66%) Medium (34-66%) Medium (34-66%) Granulation A mount: Red, Pink Pink Red, Pink Granulation Quality: Medium (34-66%) Medium (34-66%) Medium (34-66%) Necrotic A mount: N/A Eschar Eschar Necrotic Tissue: Fat Layer (Subcutaneous Tissue): Yes N/A N/A Exposed Structures: Fascia: No Tendon: No Muscle: No Joint: No Bone: No N/A Small (1-33%) None Epithelialization: Debridement - Selective/Open Wound Debridement - Selective/Open Wound Debridement - Selective/Open Wound Debridement: Pre-procedure Verification/Time Out 11:26 11:26 11:26 Taken: Lidocaine 5% topical ointment Lidocaine 5% topical ointment Lidocaine 5% topical ointment Pain Control: Necrotic/Eschar, Psychologist, prison and probation services, Psychologist, prison and probation services, [employer] Tissue Debrided: Non-Viable  Tissue Non-Viable Tissue Non-Viable Tissue Level: 8.4 10.2 Mitchell.8 Debridement A (sq cm): rea Curette Curette Curette Instrument: Minimum Minimum Minimum Bleeding: Pressure Pressure Pressure Hemostasis A chieved: Procedure was tolerated well Procedure was tolerated well Procedure was tolerated well Debridement Treatment Response: 4.2x2x0.1 6.8x1.5x0.1 Mitchell.8x1x0.1 Post Debridement Measurements L x W x D (cm) Mitchell.66 Mitchell.801 Mitchell.063 Post Debridement Volume: (cm) No Abnormalities Noted No Abnormalities Noted Periwound Skin Texture: Dry/Scaly: Yes Maceration: No Dry/Scaly: Yes Periwound Skin Moisture: Maceration: No Dry/Scaly: No No Abnormalities Noted No Abnormalities Noted Periwound Skin Color: No Abnormality No Abnormality N/A Temperature: N/A N/A 2 wound cluster Assessment Notes: Debridement Debridement Debridement Procedures Performed: Treatment Notes Wound #1 (Lower Leg) Wound Laterality: Left, Posterior Cleanser Soap and Water Discharge Instruction: May shower and wash wound with dial antibacterial soap and water prior to dressing change. Peri-Wound Care Sween Lotion (Moisturizing lotion) Discharge Instruction: Apply moisturizing lotion as directed Topical Primary Dressing IODOFLEX Mitchell.9% Cadexomer Iodine Pad 4x6 cm Discharge Instruction: Apply to wound bed as instructed Secondary Dressing Zetuvit Plus 4x8 in Discharge Instruction: Apply over primary dressing as directed. Secured With Compression Wrap Kerlix Roll 4.5x3.1 (in/yd) Discharge Instruction: Apply Kerlix and Coban compression as directed. Coban Self-Adherent Wrap 4x5 (in/yd) Discharge Instruction: Apply over Kerlix as directed. AZIE, MCCONAHY ([id number]) [id number] Page 4 of 11 Compression Stockings Add-Ons Wound #2 (Lower Leg) Wound Laterality: Left, Posterior, Superior Cleanser Soap and Water Discharge Instruction: May shower and wash wound with dial antibacterial soap and  water prior to dressing change. Peri-Wound Care Sween Lotion (Moisturizing lotion) Discharge Instruction: Apply moisturizing lotion as directed Topical Primary Dressing IODOFLEX Mitchell.9% Cadexomer Iodine Pad 4x6 cm Discharge Instruction: Apply to wound bed as instructed Secondary Dressing Zetuvit Plus 4x8 in Discharge Instruction: Apply over primary dressing as directed. Secured With Compression Wrap Kerlix Roll 4.5x3.1 (in/yd) Discharge Instruction: Apply Kerlix and Coban compression as directed. Coban Self-Adherent Wrap 4x5 (in/yd) Discharge Instruction: Apply over Kerlix as directed. Compression Stockings Add-Ons Wound #3 (Lower Leg) Wound Laterality: Left, Medial Cleanser Soap and Water Discharge Instruction: May shower and wash wound with dial antibacterial soap and water prior to dressing change. Peri-Wound Care Sween Lotion (Moisturizing lotion) Discharge Instruction: Apply moisturizing lotion as directed Topical Primary Dressing IODOFLEX Mitchell.9% Cadexomer Iodine Pad 4x6 cm Discharge Instruction: Apply to wound bed as instructed Secondary Dressing Zetuvit Plus 4x8 in Discharge Instruction: Apply over primary dressing as directed. Secured With Compression Wrap Kerlix Roll 4.5x3.1 (in/yd) Discharge Instruction: Apply Kerlix and Coban compression as directed. Coban Self-Adherent Wrap 4x5 (in/yd) Discharge Instruction: Apply over Kerlix as directed. Compression Stockings Add-Ons Electronic Signature(s) Signed: 03/07/2022 12:25:58  PM By: Fredirick Maudlin MD FACS Entered By: Fredirick Maudlin on 03/07/2022 12:25:58 Hollopeter, [name] ([id number]) [id number] Page 5 of 11 -------------------------------------------------------------------------------- Multi-Disciplinary Care Plan Details Patient Name: Date of Service: [name] 03/07/2022 10:45 A M Medical Record Number: [id number] Patient Account Number: [id number] Date of Birth/Sex:  Treating RN: 28-Apr-1931 (88 y.o. [name] Primary Care Valdemar Mcclenahan: [name] Other Clinician: Referring Diallo Ponder: Treating Rayansh Herbst/Extender: [name] Active Inactive Orientation to the Wound Care Program Nursing Diagnoses: Knowledge deficit related to the wound healing center program Goals: Patient/caregiver will verbalize understanding of the [facility] Date Initiated: 03/01/2022 Target Resolution Date: 03/22/2022 Goal Status: Active Interventions: Provide education on orientation to the wound center Notes: Venous Leg Ulcer Nursing Diagnoses: Potential for venous Insuffiency (use before diagnosis confirmed) Goals: Patient will maintain optimal edema control Date Initiated: 03/01/2022 Target Resolution Date: 03/29/2022 Goal Status: Active Interventions: Provide education on venous insufficiency Treatment Activities: Therapeutic compression applied : 03/01/2022 Notes: Wound/Skin Impairment Nursing Diagnoses: Knowledge deficit related to ulceration/compromised skin integrity Goals: Ulcer/skin breakdown will have a volume reduction of 30% by week 4 Date Initiated: 03/01/2022 Target Resolution Date: 03/29/2022 Goal Status: Active Interventions: Assess ulceration(s) every visit Treatment Activities: Topical wound management initiated : 03/01/2022 Notes: Electronic Signature(s) Signed: 03/13/2022 4:55:50 PM By: Blanche East RN Entered By: Blanche East on 03/07/2022 11:20:55 Olsson, [name] ([id number]) [id number] Page 6 of 11 -------------------------------------------------------------------------------- Pain Assessment Details Patient Name: Date of Service: [name] 03/07/2022 10:45 A M Medical Record Number: [id number] Patient Account Number: [id number] Date of Birth/Sex: Treating RN: 1931-10-05 (88 y.o. [name] Primary Care Shloima Clinch: [name]  Other Clinician: Referring Ane Conerly: Treating Moishe Schellenberg/Extender: [name] Active Problems Location of Pain Severity and Description of Pain Patient Has Paino No Site Locations Rate the pain. Current Pain Level: Mitchell Pain Management and Medication Current Pain Management: Electronic Signature(s) Signed: 03/13/2022 4:55:50 PM By: Blanche East RN Entered By: Blanche East on 03/07/2022 11:09:27 -------------------------------------------------------------------------------- Patient/Caregiver Education Details Patient Name: Date of Service: [name], [name] 11/21/2023andnbsp10:45 A M Medical Record Number: [id number] Patient Account Number: [id number] Date of Birth/Gender: Treating RN: 04/13/1932 (88 y.o. [name] Primary Care Physician: [name] Other Clinician: Referring Physician: Treating Physician/Extender: [name] Education Assessment Education Provided To: Patient Education Topics Provided Venous: Methods: Explain/Verbal Responses: Reinforcements needed, State content correctly Cynthia, Mitchell ([id number]) [phone].pdf Page 7 of 11 Wound/Skin Impairment: Methods: Explain/Verbal Responses: Reinforcements needed, State content correctly Electronic Signature(s) Signed: 03/13/2022 4:55:50 PM By: Blanche East RN Entered By: Blanche East on 03/07/2022 11:22:46 -------------------------------------------------------------------------------- Wound Assessment Details Patient Name: Date of Service: Cynthia RDSCathlean Mitchell 03/07/2022 10:45 A M Medical Record Number: [id number] Patient Account Number: [id number] Date of Birth/Sex: Treating RN: [birth year] (88 y.o. [name] Primary Care Mikaeel Petrow: [name] Other Clinician: Referring Shizuko Wojdyla: Treating Zaccary Creech/Extender: [name] Wound Status Wound  Number: 1 Primary Etiology: Venous Leg Ulcer Wound Location: Left, Posterior Lower Leg Wound Status: Open Wounding Event: Skin Tear/Laceration Notes: cluster of 3 wounds Date Acquired: 02/08/2022 Comorbid History: Hypertension, Dementia Weeks Of Treatment: Mitchell Clustered Wound: No Photos Wound Measurements Length: (cm) 4.2 Width: (cm) 2 Depth: (cm) Mitchell.1 Area: (cm) 6.597 Volume: (cm) Mitchell.66 % Reduction in Area: 27.1% % Reduction in Volume: 27.1% Tunneling: No Undermining: No Wound Description Classification: Full Thickness Without Exposed Support  Exudate Amount: Medium Exudate Type: Serous Exudate Color: amber Structures Foul Odor After Cleansing: No Slough/Fibrino Yes Wound Bed Granulation Amount: Medium (34-66%) Exposed Structure Granulation Quality: Red, Pink Fascia Exposed: No Necrotic Amount: Medium (34-66%) Fat Layer (Subcutaneous Tissue) Exposed: Yes Tendon Exposed: No Muscle Exposed: No Joint Exposed: No Bone Exposed: No Periwound Skin Texture Texture Color No Abnormalities Noted: Yes No Abnormalities NotedLILYTH, LAWYER Mitchell ([id number]) [phone].pdf Page 8 of 11 Moisture Temperature / Pain No Abnormalities Noted: No Temperature: No Abnormality Dry / Scaly: Yes Maceration: No Treatment Notes Wound #1 (Lower Leg) Wound Laterality: Left, Posterior Cleanser Soap and Water Discharge Instruction: May shower and wash wound with dial antibacterial soap and water prior to dressing change. Peri-Wound Care Sween Lotion (Moisturizing lotion) Discharge Instruction: Apply moisturizing lotion as directed Topical Primary Dressing IODOFLEX Mitchell.9% Cadexomer Iodine Pad 4x6 cm Discharge Instruction: Apply to wound bed as instructed Secondary Dressing Zetuvit Plus 4x8 in Discharge Instruction: Apply over primary dressing as directed. Secured With Compression Wrap Kerlix Roll 4.5x3.1 (in/yd) Discharge Instruction: Apply Kerlix and Coban  compression as directed. Coban Self-Adherent Wrap 4x5 (in/yd) Discharge Instruction: Apply over Kerlix as directed. Compression Stockings Add-Ons Electronic Signature(s) Signed: 03/13/2022 4:55:50 PM By: Blanche East RN Entered By: Blanche East on 03/07/2022 11:20:14 -------------------------------------------------------------------------------- Wound Assessment Details Patient Name: Date of Service: [name], [name] 03/07/2022 10:45 A M Medical Record Number: [id number] Patient Account Number: [id number] Date of Birth/Sex: Treating RN: 03-Oct-1931 (88 y.o. [name] Primary Care Larue Drawdy: [name] Other Clinician: Referring Dorthy Hustead: Treating Labrenda Lasky/Extender: [name] Wound Status Wound Number: 2 Primary Etiology: Venous Leg Ulcer Wound Location: Left, Posterior, Superior Lower Leg Wound Status: Open Wounding Event: Skin Tear/Laceration Comorbid History: Hypertension, Dementia Date Acquired: 02/04/2022 Weeks Of Treatment: Mitchell Clustered Wound: No Photos Cynthia, Mitchell ([id number]) [id number] Page 9 of 11 Wound Measurements Length: (cm) 6.8 Width: (cm) 1.5 Depth: (cm) Mitchell.1 Area: (cm) 8.011 Volume: (cm) Mitchell.801 % Reduction in Area: 11.5% % Reduction in Volume: 11.5% Epithelialization: Small (1-33%) Wound Description Classification: Full Thickness Without Exposed Support Structures Exudate Amount: Medium Exudate Type: Serous Exudate Color: amber Foul Odor After Cleansing: No Slough/Fibrino Yes Wound Bed Granulation Amount: Medium (34-66%) Granulation Quality: Pink Necrotic Amount: Medium (34-66%) Necrotic Quality: Eschar Periwound Skin Texture Texture Color No Abnormalities Noted: Yes No Abnormalities Noted: Yes Moisture Temperature / Pain No Abnormalities Noted: No Temperature: No Abnormality Dry / Scaly: No Maceration: No Treatment Notes Wound #2 (Lower Leg) Wound  Laterality: Left, Posterior, Superior Cleanser Soap and Water Discharge Instruction: May shower and wash wound with dial antibacterial soap and water prior to dressing change. Peri-Wound Care Sween Lotion (Moisturizing lotion) Discharge Instruction: Apply moisturizing lotion as directed Topical Primary Dressing IODOFLEX Mitchell.9% Cadexomer Iodine Pad 4x6 cm Discharge Instruction: Apply to wound bed as instructed Secondary Dressing Zetuvit Plus 4x8 in Discharge Instruction: Apply over primary dressing as directed. Secured With Compression Wrap Kerlix Roll 4.5x3.1 (in/yd) Discharge Instruction: Apply Kerlix and Coban compression as directed. Coban Self-Adherent Wrap 4x5 (in/yd) Discharge Instruction: Apply over Kerlix as directed. Compression Stockings Add-Ons Cynthia, Mitchell ([id number]) [phone].pdf Page 10 of 11 Electronic Signature(s) Signed: 03/13/2022 4:55:50 PM By: Blanche East RN Entered By: Blanche East on 03/07/2022 11:19:21 -------------------------------------------------------------------------------- Wound Assessment Details Patient Name: Date of Service: [name] 03/07/2022 10:45 A M Medical Record Number: [id number] Patient Account Number: [id number] Date of Birth/Sex: Treating RN: [birth year] (88 y.o. [name] Primary Care Kervin Bones:  [name] Other Clinician: Referring [name]: Treating Livia Tarr/Extender: [name] Wound Status Wound Number: 3 Primary Etiology: Venous Leg Ulcer Wound Location: Left, Medial Lower Leg Wound Status: Open Wounding Event: Skin Tear/Laceration Comorbid History: Hypertension, Dementia Date Acquired: 02/04/2022 Weeks Of Treatment: Mitchell Clustered Wound: No Photos Wound Measurements Length: (cm) Mitchell.8 Width: (cm) 1 Depth: (cm) Mitchell.1 Area: (cm) Mitchell.628 Volume: (cm) Mitchell.063 % Reduction in Area: -14.2% % Reduction in Volume:  -14.5% Epithelialization: None Tunneling: No Undermining: No Wound Description Classification: Full Thickness Without Exposed Support Wound Margin: Flat and Intact Exudate Amount: Medium Exudate Type: Serous Exudate Color: amber Structures Foul Odor After Cleansing: No Slough/Fibrino Yes Wound Bed Granulation Amount: Medium (34-66%) Granulation Quality: Red, Pink Necrotic Amount: Medium (34-66%) Necrotic Quality: Eschar Periwound Skin Texture Texture Color No Abnormalities Noted: No No Abnormalities Noted: No Moisture No Abnormalities Noted: No Dry / Scaly: Yes Assessment Notes 2 wound cluster Cynthia, Mitchell ([id number]) [id number] Page 11 of 11 Treatment Notes Wound #3 (Lower Leg) Wound Laterality: Left, Medial Cleanser Soap and Water Discharge Instruction: May shower and wash wound with dial antibacterial soap and water prior to dressing change. Peri-Wound Care Sween Lotion (Moisturizing lotion) Discharge Instruction: Apply moisturizing lotion as directed Topical Primary Dressing IODOFLEX Mitchell.9% Cadexomer Iodine Pad 4x6 cm Discharge Instruction: Apply to wound bed as instructed Secondary Dressing Zetuvit Plus 4x8 in Discharge Instruction: Apply over primary dressing as directed. Secured With Compression Wrap Kerlix Roll 4.5x3.1 (in/yd) Discharge Instruction: Apply Kerlix and Coban compression as directed. Coban Self-Adherent Wrap 4x5 (in/yd) Discharge Instruction: Apply over Kerlix as directed. Compression Stockings Add-Ons Electronic Signature(s) Signed: 03/13/2022 4:55:50 PM By: Blanche East RN Entered By: Blanche East on 03/07/2022 11:18:32 -------------------------------------------------------------------------------- Vitals Details Patient Name: Date of Service: [name], [name]. 03/07/2022 10:45 A M Medical Record Number: [id number] Patient Account Number: [id number] Date of Birth/Sex: Treating RN: 1932-03-27 (88 y.o. [name] Primary Care Aadam Zhen: [name] Other Clinician: Referring [name]: Treating Tou Hayner/Extender: [name] Vital Signs Time Taken: 11:08 Temperature (F): 97.7 Height (in): 66 Pulse (bpm): 93 Weight (lbs): 133 Respiratory Rate (breaths/min): 16 Body Mass Index (BMI): 21.5 Blood Pressure (mmHg): 91/56 Reference Range: 80 - 120 mg / dl Electronic Signature(s) Signed: 03/13/2022 4:55:50 PM By: Blanche East RN Entered By: Blanche East on 03/07/2022 11:09:15

## 2022-03-14 ENCOUNTER — Ambulatory Visit (HOSPITAL_BASED_OUTPATIENT_CLINIC_OR_DEPARTMENT_OTHER): Payer: Medicare PPO | Admitting: General Surgery

## 2022-03-14 NOTE — Progress Notes (Signed)
Attempted to call preliminary results to [phone] 03/13/22 and 03/14/22, with no answer. Results routed to Dr. Celine Ahr. Refer to "CV Proc" under chart review to view preliminary results.  03/14/2022 8:24 AM Kelby Aline., MHA, RVT, RDCS, RDMS

## 2022-03-26 DIAGNOSIS — M545 Low back pain, unspecified: Secondary | ICD-10-CM | POA: Diagnosis not present

## 2022-03-29 ENCOUNTER — Other Ambulatory Visit: Payer: Self-pay

## 2022-03-29 ENCOUNTER — Emergency Department (HOSPITAL_COMMUNITY)
Admission: EM | Admit: 2022-03-29 | Discharge: 2022-03-29 | Disposition: A | Discharge status: Home / Self Care | Payer: Medicare PPO | Attending: Emergency Medicine | Admitting: Emergency Medicine

## 2022-03-29 ENCOUNTER — Emergency Department (HOSPITAL_COMMUNITY): Payer: Medicare PPO

## 2022-03-29 ENCOUNTER — Encounter (HOSPITAL_COMMUNITY): Payer: Self-pay

## 2022-03-29 DIAGNOSIS — Z743 Need for continuous supervision: Secondary | ICD-10-CM | POA: Diagnosis not present

## 2022-03-29 DIAGNOSIS — R404 Transient alteration of awareness: Secondary | ICD-10-CM | POA: Diagnosis not present

## 2022-03-29 DIAGNOSIS — M549 Dorsalgia, unspecified: Secondary | ICD-10-CM | POA: Diagnosis not present

## 2022-03-29 DIAGNOSIS — M545 Low back pain, unspecified: Secondary | ICD-10-CM | POA: Diagnosis not present

## 2022-03-29 DIAGNOSIS — R531 Weakness: Secondary | ICD-10-CM | POA: Insufficient documentation

## 2022-03-29 DIAGNOSIS — R400 Somnolence: Secondary | ICD-10-CM | POA: Diagnosis not present

## 2022-03-29 DIAGNOSIS — J9811 Atelectasis: Secondary | ICD-10-CM | POA: Diagnosis not present

## 2022-03-29 DIAGNOSIS — R4182 Altered mental status, unspecified: Secondary | ICD-10-CM | POA: Diagnosis not present

## 2022-03-29 DIAGNOSIS — Z7401 Bed confinement status: Secondary | ICD-10-CM | POA: Diagnosis not present

## 2022-03-29 DIAGNOSIS — I499 Cardiac arrhythmia, unspecified: Secondary | ICD-10-CM | POA: Diagnosis not present

## 2022-03-29 LAB — COMPREHENSIVE METABOLIC PANEL
ALT: 13 U/L (ref 0–44)
AST: 19 U/L (ref 15–41)
Albumin: 3.7 g/dL (ref 3.5–5.0)
Alkaline Phosphatase: 44 U/L (ref 38–126)
Anion gap: 8 (ref 5–15)
BUN: 50 mg/dL — ABNORMAL HIGH (ref 8–23)
CO2: 24 mmol/L (ref 22–32)
Calcium: 9.6 mg/dL (ref 8.9–10.3)
Chloride: 107 mmol/L (ref 98–111)
Creatinine, Ser: 2.17 mg/dL — ABNORMAL HIGH (ref 0.44–1.00)
GFR, Estimated: 21 mL/min — ABNORMAL LOW (ref >60)
Glucose, Bld: 127 mg/dL — ABNORMAL HIGH (ref 70–99)
Potassium: 4.5 mmol/L (ref 3.5–5.1)
Sodium: 139 mmol/L (ref 135–145)
Total Bilirubin: 0.6 mg/dL (ref 0.3–1.2)
Total Protein: 6.7 g/dL (ref 6.5–8.1)

## 2022-03-29 LAB — TSH: TSH: 0.205 u[IU]/mL — ABNORMAL LOW (ref 0.350–4.500)

## 2022-03-29 LAB — CBC WITH DIFFERENTIAL/PLATELET
Abs Immature Granulocytes: 0.09 10*3/uL — ABNORMAL HIGH (ref 0.00–0.07)
Basophils Absolute: 0 10*3/uL (ref 0.0–0.1)
Basophils Relative: 0 %
Eosinophils Absolute: 0 10*3/uL (ref 0.0–0.5)
Eosinophils Relative: 0 %
HCT: 42.5 % (ref 36.0–46.0)
Hemoglobin: 13.9 g/dL (ref 12.0–15.0)
Immature Granulocytes: 1 %
Lymphocytes Relative: 15 %
Lymphs Abs: 1.4 10*3/uL (ref 0.7–4.0)
MCH: 31.6 pg (ref 26.0–34.0)
MCHC: 32.7 g/dL (ref 30.0–36.0)
MCV: 96.6 fL (ref 80.0–100.0)
Monocytes Absolute: 0.2 10*3/uL (ref 0.1–1.0)
Monocytes Relative: 3 %
Neutro Abs: 7.5 10*3/uL (ref 1.7–7.7)
Neutrophils Relative %: 81 %
Platelets: 195 10*3/uL (ref 150–400)
RBC: 4.4 MIL/uL (ref 3.87–5.11)
RDW: 13.9 % (ref 11.5–15.5)
WBC: 9.2 10*3/uL (ref 4.0–10.5)
nRBC: 0 % (ref 0.0–0.2)

## 2022-03-29 LAB — URINALYSIS, ROUTINE W REFLEX MICROSCOPIC
Bilirubin Urine: NEGATIVE
Glucose, UA: NEGATIVE mg/dL
Hgb urine dipstick: NEGATIVE
Ketones, ur: NEGATIVE mg/dL
Leukocytes,Ua: NEGATIVE
Nitrite: NEGATIVE
Protein, ur: NEGATIVE mg/dL
Specific Gravity, Urine: 1.016 (ref 1.005–1.030)
pH: 5 (ref 5.0–8.0)

## 2022-03-29 LAB — T4, FREE: Free T4: 1.25 ng/dL — ABNORMAL HIGH (ref 0.61–1.12)

## 2022-03-29 MED ORDER — SODIUM CHLORIDE 0.9 % IV BOLUS
1000.0000 mL | Freq: Once | INTRAVENOUS | Status: AC
  Administered 2022-03-29: 1000 mL via INTRAVENOUS

## 2022-03-29 NOTE — Discharge Instructions (Signed)
[name]'s workup did not show any significant emergencies.  She had some very mild dehydration on her blood test and was given IV fluids in the ED.  I did discuss her workup and her history with her husband by phone.  I agree that this may be a progression of her dementia.  This can be waxing or waning.  Unfortunately, when patients of advanced dementia, she was to stop eating or drinking, it may be a sign that they are also approaching end-of-life.  I recommend that the facility provider or doctor consider goals of care discussion with the family if these symptoms persist.

## 2022-03-29 NOTE — ED Provider Notes (Signed)
[name] Provider Note   CSN: [id number] Arrival date & time: 03/29/22  1541     History  Chief Complaint  Patient presents with   Weakness    [name] is a 88 y.o. female with a history of recently diagnosed DVT, on Eliquis, hypothyroidism, dementia, breast cancer, presenting to the ED from her living facility (memory unit) with concern for failure to thrive, decline in cognition.  The patient was diagnosed with COVID 2 weeks ago per EMS and husband's report.  She has been eating and drinking very little since then.  Increasingly weak.  Her husband [name] by phone reports patient has stopped eating, has no appetite, for about 3-4 days.  This has happened in the past as well, a few months ago.  He also reports her language has been "deteriorating" where she has been increasingly difficult to understand/unintelligible the past few months.  She arrives with signed DNR/DNI forms.  HPI     Home Medications Prior to Admission medications   Medication Sig Start Date End Date Taking? Authorizing Provider  acetaminophen (TYLENOL) 325 MG tablet Take 325 mg by mouth as needed (pain).     [provider]  acidophilus (RISAQUAD) CAPS capsule Take 1 capsule by mouth daily.    [provider]  apixaban (ELIQUIS) 5 MG TABS tablet Take 5 mg by mouth 2 (two) times daily.    [provider]  Calcium Carbonate (CALCIUM 600 PO) Take 1 tablet by mouth every evening.    [provider]  cetirizine (ZYRTEC) 10 MG tablet Take 10 mg by mouth at bedtime.    [provider]  Cholecalciferol (VITAMIN D3) 1.25 MG (50000 UT) CAPS Take 1 capsule by mouth once a week.    [provider]  levothyroxine (SYNTHROID, LEVOTHROID) 50 MCG tablet Take 50 mcg by mouth daily.    [provider]  memantine (NAMENDA) 10 MG tablet Take 1 tablet (10 mg total) by mouth 2 (two) times daily. Please request  future refills from PCP. 07/14/19   Marcial Pacas, MD  phenylephrine-shark liver oil-mineral oil-petrolatum (PREPARATION H) 0.25-14-74.9 % rectal ointment Place 1 application. rectally in the morning, at noon, in the evening, and at bedtime.    [provider]  predniSONE (DELTASONE) 5 MG tablet Take 10 mg by mouth in the morning.    [provider]  tacrolimus (PROGRAF) 1 MG capsule Take 0.5 mg by mouth See admin instructions. Take 0.'5mg'$  bid with 1 mg for a total of 1.'5mg'$  ,'1mg'$  twice daily    [provider]  traMADol (ULTRAM) 50 MG tablet Take 1 tablet by mouth 3 times a day as needed 03/25/21         Allergies    Cephalexin, Augmentin [amoxicillin-pot clavulanate], Doxycycline, Minocycline, and Promethazine    Review of Systems   Review of Systems  Physical Exam Updated Vital Signs BP (!) 150/94 (BP Location: Left Arm)   Pulse 70   Temp 98.2 F (36.8 C) (Oral)   Resp 18   Ht '5\' 2"'$  (1.575 m)   Wt 61.2 kg   SpO2 100%   BMI 24.69 kg/m  Physical Exam Constitutional:      General: She is not in acute distress. HENT:     Head: Normocephalic and atraumatic.  Eyes:     Conjunctiva/sclera: Conjunctivae normal.     Pupils: Pupils are equal, round, and reactive to light.  Cardiovascular:     Rate and Rhythm:  Normal rate and regular rhythm.  Pulmonary:     Effort: Pulmonary effort is normal. No respiratory distress.  Abdominal:     General: There is no distension.     Tenderness: There is no abdominal tenderness.  Skin:    General: Skin is warm and dry.  Neurological:     General: No focal deficit present.     Mental Status: She is alert. Mental status is at baseline.     ED Results / Procedures / Treatments   Labs (all labs ordered are listed, but only abnormal results are displayed) Labs Reviewed  COMPREHENSIVE METABOLIC PANEL - Abnormal; Notable for the following components:      Result Value   Glucose, Bld 127 (*)    BUN 50 (*)    Creatinine,  Ser 2.17 (*)    GFR, Estimated 21 (*)    All other components within normal limits  CBC WITH DIFFERENTIAL/PLATELET - Abnormal; Notable for the following components:   Abs Immature Granulocytes 0.09 (*)    All other components within normal limits  TSH - Abnormal; Notable for the following components:   TSH 0.205 (*)    All other components within normal limits  URINALYSIS, ROUTINE W REFLEX MICROSCOPIC  T4, FREE    EKG None  Radiology DG Chest Portable 1 View  Result Date: 03/29/2022 CLINICAL DATA:  Altered mental status. EXAM: PORTABLE CHEST 1 VIEW COMPARISON:  February 12, 2020. FINDINGS: Stable cardiomediastinal silhouette. Minimal left basilar subsegmental atelectasis is noted. The visualized skeletal structures are unremarkable. IMPRESSION: Minimal left basilar subsegmental atelectasis. Electronically Signed   By: Marijo Conception M.D.   On: 03/29/2022 17:39   CT Head Wo Contrast  Result Date: 03/29/2022 CLINICAL DATA:  COVID diagnosed 03/14/2022, altered level of consciousness, fell out of wheelchair last week EXAM: CT HEAD WITHOUT CONTRAST TECHNIQUE: Contiguous axial images were obtained from the base of the skull through the vertex without intravenous contrast. RADIATION DOSE REDUCTION: This exam was performed according to the departmental dose-optimization program which includes automated exposure control, adjustment of the mA and/or kV according to patient size and/or use of iterative reconstruction technique. COMPARISON:  08/16/2021 FINDINGS: Brain: Stable chronic small-vessel ischemic changes are seen throughout the periventricular white matter and basal ganglia. No evidence of acute infarct or hemorrhage. The lateral ventricles and midline structures are unremarkable. No acute extra-axial fluid collections. No mass effect. Vascular: No hyperdense vessel or unexpected calcification. Skull: Normal. Negative for fracture or focal lesion. Sinuses/Orbits: No acute finding. Other: None.  IMPRESSION: 1. Stable head CT, no acute intracranial process. Electronically Signed   By: Randa Ngo M.D.   On: 03/29/2022 17:05    Procedures Procedures    Medications Ordered in ED Medications  sodium chloride 0.9 % bolus 1,000 mL (0 mLs Intravenous Stopped 03/29/22 1949)    ED Course/ Medical Decision Making/ A&P                           Medical Decision Making Amount and/or Complexity of Data Reviewed Labs: ordered. Radiology: ordered.   This patient presents to the ED with concern for weakness. This involves an extensive number of treatment options, and is a complaint that carries with it a high risk of complications and morbidity.  The differential diagnosis includes chronic dementia vs UTI vs dehydration vs anemia vs other  Co-morbidities that complicate the patient evaluation: dementia  Additional history obtained from EMS, husband by phone  I  ordered and personally interpreted labs.  The pertinent results include: Some mild worsening of patient's creatinine and BUN which should be consistent with mild dehydration, does not an AKI.  No acute anemia.  No other significant electrolyte derangement.  TSH is very mildly low but the patient is on Synthroid appropriately.  I do not think this is hypothyroidism with this clinical picture  I ordered imaging studies including x-ray of the chest, CT of the head I independently visualized and interpreted imaging which showed no emergent findings I agree with the radiologist interpretation  The patient was maintained on a cardiac monitor.  I personally viewed and interpreted the cardiac monitored which showed an underlying rhythm of: Normal sinus rhythm  I ordered medication including IV fluids for hydration  Test Considered: Doubt meningitis, acute PE   After the interventions noted above, I reevaluated the patient and found that they have: stayed the same  Social Determinants of Health: I had a discussion with the patient's  husband generally regarding her goals of care.  He has have a strong opinion that this is just a progressive decline in the patient's dementia, which I am inclined to agree with.  I do not see an emergent medical cause to warrant hospitalization at this time.  We have given her some IV fluids to help with hydration we will discharge her back to her facility.  I did advise her husband that there is a chance that this may continue to progress, that this often happens at end-of-life with dementia patients.  Particularly given that she has reached a point where she has having difficulty forming intelligible words.  He verbalized understanding.  Dispostion:  After consideration of the diagnostic results and the patients response to treatment, I feel that the patent would benefit from outpatient PCP follow-up.        Final Clinical Impression(s) / ED Diagnoses Final diagnoses:  Weakness    Rx / DC Orders ED Discharge Orders     None         Wyvonnia Dusky, MD 03/29/22 2311

## 2022-03-29 NOTE — ED Notes (Signed)
[facility] called for transport.  

## 2022-03-29 NOTE — ED Notes (Signed)
[facility] arrival.

## 2022-03-29 NOTE — ED Notes (Signed)
Attempted to call [facility] to provide report, no answer.

## 2022-03-29 NOTE — ED Triage Notes (Signed)
Pt to ED via EMS from [facility] in memory care unit. Pt was dx with COVID on 11/28. States since then pt has been declining. States pt is now having to use a wheelchair, she is not eating. States pt slid out of her wheelchair on Thursday. Denied hitting head. She has a skin tear noted to L femur. Pt is a DNR. Pt's husband wanted pt sent to hospital due to being worried about her. Pt disoriented. Hx dementia.  BP 128/60 HR 80 O2 96% on RA CBG 112

## 2022-03-31 ENCOUNTER — Encounter (HOSPITAL_BASED_OUTPATIENT_CLINIC_OR_DEPARTMENT_OTHER): Payer: Medicare PPO | Attending: General Surgery | Admitting: Internal Medicine

## 2022-03-31 DIAGNOSIS — L97822 Non-pressure chronic ulcer of other part of left lower leg with fat layer exposed: Secondary | ICD-10-CM | POA: Diagnosis not present

## 2022-03-31 DIAGNOSIS — Z86718 Personal history of other venous thrombosis and embolism: Secondary | ICD-10-CM | POA: Insufficient documentation

## 2022-03-31 DIAGNOSIS — Z79621 Long term (current) use of calcineurin inhibitor: Secondary | ICD-10-CM | POA: Diagnosis not present

## 2022-03-31 DIAGNOSIS — Z7952 Long term (current) use of systemic steroids: Secondary | ICD-10-CM | POA: Diagnosis not present

## 2022-03-31 DIAGNOSIS — Z940 Kidney transplant status: Secondary | ICD-10-CM | POA: Diagnosis not present

## 2022-03-31 DIAGNOSIS — F0284 Dementia in other diseases classified elsewhere, unspecified severity, with anxiety: Secondary | ICD-10-CM | POA: Diagnosis not present

## 2022-03-31 DIAGNOSIS — Z7901 Long term (current) use of anticoagulants: Secondary | ICD-10-CM | POA: Insufficient documentation

## 2022-03-31 DIAGNOSIS — L97222 Non-pressure chronic ulcer of left calf with fat layer exposed: Secondary | ICD-10-CM | POA: Diagnosis not present

## 2022-03-31 NOTE — Progress Notes (Signed)
[name], [name] ([id number]) [id number] Page 1 of 13 Visit Report for 03/31/2022 Arrival Information Details Patient Name: Date of Service: [name] 03/31/2022 10:30 A M Medical Record Number: [id number] Patient Account Number: [id number] Date of Birth/Sex: Treating RN: 1931/08/03 (88 y.o. Marta Lamas Primary Care Keeven Matty: Deland Pretty Other Clinician: Referring Susen Haskew: Treating [place] Mullett/Extender: Ronni Rumble in Treatment: 4 Visit Information History Since Last Visit Added or deleted any medications: No Patient Arrived: Wheel Chair Any new allergies or adverse reactions: No Arrival Time: 10:36 Had a fall or experienced change in No Accompanied By: husband, caseworker activities of daily living that may affect Transfer Assistance: None risk of falls: Patient Identification Verified: Yes Signs or symptoms of abuse/neglect since last visito No Secondary Verification Process Completed: Yes Hospitalized since last visit: No Patient Requires Transmission-Based Precautions: No Implantable device outside of the clinic excluding No Patient Has Alerts: No cellular tissue based products placed in the center since last visit: Has Dressing in Place as Prescribed: Yes Pain Present Now: No Electronic Signature(s) Signed: 03/31/2022 5:17:14 PM By: Blanche East RN Entered By: Blanche East on 03/31/2022 10:37:32 -------------------------------------------------------------------------------- Clinic Level of Care Assessment Details Patient Name: Date of Service: [name] 03/31/2022 10:30 A M Medical Record Number: [id number] Patient Account Number: [id number] Date of Birth/Sex: Treating RN: 1932-04-04 (88 y.o. Marta Lamas Primary Care Enyah Moman: Deland Pretty Other Clinician: Referring Isobelle Tuckett: Treating Morenike Cuff/Extender: Ronni Rumble in Treatment: 4 Clinic Level of Care  Assessment Items TOOL 4 Quantity Score X- 1 0 Use when only an EandM is performed on FOLLOW-UP visit ASSESSMENTS - Nursing Assessment / Reassessment X- 1 10 Reassessment of Co-morbidities (includes updates in patient status) X- 1 5 Reassessment of Adherence to Treatment Plan ASSESSMENTS - Wound and Skin A ssessment / Reassessment X - Simple Wound Assessment / Reassessment - one wound 1 5 _0  - 0 Complex Wound Assessment / Reassessment - multiple wounds _1  - 0 Dermatologic / Skin Assessment (not related to wound area) ASSESSMENTS - Focused Assessment X- 1 5 Circumferential Edema Measurements - multi extremities _2  - 0 Nutritional Assessment / Counseling / Intervention [name], [name] ([id number]) [id number] Page 2 of 13 _3  - 0 Lower Extremity Assessment (monofilament, tuning fork, pulses) _4  - 0 Peripheral Arterial Disease Assessment (using hand held doppler) ASSESSMENTS - Ostomy and/or Continence Assessment and Care _5  - 0 Incontinence Assessment and Management _6  - 0 Ostomy Care Assessment and Management (repouching, etc.) PROCESS - Coordination of Care X - Simple Patient / Family Education for ongoing care 1 15 _7  - 0 Complex (extensive) Patient / Family Education for ongoing care X- 1 10 Staff obtains Programmer, systems, Records, T Results / Process Orders est X- 1 10 Staff telephones HHA, Nursing Homes / Clarify orders / etc _8  - 0 Routine Transfer to another Facility (non-emergent condition) _9  - 0 Routine Hospital Admission (non-emergent condition) _10  - 0 New Admissions / Biomedical engineer / Ordering NPWT Apligraf, etc. , _11  - 0 Emergency Hospital Admission (emergent condition) _12  - 0 Simple Discharge Coordination _13  - 0 Complex (extensive) Discharge Coordination PROCESS - Special Needs _14  - 0 Pediatric / Minor Patient Management _15  - 0 Isolation Patient Management _16  - 0 Hearing / Language / Visual special needs _17  -  0 Assessment of Community assistance (transportation, D/Mitchell planning, etc.) _18  - 0 Additional assistance / Altered mentation _19  - 0 Support Surface(s) Assessment (bed, cushion, seat, etc.) INTERVENTIONS - Wound Cleansing / Measurement  X - Simple Wound Cleansing - one wound 1 5 _0  - 0 Complex Wound Cleansing - multiple wounds _1  - 0 Wound Imaging (photographs - any number of wounds) _2  - 0 Wound Tracing (instead of photographs) _3  - 0 Simple Wound Measurement - one wound _4  - 0 Complex Wound Measurement - multiple wounds INTERVENTIONS - Wound Dressings X - Small Wound Dressing one or multiple wounds 1 10 _5  - 0 Medium Wound Dressing one or multiple wounds _6  - 0 Large Wound Dressing one or multiple wounds X- 1 5 Application of Medications - topical <[id number]>_6<\/[id number]>_1  - 0 Application of Medications - injection INTERVENTIONS - Miscellaneous _8  - 0 External ear exam _9  - 0 Specimen Collection (cultures, biopsies, blood, body fluids, etc.) _10  - 0 Specimen(s) / Culture(s) sent or taken to Lab for analysis _11  - 0 Patient Transfer (multiple staff / Civil Service fast streamer / Similar devices) _12  - 0 Simple Staple / Suture removal (25 or less) _13  - 0 Complex Staple / Suture removal (26 or more) _14  - 0 Hypo / Hyperglycemic Management (close monitor of Blood Glucose) [name], [name] ([id number]) [id number] Page 3 of 13 _15  - 0 Ankle / Brachial Index (ABI) - do not check if billed separately X- 1 5 Vital Signs Has the patient been seen at the hospital within the last three years: Yes Total Score: 85 Level Of Care: New/Established - Level 3 Electronic Signature(s) Signed: 03/31/2022 5:17:14 PM By: Blanche East RN Entered By: Blanche East on 03/31/2022 12:47:57 -------------------------------------------------------------------------------- Encounter Discharge Information Details Patient Name: Date of Service: EDWA [name], Cynthia Marseilles. 03/31/2022 10:30 A M Medical Record Number:  [id number] Patient Account Number: [id number] Date of Birth/Sex: Treating RN: [birth year] (88 y.o. Marta Lamas Primary Care Virgie Kunda: Deland Pretty Other Clinician: Referring Milen Lengacher: Treating Welden Hausmann/Extender: Ronni Rumble in Treatment: 4 Encounter Discharge Information Items Discharge Condition: Stable Ambulatory Status: Wheelchair Discharge Destination: Home Transportation: Private Auto Accompanied By: spouse, case worker Schedule Follow-up Appointment: Yes Clinical Summary of Care: Electronic Signature(s) Signed: 03/31/2022 5:17:14 PM By: Blanche East RN Entered By: Blanche East on 03/31/2022 11:20:07 -------------------------------------------------------------------------------- Lower Extremity Assessment Details Patient Name: Date of Service: EDWA RDSCathlean Marseilles 03/31/2022 10:30 A M Medical Record Number: [id number] Patient Account Number: [id number] Date of Birth/Sex: Treating RN: 11/06/1931 (88 y.o. Marta Lamas Primary Care Elaijah Munoz: Deland Pretty Other Clinician: Referring Mckynna Vanloan: Treating Jaiona Simien/Extender: Ronni Rumble in Treatment: 4 Edema Assessment Assessed: Shirlyn Goltz: No] [name]: No] [Left: Edema] [Right: :] Calf Left: Right: Point of Measurement: From Medial Instep 29 cm Ankle Left: Right: Point of Measurement: From Medial Instep 18 cm Vascular Assessment Mckeel, Cumi Mitchell ([id number]) [Right:[id number] Page 4 of 13] Pulses: Dorsalis Pedis Palpable: [Left:Yes] Electronic Signature(s) Signed: 03/31/2022 5:17:14 PM By: Blanche East RN Entered By: Blanche East on 03/31/2022 10:40:30 -------------------------------------------------------------------------------- Multi Wound Chart Details Patient Name: Date of Service: EDWA [name], Cynthia Marseilles. 03/31/2022 10:30 A M Medical Record Number: [id number] Patient Account Number: [id number] Date of Birth/Sex: Treating RN: 1931/07/05 (88  y.o. F) Primary Care Hussain Maimone: Deland Pretty Other Clinician: Referring Neysa Arts: Treating Cylah Fannin/Extender: Ronni Rumble in Treatment: 4 Vital Signs Height(in): 3 Pulse(bpm): 83 Weight(lbs): 133 Blood Pressure(mmHg): 118/79 Body Mass Index(BMI): 21.5 Temperature(F): 97.6 Respiratory Rate(breaths/min): 18 [1:Photos:] Left, Posterior Lower Leg Left, Posterior, Superior Lower Leg Left, Medial Lower Leg Wound Location: Skin T ear/Laceration Skin Tear/Laceration Skin T ear/Laceration Wounding Event: Venous Leg Ulcer Venous Leg Ulcer Venous Leg Ulcer Primary Etiology: Hypertension, Dementia Hypertension,  Dementia Hypertension, Dementia Comorbid History: 02/08/2022 02/04/2022 02/04/2022 Date Acquired: _0 Weeks of Treatment: Healed - Epithelialized Open Healed - Epithelialized Wound Status: No No No Wound Recurrence: 0x0x0 1.2x1x0.1 0x0x0 Measurements L x W x D (cm) 0 0.942 0 A (cm) : rea 0 0.094 0 Volume (cm) : 100.00% 89.60% 100.00% % Reduction in A rea: 100.00% 89.60% 100.00% % Reduction in Volume: Full Thickness Without Exposed Full Thickness Without Exposed Full Thickness Without Exposed Classification: Support Structures Support Structures Support Structures Medium Medium Medium Exudate A mount: Serous Serous Serous Exudate Type: Geophysical data processor Exudate Color: N/A N/A Flat and Intact Wound Margin: Medium (34-66%) Medium (34-66%) Medium (34-66%) Granulation Amount: Red, Pink Pink Red, Pink Granulation Quality: Medium (34-66%) Medium (34-66%) Medium (34-66%) Necrotic Amount: N/A Eschar Eschar Necrotic Tissue: Fat Layer (Subcutaneous Tissue): Yes N/A Fat Layer (Subcutaneous Tissue): Yes Exposed Structures: Fascia: No Fascia: No Tendon: No Tendon: No Muscle: No Muscle: No Joint: No Joint: No Bone: No Bone: No Small (1-33%) Small (1-33%) None Epithelialization: No Abnormalities Noted No Abnormalities  Noted Periwound Skin Texture: Dry/Scaly: Yes Maceration: No Dry/Scaly: Yes Periwound Skin Moisture: Maceration: No Dry/Scaly: No No Abnormalities Noted No Abnormalities Noted Periwound Skin Color: No Abnormality No Abnormality N/A TemperatureSHINA, [name] ([id number]) [id number] Page 5 of 13 Treatment Notes Wound #1 (Lower Leg) Wound Laterality: Left, Posterior Cleanser Soap and Water Discharge Instruction: May shower and wash wound with dial antibacterial soap and water prior to dressing change. Peri-Wound Care Sween Lotion (Moisturizing lotion) Discharge Instruction: Apply moisturizing lotion as directed Topical Primary Dressing IODOFLEX 0.9% Cadexomer Iodine Pad 4x6 cm Discharge Instruction: Apply to wound bed as instructed Secondary Dressing Zetuvit Plus 4x8 in Discharge Instruction: Apply over primary dressing as directed. Secured With Compression Wrap Kerlix Roll 4.5x3.1 (in/yd) Discharge Instruction: Apply Kerlix and Coban compression as directed. Coban Self-Adherent Wrap 4x5 (in/yd) Discharge Instruction: Apply over Kerlix as directed. Compression Stockings Add-Ons Wound #2 (Lower Leg) Wound Laterality: Left, Posterior, Superior Cleanser Soap and Water Discharge Instruction: May shower and wash wound with dial antibacterial soap and water prior to dressing change. Peri-Wound Care Sween Lotion (Moisturizing lotion) Discharge Instruction: Apply moisturizing lotion as directed Topical Primary Dressing IODOFLEX 0.9% Cadexomer Iodine Pad 4x6 cm Discharge Instruction: Apply to wound bed as instructed Secondary Dressing Zetuvit Plus 4x8 in Discharge Instruction: Apply over primary dressing as directed. Secured With Compression Wrap Kerlix Roll 4.5x3.1 (in/yd) Discharge Instruction: Apply Kerlix and Coban compression as directed. Coban Self-Adherent Wrap 4x5 (in/yd) Discharge Instruction: Apply over Kerlix as  directed. Compression Stockings Add-Ons Wound #3 (Lower Leg) Wound Laterality: Left, Medial Cleanser Soap and Water Discharge Instruction: May shower and wash wound with dial antibacterial soap and water prior to dressing change. Peri-Wound Care Sween Lotion (Moisturizing lotion) [name], [name] ([id number]) [id number] Page 6 of 13 Discharge Instruction: Apply moisturizing lotion as directed Topical Primary Dressing IODOFLEX 0.9% Cadexomer Iodine Pad 4x6 cm Discharge Instruction: Apply to wound bed as instructed Secondary Dressing Zetuvit Plus 4x8 in Discharge Instruction: Apply over primary dressing as directed. Secured With Compression Wrap Kerlix Roll 4.5x3.1 (in/yd) Discharge Instruction: Apply Kerlix and Coban compression as directed. Coban Self-Adherent Wrap 4x5 (in/yd) Discharge Instruction: Apply over Kerlix as directed. Compression Stockings Add-Ons Electronic Signature(s) Signed: 03/31/2022 4:40:59 PM By: Linton Ham MD Entered By: Linton Ham on 03/31/2022 11:43:42 -------------------------------------------------------------------------------- Multi-Disciplinary Care Plan Details Patient Name: Date of Service: EDWA [name], Cynthia Marseilles. 03/31/2022 10:30 A M Medical Record Number: [id number] Patient Account Number: [id number] Date  of Birth/Sex: Treating RN: May 16, 1931 (88 y.o. Marta Lamas Primary Care Whitleigh Garramone: Deland Pretty Other Clinician: Referring Shalini Mair: Treating Aquinnah Devin/Extender: Ronni Rumble in Treatment: 4 Active Inactive Orientation to the Wound Care Program Nursing Diagnoses: Knowledge deficit related to the wound healing center program Goals: Patient/caregiver will verbalize understanding of the [place] Date Initiated: 03/01/2022 Target Resolution Date: 03/22/2022 Goal Status: Active Interventions: Provide education on orientation to the wound center Notes: Venous  Leg Ulcer Nursing Diagnoses: Potential for venous Insuffiency (use before diagnosis confirmed) Goals: Patient will maintain optimal edema control Date Initiated: 03/01/2022 Target Resolution Date: 05/11/2022 Goal Status: Active Interventions: [name], [name] ([id number]) [id number] Page 7 of 13 Provide education on venous insufficiency Treatment Activities: Therapeutic compression applied : 03/01/2022 Notes: Wound/Skin Impairment Nursing Diagnoses: Knowledge deficit related to ulceration/compromised skin integrity Goals: Ulcer/skin breakdown will have a volume reduction of 30% by week 4 Date Initiated: 03/01/2022 Date Inactivated: 03/31/2022 Target Resolution Date: 03/29/2022 Goal Status: Met Ulcer/skin breakdown will have a volume reduction of 50% by week 8 Date Initiated: 03/31/2022 Target Resolution Date: 05/12/2022 Goal Status: Active Interventions: Assess ulceration(s) every visit Treatment Activities: Topical wound management initiated : 03/01/2022 Notes: Electronic Signature(s) Signed: 03/31/2022 10:53:35 AM By: Blanche East RN Entered By: Blanche East on 03/31/2022 10:53:35 -------------------------------------------------------------------------------- Pain Assessment Details Patient Name: Date of Service: EDWA RDSCathlean Marseilles 03/31/2022 10:30 A M Medical Record Number: [id number] Patient Account Number: [id number] Date of Birth/Sex: Treating RN: 06-02-1931 (88 y.o. Marta Lamas Primary Care Kiora Hallberg: Deland Pretty Other Clinician: Referring Davionne Dowty: Treating Abeni Finchum/Extender: Ronni Rumble in Treatment: 4 Active Problems Location of Pain Severity and Description of Pain Patient Has Paino No Site Locations Rate the pain. Current Pain Level: 0 Pain Management and Medication Current Pain Management: JAKAIYA, [place] ([id number]) [id number] Page 8 of 13 Electronic  Signature(s) Signed: 03/31/2022 5:17:14 PM By: Blanche East RN Entered By: Blanche East on 03/31/2022 10:37:57 -------------------------------------------------------------------------------- Patient/Caregiver Education Details Patient Name: Date of Service: EDWA [name], Cynthia Marseilles 12/15/2023andnbsp10:30 A M Medical Record Number: [id number] Patient Account Number: [id number] Date of Birth/Gender: Treating RN: 15-Jun-1931 (88 y.o. Marta Lamas Primary Care Physician: Deland Pretty Other Clinician: Referring Physician: Treating Physician/Extender: Ronni Rumble in Treatment: 4 Education Assessment Education Provided To: Patient Education Topics Provided Venous: Methods: Explain/Verbal Responses: Reinforcements needed, State content correctly [place]: o Methods: Explain/Verbal Responses: Reinforcements needed, State content correctly Electronic Signature(s) Signed: 03/31/2022 5:17:14 PM By: Blanche East RN Entered By: Blanche East on 03/31/2022 10:53:50 -------------------------------------------------------------------------------- Wound Assessment Details Patient Name: Date of Service: EDWA [name], Cynthia Marseilles 03/31/2022 10:30 A M Medical Record Number: [id number] Patient Account Number: [id number] Date of Birth/Sex: Treating RN: Oct 16, 1931 (88 y.o. Marta Lamas Primary Care Lilian Fuhs: Deland Pretty Other Clinician: Referring Kasandra Fehr: Treating Ia Leeb/Extender: Ronni Rumble in Treatment: 4 Wound Status Wound Number: 1 Primary Etiology: Venous Leg Ulcer Wound Location: Left, Posterior Lower Leg Wound Status: Healed - Epithelialized Wounding Event: Skin Tear/Laceration Notes: cluster of 3 wounds Date Acquired: 02/08/2022 Comorbid History: Hypertension, Dementia Weeks Of Treatment: 4 Clustered Wound: No Photos [name], [name] ([id number]) [id number] Page 9 of 13 Wound  Measurements Length: (cm) Width: (cm) Depth: (cm) Area: (cm) Volume: (cm) 0 % Reduction in Area: 100% 0 % Reduction in Volume: 100% 0 Epithelialization: Small (1-33%) 0 Tunneling: No 0 Undermining: No Wound Description Classification: Full Thickness Without Exposed Support Structures Exudate Amount: Medium Exudate Type:  Serous Exudate Color: amber Foul Odor After Cleansing: No Slough/Fibrino Yes Wound Bed Granulation Amount: Medium (34-66%) Exposed Structure Granulation Quality: Red, Pink Fascia Exposed: No Necrotic Amount: Medium (34-66%) Fat Layer (Subcutaneous Tissue) Exposed: Yes Tendon Exposed: No Muscle Exposed: No Joint Exposed: No Bone Exposed: No Periwound Skin Texture Texture Color No Abnormalities Noted: Yes No Abnormalities Noted: Yes Moisture Temperature / Pain No Abnormalities Noted: No Temperature: No Abnormality Dry / Scaly: Yes Maceration: No Treatment Notes Wound #1 (Lower Leg) Wound Laterality: Left, Posterior Cleanser Soap and Water Discharge Instruction: May shower and wash wound with dial antibacterial soap and water prior to dressing change. Peri-Wound Care Sween Lotion (Moisturizing lotion) Discharge Instruction: Apply moisturizing lotion as directed Topical Primary Dressing IODOFLEX 0.9% Cadexomer Iodine Pad 4x6 cm Discharge Instruction: Apply to wound bed as instructed Secondary Dressing Zetuvit Plus 4x8 in Discharge Instruction: Apply over primary dressing as directed. Secured With Compression Wrap Kerlix Roll 4.5x3.1 (in/yd) Discharge Instruction: Apply Kerlix and Coban compression as directed. Coban Self-Adherent Wrap 4x5 (in/yd) Discharge Instruction: Apply over Kerlix as directed. [name], [name] ([id number]) [id number] Page 10 of 13 Compression Stockings Add-Ons Electronic Signature(s) Signed: 03/31/2022 5:17:14 PM By: Blanche East RN Entered By: Blanche East on 03/31/2022  11:15:50 -------------------------------------------------------------------------------- Wound Assessment Details Patient Name: Date of Service: EDWA [name], Cynthia Marseilles 03/31/2022 10:30 A M Medical Record Number: [id number] Patient Account Number: [id number] Date of Birth/Sex: Treating RN: 09-09-1931 (88 y.o. Iver Nestle, Jamie Primary Care Mihira Tozzi: Deland Pretty Other Clinician: Referring Nelsie Domino: Treating Melanie Openshaw/Extender: Ronni Rumble in Treatment: 4 Wound Status Wound Number: 2 Primary Etiology: Venous Leg Ulcer Wound Location: Left, Posterior, Superior Lower Leg Wound Status: Open Wounding Event: Skin Tear/Laceration Comorbid History: Hypertension, Dementia Date Acquired: 02/04/2022 Weeks Of Treatment: 4 Clustered Wound: No Photos Wound Measurements Length: (cm) 1.2 Width: (cm) 1 Depth: (cm) 0.1 Area: (cm) 0.942 Volume: (cm) 0.094 % Reduction in Area: 89.6% % Reduction in Volume: 89.6% Epithelialization: Small (1-33%) Tunneling: No Undermining: No Wound Description Classification: Full Thickness Without Exposed Support Exudate Amount: Medium Exudate Type: Serous Exudate Color: amber Structures Foul Odor After Cleansing: No Slough/Fibrino Yes Wound Bed Granulation Amount: Medium (34-66%) Granulation Quality: Pink Necrotic Amount: Medium (34-66%) Necrotic Quality: Eschar Periwound Skin Texture Texture Color No Abnormalities Noted: Yes No Abnormalities Noted: Yes Moisture Temperature / Pain No Abnormalities Noted: No Temperature: No Abnormality Dry / Scaly: No Maceration: No [name], [name] ([id number]) [id number] Page 11 of 13 Treatment Notes Wound #2 (Lower Leg) Wound Laterality: Left, Posterior, Superior Cleanser Soap and Water Discharge Instruction: May shower and wash wound with dial antibacterial soap and water prior to dressing change. Peri-Wound Care Sween Lotion (Moisturizing lotion) Discharge  Instruction: Apply moisturizing lotion as directed Topical Primary Dressing IODOFLEX 0.9% Cadexomer Iodine Pad 4x6 cm Discharge Instruction: Apply to wound bed as instructed Secondary Dressing Zetuvit Plus 4x8 in Discharge Instruction: Apply over primary dressing as directed. Secured With Compression Wrap Kerlix Roll 4.5x3.1 (in/yd) Discharge Instruction: Apply Kerlix and Coban compression as directed. Coban Self-Adherent Wrap 4x5 (in/yd) Discharge Instruction: Apply over Kerlix as directed. Compression Stockings Add-Ons Electronic Signature(s) Signed: 03/31/2022 5:17:14 PM By: Blanche East RN Entered By: Blanche East on 03/31/2022 10:47:36 -------------------------------------------------------------------------------- Wound Assessment Details Patient Name: Date of Service: EDWA [name], Cynthia Marseilles 03/31/2022 10:30 A M Medical Record Number: [id number] Patient Account Number: [id number] Date of Birth/Sex: Treating RN: 11-27-1931 (88 y.o. Marta Lamas Primary Care Jermine Bibbee: Deland Pretty Other Clinician: Referring Milianna Ericsson: Treating Janissa Bertram/Extender: Michaell Cowing,  Charlean Sanfilippo in Treatment: 4 Wound Status Wound Number: 3 Primary Etiology: Venous Leg Ulcer Wound Location: Left, Medial Lower Leg Wound Status: Healed - Epithelialized Wounding Event: Skin Tear/Laceration Comorbid History: Hypertension, Dementia Date Acquired: 02/04/2022 Weeks Of Treatment: 4 Clustered Wound: No Photos [name], [name] ([id number]) [id number] Page 12 of 13 Wound Measurements Length: (cm) Width: (cm) Depth: (cm) Area: (cm) Volume: (cm) 0 % Reduction in Area: 100% 0 % Reduction in Volume: 100% 0 Epithelialization: None 0 Tunneling: No 0 Undermining: No Wound Description Classification: Full Thickness Without Exposed Support Wound Margin: Flat and Intact Exudate Amount: Medium Exudate Type: Serous Exudate Color: amber Structures Foul Odor After  Cleansing: No Slough/Fibrino Yes Wound Bed Granulation Amount: Medium (34-66%) Exposed Structure Granulation Quality: Red, Pink Fascia Exposed: No Necrotic Amount: Medium (34-66%) Fat Layer (Subcutaneous Tissue) Exposed: Yes Necrotic Quality: Eschar Tendon Exposed: No Muscle Exposed: No Joint Exposed: No Bone Exposed: No Periwound Skin Texture Texture Color No Abnormalities Noted: No No Abnormalities Noted: No Moisture No Abnormalities Noted: No Dry / Scaly: Yes Treatment Notes Wound #3 (Lower Leg) Wound Laterality: Left, Medial Cleanser Soap and Water Discharge Instruction: May shower and wash wound with dial antibacterial soap and water prior to dressing change. Peri-Wound Care Sween Lotion (Moisturizing lotion) Discharge Instruction: Apply moisturizing lotion as directed Topical Primary Dressing IODOFLEX 0.9% Cadexomer Iodine Pad 4x6 cm Discharge Instruction: Apply to wound bed as instructed Secondary Dressing Zetuvit Plus 4x8 in Discharge Instruction: Apply over primary dressing as directed. Secured With Compression Wrap Kerlix Roll 4.5x3.1 (in/yd) Discharge Instruction: Apply Kerlix and Coban compression as directed. Coban Self-Adherent Wrap 4x5 (in/yd) Discharge Instruction: Apply over Kerlix as directed. [name], [name] ([id number]) [id number] Page 13 of 13 Compression Stockings Add-Ons Electronic Signature(s) Signed: 03/31/2022 5:17:14 PM By: Blanche East RN Entered By: Blanche East on 03/31/2022 11:15:50 -------------------------------------------------------------------------------- Vitals Details Patient Name: Date of Service: EDWA [name], Cynthia [place]. 03/31/2022 10:30 A M Medical Record Number: [id number] Patient Account Number: [id number] Date of Birth/Sex: Treating RN: Jun 30, 1931 (88 y.o. Iver Nestle, Jamie Primary Care Saladin Petrelli: Deland Pretty Other Clinician: Referring Sherrise Liberto: Treating Lavarius Doughten/Extender: Ronni Rumble in Treatment: 4 Vital Signs Time Taken: 10:37 Temperature (F): 97.6 Height (in): 66 Pulse (bpm): 79 Weight (lbs): 133 Respiratory Rate (breaths/min): 18 Body Mass Index (BMI): 21.5 Blood Pressure (mmHg): 118/79 Reference Range: 80 - 120 mg / dl Electronic Signature(s) Signed: 03/31/2022 5:17:14 PM By: Blanche East RN Entered By: Blanche East on 03/31/2022 10:37:49

## 2022-03-31 NOTE — Progress Notes (Signed)
[name], POWNALL ([id number]) [id number] Page 1 of 5 Visit Report for 03/31/2022 HPI Details Patient Name: Date of Service: EDWA [name] 03/31/2022 10:30 A M Medical Record Number: [id number] Patient Account Number: [id number] Date of Birth/Sex: Treating RN: Dec 21, 1931 (88 y.o. F) Primary Care Provider: Deland Pretty Other Clinician: Referring Provider: Treating Provider/Extender: Ronni Rumble in Treatment: 4 History of Present Illness HPI Description: ADMISSION 03/01/2022 This is an 88 year old woman with dementia, residing in a memory care facility. She has a past medical history significant for renal failure, status post kidney transplant and now off dialysis. She is on chronic immunosuppression with tacrolimus and prednisone. She was recently admitted with an extensive DVT of the left lower extremity, identified when her nursing facility staff noticed extreme swelling of her left lower leg. She was initiated on Eliquis. Vascular surgery saw her and recommended compression stockings. Unfortunately, her skin is so thin that the stockings, combined with the leg swelling, resulted in multiple wounds opening up on her left lower leg. ABI in clinic today was only 0.61. There are multiple lesions open on her leg, including a cluster of 3 just above the lateral ankle, 1 on her posterior calf, and another on the medial lower leg. They all have thick slough and eschar present. The skin of her leg is discolored and blue. 03/07/2022: All of the wounds are cleaner today. They have slough present but the eschar has softened. Edema control is good. 12/15; 2 of her 3 wounds are closed the only 1 remaining on the left lower leg is the 1 on the posterior calf and this looks healthy and measuring smaller. We have been using silver alginate with kerlix and Coban. Patient lives in the dementia unit at [facility] facility which is a life care  facility. Electronic Signature(s) Signed: 03/31/2022 4:40:59 PM By: Linton Ham MD Entered By: Linton Ham on 03/31/2022 11:45:51 -------------------------------------------------------------------------------- Physical Exam Details Patient Name: Date of Service: EDWA [name], Cynthia Roof C. 03/31/2022 10:30 A M Medical Record Number: [id number] Patient Account Number: [id number] Date of Birth/Sex: Treating RN: Aug 24, 1931 (88 y.o. F) Primary Care Provider: Deland Pretty Other Clinician: Referring Provider: Treating Provider/Extender: Ronni Rumble in Treatment: 4 Constitutional Sitting or standing Blood Pressure is within target range for patient.. Pulse regular and within target range for patient.Marland Kitchen Respirations regular, non-labored and within target range.. Temperature is normal and within the target range for the patient.Marland Kitchen Appears in no distress. Notes Wound exam; 2 of the wounds on the left leg are healed the area in the posterior left calf is open but is contracted. Healthy looking granulation. The patient has severe hemosiderin deposition bilaterally. Edema is controlled in both legs Electronic Signature(s) Signed: 03/31/2022 4:40:59 PM By: Linton Ham MD Entered By: Linton Ham on 03/31/2022 11:46:53 [place], [name] ([id number]) [id number] Page 2 of 5 -------------------------------------------------------------------------------- Physician Orders Details Patient Name: Date of Service: EDWA [name] 03/31/2022 10:30 A M Medical Record Number: [id number] Patient Account Number: [id number] Date of Birth/Sex: Treating RN: 1932-02-16 (88 y.o. Marta Lamas Primary Care Provider: Deland Pretty Other Clinician: Referring Provider: Treating Provider/Extender: Ronni Rumble in Treatment: 4 Verbal / Phone Orders: No Diagnosis Coding Follow-up Appointments Return appointment in 3 weeks. - Dr.  Gwynneth Munson Anesthetic Wound #2 Left,Posterior,Superior Lower Leg (In clinic) Topical Lidocaine 5% applied to wound bed Bathing/ Shower/ Hygiene May shower and wash wound with soap and water. Edema Control - Lymphedema / SCD / Other  Elevate legs to the level of the heart or above for 30 minutes daily and/or when sitting, a frequency of: Avoid standing for long periods of time. Compression stocking or Garment 20-30 mm/Hg pressure to: - wear on both legs Wound Treatment Wound #2 - Lower Leg Wound Laterality: Left, Posterior, Superior Cleanser: Soap and Water Discharge Instructions: May shower and wash wound with dial antibacterial soap and water prior to dressing change. Peri-Wound Care: Sween Lotion (Moisturizing lotion) Discharge Instructions: Apply moisturizing lotion as directed Prim Dressing: Xeroform Occlusive Gauze Dressing, 4x4 in ary Discharge Instructions: Apply to wound bed as instructed Secondary Dressing: Zetuvit Plus 4x8 in Discharge Instructions: Apply over primary dressing as directed. Compression Wrap: Kerlix Roll 4.5x3.1 (in/yd) Discharge Instructions: Apply Kerlix and Coban compression as directed. Electronic Signature(s) Signed: 03/31/2022 4:40:59 PM By: Linton Ham MD Signed: 03/31/2022 5:17:14 PM By: Blanche East RN Previous Signature: 03/31/2022 10:55:05 AM Version By: Blanche East RN Entered By: Blanche East on 03/31/2022 11:19:21 -------------------------------------------------------------------------------- Problem List Details Patient Name: Date of Service: EDWA [name], [name]. 03/31/2022 10:30 A M Medical Record Number: [id number] Patient Account Number: [id number] Date of Birth/Sex: Treating RN: 1931/12/25 (88 y.o. F) Primary Care Provider: Deland Pretty Other Clinician: Referring Provider: Treating Provider/Extender: Ronni Rumble in Treatment: [address], [name] ([id number]) [id number] Page 3 of  5 Active Problems ICD-10 Encounter Code Description Active Date MDM Diagnosis L97.822 Non-pressure chronic ulcer of other part of left lower leg with fat layer 03/01/2022 No Yes exposed I82.492 Acute embolism and thrombosis of other specified deep vein of left lower 03/01/2022 No Yes extremity F02.80 Dementia in other diseases classified elsewhere, unspecified severity, without 03/01/2022 No Yes behavioral disturbance, psychotic disturbance, mood disturbance, and anxiety Inactive Problems Resolved Problems Electronic Signature(s) Signed: 03/31/2022 4:40:59 PM By: Linton Ham MD Entered By: Linton Ham on 03/31/2022 11:43:34 -------------------------------------------------------------------------------- Progress Note Details Patient Name: Date of Service: EDWA [name], [name]. 03/31/2022 10:30 A M Medical Record Number: [id number] Patient Account Number: [id number] Date of Birth/Sex: Treating RN: 04-07-1932 (88 y.o. F) Primary Care Provider: Deland Pretty Other Clinician: Referring Provider: Treating Provider/Extender: Ronni Rumble in Treatment: 4 Subjective History of Present Illness (HPI) ADMISSION 03/01/2022 This is an 88 year old woman with dementia, residing in a memory care facility. She has a past medical history significant for renal failure, status post kidney transplant and now off dialysis. She is on chronic immunosuppression with tacrolimus and prednisone. She was recently admitted with an extensive DVT of the left lower extremity, identified when her nursing facility staff noticed extreme swelling of her left lower leg. She was initiated on Eliquis. Vascular surgery saw her and recommended compression stockings. Unfortunately, her skin is so thin that the stockings, combined with the leg swelling, resulted in multiple wounds opening up on her left lower leg. ABI in clinic today was only 0.61. There are multiple lesions open on her leg,  including a cluster of 3 just above the lateral ankle, 1 on her posterior calf, and another on the medial lower leg. They all have thick slough and eschar present. The skin of her leg is discolored and blue. 03/07/2022: All of the wounds are cleaner today. They have slough present but the eschar has softened. Edema control is good. 12/15; 2 of her 3 wounds are closed the only 1 remaining on the left lower leg is the 1 on the posterior calf and this looks healthy and measuring smaller. We have been using silver alginate with kerlix  and Coban. Patient lives in the dementia unit at [facility] facility which is a life care facility. Objective Constitutional Sitting or standing Blood Pressure is within target range for patient.. Pulse regular and within target range for patient.Marland Kitchen Respirations regular, non-labored and within target range.. Temperature is normal and within the target range for the patient.Marland Kitchen Appears in no distress. [name], [name] ([id number]) [id number] Page 4 of 5 Vitals Time Taken: 10:37 AM, Height: 66 in, Weight: 133 lbs, BMI: 21.5, Temperature: 97.6 F, Pulse: 79 bpm, Respiratory Rate: 18 breaths/min, Blood Pressure: 118/79 mmHg. General Notes: Wound exam; 2 of the wounds on the left leg are healed the area in the posterior left calf is open but is contracted. Healthy looking granulation. The patient has severe hemosiderin deposition bilaterally. Edema is controlled in both legs Integumentary (Hair, Skin) Wound #1 status is Healed - Epithelialized. Original cause of wound was Skin Tear/Laceration. The date acquired was: 02/08/2022. The wound has been in treatment 4 weeks. The wound is located on the Left,Posterior Lower Leg. The wound measures 0cm length x 0cm width x 0cm depth; 0cm^2 area and 0cm^3 volume. There is Fat Layer (Subcutaneous Tissue) exposed. There is no tunneling or undermining noted. There is a medium amount of serous drainage  noted. There is medium (34-66%) red, pink granulation within the wound bed. There is a medium (34-66%) amount of necrotic tissue within the wound bed. The periwound skin appearance had no abnormalities noted for texture. The periwound skin appearance had no abnormalities noted for color. The periwound skin appearance exhibited: Dry/Scaly. The periwound skin appearance did not exhibit: Maceration. Periwound temperature was noted as No Abnormality. Wound #2 status is Open. Original cause of wound was Skin T ear/Laceration. The date acquired was: 02/04/2022. The wound has been in treatment 4 weeks. The wound is located on the Left,Posterior,Superior Lower Leg. The wound measures 1.2cm length x 1cm width x 0.1cm depth; 0.942cm^2 area and 0.094cm^3 volume. There is no tunneling or undermining noted. There is a medium amount of serous drainage noted. There is medium (34-66%) pink granulation within the wound bed. There is a medium (34-66%) amount of necrotic tissue within the wound bed including Eschar. The periwound skin appearance had no abnormalities noted for texture. The periwound skin appearance had no abnormalities noted for color. The periwound skin appearance did not exhibit: Dry/Scaly, Maceration. Periwound temperature was noted as No Abnormality. Wound #3 status is Healed - Epithelialized. Original cause of wound was Skin Tear/Laceration. The date acquired was: 02/04/2022. The wound has been in treatment 4 weeks. The wound is located on the Left,Medial Lower Leg. The wound measures 0cm length x 0cm width x 0cm depth; 0cm^2 area and 0cm^3 volume. There is Fat Layer (Subcutaneous Tissue) exposed. There is no tunneling or undermining noted. There is a medium amount of serous drainage noted. The wound margin is flat and intact. There is medium (34-66%) red, pink granulation within the wound bed. There is a medium (34-66%) amount of necrotic tissue within the wound bed including Eschar. The periwound  skin appearance exhibited: Dry/Scaly. Assessment Active Problems ICD-10 Non-pressure chronic ulcer of other part of left lower leg with fat layer exposed Acute embolism and thrombosis of other specified deep vein of left lower extremity Dementia in other diseases classified elsewhere, unspecified severity, without behavioral disturbance, psychotic disturbance, mood disturbance, and anxiety Plan Follow-up Appointments: Return appointment in 3 weeks. - Dr. Gwynneth Munson Anesthetic: Wound #2 Left,Posterior,Superior Lower Leg: (In clinic) Topical Lidocaine 5% applied to wound bed  Bathing/ Shower/ Hygiene: May shower and wash wound with soap and water. Edema Control - Lymphedema / SCD / Other: Elevate legs to the level of the heart or above for 30 minutes daily and/or when sitting, a frequency of: Avoid standing for long periods of time. Compression stocking or Garment 20-30 mm/Hg pressure to: - wear on both legs WOUND #2: - Lower Leg Wound Laterality: Left, Posterior, Superior Cleanser: Soap and Water Discharge Instructions: May shower and wash wound with dial antibacterial soap and water prior to dressing change. Peri-Wound Care: Sween Lotion (Moisturizing lotion) Discharge Instructions: Apply moisturizing lotion as directed Prim Dressing: Xeroform Occlusive Gauze Dressing, 4x4 in ary Discharge Instructions: Apply to wound bed as instructed Secondary Dressing: Zetuvit Plus 4x8 in Discharge Instructions: Apply over primary dressing as directed. Com pression Wrap: Kerlix Roll 4.5x3.1 (in/yd) Discharge Instructions: Apply Kerlix and Coban compression as directed. 1. I did not change the primary dressing, Xeroform under kerlix Coban. Her wounds seem to be responding 2. I wrote instructions that she will need 20/30 below-knee stockings. If she is not compliant with this [dementia in a dementia unit] then something to protect the skin on her lower legs example Tubigrip, support stockings etc. would  be beneficial. Electronic Signature(s) Signed: 03/31/2022 4:40:59 PM By: Linton Ham MD Entered By: Linton Ham on 03/31/2022 11:47:55 [place], [name] ([id number]) [id number] Page 5 of 5 -------------------------------------------------------------------------------- SuperBill Details Patient Name: Date of Service: EDWA [name] 03/31/2022 Medical Record Number: [id number] Patient Account Number: [id number] Date of Birth/Sex: Treating RN: 22-Jun-1931 (88 y.o. F) Primary Care Provider: Deland Pretty Other Clinician: Referring Provider: Treating Provider/Extender: Ronni Rumble in Treatment: 4 Diagnosis Coding ICD-10 Codes Code Description [phone] Non-pressure chronic ulcer of other part of left lower leg with fat layer exposed I82.492 Acute embolism and thrombosis of other specified deep vein of left lower extremity Dementia in other diseases classified elsewhere, unspecified severity, without behavioral disturbance, psychotic disturbance, mood F02.80 disturbance, and anxiety Facility Procedures : CPT4 Code: [id number] Description: 99213 - WOUND CARE VISIT-LEV 3 EST PT Modifier: 25 Quantity: 1 Physician Procedures : CPT4 Code Description Modifier [id number] - WC PHYS LEVEL 3 - EST PT ICD-10 Diagnosis Description L97.822 Non-pressure chronic ulcer of other part of left lower leg with fat layer exposed Quantity: 1 Electronic Signature(s) Signed: 03/31/2022 12:49:29 PM By: Blanche East RN Signed: 03/31/2022 4:40:59 PM By: Linton Ham MD Entered By: Blanche East on 03/31/2022 12:49:29

## 2022-04-12 DIAGNOSIS — S99821A Other specified injuries of right foot, initial encounter: Secondary | ICD-10-CM | POA: Diagnosis not present

## 2022-04-12 DIAGNOSIS — I82402 Acute embolism and thrombosis of unspecified deep veins of left lower extremity: Secondary | ICD-10-CM | POA: Diagnosis not present

## 2022-04-12 DIAGNOSIS — S99921A Unspecified injury of right foot, initial encounter: Secondary | ICD-10-CM | POA: Diagnosis not present

## 2022-04-18 DIAGNOSIS — D849 Immunodeficiency, unspecified: Secondary | ICD-10-CM | POA: Diagnosis not present

## 2022-04-18 DIAGNOSIS — I824Z2 Acute embolism and thrombosis of unspecified deep veins of left distal lower extremity: Secondary | ICD-10-CM | POA: Diagnosis not present

## 2022-04-18 DIAGNOSIS — I456 Pre-excitation syndrome: Secondary | ICD-10-CM | POA: Diagnosis not present

## 2022-04-18 DIAGNOSIS — F03C0 Unspecified dementia, severe, without behavioral disturbance, psychotic disturbance, mood disturbance, and anxiety: Secondary | ICD-10-CM | POA: Diagnosis not present

## 2022-04-18 DIAGNOSIS — E039 Hypothyroidism, unspecified: Secondary | ICD-10-CM | POA: Diagnosis not present

## 2022-04-18 DIAGNOSIS — T8610 Unspecified complication of kidney transplant: Secondary | ICD-10-CM | POA: Diagnosis not present

## 2022-04-18 DIAGNOSIS — R627 Adult failure to thrive: Secondary | ICD-10-CM | POA: Diagnosis not present

## 2022-04-21 ENCOUNTER — Encounter (HOSPITAL_BASED_OUTPATIENT_CLINIC_OR_DEPARTMENT_OTHER): Payer: Medicare PPO | Attending: General Surgery | Admitting: General Surgery

## 2022-04-21 DIAGNOSIS — L97812 Non-pressure chronic ulcer of other part of right lower leg with fat layer exposed: Secondary | ICD-10-CM | POA: Insufficient documentation

## 2022-04-21 DIAGNOSIS — I82403 Acute embolism and thrombosis of unspecified deep veins of lower extremity, bilateral: Secondary | ICD-10-CM | POA: Insufficient documentation

## 2022-04-21 DIAGNOSIS — M79604 Pain in right leg: Secondary | ICD-10-CM | POA: Diagnosis not present

## 2022-04-21 DIAGNOSIS — F0284 Dementia in other diseases classified elsewhere, unspecified severity, with anxiety: Secondary | ICD-10-CM | POA: Insufficient documentation

## 2022-04-25 ENCOUNTER — Encounter (HOSPITAL_BASED_OUTPATIENT_CLINIC_OR_DEPARTMENT_OTHER): Payer: Medicare PPO | Admitting: General Surgery

## 2022-04-25 DIAGNOSIS — L03115 Cellulitis of right lower limb: Secondary | ICD-10-CM | POA: Diagnosis not present

## 2022-04-25 DIAGNOSIS — S81812A Laceration without foreign body, left lower leg, initial encounter: Secondary | ICD-10-CM | POA: Diagnosis not present

## 2022-04-25 DIAGNOSIS — L97812 Non-pressure chronic ulcer of other part of right lower leg with fat layer exposed: Secondary | ICD-10-CM | POA: Diagnosis not present

## 2022-04-25 DIAGNOSIS — S81801A Unspecified open wound, right lower leg, initial encounter: Secondary | ICD-10-CM | POA: Diagnosis not present

## 2022-04-25 DIAGNOSIS — I82403 Acute embolism and thrombosis of unspecified deep veins of lower extremity, bilateral: Secondary | ICD-10-CM | POA: Diagnosis not present

## 2022-04-25 DIAGNOSIS — F0284 Dementia in other diseases classified elsewhere, unspecified severity, with anxiety: Secondary | ICD-10-CM | POA: Diagnosis not present

## 2022-04-25 NOTE — Progress Notes (Signed)
[name], [name] ([id number]) [id number] Page 1 of 14 Visit Report for 04/25/2022 Arrival Information Details Patient Name: Date of Service: [name] 04/25/2022 8:30 A M Medical Record Number: [id number] Patient Account Number: [id number] Date of Birth/Sex: Treating RN: 13-Feb-1932 (89 y.o. Marta Lamas Primary Care Dominque Marlin: Deland Pretty Other Clinician: Referring Deepa Barthel: Treating Willean Schurman/Extender: Raylene Everts in Treatment: 7 Visit Information History Since Last Visit Added or deleted any medications: No Patient Arrived: Wheel Chair Any new allergies or adverse reactions: No Arrival Time: 08:39 Had a fall or experienced change in No Accompanied By: husband activities of daily living that may affect Transfer Assistance: Manual risk of falls: Patient Identification Verified: Yes Signs or symptoms of abuse/neglect since last visito No Secondary Verification Process Completed: Yes Hospitalized since last visit: No Patient Requires Transmission-Based Precautions: No Implantable device outside of the clinic excluding No Patient Has Alerts: No cellular tissue based products placed in the center since last visit: Has Dressing in Place as Prescribed: Yes Pain Present Now: No Electronic Signature(s) Signed: 04/25/2022 3:33:53 PM By: Blanche East RN Entered By: Blanche East on 04/25/2022 08:40:19 -------------------------------------------------------------------------------- Encounter Discharge Information Details Patient Name: Date of Service: [name], [name]. 04/25/2022 8:30 A M Medical Record Number: [id number] Patient Account Number: [id number] Date of Birth/Sex: Treating RN: 1931/09/27 (89 y.o. Marta Lamas Primary Care Cheron Coryell: Deland Pretty Other Clinician: Referring Lexxie Winberg: Treating Lucill Mauck/Extender: Raylene Everts in Treatment: 7 Encounter Discharge Information Items Post  Procedure Vitals Discharge Condition: Stable Temperature (F): 97.9 Ambulatory Status: Wheelchair Pulse (bpm): 85 Discharge Destination: [place] Respiratory Rate (breaths/min): 18 Telephoned: No Blood Pressure (mmHg): 120/73 Orders Sent: Yes Transportation: Private Auto Accompanied By: husband Schedule Follow-up Appointment: Yes Clinical Summary of Care: Electronic Signature(s) Signed: 04/25/2022 9:37:11 AM By: Blanche East RN Entered By: Blanche East on 04/25/2022 09:37:10 Scarano, [name] ([id number]) [id number] Page 2 of 14 -------------------------------------------------------------------------------- Lower Extremity Assessment Details Patient Name: Date of Service: [name] 04/25/2022 8:30 A M Medical Record Number: [id number] Patient Account Number: [id number] Date of Birth/Sex: Treating RN: 1931/07/20 (89 y.o. Marta Lamas Primary Care Verity Gilcrest: Deland Pretty Other Clinician: Referring Marsa Matteo: Treating Solimar Maiden/Extender: Raylene Everts in Treatment: 7 Edema Assessment Assessed: [Left: No] [Right: No] [Left: Edema] [Right: :] Calf Left: Right: Point of Measurement: From Medial Instep 29.5 cm 32.5 cm Ankle Left: Right: Point of Measurement: From Medial Instep 18.4 cm 18 cm Vascular Assessment Pulses: Dorsalis Pedis Palpable: [Left:Yes] [Right:Yes] Electronic Signature(s) Signed: 04/25/2022 3:33:53 PM By: Blanche East RN Entered By: Blanche East on 04/25/2022 08:41:32 -------------------------------------------------------------------------------- Multi Wound Chart Details Patient Name: Date of Service: [name], [name]. 04/25/2022 8:30 A M Medical Record Number: [id number] Patient Account Number: [id number] Date of Birth/Sex: Treating RN: 02-28-1932 (89 y.o. F) Primary Care Jayjay Littles: Deland Pretty Other Clinician: Referring Wilfred Dayrit: Treating Saagar Tortorella/Extender: Raylene Everts in Treatment: 7 Vital Signs Height(in): 66 Pulse(bpm): 85 Weight(lbs): 133 Blood Pressure(mmHg): 120/73 Body Mass Index(BMI): 21.5 Temperature(F): 97.9 Respiratory Rate(breaths/min): 18 [2:Photos: No Photos] Left, Posterior, Superior Lower Leg Right, Medial Lower Leg Left, Lateral Lower Leg Wound Location: Skin Tear/Laceration Blister Blister Wounding Event: ZYION, LEIDNER ([id number]) [phone].pdf Page 3 of 14 Venous Leg Ulcer Cellulitis Venous Leg Ulcer Primary Etiology: Hypertension, Dementia Hypertension, Dementia Hypertension, Dementia Comorbid History: 02/04/2022 04/18/2022 04/17/2022 Date Acquired: 7 0 0 Weeks of Treatment: Open Open Open Wound Status: No No No Wound Recurrence: 0.1x0.1x0.1 2.5x3.8x0.1  3x1.6x0.1 Measurements L x W x D (cm) 0.008 7.461 3.77 A (cm) : rea 0.001 0.746 0.377 Volume (cm) : 99.90% N/A N/A % Reduction in A rea: 99.90% N/A N/A % Reduction in Volume: Full Thickness Without Exposed Full Thickness Without Exposed Full Thickness Without Exposed Classification: Support Structures Support Structures Support Structures None Present Medium None Present Exudate A mount: N/A Serosanguineous N/A Exudate Type: N/A red, brown N/A Exudate Color: Large (67-100%) Small (1-33%) Small (1-33%) Granulation A mount: N/A Red, Pink Red Granulation Quality: None Present (0%) Large (67-100%) Large (67-100%) Necrotic A mount: N/A Eschar, Adherent Slough N/A Necrotic Tissue: Small (1-33%) N/A N/A Epithelialization: N/A Debridement - Selective/Open Wound N/A Debridement: Pre-procedure Verification/Time Out N/A 09:03 N/A Taken: N/A Lidocaine 5% topical ointment N/A Pain Control: N/A Slough N/A Tissue Debrided: N/A Non-Viable Tissue N/A Level: N/A 9.5 N/A Debridement A (sq cm): rea N/A Curette N/A Instrument: N/A Minimum N/A Bleeding: N/A Pressure N/A Hemostasis A chieved: N/A  Procedure was tolerated well N/A Debridement Treatment Response: N/A 2.5x3.8x0.1 N/A Post Debridement Measurements L x W x D (cm) N/A 0.746 N/A Post Debridement Volume: (cm) No Abnormalities Noted No Abnormalities Noted No Abnormalities Noted Periwound Skin Texture: Maceration: No Maceration: No Periwound Skin Moisture: Dry/Scaly: No Dry/Scaly: No No Abnormalities Noted Rubor: Yes Rubor: Yes Periwound Skin Color: No Abnormality N/A No Abnormality Temperature: N/A Debridement N/A Procedures Performed: Wound Number: 5 6 N/A Photos: No Photos N/A Left, Lateral Lower Leg Left, Posterior Lower Leg N/A Wound Location: Hematoma Blister N/A Wounding Event: Venous Leg Ulcer Venous Leg Ulcer N/A Primary Etiology: N/A Hypertension, Dementia N/A Comorbid History: 04/17/2022 04/17/2022 N/A Date Acquired: 0 0 N/A Weeks of Treatment: Healed - Epithelialized Open N/A Wound Status: No No N/A Wound Recurrence: 0x0x0 1x0.8x0.1 N/A Measurements L x W x D (cm) 0 0.628 N/A A (cm) : rea 0 0.063 N/A Volume (cm) : N/A N/A N/A % Reduction in A rea: N/A N/A N/A % Reduction in Volume: N/A Full Thickness Without Exposed N/A Classification: Support Structures N/A None Present N/A Exudate A mount: N/A N/A N/A Exudate Type: N/A N/A N/A Exudate Color: N/A Small (1-33%) N/A Granulation A mount: N/A Red N/A Granulation Quality: N/A Large (67-100%) N/A Necrotic A mount: N/A Eschar, Adherent Slough N/A Necrotic Tissue: N/A N/A N/A Epithelialization: N/A N/A N/A Debridement: N/A N/A N/A Pain Control: N/A N/A N/A Tissue Debrided: N/A N/A N/A Level: N/A N/A N/A Debridement A (sq cm): rea N/A N/A N/A Instrument: N/A N/A N/A Bleeding: N/A N/A N/A Hemostasis A chieved: N/A N/A N/A Debridement Treatment ResponseTRELLIS, [name] ([id number]) [phone].pdf Page 4 of 14 N/A N/A N/A Post Debridement Measurements L x W x D (cm) N/A N/A N/A Post  Debridement Volume: (cm) No Abnormalities Noted No Abnormalities Noted N/A Periwound Skin Texture: No Abnormalities Noted Maceration: No N/A Periwound Skin Moisture: Dry/Scaly: No No Abnormalities Noted Rubor: Yes N/A Periwound Skin Color: N/A N/A N/A Temperature: N/A N/A N/A Procedures Performed: Treatment Notes Wound #2 (Lower Leg) Wound Laterality: Left, Posterior, Superior Cleanser Soap and Water Discharge Instruction: May shower and wash wound with dial antibacterial soap and water prior to dressing change. Peri-Wound Care Sween Lotion (Moisturizing lotion) Discharge Instruction: Apply moisturizing lotion as directed Topical Primary Dressing Secondary Dressing Zetuvit Plus Silicone Border Dressing 4x4 (in/in) Discharge Instruction: Apply silicone border over primary dressing as directed. Secured With Compression Wrap Kerlix Roll 4.5x3.1 (in/yd) Discharge Instruction: Apply Kerlix and Coban compression as directed. Compression Stockings Add-Ons Wound #4 (Lower Leg)  Wound Laterality: Right, Medial Cleanser Soap and Water Discharge Instruction: May shower and wash wound with dial antibacterial soap and water prior to dressing change. Peri-Wound Care Sween Lotion (Moisturizing lotion) Discharge Instruction: Apply moisturizing lotion as directed Topical Primary Dressing IODOFLEX 0.9% Cadexomer Iodine Pad 4x6 cm Discharge Instruction: Apply to wound bed as instructed Secondary Dressing Zetuvit Plus Silicone Border Dressing 4x4 (in/in) Discharge Instruction: Apply silicone border over primary dressing as directed. Secured With Elastic Bandage 4 inch (ACE bandage) Discharge Instruction: Secure with ACE bandage as directed. Compression Wrap Kerlix Roll 4.5x3.1 (in/yd) Discharge Instruction: Apply Kerlix and Coban compression as directed. Compression Stockings Add-Ons Wound #5 (Lower Leg) Wound Laterality: Left, Lateral Cleanser Soap and Water Discharge  Instruction: May shower and wash wound with dial antibacterial soap and water prior to dressing change. [name], [name] ([id number]) [id number] Page 5 of 14 Peri-Wound Care Sween Lotion (Moisturizing lotion) Discharge Instruction: Apply moisturizing lotion as directed Topical Primary Dressing Secondary Dressing Zetuvit Plus Silicone Border Dressing 4x4 (in/in) Discharge Instruction: Apply silicone border over primary dressing as directed. Secured With Elastic Bandage 4 inch (ACE bandage) Discharge Instruction: Secure with ACE bandage as directed. Compression Wrap Kerlix Roll 4.5x3.1 (in/yd) Discharge Instruction: Apply Kerlix and Coban compression as directed. Compression Stockings Add-Ons Wound #5 (Lower Leg) Wound Laterality: Left, Lateral Cleanser Peri-Wound Care Topical Primary Dressing Secondary Dressing Secured With Compression Wrap Compression Stockings Add-Ons Wound #6 (Lower Leg) Wound Laterality: Left, Posterior Cleanser Soap and Water Discharge Instruction: May shower and wash wound with dial antibacterial soap and water prior to dressing change. Peri-Wound Care Sween Lotion (Moisturizing lotion) Discharge Instruction: Apply moisturizing lotion as directed Topical Primary Dressing Secondary Dressing Zetuvit Plus Silicone Border Dressing 4x4 (in/in) Discharge Instruction: Apply silicone border over primary dressing as directed. Secured With Elastic Bandage 4 inch (ACE bandage) Discharge Instruction: Secure with ACE bandage as directed. Compression Wrap Kerlix Roll 4.5x3.1 (in/yd) Discharge Instruction: Apply Kerlix and Coban compression as directed. Compression Stockings Add-Ons Electronic Signature(s) Signed: 04/25/2022 9:48:33 AM By: Fredirick Maudlin MD FACS [address], Magdalena Mitchell ([id number]) By: Fredirick Maudlin MD FACS [id number].pdf Page 6 of 14 Signed: 04/25/2022 9:48:33 AM Entered By: Fredirick Maudlin on  04/25/2022 09:48:33 -------------------------------------------------------------------------------- Multi-Disciplinary Care Plan Details Patient Name: Date of Service: [name] 04/25/2022 8:30 A M Medical Record Number: [id number] Patient Account Number: [id number] Date of Birth/Sex: Treating RN: [birth year] (89 y.o. Marta Lamas Primary Care Embry Huss: Deland Pretty Other Clinician: Referring Jaquae Rieves: Treating Justan Gaede/Extender: Raylene Everts in Treatment: 7 Active Inactive Orientation to the Wound Care Program Nursing Diagnoses: Knowledge deficit related to the wound healing center program Goals: Patient/caregiver will verbalize understanding of the [facility] Date Initiated: 03/01/2022 Target Resolution Date: 03/22/2022 Goal Status: Active Interventions: Provide education on orientation to the wound center Notes: Venous Leg Ulcer Nursing Diagnoses: Potential for venous Insuffiency (use before diagnosis confirmed) Goals: Patient will maintain optimal edema control Date Initiated: 03/01/2022 Target Resolution Date: 05/11/2022 Goal Status: Active Interventions: Provide education on venous insufficiency Treatment Activities: Therapeutic compression applied : 03/01/2022 Notes: Wound/Skin Impairment Nursing Diagnoses: Knowledge deficit related to ulceration/compromised skin integrity Goals: Ulcer/skin breakdown will have a volume reduction of 30% by week 4 Date Initiated: 03/01/2022 Date Inactivated: 03/31/2022 Target Resolution Date: 03/29/2022 Goal Status: Met Ulcer/skin breakdown will have a volume reduction of 50% by week 8 Date Initiated: 03/31/2022 Target Resolution Date: 05/12/2022 Goal Status: Active Interventions: Assess ulceration(s) every visit Treatment Activities: Topical wound management initiated : 03/01/2022 Notes: [name],  [name] ([id number]) [phone].pdf Page 7 of  14 Electronic Signature(s) Signed: 04/25/2022 9:35:43 AM By: Blanche East RN Entered By: Blanche East on 04/25/2022 09:35:43 -------------------------------------------------------------------------------- Pain Assessment Details Patient Name: Date of Service: [name], [name] 04/25/2022 8:30 A M Medical Record Number: [id number] Patient Account Number: [id number] Date of Birth/Sex: Treating RN: 1931/10/08 (89 y.o. Marta Lamas Primary Care Shenice Dolder: Deland Pretty Other Clinician: Referring Essie Lagunes: Treating Naviyah Schaffert/Extender: Raylene Everts in Treatment: 7 Active Problems Location of Pain Severity and Description of Pain Patient Has Paino No Site Locations Rate the pain. Current Pain Level: 0 Pain Management and Medication Current Pain Management: Electronic Signature(s) Signed: 04/25/2022 3:33:53 PM By: Blanche East RN Entered By: Blanche East on 04/25/2022 08:40:40 -------------------------------------------------------------------------------- Patient/Caregiver Education Details Patient Name: Date of Service: [name], [name] 1/9/2024andnbsp8:30 A M Medical Record Number: [id number] Patient Account Number: [id number] Date of Birth/Gender: Treating RN: Feb 04, 1932 (89 y.o. Marta Lamas Primary Care Physician: Deland Pretty Other Clinician: Referring Physician: Treating Physician/Extender: Raylene Everts in Treatment: 7 Education Assessment Education Provided To: Patient ROSI, SECRIST ([id number]) [id number] Page 8 of 14 Education Topics Provided Wound Debridement: Methods: Explain/Verbal Responses: Reinforcements needed, State content correctly Wound/Skin Impairment: Methods: Explain/Verbal Responses: Reinforcements needed, State content correctly Electronic Signature(s) Signed: 04/25/2022 3:33:53 PM By: Blanche East RN Entered By: Blanche East on 04/25/2022  09:36:00 -------------------------------------------------------------------------------- Wound Assessment Details Patient Name: Date of Service: [name], [name]. 04/25/2022 8:30 A M Medical Record Number: [id number] Patient Account Number: [id number] Date of Birth/Sex: Treating RN: Aug 13, 1931 (89 y.o. [name], [name] Primary Care Japleen Tornow: Deland Pretty Other Clinician: Referring Saudia Smyser: Treating Vaidehi Braddy/Extender: Raylene Everts in Treatment: 7 Wound Status Wound Number: 2 Primary Etiology: Venous Leg Ulcer Wound Location: Left, Posterior, Superior Lower Leg Wound Status: Open Wounding Event: Skin Tear/Laceration Comorbid History: Hypertension, Dementia Date Acquired: 02/04/2022 Weeks Of Treatment: 7 Clustered Wound: No Wound Measurements Length: (cm) 0.1 Width: (cm) 0.1 Depth: (cm) 0.1 Area: (cm) 0.008 Volume: (cm) 0.001 % Reduction in Area: 99.9% % Reduction in Volume: 99.9% Epithelialization: Small (1-33%) Tunneling: No Undermining: No Wound Description Classification: Full Thickness Without Exposed Support Structures Exudate Amount: None Present Foul Odor After Cleansing: No Slough/Fibrino Yes Wound Bed Granulation Amount: Large (67-100%) Necrotic Amount: None Present (0%) Periwound Skin Texture Texture Color No Abnormalities Noted: Yes No Abnormalities Noted: Yes Moisture Temperature / Pain No Abnormalities Noted: No Temperature: No Abnormality Dry / Scaly: No Maceration: No Treatment Notes Wound #2 (Lower Leg) Wound Laterality: Left, Posterior, Superior Cleanser Soap and Water Discharge Instruction: May shower and wash wound with dial antibacterial soap and water prior to dressing change. Peri-Wound Care MADOLIN, TWADDLE ([id number]) [id number] Page 9 of 14 Sween Lotion (Moisturizing lotion) Discharge Instruction: Apply moisturizing lotion as directed Topical Primary Dressing Secondary  Dressing Zetuvit Plus Silicone Border Dressing 4x4 (in/in) Discharge Instruction: Apply silicone border over primary dressing as directed. Secured With Compression Wrap Kerlix Roll 4.5x3.1 (in/yd) Discharge Instruction: Apply Kerlix and Coban compression as directed. Compression Stockings Add-Ons Electronic Signature(s) Signed: 04/25/2022 3:33:53 PM By: Blanche East RN Entered By: Blanche East on 04/25/2022 08:43:42 -------------------------------------------------------------------------------- Wound Assessment Details Patient Name: Date of Service: [name], [name]. 04/25/2022 8:30 A M Medical Record Number: [id number] Patient Account Number: [id number] Date of Birth/Sex: Treating RN: January 21, 1932 (89 y.o. Marta Lamas Primary Care Ulas Zuercher: Deland Pretty Other Clinician: Referring Marylon Verno: Treating Travian Kerner/Extender: Raylene Everts in Treatment: 7 Wound  Status Wound Number: 4 Primary Etiology: Cellulitis Wound Location: Right, Medial Lower Leg Wound Status: Open Wounding Event: Blister Comorbid History: Hypertension, Dementia Date Acquired: 04/18/2022 Weeks Of Treatment: 0 Clustered Wound: No Photos Wound Measurements Length: (cm) 2.5 Width: (cm) 3.8 Depth: (cm) 0.1 Area: (cm) 7.461 Volume: (cm) 0.746 % Reduction in Area: % Reduction in Volume: Tunneling: No Undermining: No Wound Description Classification: Full Thickness Without Exposed Support Structures Exudate Amount: Medium Exudate Type: Serosanguineous [name], [name] ([id number]) Exudate Color: red, brown Foul Odor After Cleansing: No Slough/Fibrino Yes [phone].pdf Page 10 of 14 Wound Bed Granulation Amount: Small (1-33%) Granulation Quality: Red, Pink Necrotic Amount: Large (67-100%) Necrotic Quality: Eschar, Adherent Slough Periwound Skin Texture Texture Color No Abnormalities Noted: Yes No Abnormalities Noted: No Rubor: Yes Moisture No  Abnormalities Noted: No Dry / Scaly: No Maceration: No Treatment Notes Wound #4 (Lower Leg) Wound Laterality: Right, Medial Cleanser Soap and Water Discharge Instruction: May shower and wash wound with dial antibacterial soap and water prior to dressing change. Peri-Wound Care Sween Lotion (Moisturizing lotion) Discharge Instruction: Apply moisturizing lotion as directed Topical Primary Dressing IODOFLEX 0.9% Cadexomer Iodine Pad 4x6 cm Discharge Instruction: Apply to wound bed as instructed Secondary Dressing Zetuvit Plus Silicone Border Dressing 4x4 (in/in) Discharge Instruction: Apply silicone border over primary dressing as directed. Secured With Elastic Bandage 4 inch (ACE bandage) Discharge Instruction: Secure with ACE bandage as directed. Compression Wrap Kerlix Roll 4.5x3.1 (in/yd) Discharge Instruction: Apply Kerlix and Coban compression as directed. Compression Stockings Add-Ons Electronic Signature(s) Signed: 04/25/2022 3:33:53 PM By: Blanche East RN Entered By: Blanche East on 04/25/2022 08:52:59 -------------------------------------------------------------------------------- Wound Assessment Details Patient Name: Date of Service: [name], [name]. 04/25/2022 8:30 A M Medical Record Number: [id number] Patient Account Number: [id number] Date of Birth/Sex: Treating RN: 03-07-1932 (89 y.o. Marta Lamas Primary Care Santino Kinsella: Deland Pretty Other Clinician: Referring Sharissa Brierley: Treating Nowell Sites/Extender: Raylene Everts in Treatment: 7 Wound Status Wound Number: 5 Primary Etiology: Venous Leg Ulcer Wound Location: Left, Lateral Lower Leg Wound Status: Open [name], [name] ([id number]) [id number].pdf Page 11 of 14 Wounding Event: Blister Comorbid History: Hypertension, Dementia Date Acquired: 04/17/2022 Weeks Of Treatment: 0 Clustered Wound: No Photos Wound Measurements Length: (cm) 3 Width: (cm) 1.6 Depth:  (cm) 0.1 Area: (cm) 3.77 Volume: (cm) 0.377 % Reduction in Area: % Reduction in Volume: Tunneling: No Undermining: No Wound Description Classification: Full Thickness Without Exposed Support Exudate Amount: None Present Structures Foul Odor After Cleansing: No Slough/Fibrino Yes Wound Bed Granulation Amount: Small (1-33%) Granulation Quality: Red Necrotic Amount: Large (67-100%) Periwound Skin Texture Texture Color No Abnormalities Noted: Yes No Abnormalities Noted: No Rubor: Yes Moisture No Abnormalities Noted: No Temperature / Pain Temperature: No Abnormality Treatment Notes Wound #5 (Lower Leg) Wound Laterality: Left, Lateral Cleanser Soap and Water Discharge Instruction: May shower and wash wound with dial antibacterial soap and water prior to dressing change. Peri-Wound Care Sween Lotion (Moisturizing lotion) Discharge Instruction: Apply moisturizing lotion as directed Topical Primary Dressing Secondary Dressing Zetuvit Plus Silicone Border Dressing 4x4 (in/in) Discharge Instruction: Apply silicone border over primary dressing as directed. Secured With Elastic Bandage 4 inch (ACE bandage) Discharge Instruction: Secure with ACE bandage as directed. Compression Wrap Kerlix Roll 4.5x3.1 (in/yd) Discharge Instruction: Apply Kerlix and Coban compression as directed. Compression Stockings Add-Ons [name], [name] ([id number]) [phone].pdf Page 12 of 14 Electronic Signature(s) Signed: 04/25/2022 3:33:53 PM By: Blanche East RN Entered By: Blanche East on 04/25/2022 09:31:05 -------------------------------------------------------------------------------- Wound Assessment Details Patient Name: Date  of Service: [name], [name] 04/25/2022 8:30 A M Medical Record Number: [id number] Patient Account Number: [id number] Date of Birth/Sex: Treating RN: 1931-04-30 (89 y.o. [name], [place] Primary Care Delpha Perko: Deland Pretty Other  Clinician: Referring Janeen Watson: Treating Shylee Durrett/Extender: Raylene Everts in Treatment: 7 Wound Status Wound Number: 5 Primary Etiology: Venous Leg Ulcer Wound Location: Left, Lateral Lower Leg Wound Status: Healed - Epithelialized Wounding Event: Hematoma Date Acquired: 04/17/2022 Weeks Of Treatment: 0 Clustered Wound: No Wound Measurements Length: (cm) Width: (cm) Depth: (cm) Area: (cm) Volume: (cm) 0 % Reduction in Area: 0 % Reduction in Volume: 0 0 0 Periwound Skin Texture Texture Color No Abnormalities Noted: No No Abnormalities Noted: No Moisture No Abnormalities Noted: No Treatment Notes Wound #5 (Lower Leg) Wound Laterality: Left, Lateral Cleanser Peri-Wound Care Topical Primary Dressing Secondary Dressing Secured With Compression Wrap Compression Stockings Add-Ons Electronic Signature(s) Signed: 04/25/2022 3:33:53 PM By: Blanche East RN Entered By: Blanche East on 04/25/2022 09:31:05 [name], [name] ([id number]) [id number].pdf Page 13 of 14 -------------------------------------------------------------------------------- Wound Assessment Details Patient Name: Date of Service: [name] 04/25/2022 8:30 A M Medical Record Number: [id number] Patient Account Number: [id number] Date of Birth/Sex: Treating RN: [birth year] (89 y.o. [name], [name] Primary Care Martie Muhlbauer: Deland Pretty Other Clinician: Referring Wenceslaus Gist: Treating Markisha Meding/Extender: Raylene Everts in Treatment: 7 Wound Status Wound Number: 6 Primary Etiology: Venous Leg Ulcer Wound Location: Left, Posterior Lower Leg Wound Status: Open Wounding Event: Blister Comorbid History: Hypertension, Dementia Date Acquired: 04/17/2022 Weeks Of Treatment: 0 Clustered Wound: No Photos Wound Measurements Length: (cm) 1 Width: (cm) 0.8 Depth: (cm) 0.1 Area: (cm) 0.628 Volume: (cm) 0.063 % Reduction in Area: %  Reduction in Volume: Tunneling: No Undermining: No Wound Description Classification: Full Thickness Without Exposed Support Exudate Amount: None Present Structures Foul Odor After Cleansing: No Slough/Fibrino Yes Wound Bed Granulation Amount: Small (1-33%) Granulation Quality: Red Necrotic Amount: Large (67-100%) Necrotic Quality: Eschar, Adherent Slough Periwound Skin Texture Texture Color No Abnormalities Noted: Yes No Abnormalities Noted: No Rubor: Yes Moisture No Abnormalities Noted: No Dry / Scaly: No Maceration: No Treatment Notes Wound #6 (Lower Leg) Wound Laterality: Left, Posterior Cleanser Soap and Water Discharge Instruction: May shower and wash wound with dial antibacterial soap and water prior to dressing change. Peri-Wound Care Sween Lotion (Moisturizing lotion) Discharge Instruction: Apply moisturizing lotion as directed Topical Primary Dressing [name], ESTELLE ([id number]) [id number] Page 14 of 14 Secondary Dressing Zetuvit Plus Silicone Border Dressing 4x4 (in/in) Discharge Instruction: Apply silicone border over primary dressing as directed. Secured With Elastic Bandage 4 inch (ACE bandage) Discharge Instruction: Secure with ACE bandage as directed. Compression Wrap Kerlix Roll 4.5x3.1 (in/yd) Discharge Instruction: Apply Kerlix and Coban compression as directed. Compression Stockings Add-Ons Electronic Signature(s) Signed: 04/25/2022 3:33:53 PM By: Blanche East RN Entered By: Blanche East on 04/25/2022 08:53:39 -------------------------------------------------------------------------------- Vitals Details Patient Name: Date of Service: [name], [name]. 04/25/2022 8:30 A M Medical Record Number: [id number] Patient Account Number: [id number] Date of Birth/Sex: Treating RN: September 25, 1931 (89 y.o. [name], [name] Primary Care Tabitha Riggins: Deland Pretty Other Clinician: Referring Novalee Horsfall: Treating Imara Standiford/Extender: Raylene Everts in Treatment: 7 Vital Signs Time Taken: 08:40 Temperature (F): 97.9 Height (in): 66 Pulse (bpm): 85 Weight (lbs): 133 Respiratory Rate (breaths/min): 18 Body Mass Index (BMI): 21.5 Blood Pressure (mmHg): 120/73 Reference Range: 80 - 120 mg / dl Electronic Signature(s) Signed: 04/25/2022 3:33:53 PM By: Blanche East RN Entered By: Blanche East on  04/25/2022 08:40:34 

## 2022-04-25 NOTE — Progress Notes (Signed)
[name], [name] ([id number]) [id number] Page 1 of 10 Visit Report for 04/25/2022 Chief Complaint Document Details Patient Name: Date of Service: [name] 04/25/2022 8:30 A M Medical Record Number: [id number] Patient Account Number: [id number] Date of Birth/Sex: Treating RN: May 31, 1931 (89 y.o. F) Primary Care Provider: Deland Pretty Other Clinician: Referring Provider: Treating Provider/Extender: Raylene Everts in Treatment: 7 Information Obtained from: Patient Chief Complaint Patient seen for complaints of Non-Healing Wounds. Electronic Signature(s) Signed: 04/25/2022 9:48:39 AM By: Fredirick Maudlin MD FACS Entered By: Fredirick Maudlin on 04/25/2022 09:48:39 -------------------------------------------------------------------------------- Debridement Details Patient Name: Date of Service: EDWA Mitchell, [name]. 04/25/2022 8:30 A M Medical Record Number: [id number] Patient Account Number: [id number] Date of Birth/Sex: Treating RN: June 26, 1931 (89 y.o. Iver Nestle, [place] Primary Care Provider: Deland Pretty Other Clinician: Referring Provider: Treating Provider/Extender: Raylene Everts in Treatment: 7 Debridement Performed for Assessment: Wound #4 Right,Medial Lower Leg Performed By: Physician Fredirick Maudlin, MD Debridement Type: Debridement Level of Consciousness (Pre-procedure): Awake and Alert Pre-procedure Verification/Time Out Yes - 09:03 Taken: Start Time: 09:04 Pain Control: Lidocaine 5% topical ointment T Area Debrided (L x W): otal 2.5 (cm) x 3.8 (cm) = 9.5 (cm) Tissue and other material debrided: Non-Viable, Slough, Slough Level: Non-Viable Tissue Debridement Description: Selective/Open Wound Instrument: Curette Bleeding: Minimum Hemostasis Achieved: Pressure Response to Treatment: Procedure was tolerated well Level of Consciousness (Post- Awake and Alert procedure): Post Debridement  Measurements of Total Wound Length: (cm) 2.5 Width: (cm) 3.8 Depth: (cm) 0.1 Volume: (cm) 0.746 Character of Wound/Ulcer Post Debridement: Requires Further Debridement Post Procedure Diagnosis Same as [name], [name] ([id number]) [id number] Page 2 of 10 Notes Scribed for Dr. Celine Ahr by Blanche East, RN Electronic Signature(s) Signed: 04/25/2022 11:56:27 AM By: Fredirick Maudlin MD FACS Signed: 04/25/2022 3:33:53 PM By: Blanche East RN Entered By: Blanche East on 04/25/2022 09:07:03 -------------------------------------------------------------------------------- HPI Details Patient Name: Date of Service: EDWA Mitchell, Cynthia [name]. 04/25/2022 8:30 A M Medical Record Number: [id number] Patient Account Number: [id number] Date of Birth/Sex: Treating RN: Sep 23, 1931 (89 y.o. F) Primary Care Provider: Deland Pretty Other Clinician: Referring Provider: Treating Provider/Extender: Raylene Everts in Treatment: 7 History of Present Illness HPI Description: ADMISSION 03/01/2022 This is an 89 year old woman with dementia, residing in a memory care facility. She has a past medical history significant for renal failure, status post kidney transplant and now off dialysis. She is on chronic immunosuppression with tacrolimus and prednisone. She was recently admitted with an extensive DVT of the left lower extremity, identified when her nursing facility staff noticed extreme swelling of her left lower leg. She was initiated on Eliquis. Vascular surgery saw her and recommended compression stockings. Unfortunately, her skin is so thin that the stockings, combined with the leg swelling, resulted in multiple wounds opening up on her left lower leg. ABI in clinic today was only 0.61. There are multiple lesions open on her leg, including a cluster of 3 just above the lateral ankle, 1 on her posterior calf, and another on the medial lower leg. They all  have thick slough and eschar present. The skin of her leg is discolored and blue. 03/07/2022: All of the wounds are cleaner today. They have slough present but the eschar has softened. Edema control is good. 12/15; 2 of her 3 wounds are closed the only 1 remaining on the left lower leg is the 1 on the posterior calf and this looks healthy and measuring smaller. We have been using  silver alginate with kerlix and Coban. Patient lives in the dementia unit at [facility] facility which is a life care facility. 04/25/2022: Since the last visit in our clinic, the patient has suffered fairly significant decline in her cognitive function. She has been made chiefly comfort care measures and DNR. She has been moved from the memory care unit to the nursing area of her living facility. She also developed an acute DVT fairly , extensive from the report, in her right leg. Her TED hose were removed out of concern for potentially breaking the clot off and causing it to mobilize. Her Eliquis was resumed and as a result, she has developed multiple small hematomas on her leg, 1 of which has ruptured. She was referred back to the wound care center to have that site managed. The paperwork from her facility describes the area as necrotic, but what I see on exam is simply clotted blood. They have been applying Xeroform and Kerlix to her legs. She has 2+ pitting edema bilaterally. Electronic Signature(s) Signed: 04/25/2022 9:51:21 AM By: Fredirick Maudlin MD FACS Entered By: Fredirick Maudlin on 04/25/2022 09:51:21 -------------------------------------------------------------------------------- Physical Exam Details Patient Name: Date of Service: EDWA Mitchell, Cynthia [name]. 04/25/2022 8:30 A M Medical Record Number: [id number] Patient Account Number: [id number] Date of Birth/Sex: Treating RN: [birth year] (89 y.o. F) Primary Care Provider: Deland Pretty Other Clinician: Referring Provider: Treating Provider/Extender: Raylene Everts in Treatment: 7 Constitutional . . . . no acute distress. Respiratory Normal work of breathing on room air. [name], [name] ([id number]) [id number] Page 3 of 10 Notes 04/25/2022: There are multiple small hematomas on both legs. The largest of these on her posteromedial right leg has ruptured and is full of old clot. She has 2+ pitting edema bilaterally. Electronic Signature(s) Signed: 04/25/2022 9:56:59 AM By: Fredirick Maudlin MD FACS Entered By: Fredirick Maudlin on 04/25/2022 09:56:58 -------------------------------------------------------------------------------- Physician Orders Details Patient Name: Date of Service: EDWA Mitchell, Cynthia [name]. 04/25/2022 8:30 A M Medical Record Number: [id number] Patient Account Number: [id number] Date of Birth/Sex: Treating RN: 10-May-1931 (89 y.o. Iver Nestle, [place] Primary Care Provider: Deland Pretty Other Clinician: Referring Provider: Treating Provider/Extender: Raylene Everts in Treatment: 7 Verbal / Phone Orders: No Diagnosis Coding ICD-10 Coding Code Description [phone] Non-pressure chronic ulcer of other part of right lower leg with fat layer exposed I82.492 Acute embolism and thrombosis of other specified deep vein of left lower extremity Dementia in other diseases classified elsewhere, unspecified severity, without behavioral disturbance, psychotic disturbance, mood F02.80 disturbance, and anxiety I82.491 Acute embolism and thrombosis of other specified deep vein of right lower extremity Follow-up Appointments Return appointment in 3 weeks. - Dr. Celine Ahr Anesthetic Wound #2 Left,Posterior,Superior Lower Leg (In clinic) Topical Lidocaine 5% applied to wound bed Bathing/ Shower/ Hygiene May shower and wash wound with soap and water. Edema Control - Lymphedema / SCD / Other Avoid standing for long periods of time. Wound Treatment Wound #2 - Lower Leg Wound  Laterality: Left, Posterior, Superior Cleanser: Soap and Water 1 x Per Day/30 Days Discharge Instructions: May shower and wash wound with dial antibacterial soap and water prior to dressing change. Peri-Wound Care: Sween Lotion (Moisturizing lotion) 1 x Per Day/30 Days Discharge Instructions: Apply moisturizing lotion as directed Secondary Dressing: Zetuvit Plus Silicone Border Dressing 4x4 (in/in) 1 x Per Day/30 Days Discharge Instructions: Apply silicone border over primary dressing as directed. Compression Wrap: Kerlix Roll 4.5x3.1 (in/yd) 1 x Per Day/30 Days Discharge Instructions: Apply Kerlix  and Coban compression as directed. Wound #4 - Lower Leg Wound Laterality: Right, Medial Cleanser: Soap and Water 1 x Per Day/30 Days Discharge Instructions: May shower and wash wound with dial antibacterial soap and water prior to dressing change. Peri-Wound Care: Sween Lotion (Moisturizing lotion) 1 x Per Day/30 Days Discharge Instructions: Apply moisturizing lotion as directed Prim Dressing: IODOFLEX 0.9% Cadexomer Iodine Pad 4x6 cm 1 x Per Day/30 Days ary Discharge Instructions: Apply to wound bed as instructed Secondary Dressing: Zetuvit Plus Silicone Border Dressing 4x4 (in/in) 1 x Per Day/30 Days [name], [name] ([id number]) [phone].pdf Page 4 of 10 Discharge Instructions: Apply silicone border over primary dressing as directed. Secured With: Elastic Bandage 4 inch (ACE bandage) 1 x Per Day/30 Days Discharge Instructions: Secure with ACE bandage as directed. Compression Wrap: Kerlix Roll 4.5x3.1 (in/yd) 1 x Per Day/30 Days Discharge Instructions: Apply Kerlix and Coban compression as directed. Wound #5 - Lower Leg Wound Laterality: Left, Lateral Cleanser: Soap and Water 1 x Per Day/30 Days Discharge Instructions: May shower and wash wound with dial antibacterial soap and water prior to dressing change. Peri-Wound Care: Sween Lotion (Moisturizing lotion) 1 x Per  Day/30 Days Discharge Instructions: Apply moisturizing lotion as directed Secondary Dressing: Zetuvit Plus Silicone Border Dressing 4x4 (in/in) 1 x Per Day/30 Days Discharge Instructions: Apply silicone border over primary dressing as directed. Secured With: Elastic Bandage 4 inch (ACE bandage) 1 x Per Day/30 Days Discharge Instructions: Secure with ACE bandage as directed. Compression Wrap: Kerlix Roll 4.5x3.1 (in/yd) 1 x Per Day/30 Days Discharge Instructions: Apply Kerlix and Coban compression as directed. Wound #6 - Lower Leg Wound Laterality: Left, Posterior Cleanser: Soap and Water 1 x Per Day/30 Days Discharge Instructions: May shower and wash wound with dial antibacterial soap and water prior to dressing change. Peri-Wound Care: Sween Lotion (Moisturizing lotion) 1 x Per Day/30 Days Discharge Instructions: Apply moisturizing lotion as directed Secondary Dressing: Zetuvit Plus Silicone Border Dressing 4x4 (in/in) 1 x Per Day/30 Days Discharge Instructions: Apply silicone border over primary dressing as directed. Secured With: Elastic Bandage 4 inch (ACE bandage) 1 x Per Day/30 Days Discharge Instructions: Secure with ACE bandage as directed. Compression Wrap: Kerlix Roll 4.5x3.1 (in/yd) 1 x Per Day/30 Days Discharge Instructions: Apply Kerlix and Coban compression as directed. Electronic Signature(s) Signed: 04/25/2022 12:56:28 PM By: Fredirick Maudlin MD FACS Signed: 04/25/2022 3:33:53 PM By: Blanche East RN Previous Signature: 04/25/2022 11:56:27 AM Version By: Fredirick Maudlin MD FACS Entered By: Blanche East on 04/25/2022 12:19:26 -------------------------------------------------------------------------------- Problem List Details Patient Name: Date of Service: EDWA Mitchell, Cynthia Lucia Estelle Mitchell. 04/25/2022 8:30 A M Medical Record Number: [id number] Patient Account Number: [id number] Date of Birth/Sex: Treating RN: 1931-12-19 (89 y.o. [name] Primary Care Provider: Deland Pretty Other  Clinician: Referring Provider: Treating Provider/Extender: Raylene Everts in Treatment: 7 Active Problems ICD-10 Encounter Code Description Active Date MDM Diagnosis [id number] Non-pressure chronic ulcer of other part of right lower leg with fat layer 04/25/2022 No Yes exposed TAMRE, CASS ([id number]) [phone].pdf Page 5 of 10 [phone] Acute embolism and thrombosis of other specified deep vein of left lower 03/01/2022 No Yes extremity F02.80 Dementia in other diseases classified elsewhere, unspecified severity, without 03/01/2022 No Yes behavioral disturbance, psychotic disturbance, mood disturbance, and anxiety I82.491 Acute embolism and thrombosis of other specified deep vein of right lower 04/25/2022 No Yes extremity Inactive Problems ICD-10 Code Description Active Date Inactive Date L97.822 Non-pressure chronic ulcer of other part of left lower leg with fat  layer exposed 03/01/2022 03/01/2022 Resolved Problems Electronic Signature(s) Signed: 04/25/2022 9:48:23 AM By: Fredirick Maudlin MD FACS Previous Signature: 04/25/2022 9:36:08 AM Version By: Blanche East RN Entered By: Fredirick Maudlin on 04/25/2022 09:48:23 -------------------------------------------------------------------------------- Progress Note Details Patient Name: Date of Service: EDWA Mitchell, Cynthia Waymond Cera. 04/25/2022 8:30 A M Medical Record Number: [id number] Patient Account Number: [id number] Date of Birth/Sex: Treating RN: 1931-12-01 (89 y.o. F) Primary Care Provider: Deland Pretty Other Clinician: Referring Provider: Treating Provider/Extender: Raylene Everts in Treatment: 7 Subjective Chief Complaint Information obtained from Patient Patient seen for complaints of Non-Healing Wounds. History of Present Illness (HPI) ADMISSION 03/01/2022 This is an 89 year old woman with dementia, residing in a memory care facility. She has a past medical history  significant for renal failure, status post kidney transplant and now off dialysis. She is on chronic immunosuppression with tacrolimus and prednisone. She was recently admitted with an extensive DVT of the left lower extremity, identified when her nursing facility staff noticed extreme swelling of her left lower leg. She was initiated on Eliquis. Vascular surgery saw her and recommended compression stockings. Unfortunately, her skin is so thin that the stockings, combined with the leg swelling, resulted in multiple wounds opening up on her left lower leg. ABI in clinic today was only 0.61. There are multiple lesions open on her leg, including a cluster of 3 just above the lateral ankle, 1 on her posterior calf, and another on the medial lower leg. They all have thick slough and eschar present. The skin of her leg is discolored and blue. 03/07/2022: All of the wounds are cleaner today. They have slough present but the eschar has softened. Edema control is good. 12/15; 2 of her 3 wounds are closed the only 1 remaining on the left lower leg is the 1 on the posterior calf and this looks healthy and measuring smaller. We have been using silver alginate with kerlix and Coban. Patient lives in the dementia unit at [facility] facility which is a life care facility. 04/25/2022: Since the last visit in our clinic, the patient has suffered fairly significant decline in her cognitive function. She has been made chiefly comfort care measures and DNR. She has been moved from the memory care unit to the nursing area of her living facility. She also developed an acute DVT fairly , extensive from the report, in her right leg. Her TED hose were removed out of concern for potentially breaking the clot off and causing it to mobilize. Her Eliquis was resumed and as a result, she has developed multiple small hematomas on her leg, 1 of which has ruptured. She was referred back to the wound care center to have that site  managed. The paperwork from her facility describes the area as necrotic, but what I see on exam is simply clotted blood. They have been applying Xeroform and Kerlix to her legs. She has 2+ pitting edema bilaterally. [name], [name] ([id number]) [id number] Page 6 of 10 Patient History Information obtained from Patient. Family History Unknown History. Social History Never smoker, Marital Status - Married, Alcohol Use - Never, Drug Use - No History, Caffeine Use - Rarely. Medical History Eyes Denies history of Cataracts, Glaucoma, Optic Neuritis Ear/Nose/Mouth/Throat Denies history of Chronic sinus problems/congestion, Middle ear problems Hematologic/Lymphatic Denies history of Anemia, Hemophilia, Human Immunodeficiency Virus, Lymphedema, Sickle Cell Disease Respiratory Denies history of Aspiration, Asthma, Chronic Obstructive Pulmonary Disease (COPD), Pneumothorax, Sleep Apnea, Tuberculosis Cardiovascular Patient has history of Hypertension Gastrointestinal Denies history of Cirrhosis ,  Colitis, Crohnoos, Hepatitis A, Hepatitis B Endocrine Denies history of Type I Diabetes, Type II Diabetes Genitourinary Denies history of End Stage Renal Disease Integumentary (Skin) Denies history of History of Burn Musculoskeletal Denies history of Gout, Rheumatoid Arthritis, Osteoarthritis, Osteomyelitis Neurologic Patient has history of Dementia Denies history of Neuropathy, Paraplegia, Seizure Disorder Psychiatric Denies history of Anorexia/bulimia, Confinement Anxiety Hospitalization/Surgery History - Orif patella right- 2015. - 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. - Breast surgery- unknown. Medical A Surgical History Notes nd Genitourinary polyneuropathy Objective Constitutional no acute distress. Vitals Time Taken: 8:40 AM, Height: 66 in, Weight: 133 lbs, BMI: 21.5, Temperature: 97.9 F, Pulse: 85 bpm, Respiratory Rate: 18 breaths/min, Blood  Pressure: 120/73 mmHg. Respiratory Normal work of breathing on room air. General Notes: 04/25/2022: There are multiple small hematomas on both legs. The largest of these on her posteromedial right leg has ruptured and is full of old clot. She has 2+ pitting edema bilaterally. Integumentary (Hair, Skin) Wound #2 status is Open. Original cause of wound was Skin T ear/Laceration. The date acquired was: 02/04/2022. The wound has been in treatment 7 weeks. The wound is located on the Left,Posterior,Superior Lower Leg. The wound measures 0.1cm length x 0.1cm width x 0.1cm depth; 0.008cm^2 area and 0.001cm^3 volume. There is no tunneling or undermining noted. There is a none present amount of drainage noted. There is large (67-100%) granulation within the wound bed. There is no necrotic tissue within the wound bed. The periwound skin appearance had no abnormalities noted for texture. The periwound skin appearance had no abnormalities noted for color. The periwound skin appearance did not exhibit: Dry/Scaly, Maceration. Periwound temperature was noted as No Abnormality. Wound #4 status is Open. Original cause of wound was Blister. The date acquired was: 04/18/2022. The wound is located on the Right,Medial Lower Leg. The wound measures 2.5cm length x 3.8cm width x 0.1cm depth; 7.461cm^2 area and 0.746cm^3 volume. There is no tunneling or undermining noted. There is a medium amount of serosanguineous drainage noted. There is small (1-33%) red, pink granulation within the wound bed. There is a large (67-100%) amount of necrotic tissue within the wound bed including Eschar and Adherent Slough. The periwound skin appearance had no abnormalities noted for texture. The periwound skin appearance exhibited: Rubor. The periwound skin appearance did not exhibit: Dry/Scaly, Maceration. Wound #5 status is Open. Original cause of wound was Blister. The date acquired was: 04/17/2022. The wound is located on the Left,Lateral  Lower Leg. The wound measures 3cm length x 1.6cm width x 0.1cm depth; 3.77cm^2 area and 0.377cm^3 volume. There is no tunneling or undermining noted. There is a none present amount of drainage noted. There is small (1-33%) red granulation within the wound bed. There is a large (67-100%) amount of necrotic tissue within the wound bed. The periwound skin appearance had no abnormalities noted for texture. The periwound skin appearance exhibited: Rubor. Periwound temperature was noted as No Abnormality. Wound #5 status is Healed - Epithelialized. Original cause of wound was Hematoma. The date acquired was: 04/17/2022. The wound is located on the Left,Lateral Lower Leg. The wound measures 0cm length x 0cm width x 0cm depth; 0cm^2 area and 0cm^3 volume. [name], [name] ([id number]) [id number] Page 7 of 10 Wound #6 status is Open. Original cause of wound was Blister. The date acquired was: 04/17/2022. The wound is located on the Left,Posterior Lower Leg. The wound measures 1cm length x 0.8cm width x 0.1cm depth; 0.628cm^2 area and 0.063cm^3 volume. There is no tunneling or undermining noted.  There is a none present amount of drainage noted. There is small (1-33%) red granulation within the wound bed. There is a large (67-100%) amount of necrotic tissue within the wound bed including Eschar and Adherent Slough. The periwound skin appearance had no abnormalities noted for texture. The periwound skin appearance exhibited: Rubor. The periwound skin appearance did not exhibit: Dry/Scaly, Maceration. Assessment Active Problems ICD-10 Non-pressure chronic ulcer of other part of right lower leg with fat layer exposed Acute embolism and thrombosis of other specified deep vein of left lower extremity Dementia in other diseases classified elsewhere, unspecified severity, without behavioral disturbance, psychotic disturbance, mood disturbance, and anxiety Acute embolism and thrombosis of  other specified deep vein of right lower extremity Procedures Wound #4 Pre-procedure diagnosis of Wound #4 is a Cellulitis located on the Right,Medial Lower Leg . There was a Selective/Open Wound Non-Viable Tissue Debridement with a total area of 9.5 sq cm performed by Fredirick Maudlin, MD. With the following instrument(s): Curette to remove Non-Viable tissue/material. Material removed includes [facility] after achieving pain control using Lidocaine 5% topical ointment. No specimens were taken. A time out was conducted at 09:03, prior to the start of the procedure. A Minimum amount of bleeding was controlled with Pressure. The procedure was tolerated well. Post Debridement Measurements: 2.5cm length x 3.8cm width x 0.1cm depth; 0.746cm^3 volume. Character of Wound/Ulcer Post Debridement requires further debridement. Post procedure Diagnosis Wound #4: Same as Pre-Procedure General Notes: Scribed for Dr. Celine Ahr by Blanche East, RN. Plan Follow-up Appointments: Return appointment in 3 weeks. - Dr. Celine Ahr Anesthetic: Wound #2 Left,Posterior,Superior Lower Leg: (In clinic) Topical Lidocaine 5% applied to wound bed Bathing/ Shower/ Hygiene: May shower and wash wound with soap and water. Edema Control - Lymphedema / SCD / Other: Avoid standing for long periods of time. WOUND #2: - Lower Leg Wound Laterality: Left, Posterior, Superior Cleanser: Soap and Water 1 x Per Day/30 Days Discharge Instructions: May shower and wash wound with dial antibacterial soap and water prior to dressing change. Peri-Wound Care: Sween Lotion (Moisturizing lotion) 1 x Per Day/30 Days Discharge Instructions: Apply moisturizing lotion as directed Secondary Dressing: Zetuvit Plus Silicone Border Dressing 4x4 (in/in) 1 x Per Day/30 Days Discharge Instructions: Apply silicone border over primary dressing as directed. Com pression Wrap: Kerlix Roll 4.5x3.1 (in/yd) 1 x Per Day/30 Days Discharge Instructions: Apply Kerlix and  Coban compression as directed. WOUND #4: - Lower Leg Wound Laterality: Right, Medial Cleanser: Soap and Water 1 x Per Day/30 Days Discharge Instructions: May shower and wash wound with dial antibacterial soap and water prior to dressing change. Peri-Wound Care: Sween Lotion (Moisturizing lotion) 1 x Per Day/30 Days Discharge Instructions: Apply moisturizing lotion as directed Prim Dressing: IODOFLEX 0.9% Cadexomer Iodine Pad 4x6 cm 1 x Per Day/30 Days ary Discharge Instructions: Apply to wound bed as instructed Secondary Dressing: Zetuvit Plus Silicone Border Dressing 4x4 (in/in) 1 x Per Day/30 Days Discharge Instructions: Apply silicone border over primary dressing as directed. Secured With: Elastic Bandage 4 inch (ACE bandage) 1 x Per Day/30 Days Discharge Instructions: Secure with ACE bandage as directed. Com pression Wrap: Kerlix Roll 4.5x3.1 (in/yd) 1 x Per Day/30 Days Discharge Instructions: Apply Kerlix and Coban compression as directed. WOUND #5: - Lower Leg Wound Laterality: Left, Lateral Cleanser: Soap and Water Discharge Instructions: May shower and wash wound with dial antibacterial soap and water prior to dressing change. Peri-Wound Care: Sween Lotion (Moisturizing lotion) Discharge Instructions: Apply moisturizing lotion as directed Secondary Dressing: Zetuvit Plus Silicone Border  Dressing 4x4 (in/in) Discharge Instructions: Apply silicone border over primary dressing as directed. Secured With: Elastic Bandage 4 inch (ACE bandage) Discharge Instructions: Secure with ACE bandage as directed. Com pression Wrap: Kerlix Roll 4.5x3.1 (in/yd) Discharge Instructions: Apply Kerlix and Coban compression as directed. WOUND #6: - Lower Leg Wound Laterality: Left, Posterior Cleanser: Soap and Water 1 x Per Day/30 Days Discharge Instructions: May shower and wash wound with dial antibacterial soap and water prior to dressing change. Peri-Wound Care: Sween Lotion (Moisturizing lotion) 1 x  Per Day/30 Days [name], [name] ([id number]) [phone].pdf Page 8 of 10 Discharge Instructions: Apply moisturizing lotion as directed Secondary Dressing: Zetuvit Plus Silicone Border Dressing 4x4 (in/in) 1 x Per Day/30 Days Discharge Instructions: Apply silicone border over primary dressing as directed. Secured With: Elastic Bandage 4 inch (ACE bandage) 1 x Per Day/30 Days Discharge Instructions: Secure with ACE bandage as directed. Compression Wrap: Kerlix Roll 4.5x3.1 (in/yd) 1 x Per Day/30 Days Discharge Instructions: Apply Kerlix and Coban compression as directed. 04/25/2022: There are multiple small hematomas on both legs. The largest of these on her posteromedial right leg has ruptured and is full of old clot. She has 2+ pitting edema bilaterally. I used a curette to debride out all of the old hematoma from the wound. We will apply Iodoflex to help breakdown additional, more adherent hematoma to be debrided at her next visit. She needs some degree of compression and I anticipate that the other hematomas will likely break open between now and when I see her next. We will apply absorbent pads with Kerlix and Ace bandaging bilaterally. Follow-up in 2 to 3 weeks. Electronic Signature(s) Signed: 04/25/2022 10:01:30 AM By: Fredirick Maudlin MD FACS Entered By: Fredirick Maudlin on 04/25/2022 10:01:30 -------------------------------------------------------------------------------- HxROS Details Patient Name: Date of Service: EDWA Mitchell, Cynthia [name]. 04/25/2022 8:30 A M Medical Record Number: [id number] Patient Account Number: [id number] Date of Birth/Sex: Treating RN: [birth year] (89 y.o. F) Primary Care Provider: Deland Pretty Other Clinician: Referring Provider: Treating Provider/Extender: Raylene Everts in Treatment: 7 Information Obtained From Patient Eyes Medical History: Negative for: Cataracts; Glaucoma; Optic  Neuritis Ear/Nose/Mouth/Throat Medical History: Negative for: Chronic sinus problems/congestion; Middle ear problems Hematologic/Lymphatic Medical History: Negative for: Anemia; Hemophilia; Human Immunodeficiency Virus; Lymphedema; Sickle Cell Disease Respiratory Medical History: Negative for: Aspiration; Asthma; Chronic Obstructive Pulmonary Disease (COPD); Pneumothorax; Sleep Apnea; Tuberculosis Cardiovascular Medical History: Positive for: Hypertension Gastrointestinal Medical History: Negative for: Cirrhosis ; Colitis; Crohns; Hepatitis A; Hepatitis B Endocrine Medical History: Negative for: Type I Diabetes; Type II Diabetes [name], [name] ([id number]) [phone].pdf Page 9 of 10 Genitourinary Medical History: Negative for: End Stage Renal Disease Past Medical History Notes: polyneuropathy Integumentary (Skin) Medical History: Negative for: History of Burn Musculoskeletal Medical History: Negative for: Gout; Rheumatoid Arthritis; Osteoarthritis; Osteomyelitis Neurologic Medical History: Positive for: Dementia Negative for: Neuropathy; Paraplegia; Seizure Disorder Psychiatric Medical History: Negative for: Anorexia/bulimia; Confinement Anxiety Immunizations Pneumococcal Vaccine: Received Pneumococcal Vaccination: Yes Received Pneumococcal Vaccination On or After 60th Birthday: Yes Implantable Devices None Hospitalization / Surgery History Type of Hospitalization/Surgery Orif 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 Breast surgery- unknown Family and Social History Unknown History: Yes; Never smoker; Marital Status - Married; Alcohol Use: Never; Drug Use: No History; Caffeine Use: Rarely; Financial Concerns: No; Food, Clothing or Shelter Needs: No; Support System Lacking: No; Transportation Concerns: No Electronic Signature(s) Signed: 04/25/2022 11:56:27 AM By: Fredirick Maudlin MD FACS Entered By: Fredirick Maudlin on  04/25/2022 09:51:27 -------------------------------------------------------------------------------- SuperBill Details Patient Name: Date  of Service: EDWA Mitchell, [name] 04/25/2022 Medical Record Number: [id number] Patient Account Number: [id number] Date of Birth/Sex: Treating RN: 1932-01-28 (89 y.o. F) Primary Care Provider: Deland Pretty Other Clinician: Referring Provider: Treating Provider/Extender: Raylene Everts in Treatment: 7 Diagnosis Coding ICD-10 Codes Code Description [phone] Non-pressure chronic ulcer of other part of right lower leg with fat layer exposed I82.492 Acute embolism and thrombosis of other specified deep vein of left lower extremity MARILOUISE, DENSMORE ([id number]) [phone].pdf Page 10 of 10 Dementia in other diseases classified elsewhere, unspecified severity, without behavioral disturbance, psychotic disturbance, mood F02.80 disturbance, and anxiety I82.491 Acute embolism and thrombosis of other specified deep vein of right lower extremity Facility Procedures : CPT4 Code: [id number] Description: 41712 - DEBRIDE WOUND 1ST 20 SQ CM OR < ICD-10 Diagnosis Description H87.183 Non-pressure chronic ulcer of other part of right lower leg with fat layer expos Modifier: ed Quantity: 1 Physician Procedures : CPT4: Description Modifier Code [id number] - WC PHYS LEVEL 4 - EST PT 25 ICD-10 Diagnosis Description L97.812 Non-pressure chronic ulcer of other part of right lower leg with fat layer exposed F02.80 Dementia in other diseases classified elsewhere,  unspecified severity, without behavioral disturbance, p disturbance, mood disturbance, and anxiety I82.491 Acute embolism and thrombosis of other specified deep vein of right lower extremity I82.492 Acute embolism and thrombosis of other specified deep  vein of left lower extremity Quantity: 1 sychotic : CPT4: [id number] - WC PHYS DEBR WO ANESTH 20 SQ CM ICD-10 Diagnosis  Description L97.812 Non-pressure chronic ulcer of other part of right lower leg with fat layer exposed Quantity: 1 Electronic Signature(s) Signed: 04/25/2022 10:02:01 AM By: Fredirick Maudlin MD FACS Entered By: Fredirick Maudlin on 04/25/2022 10:02:01

## 2022-04-26 DIAGNOSIS — D849 Immunodeficiency, unspecified: Secondary | ICD-10-CM | POA: Diagnosis not present

## 2022-04-26 DIAGNOSIS — I10 Essential (primary) hypertension: Secondary | ICD-10-CM | POA: Diagnosis not present

## 2022-04-26 DIAGNOSIS — Z8619 Personal history of other infectious and parasitic diseases: Secondary | ICD-10-CM | POA: Diagnosis not present

## 2022-04-26 DIAGNOSIS — T50905A Adverse effect of unspecified drugs, medicaments and biological substances, initial encounter: Secondary | ICD-10-CM | POA: Diagnosis not present

## 2022-04-26 DIAGNOSIS — Z940 Kidney transplant status: Secondary | ICD-10-CM | POA: Diagnosis not present

## 2022-04-27 DIAGNOSIS — C4442 Squamous cell carcinoma of skin of scalp and neck: Secondary | ICD-10-CM | POA: Diagnosis not present

## 2022-05-09 DIAGNOSIS — E039 Hypothyroidism, unspecified: Secondary | ICD-10-CM | POA: Diagnosis not present

## 2022-05-12 DIAGNOSIS — F411 Generalized anxiety disorder: Secondary | ICD-10-CM | POA: Diagnosis not present

## 2022-05-16 ENCOUNTER — Encounter (HOSPITAL_BASED_OUTPATIENT_CLINIC_OR_DEPARTMENT_OTHER): Payer: Medicare PPO | Admitting: General Surgery

## 2022-05-16 DIAGNOSIS — L89892 Pressure ulcer of other site, stage 2: Secondary | ICD-10-CM | POA: Diagnosis not present

## 2022-05-16 DIAGNOSIS — I872 Venous insufficiency (chronic) (peripheral): Secondary | ICD-10-CM | POA: Diagnosis not present

## 2022-05-16 DIAGNOSIS — L97812 Non-pressure chronic ulcer of other part of right lower leg with fat layer exposed: Secondary | ICD-10-CM | POA: Diagnosis not present

## 2022-05-16 DIAGNOSIS — F0284 Dementia in other diseases classified elsewhere, unspecified severity, with anxiety: Secondary | ICD-10-CM | POA: Diagnosis not present

## 2022-05-16 DIAGNOSIS — L03115 Cellulitis of right lower limb: Secondary | ICD-10-CM | POA: Diagnosis not present

## 2022-05-16 DIAGNOSIS — L97822 Non-pressure chronic ulcer of other part of left lower leg with fat layer exposed: Secondary | ICD-10-CM | POA: Diagnosis not present

## 2022-05-16 DIAGNOSIS — I82403 Acute embolism and thrombosis of unspecified deep veins of lower extremity, bilateral: Secondary | ICD-10-CM | POA: Diagnosis not present

## 2022-05-16 NOTE — Progress Notes (Addendum)
DEVANN, CRIBB ([id number]) [id number] Page 1 of 15 Visit Report for 05/16/2022 Chief Complaint Document Details Patient Name: Date of Service: [name] 05/16/2022 11:00 A M Medical Record Number: [id number] Patient Account Number: [id number] Date of Birth/Sex: Treating RN: 11-15-1931 (89 y.o. F) Primary Care Provider: Deland Pretty Other Clinician: Referring Provider: Treating Provider/Extender: Raylene Everts in Treatment: 10 Information Obtained from: Patient Chief Complaint Patient seen for complaints of Non-Healing Wounds. Electronic Signature(s) Signed: 05/16/2022 11:36:23 AM By: Fredirick Maudlin MD FACS Entered By: Fredirick Maudlin on 05/16/2022 11:36:23 -------------------------------------------------------------------------------- Debridement Details Patient Name: Date of Service: Cynthia [name], [name]. 05/16/2022 11:00 A M Medical Record Number: [id number] Patient Account Number: [id number] Date of Birth/Sex: Treating RN: 1931-07-25 (89 y.o. F) Primary Care Provider: Deland Pretty Other Clinician: Referring Provider: Treating Provider/Extender: Raylene Everts in Treatment: 10 Debridement Performed for Assessment: Wound #2 Left,Medial Lower Leg Performed By: Physician Fredirick Maudlin, MD Debridement Type: Debridement Severity of Tissue Pre Debridement: Fat layer exposed Level of Consciousness (Pre-procedure): Awake and Alert Pre-procedure Verification/Time Out Yes - 11:35 Taken: Start Time: 11:35 Pain Control: Lidocaine 5% topical ointment T Area Debrided (L x W): otal 13 (cm) x 0.6 (cm) = 7.8 (cm) Tissue and other material debrided: Non-Viable, Slough, Slough Level: Non-Viable Tissue Debridement Description: Selective/Open Wound Instrument: Curette Bleeding: Minimum Hemostasis Achieved: Pressure End Time: 11:36 Procedural Pain: 0 Post Procedural Pain: 0 Response to Treatment:  Procedure was tolerated well Level of Consciousness (Post- Awake and Alert procedure): Post Debridement Measurements of Total Wound Length: (cm) 13 Width: (cm) 0.6 Depth: (cm) 0.1 Volume: (cm) 0.613 Character of Wound/Ulcer Post Debridement: Improved Severity of Tissue Post Debridement: Fat layer exposed Alithia, Zavaleta Priya C ([id number]) [phone].pdf Page 2 of 15 Post Procedure Diagnosis Same as Pre-procedure Notes Scribed for Dr. Celine Ahr by J.S. Electronic Signature(s) Signed: 05/16/2022 12:27:17 PM By: Fredirick Maudlin MD FACS Entered By: Fredirick Maudlin on 05/16/2022 12:27:17 -------------------------------------------------------------------------------- Debridement Details Patient Name: Date of Service: Cynthia [name], [name]. 05/16/2022 11:00 A M Medical Record Number: [id number] Patient Account Number: [id number] Date of Birth/Sex: Treating RN: [birth year] (89 y.o. F) Primary Care Provider: Deland Pretty Other Clinician: Referring Provider: Treating Provider/Extender: Raylene Everts in Treatment: 10 Debridement Performed for Assessment: Wound #4 Right,Medial Lower Leg Performed By: Physician Fredirick Maudlin, MD Debridement Type: Debridement Level of Consciousness (Pre-procedure): Awake and Alert Pre-procedure Verification/Time Out Yes - 11:35 Taken: Start Time: 11:35 Pain Control: Lidocaine 5% topical ointment T Area Debrided (L x W): otal 6.6 (cm) x 4.2 (cm) = 27.72 (cm) Tissue and other material debrided: Non-Viable, [place] Level: Non-Viable Tissue Debridement Description: Selective/Open Wound Instrument: Curette Bleeding: Minimum Hemostasis Achieved: Pressure End Time: 11:36 Procedural Pain: 0 Post Procedural Pain: 0 Response to Treatment: Procedure was tolerated well Level of Consciousness (Post- Awake and Alert procedure): Post Debridement Measurements of Total Wound Length: (cm) 6.6 Width: (cm)  4.2 Depth: (cm) 0.3 Volume: (cm) 6.531 Character of Wound/Ulcer Post Debridement: Improved Post Procedure Diagnosis Same as Pre-procedure Notes Scribed for5 Dr. Celine Ahr by J.Scotton (J.S.) Electronic Signature(s) Signed: 05/16/2022 12:27:27 PM By: Fredirick Maudlin MD FACS Entered By: Fredirick Maudlin on 05/16/2022 12:27:27 Holland, [name] ([id number]) [id number] Page 3 of 15 -------------------------------------------------------------------------------- Debridement Details Patient Name: Date of Service: [name] 05/16/2022 11:00 A M Medical Record Number: [id number] Patient Account Number: [id number] Date of Birth/Sex: Treating RN: 1931-09-18 (89 y.o. F) Primary Care Provider: Deland Pretty Other Clinician: Referring Provider:  Treating Provider/Extender: Raylene Everts in Treatment: 10 Debridement Performed for Assessment: Wound #5 Left,Lateral Lower Leg Performed By: Physician Fredirick Maudlin, MD Debridement Type: Debridement Severity of Tissue Pre Debridement: Fat layer exposed Level of Consciousness (Pre-procedure): Awake and Alert Pre-procedure Verification/Time Out Yes - 11:35 Taken: Start Time: 11:35 Pain Control: Lidocaine 5% topical ointment T Area Debrided (L x W): otal 5.5 (cm) x 2 (cm) = 11 (cm) Tissue and other material debrided: Non-Viable, Slough, Slough Level: Non-Viable Tissue Debridement Description: Selective/Open Wound Instrument: Curette Bleeding: Minimum Hemostasis Achieved: Pressure End Time: 11:36 Procedural Pain: 0 Post Procedural Pain: 0 Response to Treatment: Procedure was tolerated well Level of Consciousness (Post- Awake and Alert procedure): Post Debridement Measurements of Total Wound Length: (cm) 5.5 Width: (cm) 2 Depth: (cm) 0.1 Volume: (cm) 0.864 Character of Wound/Ulcer Post Debridement: Improved Severity of Tissue Post Debridement: Fat layer exposed Post Procedure  Diagnosis Same as Pre-procedure Notes Scribed for Dr. Celine Ahr by J.Scotton Electronic Signature(s) Signed: 05/16/2022 12:27:36 PM By: Fredirick Maudlin MD FACS Entered By: Fredirick Maudlin on 05/16/2022 12:27:36 -------------------------------------------------------------------------------- Debridement Details Patient Name: Date of Service: Cynthia [name], [name]. 05/16/2022 11:00 A M Medical Record Number: [id number] Patient Account Number: [id number] Date of Birth/Sex: Treating RN: [birth year] (89 y.o. F) Primary Care Provider: Deland Pretty Other Clinician: Referring Provider: Treating Provider/Extender: Raylene Everts in Treatment: 10 Debridement Performed for Assessment: Wound #10 Left T Third oe Performed By: Physician Fredirick Maudlin, MD Debridement Type: Debridement Level of Consciousness (Pre-procedure): Awake and Alert Pre-procedure Verification/Time Out Yes - 11:35 Taken: Start Time: 11:35 Pain Control: Lidocaine 5% topical ointment T Area Debrided (L x W): otal 0.2 (cm) x 0.2 (cm) = 0.04 (cm) Tissue and other material debrided: Non-Viable, Tomasina Keasling, Sophiamarie C ([id number]) [phone].pdf Page 4 of 15 Level: Non-Viable Tissue Debridement Description: Selective/Open Wound Instrument: Curette Bleeding: Minimum Hemostasis Achieved: Pressure End Time: 11:36 Procedural Pain: 0 Post Procedural Pain: 0 Response to Treatment: Procedure was tolerated well Level of Consciousness (Post- Awake and Alert procedure): Post Debridement Measurements of Total Wound Length: (cm) 0.2 Stage: Category/Stage II Width: (cm) 0.2 Depth: (cm) 0.1 Volume: (cm) 0.003 Character of Wound/Ulcer Post Debridement: Improved Post Procedure Diagnosis Same as Pre-procedure Notes Scribed for Dr. Celine Ahr by J.S. Electronic Signature(s) Signed: 05/16/2022 12:27:44 PM By: Fredirick Maudlin MD FACS Entered By: Fredirick Maudlin on 05/16/2022  12:27:44 -------------------------------------------------------------------------------- HPI Details Patient Name: Date of Service: Cynthia [name], EV Lucia Estelle C. 05/16/2022 11:00 A M Medical Record Number: [id number] Patient Account Number: [id number] Date of Birth/Sex: Treating RN: 1931-06-21 (89 y.o. F) Primary Care Provider: Deland Pretty Other Clinician: Referring Provider: Treating Provider/Extender: Raylene Everts in Treatment: 10 History of Present Illness HPI Description: ADMISSION 03/01/2022 This is an 89 year old woman with dementia, residing in a memory care facility. She has a past medical history significant for renal failure, status post kidney transplant and now off dialysis. She is on chronic immunosuppression with tacrolimus and prednisone. She was recently admitted with an extensive DVT of the left lower extremity, identified when her nursing facility staff noticed extreme swelling of her left lower leg. She was initiated on Eliquis. Vascular surgery saw her and recommended compression stockings. Unfortunately, her skin is so thin that the stockings, combined with the leg swelling, resulted in multiple wounds opening up on her left lower leg. ABI in clinic today was only 0.61. There are multiple lesions open on her leg, including a cluster of 3 just above the lateral ankle,  1 on her posterior calf, and another on the medial lower leg. They all have thick slough and eschar present. The skin of her leg is discolored and blue. 03/07/2022: All of the wounds are cleaner today. They have slough present but the eschar has softened. Edema control is good. 12/15; 2 of her 3 wounds are closed the only 1 remaining on the left lower leg is the 1 on the posterior calf and this looks healthy and measuring smaller. We have been using silver alginate with kerlix and Coban. Patient lives in the dementia unit at [facility] facility which is a life care facility. 04/25/2022: Since  the last visit in our clinic, the patient has suffered fairly significant decline in her cognitive function. She has been made chiefly comfort care measures and DNR. She has been moved from the memory care unit to the nursing area of her living facility. She also developed an acute DVT fairly , extensive from the report, in her right leg. Her TED hose were removed out of concern for potentially breaking the clot off and causing it to mobilize. Her Eliquis was resumed and as a result, she has developed multiple small hematomas on her leg, 1 of which has ruptured. She was referred back to the wound care center to have that site managed. The paperwork from her facility describes the area as necrotic, but what I see on exam is simply clotted blood. They have been applying Xeroform and Kerlix to her legs. She has 2+ pitting edema bilaterally. 05/16/2022: In the interval since our last visit, she has sustained multiple new wounds that appear secondary to falls. She has skin tears on her wrist, right lateral knee, left posterior knee, and left medial leg that are new. The large wound that I saw her for on January 9 has cleaned up quite nicely and is now rather superficial. Her edema is very poorly controlled. Electronic Signature(s) Signed: 05/16/2022 11:58:15 AM By: Fredirick Maudlin MD FACS [address], [place] [id number] By: Fredirick Maudlin MD FACS [id number].pdf Page 5 of 15 Signed: 05/16/2022 11:58:15 Entered By: Fredirick Maudlin on 05/16/2022 11:58:15 -------------------------------------------------------------------------------- Physical Exam Details Patient Name: Date of Service: [name] 05/16/2022 11:00 A M Medical Record Number: [id number] Patient Account Number: [id number] Date of Birth/Sex: Treating RN: 1931/12/07 (89 y.o. F) Primary Care Provider: Deland Pretty Other Clinician: Referring Provider: Treating Provider/Extender: Raylene Everts in Treatment: 10 Constitutional . . . . no acute distress. Respiratory Normal work of breathing on room air. Notes 05/16/2022: In the interval since our last visit, she has sustained multiple new wounds that appear secondary to falls. She has skin tears on her wrist, right lateral knee, left posterior knee, and left medial leg that are new. The large wound that I saw her for on January 9 has cleaned up quite nicely and is now rather superficial. Her edema is very poorly controlled. Electronic Signature(s) Signed: 05/16/2022 12:05:53 PM By: Fredirick Maudlin MD FACS Entered By: Fredirick Maudlin on 05/16/2022 12:05:53 -------------------------------------------------------------------------------- Physician Orders Details Patient Name: Date of Service: Cynthia [name], [name]. 05/16/2022 11:00 A M Medical Record Number: [id number] Patient Account Number: [id number] Date of Birth/Sex: Treating RN: Sep 12, 1931 (89 y.o. [name] Primary Care Provider: Deland Pretty Other Clinician: Referring Provider: Treating Provider/Extender: Raylene Everts in Treatment: 10 Verbal / Phone Orders: No Diagnosis Coding ICD-10 Coding Code Description [phone] Non-pressure chronic ulcer of other part of right lower leg with fat layer exposed  I82.492 Acute embolism and thrombosis of other specified deep vein of left lower extremity Dementia in other diseases classified elsewhere, unspecified severity, without behavioral disturbance, psychotic disturbance, mood F02.80 disturbance, and anxiety I82.491 Acute embolism and thrombosis of other specified deep vein of right lower extremity L97.822 Non-pressure chronic ulcer of other part of left lower leg with fat layer exposed L98.492 Non-pressure chronic ulcer of skin of other sites with fat layer exposed Follow-up Appointments ppointment in 1 week. - Dr Celine Ahr Room 3 Return A Anesthetic Wound #2 Left,Medial Lower Leg (In  clinic) Topical Lidocaine 5% applied to wound bed [address] ALYZAE, HAWKEY ([id number]) [id number] Page 6 of 15 May shower and wash wound with soap and water. Edema Control - Lymphedema / SCD / Other Avoid standing for long periods of time. Wound Treatment Wound #10 - T Third oe Wound Laterality: Left Cleanser: Soap and Water 1 x Per Day/30 Days Discharge Instructions: May shower and wash wound with dial antibacterial soap and water prior to dressing change. Peri-Wound Care: Sween Lotion (Moisturizing lotion) 1 x Per Day/30 Days Discharge Instructions: Apply moisturizing lotion as directed Prim Dressing: Sorbalgon AG Dressing, 4x4 (in/in) 1 x Per Day/30 Days ary Discharge Instructions: Apply to wound bed as instructed Compression Wrap: Kerlix Roll 4.5x3.1 (in/yd) 1 x Per Day/30 Days Discharge Instructions: Apply Kerlix and Coban compression as directed. Compression Wrap: Coban Self-Adherent Wrap 4x5 (in/yd) 1 x Per Day/30 Days Discharge Instructions: Apply over Kerlix as directed. Wound #2 - Lower Leg Wound Laterality: Left, Medial Cleanser: Soap and Water 1 x Per Day/30 Days Discharge Instructions: May shower and wash wound with dial antibacterial soap and water prior to dressing change. Peri-Wound Care: Sween Lotion (Moisturizing lotion) 1 x Per Day/30 Days Discharge Instructions: Apply moisturizing lotion as directed Prim Dressing: Sorbalgon AG Dressing, 4x4 (in/in) 1 x Per Day/30 Days ary Discharge Instructions: Apply to wound bed as instructed Compression Wrap: Kerlix Roll 4.5x3.1 (in/yd) 1 x Per Day/30 Days Discharge Instructions: Apply Kerlix and Coban compression as directed. Compression Wrap: Coban Self-Adherent Wrap 4x5 (in/yd) 1 x Per Day/30 Days Discharge Instructions: Apply over Kerlix as directed. Wound #4 - Lower Leg Wound Laterality: Right, Medial Cleanser: Soap and Water 1 x Per Day/30 Days Discharge Instructions: May  shower and wash wound with dial antibacterial soap and water prior to dressing change. Peri-Wound Care: Sween Lotion (Moisturizing lotion) 1 x Per Day/30 Days Discharge Instructions: Apply moisturizing lotion as directed Prim Dressing: Sorbalgon AG Dressing, 4x4 (in/in) 1 x Per Day/30 Days ary Discharge Instructions: Apply to wound bed as instructed Compression Wrap: Kerlix Roll 4.5x3.1 (in/yd) 1 x Per Day/30 Days Discharge Instructions: Apply Kerlix and Coban compression as directed. Compression Wrap: Coban Self-Adherent Wrap 4x5 (in/yd) 1 x Per Day/30 Days Discharge Instructions: Apply over Kerlix as directed. Wound #5 - Lower Leg Wound Laterality: Left, Lateral Cleanser: Soap and Water 1 x Per Day/30 Days Discharge Instructions: May shower and wash wound with dial antibacterial soap and water prior to dressing change. Peri-Wound Care: Sween Lotion (Moisturizing lotion) 1 x Per Day/30 Days Discharge Instructions: Apply moisturizing lotion as directed Prim Dressing: Sorbalgon AG Dressing, 4x4 (in/in) 1 x Per Day/30 Days ary Discharge Instructions: Apply to wound bed as instructed Compression Wrap: Kerlix Roll 4.5x3.1 (in/yd) 1 x Per Day/30 Days Discharge Instructions: Apply Kerlix and Coban compression as directed. Compression Wrap: Coban Self-Adherent Wrap 4x5 (in/yd) 1 x Per Day/30 Days Discharge Instructions: Apply over Kerlix as directed. Wound #6 - Lower Leg Wound  Laterality: Left, Posterior Cleanser: Soap and Water 1 x Per Day/30 Days Discharge Instructions: May shower and wash wound with dial antibacterial soap and water prior to dressing change. Peri-Wound Care: Sween Lotion (Moisturizing lotion) 1 x Per Day/30 Days SPECIAL, RANES ([id number]) [phone].pdf Page 7 of 15 Discharge Instructions: Apply moisturizing lotion as directed Prim Dressing: Sorbalgon AG Dressing, 4x4 (in/in) 1 x Per Day/30 Days ary Discharge Instructions: Apply to wound bed as  instructed Compression Wrap: Kerlix Roll 4.5x3.1 (in/yd) 1 x Per Day/30 Days Discharge Instructions: Apply Kerlix and Coban compression as directed. Compression Wrap: Coban Self-Adherent Wrap 4x5 (in/yd) 1 x Per Day/30 Days Discharge Instructions: Apply over Kerlix as directed. Wound #7 - Wrist Wound Laterality: Left Cleanser: Soap and Water 1 x Per Day/30 Days Discharge Instructions: May shower and wash wound with dial antibacterial soap and water prior to dressing change. Peri-Wound Care: Sween Lotion (Moisturizing lotion) 1 x Per Day/30 Days Discharge Instructions: Apply moisturizing lotion as directed Prim Dressing: Sorbalgon AG Dressing, 4x4 (in/in) 1 x Per Day/30 Days ary Discharge Instructions: Apply to wound bed as instructed Compression Wrap: Kerlix Roll 4.5x3.1 (in/yd) 1 x Per Day/30 Days Discharge Instructions: Apply Kerlix and Coban compression as directed. Compression Wrap: Coban Self-Adherent Wrap 4x5 (in/yd) 1 x Per Day/30 Days Discharge Instructions: Apply over Kerlix as directed. Wound #8 - Knee Wound Laterality: Right, Lateral Cleanser: Soap and Water 1 x Per Day/30 Days Discharge Instructions: May shower and wash wound with dial antibacterial soap and water prior to dressing change. Peri-Wound Care: Sween Lotion (Moisturizing lotion) 1 x Per Day/30 Days Discharge Instructions: Apply moisturizing lotion as directed Prim Dressing: Sorbalgon AG Dressing, 4x4 (in/in) 1 x Per Day/30 Days ary Discharge Instructions: Apply to wound bed as instructed Compression Wrap: Kerlix Roll 4.5x3.1 (in/yd) 1 x Per Day/30 Days Discharge Instructions: Apply Kerlix and Coban compression as directed. Compression Wrap: Coban Self-Adherent Wrap 4x5 (in/yd) 1 x Per Day/30 Days Discharge Instructions: Apply over Kerlix as directed. Wound #9 - Knee Wound Laterality: Left, Posterior Cleanser: Soap and Water 1 x Per Day/30 Days Discharge Instructions: May shower and wash wound with dial  antibacterial soap and water prior to dressing change. Peri-Wound Care: Sween Lotion (Moisturizing lotion) 1 x Per Day/30 Days Discharge Instructions: Apply moisturizing lotion as directed Prim Dressing: Sorbalgon AG Dressing, 4x4 (in/in) 1 x Per Day/30 Days ary Discharge Instructions: Apply to wound bed as instructed Compression Wrap: Kerlix Roll 4.5x3.1 (in/yd) 1 x Per Day/30 Days Discharge Instructions: Apply Kerlix and Coban compression as directed. Compression Wrap: Coban Self-Adherent Wrap 4x5 (in/yd) 1 x Per Day/30 Days Discharge Instructions: Apply over Kerlix as directed. Electronic Signature(s) Signed: 05/16/2022 12:44:07 PM By: Fredirick Maudlin MD FACS Entered By: Fredirick Maudlin on 05/16/2022 12:06:10 Problem List Details -------------------------------------------------------------------------------- Delle Reining ([id number]) [id number] Page 8 of 15 Patient Name: Date of Service: [name] 05/16/2022 11:00 A M Medical Record Number: [id number] Patient Account Number: [id number] Date of Birth/Sex: Treating RN: [birth year] (89 y.o. F) Primary Care Provider: Deland Pretty Other Clinician: Referring Provider: Treating Provider/Extender: Raylene Everts in Treatment: 10 Active Problems ICD-10 Encounter Code Description Active Date MDM Diagnosis L97.812 Non-pressure chronic ulcer of other part of right lower leg with fat layer 04/25/2022 No Yes exposed I82.492 Acute embolism and thrombosis of other specified deep vein of left lower 03/01/2022 No Yes extremity F02.80 Dementia in other diseases classified elsewhere, unspecified severity, without 03/01/2022 No Yes behavioral disturbance, psychotic disturbance, mood disturbance, and anxiety  I82.491 Acute embolism and thrombosis of other specified deep vein of right lower 04/25/2022 No Yes extremity L97.822 Non-pressure chronic ulcer of other part of left lower leg with  fat layer 05/16/2022 No Yes exposed L98.492 Non-pressure chronic ulcer of skin of other sites with fat layer exposed 05/16/2022 No Yes Inactive Problems ICD-10 Code Description Active Date Inactive Date L97.822 Non-pressure chronic ulcer of other part of left lower leg with fat layer exposed 03/01/2022 03/01/2022 Resolved Problems Electronic Signature(s) Signed: 05/16/2022 11:36:08 AM By: Fredirick Maudlin MD FACS Previous Signature: 05/16/2022 11:33:41 AM Version By: Fredirick Maudlin MD FACS Entered By: Fredirick Maudlin on 05/16/2022 11:36:08 -------------------------------------------------------------------------------- Progress Note Details Patient Name: Date of Service: Cynthia [name], [name]. 05/16/2022 11:00 A M Medical Record Number: [id number] Patient Account Number: [id number] Date of Birth/Sex: Treating RN: 1931-07-01 (89 y.o. F) Primary Care Provider: Deland Pretty Other Clinician: Referring Provider: Treating Provider/Extender: Raylene Everts in Treatment: [address] [name] ([id number]) [id number] Page 9 of 15 Chief Complaint Information obtained from Patient Patient seen for complaints of Non-Healing Wounds. History of Present Illness (HPI) ADMISSION 03/01/2022 This is an 89 year old woman with dementia, residing in a memory care facility. She has a past medical history significant for renal failure, status post kidney transplant and now off dialysis. She is on chronic immunosuppression with tacrolimus and prednisone. She was recently admitted with an extensive DVT of the left lower extremity, identified when her nursing facility staff noticed extreme swelling of her left lower leg. She was initiated on Eliquis. Vascular surgery saw her and recommended compression stockings. Unfortunately, her skin is so thin that the stockings, combined with the leg swelling, resulted in multiple wounds opening up on her left lower  leg. ABI in clinic today was only 0.61. There are multiple lesions open on her leg, including a cluster of 3 just above the lateral ankle, 1 on her posterior calf, and another on the medial lower leg. They all have thick slough and eschar present. The skin of her leg is discolored and blue. 03/07/2022: All of the wounds are cleaner today. They have slough present but the eschar has softened. Edema control is good. 12/15; 2 of her 3 wounds are closed the only 1 remaining on the left lower leg is the 1 on the posterior calf and this looks healthy and measuring smaller. We have been using silver alginate with kerlix and Coban. Patient lives in the dementia unit at [facility] facility which is a life care facility. 04/25/2022: Since the last visit in our clinic, the patient has suffered fairly significant decline in her cognitive function. She has been made chiefly comfort care measures and DNR. She has been moved from the memory care unit to the nursing area of her living facility. She also developed an acute DVT fairly , extensive from the report, in her right leg. Her TED hose were removed out of concern for potentially breaking the clot off and causing it to mobilize. Her Eliquis was resumed and as a result, she has developed multiple small hematomas on her leg, 1 of which has ruptured. She was referred back to the wound care center to have that site managed. The paperwork from her facility describes the area as necrotic, but what I see on exam is simply clotted blood. They have been applying Xeroform and Kerlix to her legs. She has 2+ pitting edema bilaterally. 05/16/2022: In the interval since our last visit, she has sustained multiple new wounds that appear  secondary to falls. She has skin tears on her wrist, right lateral knee, left posterior knee, and left medial leg that are new. The large wound that I saw her for on January 9 has cleaned up quite nicely and is now rather superficial. Her edema  is very poorly controlled. Patient History Information obtained from Patient. Family History Unknown History. Social History Never smoker, Marital Status - Married, Alcohol Use - Never, Drug Use - No History, Caffeine Use - Rarely. Medical History Eyes Denies history of Cataracts, Glaucoma, Optic Neuritis Ear/Nose/Mouth/Throat Denies history of Chronic sinus problems/congestion, Middle ear problems Hematologic/Lymphatic Denies history of Anemia, Hemophilia, Human Immunodeficiency Virus, Lymphedema, Sickle Cell Disease Respiratory Denies history of Aspiration, Asthma, Chronic Obstructive Pulmonary Disease (COPD), Pneumothorax, Sleep Apnea, Tuberculosis Cardiovascular Patient has history of Hypertension Gastrointestinal Denies history of Cirrhosis , Colitis, Crohnoos, Hepatitis A, Hepatitis B Endocrine Denies history of Type I Diabetes, Type II Diabetes Genitourinary Denies history of End Stage Renal Disease Integumentary (Skin) Denies history of History of Burn Musculoskeletal Denies history of Gout, Rheumatoid Arthritis, Osteoarthritis, Osteomyelitis Neurologic Patient has history of Dementia Denies history of Neuropathy, Paraplegia, Seizure Disorder Psychiatric Denies history of Anorexia/bulimia, Confinement Anxiety Hospitalization/Surgery History - Orif patella right- 2015. - 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. - Breast surgery- unknown. Medical A Surgical History Notes nd Genitourinary polyneuropathy Objective Constitutional no acute distress. FATUMATA, KASHANI ([id number]) [id number] Page 10 of 15 Vitals Time Taken: 11:06 AM, Height: 66 in, Weight: 133 lbs, BMI: 21.5, Temperature: 97.7 F, Pulse: 86 bpm, Respiratory Rate: 20 breaths/min, Blood Pressure: 122/69 mmHg. Respiratory Normal work of breathing on room air. General Notes: 05/16/2022: In the interval since our last visit, she has sustained multiple new wounds that appear secondary  to falls. She has skin tears on her wrist, right lateral knee, left posterior knee, and left medial leg that are new. The large wound that I saw her for on January 9 has cleaned up quite nicely and is now rather superficial. Her edema is very poorly controlled. Integumentary (Hair, Skin) Wound #10 status is Open. Original cause of wound was Blister. The date acquired was: 05/12/2022. The wound is located on the Left T Third. The wound oe measures 0.2cm length x 0.2cm width x 0.1cm depth; 0.031cm^2 area and 0.003cm^3 volume. There is Fat Layer (Subcutaneous Tissue) exposed. There is no tunneling or undermining noted. There is a medium amount of serosanguineous drainage noted. There is small (1-33%) red granulation within the wound bed. There is a large (67-100%) amount of necrotic tissue within the wound bed including Eschar. The periwound skin appearance had no abnormalities noted for texture. The periwound skin appearance had no abnormalities noted for moisture. The periwound skin appearance had no abnormalities noted for color. Periwound temperature was noted as No Abnormality. Wound #2 status is Open. Original cause of wound was Skin T ear/Laceration. The date acquired was: 02/04/2022. The wound has been in treatment 10 weeks. The wound is located on the Left,Medial Lower Leg. The wound measures 13cm length x 0.6cm width x 0.1cm depth; 6.126cm^2 area and 0.613cm^3 volume. There is no tunneling or undermining noted. There is a none present amount of drainage noted. There is large (67-100%) red, friable, hyper - granulation within the wound bed. There is a small (1-33%) amount of necrotic tissue within the wound bed including Eschar. The periwound skin appearance had no abnormalities noted for texture. The periwound skin appearance had no abnormalities noted for color. The periwound skin appearance did not exhibit: Dry/Scaly, Maceration.  Periwound temperature was noted as No Abnormality. Wound #4  status is Open. Original cause of wound was Blister. The date acquired was: 04/18/2022. The wound has been in treatment 3 weeks. The wound is located on the Right,Medial Lower Leg. The wound measures 6.6cm length x 4.2cm width x 0.3cm depth; 21.771cm^2 area and 6.531cm^3 volume. There is a medium amount of serosanguineous drainage noted. There is small (1-33%) red, pink granulation within the wound bed. There is a large (67-100%) amount of necrotic tissue within the wound bed including Eschar and Adherent Slough. The periwound skin appearance had no abnormalities noted for texture. The periwound skin appearance exhibited: Rubor. The periwound skin appearance did not exhibit: Dry/Scaly, Maceration. Wound #5 status is Open. Original cause of wound was Blister. The date acquired was: 04/17/2022. The wound has been in treatment 3 weeks. The wound is located on the Left,Lateral Lower Leg. The wound measures 5.5cm length x 2cm width x 0.1cm depth; 8.639cm^2 area and 0.864cm^3 volume. There is a none present amount of drainage noted. There is small (1-33%) red granulation within the wound bed. There is a large (67-100%) amount of necrotic tissue within the wound bed. The periwound skin appearance had no abnormalities noted for texture. The periwound skin appearance exhibited: Rubor. Periwound temperature was noted as No Abnormality. Wound #6 status is Open. Original cause of wound was Blister. The date acquired was: 04/17/2022. The wound has been in treatment 3 weeks. The wound is located on the Left,Posterior Lower Leg. The wound measures 0.5cm length x 0.5cm width x 0.1cm depth; 0.196cm^2 area and 0.02cm^3 volume. There is Fat Layer (Subcutaneous Tissue) exposed. There is no tunneling or undermining noted. There is a none present amount of drainage noted. There is small (1-33%) red granulation within the wound bed. There is a large (67-100%) amount of necrotic tissue within the wound bed including Eschar and  Adherent Slough. The periwound skin appearance had no abnormalities noted for texture. The periwound skin appearance exhibited: Rubor. The periwound skin appearance did not exhibit: Dry/Scaly, Maceration. Wound #7 status is Open. Original cause of wound was Hematoma. The date acquired was: 05/12/2022. The wound is located on the Left Wrist. The wound measures 2cm length x 1cm width x 0.1cm depth; 1.571cm^2 area and 0.157cm^3 volume. There is Fat Layer (Subcutaneous Tissue) exposed. There is no tunneling or undermining noted. There is a medium amount of serosanguineous drainage noted. There is small (1-33%) red granulation within the wound bed. There is a large (67-100%) amount of necrotic tissue within the wound bed including Adherent Slough. The periwound skin appearance had no abnormalities noted for texture. The periwound skin appearance had no abnormalities noted for moisture. The periwound skin appearance had no abnormalities noted for color. Periwound temperature was noted as No Abnormality. Wound #8 status is Open. Original cause of wound was Skin T ear/Laceration. The date acquired was: 05/12/2022. The wound is located on the Right,Lateral Knee. The wound measures 1.5cm length x 0.3cm width x 0.1cm depth; 0.353cm^2 area and 0.035cm^3 volume. There is Fat Layer (Subcutaneous Tissue) exposed. There is no tunneling or undermining noted. There is a medium amount of serosanguineous drainage noted. There is small (1-33%) pink granulation within the wound bed. There is a large (67-100%) amount of necrotic tissue within the wound bed including Eschar and Adherent Slough. The periwound skin appearance had no abnormalities noted for texture. The periwound skin appearance had no abnormalities noted for moisture. The periwound skin appearance had no abnormalities noted for color. Periwound  temperature was noted as No Abnormality. Wound #9 status is Open. Original cause of wound was Skin T ear/Laceration.  The date acquired was: 05/11/2022. The wound is located on the Left,Posterior Knee. The wound measures 6cm length x 5cm width x 0.1cm depth; 23.562cm^2 area and 2.356cm^3 volume. There is no tunneling or undermining noted. There is a medium amount of serosanguineous drainage noted. There is small (1-33%) red granulation within the wound bed. There is a large (67-100%) amount of necrotic tissue within the wound bed including Eschar and Adherent Slough. The periwound skin appearance had no abnormalities noted for texture. The periwound skin appearance had no abnormalities noted for moisture. The periwound skin appearance had no abnormalities noted for color. Periwound temperature was noted as No Abnormality. Assessment Active Problems ICD-10 Non-pressure chronic ulcer of other part of right lower leg with fat layer exposed Acute embolism and thrombosis of other specified deep vein of left lower extremity Dementia in other diseases classified elsewhere, unspecified severity, without behavioral disturbance, psychotic disturbance, mood disturbance, and anxiety Acute embolism and thrombosis of other specified deep vein of right lower extremity Non-pressure chronic ulcer of other part of left lower leg with fat layer exposed Non-pressure chronic ulcer of skin of other sites with fat layer exposed Procedures Wound #10 SARAJANE, FAMBROUGH C ([id number]) [id number] Page 11 of 15 Pre-procedure diagnosis of Wound #10 is a Pressure Ulcer located on the Left T Third . There was a Selective/Open Wound Non-Viable Tissue Debridement oe with a total area of 0.04 sq cm performed by Fredirick Maudlin, MD. With the following instrument(s): Curette to remove Non-Viable tissue/material. Material removed includes Eschar after achieving pain control using Lidocaine 5% topical ointment. No specimens were taken. A time out was conducted at 11:35, prior to the start of the procedure. A Minimum amount  of bleeding was controlled with Pressure. The procedure was tolerated well with a pain level of 0 throughout and a pain level of 0 following the procedure. Post Debridement Measurements: 0.2cm length x 0.2cm width x 0.1cm depth; 0.003cm^3 volume. Post debridement Stage noted as Category/Stage II. Character of Wound/Ulcer Post Debridement is improved. Post procedure Diagnosis Wound #10: Same as Pre-Procedure General Notes: Scribed for Dr. Celine Ahr by Lenna Sciara.S.. Wound #2 Pre-procedure diagnosis of Wound #2 is a Venous Leg Ulcer located on the Left,Medial Lower Leg .Severity of Tissue Pre Debridement is: Fat layer exposed. There was a Selective/Open Wound Non-Viable Tissue Debridement with a total area of 7.8 sq cm performed by Fredirick Maudlin, MD. With the following instrument(s): Curette to remove Non-Viable tissue/material. Material removed includes [facility] after achieving pain control using Lidocaine 5% topical ointment. No specimens were taken. A time out was conducted at 11:35, prior to the start of the procedure. A Minimum amount of bleeding was controlled with Pressure. The procedure was tolerated well with a pain level of 0 throughout and a pain level of 0 following the procedure. Post Debridement Measurements: 13cm length x 0.6cm width x 0.1cm depth; 0.613cm^3 volume. Character of Wound/Ulcer Post Debridement is improved. Severity of Tissue Post Debridement is: Fat layer exposed. Post procedure Diagnosis Wound #2: Same as Pre-Procedure General Notes: Scribed for Dr. Celine Ahr by Lenna Sciara.S.. Wound #4 Pre-procedure diagnosis of Wound #4 is a Cellulitis located on the Right,Medial Lower Leg . There was a Selective/Open Wound Non-Viable Tissue Debridement with a total area of 27.72 sq cm performed by Fredirick Maudlin, MD. With the following instrument(s): Curette to remove Non-Viable tissue/material. Material removed includes [facility] after achieving pain control using  Lidocaine 5% topical ointment. No specimens  were taken. A time out was conducted at 11:35, prior to the start of the procedure. A Minimum amount of bleeding was controlled with Pressure. The procedure was tolerated well with a pain level of 0 throughout and a pain level of 0 following the procedure. Post Debridement Measurements: 6.6cm length x 4.2cm width x 0.3cm depth; 6.531cm^3 volume. Character of Wound/Ulcer Post Debridement is improved. Post procedure Diagnosis Wound #4: Same as Pre-Procedure General Notes: Scribed for5 Dr. Celine Ahr by J.Scotton (J.S.). Wound #5 Pre-procedure diagnosis of Wound #5 is a Venous Leg Ulcer located on the Left,Lateral Lower Leg .Severity of Tissue Pre Debridement is: Fat layer exposed. There was a Selective/Open Wound Non-Viable Tissue Debridement with a total area of 11 sq cm performed by Fredirick Maudlin, MD. With the following instrument(s): Curette to remove Non-Viable tissue/material. Material removed includes [facility] after achieving pain control using Lidocaine 5% topical ointment. No specimens were taken. A time out was conducted at 11:35, prior to the start of the procedure. A Minimum amount of bleeding was controlled with Pressure. The procedure was tolerated well with a pain level of 0 throughout and a pain level of 0 following the procedure. Post Debridement Measurements: 5.5cm length x 2cm width x 0.1cm depth; 0.864cm^3 volume. Character of Wound/Ulcer Post Debridement is improved. Severity of Tissue Post Debridement is: Fat layer exposed. Post procedure Diagnosis Wound #5: Same as Pre-Procedure General Notes: Scribed for Dr. Celine Ahr by J.Scotton. Plan Follow-up Appointments: Return Appointment in 1 week. - Dr Celine Ahr Room 3 Anesthetic: Wound #2 Left,Medial Lower Leg: (In clinic) Topical Lidocaine 5% applied to wound bed Bathing/ Shower/ Hygiene: May shower and wash wound with soap and water. Edema Control - Lymphedema / SCD / Other: Avoid standing for long periods of time. WOUND #10: - T  Third Wound Laterality: Left oe Cleanser: Soap and Water 1 x Per Day/30 Days Discharge Instructions: May shower and wash wound with dial antibacterial soap and water prior to dressing change. Peri-Wound Care: Sween Lotion (Moisturizing lotion) 1 x Per Day/30 Days Discharge Instructions: Apply moisturizing lotion as directed Prim Dressing: Sorbalgon AG Dressing, 4x4 (in/in) 1 x Per Day/30 Days ary Discharge Instructions: Apply to wound bed as instructed Com pression Wrap: Kerlix Roll 4.5x3.1 (in/yd) 1 x Per Day/30 Days Discharge Instructions: Apply Kerlix and Coban compression as directed. Com pression Wrap: Coban Self-Adherent Wrap 4x5 (in/yd) 1 x Per Day/30 Days Discharge Instructions: Apply over Kerlix as directed. WOUND #2: - Lower Leg Wound Laterality: Left, Medial Cleanser: Soap and Water 1 x Per Day/30 Days Discharge Instructions: May shower and wash wound with dial antibacterial soap and water prior to dressing change. Peri-Wound Care: Sween Lotion (Moisturizing lotion) 1 x Per Day/30 Days Discharge Instructions: Apply moisturizing lotion as directed Prim Dressing: Sorbalgon AG Dressing, 4x4 (in/in) 1 x Per Day/30 Days ary Discharge Instructions: Apply to wound bed as instructed Com pression Wrap: Kerlix Roll 4.5x3.1 (in/yd) 1 x Per Day/30 Days Discharge Instructions: Apply Kerlix and Coban compression as directed. Com pression Wrap: Coban Self-Adherent Wrap 4x5 (in/yd) 1 x Per Day/30 Days Discharge Instructions: Apply over Kerlix as directed. WOUND #4: - Lower Leg Wound Laterality: Right, Medial Cleanser: Soap and Water 1 x Per Day/30 Days Discharge Instructions: May shower and wash wound with dial antibacterial soap and water prior to dressing change. Peri-Wound Care: Sween Lotion (Moisturizing lotion) 1 x Per Day/30 Days Discharge Instructions: Apply moisturizing lotion as directed Prim Dressing: Sorbalgon AG Dressing, 4x4 (in/in) 1  x Per Day/30 Days ary Discharge  Instructions: Apply to wound bed as instructed Com pression Wrap: Kerlix Roll 4.5x3.1 (in/yd) 1 x Per Day/30 Days Discharge Instructions: Apply Kerlix and Coban compression as directed. Com pression Wrap: Coban Self-Adherent Wrap 4x5 (in/yd) 1 x Per Day/30 Days Discharge Instructions: Apply over Kerlix as directed. WOUND #5: - Lower Leg Wound Laterality: Left, Lateral AMAMDA, CURBOW C ([id number]) [phone].pdf Page 12 of 15 Cleanser: Soap and Water 1 x Per Day/30 Days Discharge Instructions: May shower and wash wound with dial antibacterial soap and water prior to dressing change. Peri-Wound Care: Sween Lotion (Moisturizing lotion) 1 x Per Day/30 Days Discharge Instructions: Apply moisturizing lotion as directed Prim Dressing: Sorbalgon AG Dressing, 4x4 (in/in) 1 x Per Day/30 Days ary Discharge Instructions: Apply to wound bed as instructed Com pression Wrap: Kerlix Roll 4.5x3.1 (in/yd) 1 x Per Day/30 Days Discharge Instructions: Apply Kerlix and Coban compression as directed. Com pression Wrap: Coban Self-Adherent Wrap 4x5 (in/yd) 1 x Per Day/30 Days Discharge Instructions: Apply over Kerlix as directed. WOUND #6: - Lower Leg Wound Laterality: Left, Posterior Cleanser: Soap and Water 1 x Per Day/30 Days Discharge Instructions: May shower and wash wound with dial antibacterial soap and water prior to dressing change. Peri-Wound Care: Sween Lotion (Moisturizing lotion) 1 x Per Day/30 Days Discharge Instructions: Apply moisturizing lotion as directed Prim Dressing: Sorbalgon AG Dressing, 4x4 (in/in) 1 x Per Day/30 Days ary Discharge Instructions: Apply to wound bed as instructed Com pression Wrap: Kerlix Roll 4.5x3.1 (in/yd) 1 x Per Day/30 Days Discharge Instructions: Apply Kerlix and Coban compression as directed. Com pression Wrap: Coban Self-Adherent Wrap 4x5 (in/yd) 1 x Per Day/30 Days Discharge Instructions: Apply over Kerlix as directed. WOUND #7: - Wrist  Wound Laterality: Left Cleanser: Soap and Water 1 x Per Day/30 Days Discharge Instructions: May shower and wash wound with dial antibacterial soap and water prior to dressing change. Peri-Wound Care: Sween Lotion (Moisturizing lotion) 1 x Per Day/30 Days Discharge Instructions: Apply moisturizing lotion as directed Prim Dressing: Sorbalgon AG Dressing, 4x4 (in/in) 1 x Per Day/30 Days ary Discharge Instructions: Apply to wound bed as instructed Com pression Wrap: Kerlix Roll 4.5x3.1 (in/yd) 1 x Per Day/30 Days Discharge Instructions: Apply Kerlix and Coban compression as directed. Com pression Wrap: Coban Self-Adherent Wrap 4x5 (in/yd) 1 x Per Day/30 Days Discharge Instructions: Apply over Kerlix as directed. WOUND #8: - Knee Wound Laterality: Right, Lateral Cleanser: Soap and Water 1 x Per Day/30 Days Discharge Instructions: May shower and wash wound with dial antibacterial soap and water prior to dressing change. Peri-Wound Care: Sween Lotion (Moisturizing lotion) 1 x Per Day/30 Days Discharge Instructions: Apply moisturizing lotion as directed Prim Dressing: Sorbalgon AG Dressing, 4x4 (in/in) 1 x Per Day/30 Days ary Discharge Instructions: Apply to wound bed as instructed Com pression Wrap: Kerlix Roll 4.5x3.1 (in/yd) 1 x Per Day/30 Days Discharge Instructions: Apply Kerlix and Coban compression as directed. Com pression Wrap: Coban Self-Adherent Wrap 4x5 (in/yd) 1 x Per Day/30 Days Discharge Instructions: Apply over Kerlix as directed. WOUND #9: - Knee Wound Laterality: Left, Posterior Cleanser: Soap and Water 1 x Per Day/30 Days Discharge Instructions: May shower and wash wound with dial antibacterial soap and water prior to dressing change. Peri-Wound Care: Sween Lotion (Moisturizing lotion) 1 x Per Day/30 Days Discharge Instructions: Apply moisturizing lotion as directed Prim Dressing: Sorbalgon AG Dressing, 4x4 (in/in) 1 x Per Day/30 Days ary Discharge Instructions: Apply to  wound bed as instructed Com pression Wrap: Kerlix  Roll 4.5x3.1 (in/yd) 1 x Per Day/30 Days Discharge Instructions: Apply Kerlix and Coban compression as directed. Com pression Wrap: Coban Self-Adherent Wrap 4x5 (in/yd) 1 x Per Day/30 Days Discharge Instructions: Apply over Kerlix as directed. 05/16/2022: In the interval since our last visit, she has sustained multiple new wounds that appear secondary to falls. She has skin tears on her wrist, right lateral knee, left posterior knee, and left medial leg that are new. The large wound that I saw her for on January 9 has cleaned up quite nicely and is now rather superficial. Her edema is very poorly controlled. I used a curette to debride slough from her right medial lower leg, left medial lower leg, left lateral lower leg, and left fourth toe. We will apply silver alginate to all open sites and wrap with Kerlix and Coban, including her wrist. Follow-up in 1 week. Electronic Signature(s) Signed: 05/16/2022 12:28:00 PM By: Fredirick Maudlin MD FACS Previous Signature: 05/16/2022 12:07:02 PM Version By: Fredirick Maudlin MD FACS Entered By: Fredirick Maudlin on 05/16/2022 12:27:59 -------------------------------------------------------------------------------- HxROS Details Patient Name: Date of Service: Cynthia [name], EV Lucia Estelle C. 05/16/2022 11:00 A M Medical Record Number: [id number] Patient Account Number: [id number] Date of Birth/Sex: Treating RN: [birth year] (89 y.o. F) Primary Care Provider: Deland Pretty Other Clinician: Referring Provider: Treating Provider/Extender: Aiden, Rao, [name] ([id number]) [phone].pdf Page 13 of 15 Weeks in Treatment: 10 Information Obtained From Patient Eyes Medical History: Negative for: Cataracts; Glaucoma; Optic Neuritis Ear/Nose/Mouth/Throat Medical History: Negative for: Chronic sinus problems/congestion; Middle ear problems Hematologic/Lymphatic Medical  History: Negative for: Anemia; Hemophilia; Human Immunodeficiency Virus; Lymphedema; Sickle Cell Disease Respiratory Medical History: Negative for: Aspiration; Asthma; Chronic Obstructive Pulmonary Disease (COPD); Pneumothorax; Sleep Apnea; Tuberculosis Cardiovascular Medical History: Positive for: Hypertension Gastrointestinal Medical History: Negative for: Cirrhosis ; Colitis; Crohns; Hepatitis A; Hepatitis B Endocrine Medical History: Negative for: Type I Diabetes; Type II Diabetes Genitourinary Medical History: Negative for: End Stage Renal Disease Past Medical History Notes: polyneuropathy Integumentary (Skin) Medical History: Negative for: History of Burn Musculoskeletal Medical History: Negative for: Gout; Rheumatoid Arthritis; Osteoarthritis; Osteomyelitis Neurologic Medical History: Positive for: Dementia Negative for: Neuropathy; Paraplegia; Seizure Disorder Psychiatric Medical History: Negative for: Anorexia/bulimia; Confinement Anxiety Immunizations Pneumococcal Vaccine: Received Pneumococcal Vaccination: Yes Received Pneumococcal Vaccination On or After 60th Birthday: Yes Implantable Devices None Hospitalization / Surgery History ANYELIN, MOGLE ([id number]) [id number] Page 14 of 15 Type of Hospitalization/Surgery Orif 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 Breast surgery- unknown Family and Social History Unknown History: Yes; Never smoker; Marital Status - Married; Alcohol Use: Never; Drug Use: No History; Caffeine Use: Rarely; Financial Concerns: No; Food, Clothing or Shelter Needs: No; Support System Lacking: No; Transportation Concerns: No Electronic Signature(s) Signed: 05/16/2022 12:44:07 PM By: Fredirick Maudlin MD FACS Entered By: Fredirick Maudlin on 05/16/2022 12:05:27 -------------------------------------------------------------------------------- SuperBill Details Patient Name: Date of  Service: Cynthia [name], [name] 05/16/2022 Medical Record Number: [id number] Patient Account Number: [id number] Date of Birth/Sex: Treating RN: 27-Nov-1931 (89 y.o. F) Primary Care Provider: Deland Pretty Other Clinician: Referring Provider: Treating Provider/Extender: Raylene Everts in Treatment: 10 Diagnosis Coding ICD-10 Codes Code Description [phone] Non-pressure chronic ulcer of other part of right lower leg with fat layer exposed I82.492 Acute embolism and thrombosis of other specified deep vein of left lower extremity Dementia in other diseases classified elsewhere, unspecified severity, without behavioral disturbance, psychotic disturbance, mood F02.80 disturbance, and anxiety I82.491 Acute embolism and thrombosis of other specified deep vein of right lower  extremity [phone] Non-pressure chronic ulcer of other part of left lower leg with fat layer exposed L98.492 Non-pressure chronic ulcer of skin of other sites with fat layer exposed Facility Procedures : CPT4 Code: [id number] Description: 89211 - DEBRIDE WOUND 1ST 20 SQ CM OR < ICD-10 Diagnosis Description L97.812 Non-pressure chronic ulcer of other part of right lower leg with fat layer expos L97.822 Non-pressure chronic ulcer of other part of left lower leg with fat layer  expose L98.492 Non-pressure chronic ulcer of skin of other sites with fat layer exposed Modifier: ed d Quantity: 1 : CPT4 Code: [id number] Description: 44818 - DEBRIDE WOUND EA ADDL 20 SQ CM ICD-10 Diagnosis Description H63.149 Non-pressure chronic ulcer of other part of right lower leg with fat layer expos L97.822 Non-pressure chronic ulcer of other part of left lower leg with fat layer  expose L98.492 Non-pressure chronic ulcer of skin of other sites with fat layer exposed Modifier: ed d Quantity: 2 Physician Procedures : CPT4: Description Modifier Code [id number] - WC PHYS LEVEL 3 - EST PT 25 ICD-10 Diagnosis Description Y77.412  Non-pressure chronic ulcer of other part of right lower leg with fat layer exposed L97.822 Non-pressure chronic ulcer of other part of left  lower leg with fat layer exposed L98.492 Non-pressure chronic ulcer of skin of other sites with fat layer exposed F02.80 Dementia in other diseases classified elsewhere, unspecified severity, without behavioral disturbance, p disturbance, mood  disturbance, and anxiety Quantity: 1 sychotic : [place], CPT4: [id number] - WC PHYS DEBR WO ANESTH 20 SQ CM ICD-10 Diagnosis Description Virdell C ([id number]) [id number] L97.812 Non-pressure chronic ulcer of other part of right lower leg with fat layer exposed L97.822 Non-pressure  chronic ulcer of other part of left lower leg with fat layer exposed L98.492 Non-pressure chronic ulcer of skin of other sites with fat layer exposed Quantity: 1 df Page 15 of 15 : CPT4: [id number] - WC PHYS DEBR WO ANESTH EA ADD 20 CM 2 ICD-10 Diagnosis Description L97.812 Non-pressure chronic ulcer of other part of right lower leg with fat layer exposed L97.822 Non-pressure chronic ulcer of other part of left lower leg with  fat layer exposed L98.492 Non-pressure chronic ulcer of skin of other sites with fat layer exposed Quantity: Electronic Signature(s) Signed: 05/16/2022 12:28:31 PM By: Fredirick Maudlin MD FACS Entered By: Fredirick Maudlin on 05/16/2022 12:28:31

## 2022-05-16 NOTE — Progress Notes (Signed)
BEBE, MONCURE ([id number]) [id number] Page 1 of 18 Visit Report for 05/16/2022 Arrival Information Details Patient Name: Date of Service: [name] 05/16/2022 11:00 A M Medical Record Number: [id number] Patient Account Number: [id number] Date of Birth/Sex: Treating RN: Nov 04, 1931 (89 y.o. F) Primary Care Saniyya Gau: Deland Pretty Other Clinician: Referring Herndon Grill: Treating Jalise Zawistowski/Extender: Raylene Everts in Treatment: 10 Visit Information History Since Last Visit All ordered tests and consults were completed: No Patient Arrived: Wheel Chair Added or deleted any medications: No Arrival Time: 11:05 Any new allergies or adverse reactions: No Accompanied By: husband Had a fall or experienced change in No Transfer Assistance: Manual activities of daily living that may affect Patient Identification Verified: Yes risk of falls: Secondary Verification Process Completed: Yes Signs or symptoms of abuse/neglect since last visito No Patient Requires Transmission-Based Precautions: No Hospitalized since last visit: No Patient Has Alerts: No Implantable device outside of the clinic excluding No cellular tissue based products placed in the center since last visit: Pain Present Now: No Electronic Signature(s) Signed: 05/16/2022 12:31:32 PM By: Dellie Catholic RN Entered By: Dellie Catholic on 05/16/2022 12:31:00 -------------------------------------------------------------------------------- Encounter Discharge Information Details Patient Name: Date of Service: [name], [name]. 05/16/2022 11:00 A M Medical Record Number: [id number] Patient Account Number: [id number] Date of Birth/Sex: Treating RN: 1931-10-23 (89 y.o. [name] Primary Care Sharline Lehane: Deland Pretty Other Clinician: Referring Drianna Chandran: Treating Sherice Ijames/Extender: Raylene Everts in Treatment: 10 Encounter Discharge Information Items  Post Procedure Vitals Discharge Condition: Stable Temperature (F): 97.7 Ambulatory Status: Wheelchair Pulse (bpm): 86 Discharge Destination: [place] Respiratory Rate (breaths/min): 20 Telephoned: No Blood Pressure (mmHg): 122/69 Orders Sent: Yes Transportation: Private Auto Accompanied By: caregiver/spouse Schedule Follow-up Appointment: Yes Clinical Summary of Care: Patient Declined Electronic Signature(s) Signed: 05/16/2022 12:31:32 PM By: Dellie Catholic RN Entered By: Dellie Catholic on 05/16/2022 12:30:43 [name], [name] ([id number]) [id number] Page 2 of 18 -------------------------------------------------------------------------------- Lower Extremity Assessment Details Patient Name: Date of Service: [name] 05/16/2022 11:00 A M Medical Record Number: [id number] Patient Account Number: [id number] Date of Birth/Sex: Treating RN: June 26, 1931 (89 y.o. [name] Primary Care Ruslan Mccabe: Deland Pretty Other Clinician: Referring Kessler Solly: Treating Terrance Lanahan/Extender: Raylene Everts in Treatment: 10 Edema Assessment Assessed: Shirlyn Goltz: No] Patrice Paradise: No] [Left: Edema] [Right: :] Calf Left: Right: Point of Measurement: From Medial Instep 29.5 cm 32.5 cm Ankle Left: Right: Point of Measurement: From Medial Instep 18.4 cm 18 cm Vascular Assessment Pulses: Dorsalis Pedis Palpable: [Left:Yes] [Right:Yes] Electronic Signature(s) Signed: 05/16/2022 12:31:32 PM By: Dellie Catholic RN Entered By: Dellie Catholic on 05/16/2022 11:45:58 -------------------------------------------------------------------------------- Multi Wound Chart Details Patient Name: Date of Service: [name], [name]. 05/16/2022 11:00 A M Medical Record Number: [id number] Patient Account Number: [id number] Date of Birth/Sex: Treating RN: [birth year] (89 y.o. F) Primary Care Mahrosh Donnell: Deland Pretty Other Clinician: Referring  Jamaiyah Pyle: Treating Alaila Pillard/Extender: Raylene Everts in Treatment: 10 Vital Signs Height(in): 66 Pulse(bpm): 86 Weight(lbs): 133 Blood Pressure(mmHg): 122/69 Body Mass Index(BMI): 21.5 Temperature(F): 97.7 Respiratory Rate(breaths/min): 20 [2:Photos:] Left, Medial Lower Leg Right, Medial Lower Leg Left, Lateral Lower Leg Wound Location: Skin Tear/Laceration Blister Blister Wounding Event: [name], [name] ([id number]) [id number] Page 3 of 18 Venous Leg Ulcer Cellulitis Venous Leg Ulcer Primary Etiology: Hypertension, Dementia Hypertension, Dementia Hypertension, Dementia Comorbid History: 02/04/2022 04/18/2022 04/17/2022 Date Acquired: '10 3 3 '$ Weeks of Treatment: Open Open Open Wound Status: No No No Wound Recurrence: Yes No No Clustered  Wound: 13x0.6x0.1 6.6x4.2x0.3 5.5x2x0.1 Measurements L x W x D (cm) 6.126 21.771 8.639 A (cm) : rea 0.613 6.531 0.864 Volume (cm) : 32.30% -191.80% -129.20% % Reduction in A rea: 32.30% -775.50% -129.20% % Reduction in Volume: Full Thickness Without Exposed Full Thickness Without Exposed Full Thickness Without Exposed Classification: Support Structures Support Structures Support Structures None Present Medium None Present Exudate A mount: N/A Serosanguineous N/A Exudate Type: N/A red, brown N/A Exudate Color: Large (67-100%) Small (1-33%) Small (1-33%) Granulation A mount: Red, Hyper-granulation, Friable Red, Pink Red Granulation Quality: Small (1-33%) Large (67-100%) Large (67-100%) Necrotic A mount: Eschar Eschar, Adherent Slough N/A Necrotic Tissue: Small (1-33%) N/A N/A Epithelialization: No Abnormalities Noted No Abnormalities Noted No Abnormalities Noted Periwound Skin Texture: Maceration: No Maceration: No Periwound Skin Moisture: Dry/Scaly: No Dry/Scaly: No No Abnormalities Noted Rubor: Yes Rubor: Yes Periwound Skin Color: No Abnormality N/A No  Abnormality Temperature: Wound Number: '6 7 8 '$ Photos: No Photos Left, Posterior Lower Leg Left Wrist Right, Lateral Knee Wound Location: Blister Hematoma Skin T ear/Laceration Wounding Event: Venous Leg Ulcer Trauma, Other Trauma, Other Primary Etiology: Hypertension, Dementia Hypertension, Dementia Hypertension, Dementia Comorbid History: 04/17/2022 05/12/2022 05/12/2022 Date Acquired: 3 0 0 Weeks of Treatment: Open Open Open Wound Status: No No No Wound Recurrence: No No No Clustered Wound: 0.5x0.5x0.1 2x1x0.1 1.5x0.3x0.1 Measurements L x W x D (cm) 0.196 1.571 0.353 A (cm) : rea 0.02 0.157 0.035 Volume (cm) : 68.80% N/A N/A % Reduction in A rea: 68.30% N/A N/A % Reduction in Volume: Full Thickness Without Exposed Full Thickness Without Exposed Full Thickness Without Exposed Classification: Support Structures Support Structures Support Structures None Present Medium Medium Exudate A mount: N/A Serosanguineous Serosanguineous Exudate Type: N/A red, brown red, brown Exudate Color: Small (1-33%) Small (1-33%) Small (1-33%) Granulation Amount: Red Red Pink Granulation Quality: Large (67-100%) Large (67-100%) Large (67-100%) Necrotic Amount: Eschar, Adherent Slough Adherent [name], Adherent Slough Necrotic Tissue: Fat Layer (Subcutaneous Tissue): Yes Fat Layer (Subcutaneous Tissue): Yes Fat Layer (Subcutaneous Tissue): Yes Exposed Structures: Fascia: No Fascia: No Tendon: No Tendon: No Muscle: No Muscle: No Joint: No Joint: No Bone: No Bone: No Small (1-33%) N/A Small (1-33%) Epithelialization: No Abnormalities Noted No Abnormalities Noted No Abnormalities Noted Periwound Skin Texture: Maceration: No No Abnormalities Noted No Abnormalities Noted Periwound Skin Moisture: Dry/Scaly: No Rubor: Yes No Abnormalities Noted No Abnormalities Noted Periwound Skin Color: N/A No Abnormality No Abnormality Temperature: Wound Number: 9 N/A N/A Photos: N/A  N/A CONNELLY, SPRUELL ([id number]) [id number] Page 4 of 18 Posterior Knee N/A N/A Wound Location: Skin T ear/Laceration N/A N/A Wounding Event: Trauma, Other N/A N/A Primary Etiology: Hypertension, Dementia N/A N/A Comorbid History: 05/11/2022 N/A N/A Date Acquired: 0 N/A N/A Weeks of Treatment: Open N/A N/A Wound Status: No N/A N/A Wound Recurrence: No N/A N/A Clustered Wound: 3x0.5x0.1 N/A N/A Measurements L x W x D (cm) 1.178 N/A N/A A (cm) : rea 0.118 N/A N/A Volume (cm) : N/A N/A N/A % Reduction in A rea: N/A N/A N/A % Reduction in Volume: Full Thickness Without Exposed N/A N/A Classification: Support Structures Medium N/A N/A Exudate A mount: Serosanguineous N/A N/A Exudate Type: red, brown N/A N/A Exudate Color: Small (1-33%) N/A N/A Granulation Amount: Red N/A N/A Granulation Quality: Large (67-100%) N/A N/A Necrotic Amount: Eschar, Adherent Slough N/A N/A Necrotic Tissue: Fascia: No N/A N/A Exposed Structures: Fat Layer (Subcutaneous Tissue): No Tendon: No Muscle: No Joint: No Bone: No Small (1-33%) N/A N/A Epithelialization: No Abnormalities Noted  N/A N/A Periwound Skin Texture: No Abnormalities Noted N/A N/A Periwound Skin Moisture: No Abnormalities Noted N/A N/A Periwound Skin Color: No Abnormality N/A N/A Temperature: Treatment Notes Electronic Signature(s) Signed: 05/16/2022 11:36:13 AM By: Fredirick Maudlin MD FACS Entered By: Fredirick Maudlin on 05/16/2022 11:36:13 -------------------------------------------------------------------------------- Multi-Disciplinary Care Plan Details Patient Name: Date of Service: [name], [name]. 05/16/2022 11:00 A M Medical Record Number: [id number] Patient Account Number: [id number] Date of Birth/Sex: Treating RN: 1931-11-27 (89 y.o. [name] Primary Care Shereese Bonnie: Deland Pretty Other Clinician: Referring Jerald Villalona: Treating Kristoph Sattler/Extender: Raylene Everts in Treatment: 10 Active Inactive Orientation to the Wound Care Program Nursing Diagnoses: Knowledge deficit related to the wound healing center program Goals: Patient/caregiver will verbalize understanding of the [place] Program Date Initiated: 03/01/2022 Target Resolution Date: 05/17/2023 Goal Status: Active Interventions: Provide education on orientation to the wound center Notes: Venous Leg Ulcer PREETI, WINEGARDNER ([id number]) [phone].pdf Page 5 of 18 Nursing Diagnoses: Potential for venous Insuffiency (use before diagnosis confirmed) Goals: Patient will maintain optimal edema control Date Initiated: 03/01/2022 Target Resolution Date: 05/12/2023 Goal Status: Active Interventions: Provide education on venous insufficiency Treatment Activities: Therapeutic compression applied : 03/01/2022 Notes: Wound/Skin Impairment Nursing Diagnoses: Knowledge deficit related to ulceration/compromised skin integrity Goals: Ulcer/skin breakdown will have a volume reduction of 30% by week 4 Date Initiated: 03/01/2022 Date Inactivated: 03/31/2022 Target Resolution Date: 03/29/2022 Goal Status: Met Ulcer/skin breakdown will have a volume reduction of 50% by week 8 Date Initiated: 03/31/2022 Target Resolution Date: 05/13/2023 Goal Status: Active Interventions: Assess ulceration(s) every visit Treatment Activities: Topical wound management initiated : 03/01/2022 Notes: Electronic Signature(s) Signed: 05/16/2022 12:31:32 PM By: Dellie Catholic RN Entered By: Dellie Catholic on 05/16/2022 12:29:05 -------------------------------------------------------------------------------- Pain Assessment Details Patient Name: Date of Service: [name]. 05/16/2022 11:00 A M Medical Record Number: [id number] Patient Account Number: [id number] Date of Birth/Sex: Treating RN: 09-30-1931 (89 y.o. F) Primary Care Chassidy Layson: Deland Pretty Other Clinician: Referring Keaston Pile: Treating Tolbert Matheson/Extender: Raylene Everts in Treatment: 10 Active Problems Location of Pain Severity and Description of Pain Patient Has Paino Yes Site Locations Rate the pain. [name], [name] ([id number]) [id number] Page 6 of 18 Rate the pain. Current Pain Level: 4 Worst Pain Level: 10 Least Pain Level: 0 Tolerable Pain Level: 1 Pain Management and Medication Current Pain Management: Electronic Signature(s) Signed: 05/16/2022 1:07:36 PM By: Worthy Rancher Entered By: Worthy Rancher on 05/16/2022 11:07:17 -------------------------------------------------------------------------------- Patient/Caregiver Education Details Patient Name: Date of Service: [name], EV ELYN Mitchell. 1/30/2024andnbsp11:00 A M Medical Record Number: [id number] Patient Account Number: [id number] Date of Birth/Gender: Treating RN: [birth year] (89 y.o. [name] Primary Care Physician: Deland Pretty Other Clinician: Referring Physician: Treating Physician/Extender: Raylene Everts in Treatment: 10 Education Assessment Education Provided To: Patient Education Topics Provided Wound/Skin Impairment: Methods: Explain/Verbal Responses: Return demonstration correctly Electronic Signature(s) Signed: 05/16/2022 12:31:32 PM By: Dellie Catholic RN Entered By: Dellie Catholic on 05/16/2022 12:29:19 -------------------------------------------------------------------------------- Wound Assessment Details Patient Name: Date of Service: [name], [name] 05/16/2022 11:00 A M Medical Record Number: [id number] Patient Account Number: [id number] Date of Birth/Sex: Treating RN: 04-Jun-1931 (89 y.o. [name] Primary Care Cristian Grieves: Deland Pretty Other Clinician: Referring Faylinn Schwenn: Treating Ellamae Lybeck/Extender: Starkeisha, [place], [name] ([id number])  [phone].pdf Page 7 of 18 Weeks in Treatment: 10 Wound Status Wound Number: 10 Primary Etiology: Pressure Ulcer Wound Location: Left T Third oe Wound Status: Open Wounding Event: Blister Comorbid History:  Hypertension, Dementia Date Acquired: 05/12/2022 Weeks Of Treatment: 0 Clustered Wound: No Photos Wound Measurements Length: (cm) 0.2 Width: (cm) 0.2 Depth: (cm) 0.1 Area: (cm) 0.031 Volume: (cm) 0.003 % Reduction in Area: % Reduction in Volume: Epithelialization: Small (1-33%) Tunneling: No Undermining: No Wound Description Classification: Category/Stage II Exudate Amount: Medium Exudate Type: Serosanguineous Exudate Color: red, brown Foul Odor After Cleansing: No Slough/Fibrino No Wound Bed Granulation Amount: Small (1-33%) Exposed Structure Granulation Quality: Red Fascia Exposed: No Necrotic Amount: Large (67-100%) Fat Layer (Subcutaneous Tissue) Exposed: Yes Necrotic Quality: Eschar Tendon Exposed: No Muscle Exposed: No Joint Exposed: No Bone Exposed: No Periwound Skin Texture Texture Color No Abnormalities Noted: Yes No Abnormalities Noted: Yes Moisture Temperature / Pain No Abnormalities Noted: Yes Temperature: No Abnormality Treatment Notes Wound #10 (Toe Third) Wound Laterality: Left Cleanser Soap and Water Discharge Instruction: May shower and wash wound with dial antibacterial soap and water prior to dressing change. Peri-Wound Care Sween Lotion (Moisturizing lotion) Discharge Instruction: Apply moisturizing lotion as directed Topical Primary Dressing Sorbalgon AG Dressing, 4x4 (in/in) Discharge Instruction: Apply to wound bed as instructed Secondary Dressing Secured With YAMILETTE, GARRETSON ([id number]) [id number] Page 8 of 18 Compression Wrap Kerlix Roll 4.5x3.1 (in/yd) Discharge Instruction: Apply Kerlix and Coban compression as directed. Coban Self-Adherent Wrap 4x5 (in/yd) Discharge  Instruction: Apply over Kerlix as directed. Compression Stockings Add-Ons Electronic Signature(s) Signed: 05/16/2022 12:31:32 PM By: Dellie Catholic RN Entered By: Dellie Catholic on 05/16/2022 11:47:01 -------------------------------------------------------------------------------- Wound Assessment Details Patient Name: Date of Service: Cynthia RDSCathlean Mitchell 05/16/2022 11:00 A M Medical Record Number: [id number] Patient Account Number: [id number] Date of Birth/Sex: Treating RN: 06-16-1931 (89 y.o. [name] Primary Care Javonta Gronau: Deland Pretty Other Clinician: Referring Jandy Brackens: Treating Shenia Alan/Extender: Raylene Everts in Treatment: 10 Wound Status Wound Number: 2 Primary Etiology: Venous Leg Ulcer Wound Location: Left, Medial Lower Leg Wound Status: Open Wounding Event: Skin Tear/Laceration Comorbid History: Hypertension, Dementia Date Acquired: 02/04/2022 Weeks Of Treatment: 10 Clustered Wound: Yes Photos Wound Measurements Length: (cm) 1 Width: (cm) 0 Depth: (cm) 0 Area: (cm) Volume: (cm) 3 % Reduction in Area: 32.3% .6 % Reduction in Volume: 32.3% .1 Epithelialization: Small (1-33%) 6.126 Tunneling: No 0.613 Undermining: No Wound Description Classification: Full Thickness Without Exposed Support Exudate Amount: None Present Structures Foul Odor After Cleansing: No Slough/Fibrino Yes Wound Bed Granulation Amount: Large (67-100%) Granulation Quality: Red, Hyper-granulation, Friable Necrotic Amount: Small (1-33%) Necrotic Quality: Eschar Periwound Skin Texture Texture Color [name], [name] ([id number]) [phone].pdf Page 9 of 18 No Abnormalities Noted: Yes No Abnormalities Noted: Yes Moisture Temperature / Pain No Abnormalities Noted: No Temperature: No Abnormality Dry / Scaly: No Maceration: No Treatment Notes Wound #2 (Lower Leg) Wound Laterality: Left, Medial Cleanser Soap and Water Discharge  Instruction: May shower and wash wound with dial antibacterial soap and water prior to dressing change. Peri-Wound Care Sween Lotion (Moisturizing lotion) Discharge Instruction: Apply moisturizing lotion as directed Topical Primary Dressing Sorbalgon AG Dressing, 4x4 (in/in) Discharge Instruction: Apply to wound bed as instructed Secondary Dressing Secured With Compression Wrap Kerlix Roll 4.5x3.1 (in/yd) Discharge Instruction: Apply Kerlix and Coban compression as directed. Coban Self-Adherent Wrap 4x5 (in/yd) Discharge Instruction: Apply over Kerlix as directed. Compression Stockings Add-Ons Electronic Signature(s) Signed: 05/16/2022 12:31:32 PM By: Dellie Catholic RN Entered By: Dellie Catholic on 05/16/2022 11:28:54 -------------------------------------------------------------------------------- Wound Assessment Details Patient Name: Date of Service: [name], [name] 05/16/2022 11:00 A M Medical Record Number: [id number] Patient Account Number: [id number] Date of Birth/Sex: Treating  RN: 06/01/1931 (89 y.o. F) Primary Care Lue Sykora: Deland Pretty Other Clinician: Referring Hannibal Skalla: Treating Johnmichael Melhorn/Extender: Raylene Everts in Treatment: 10 Wound Status Wound Number: 4 Primary Etiology: Cellulitis Wound Location: Right, Medial Lower Leg Wound Status: Open Wounding Event: Blister Comorbid History: Hypertension, Dementia Date Acquired: 04/18/2022 Weeks Of Treatment: 3 Clustered Wound: No Photos [name], [name] ([id number]) [id number] Page 10 of 18 Wound Measurements Length: (cm) 6.6 Width: (cm) 4.2 Depth: (cm) 0.3 Area: (cm) 21.771 Volume: (cm) 6.531 % Reduction in Area: -191.8% % Reduction in Volume: -775.5% Wound Description Classification: Full Thickness Without Exposed Support Exudate Amount: Medium Exudate Type: Serosanguineous Exudate Color: red, brown Structures Foul Odor After Cleansing:  No Slough/Fibrino Yes Wound Bed Granulation Amount: Small (1-33%) Granulation Quality: Red, Pink Necrotic Amount: Large (67-100%) Necrotic Quality: Eschar, Adherent Slough Periwound Skin Texture Texture Color No Abnormalities Noted: Yes No Abnormalities Noted: No Rubor: Yes Moisture No Abnormalities Noted: No Dry / Scaly: No Maceration: No Treatment Notes Wound #4 (Lower Leg) Wound Laterality: Right, Medial Cleanser Soap and Water Discharge Instruction: May shower and wash wound with dial antibacterial soap and water prior to dressing change. Peri-Wound Care Sween Lotion (Moisturizing lotion) Discharge Instruction: Apply moisturizing lotion as directed Topical Primary Dressing Sorbalgon AG Dressing, 4x4 (in/in) Discharge Instruction: Apply to wound bed as instructed Secondary Dressing Secured With Compression Wrap Kerlix Roll 4.5x3.1 (in/yd) Discharge Instruction: Apply Kerlix and Coban compression as directed. Coban Self-Adherent Wrap 4x5 (in/yd) Discharge Instruction: Apply over Kerlix as directed. Compression Stockings Add-Ons Electronic Signature(s) Signed: 05/16/2022 12:31:32 PM By: Dellie Catholic RN [name], [name] [id number] By: Dellie Catholic RN [phone].pdf Page 11 of 18 Signed: 05/16/2022 12:31:32 Entered By: Dellie Catholic on 05/16/2022 11:25:04 -------------------------------------------------------------------------------- Wound Assessment Details Patient Name: Date of Service: [name] 05/16/2022 11:00 A M Medical Record Number: [id number] Patient Account Number: [id number] Date of Birth/Sex: Treating RN: [birth year] (89 y.o. F) Primary Care Yizel Canby: Deland Pretty Other Clinician: Referring Destenie Ingber: Treating Berda Shelvin/Extender: Raylene Everts in Treatment: 10 Wound Status Wound Number: 5 Primary Etiology: Venous Leg Ulcer Wound Location: Left, Lateral Lower Leg Wound Status:  Open Wounding Event: Blister Comorbid History: Hypertension, Dementia Date Acquired: 04/17/2022 Weeks Of Treatment: 3 Clustered Wound: No Photos Wound Measurements Length: (cm) 5.5 Width: (cm) 2 Depth: (cm) 0.1 Area: (cm) 8.639 Volume: (cm) 0.864 % Reduction in Area: -129.2% % Reduction in Volume: -129.2% Wound Description Classification: Full Thickness Without Exposed Support Structures Exudate Amount: None Present Foul Odor After Cleansing: No Slough/Fibrino Yes Wound Bed Granulation Amount: Small (1-33%) Granulation Quality: Red Necrotic Amount: Large (67-100%) Periwound Skin Texture Texture Color No Abnormalities Noted: Yes No Abnormalities Noted: No Rubor: Yes Moisture No Abnormalities Noted: No Temperature / Pain Temperature: No Abnormality Treatment Notes Wound #5 (Lower Leg) Wound Laterality: Left, Lateral Cleanser Soap and Water Discharge Instruction: May shower and wash wound with dial antibacterial soap and water prior to dressing change. AMANDALEE, LACAP ([id number]) [id number] Page 12 of 18 Peri-Wound Care Sween Lotion (Moisturizing lotion) Discharge Instruction: Apply moisturizing lotion as directed Topical Primary Dressing Sorbalgon AG Dressing, 4x4 (in/in) Discharge Instruction: Apply to wound bed as instructed Secondary Dressing Secured With Compression Wrap Kerlix Roll 4.5x3.1 (in/yd) Discharge Instruction: Apply Kerlix and Coban compression as directed. Coban Self-Adherent Wrap 4x5 (in/yd) Discharge Instruction: Apply over Kerlix as directed. Compression Stockings Add-Ons Electronic Signature(s) Signed: 05/16/2022 12:31:32 PM By: Dellie Catholic RN Entered By: Dellie Catholic on 05/16/2022 11:24:06 -------------------------------------------------------------------------------- Wound Assessment Details Patient Name: Date of  Service: [name], [name] 05/16/2022 11:00 A M Medical Record Number:  [id number] Patient Account Number: [id number] Date of Birth/Sex: Treating RN: 1931-08-25 (89 y.o. F) Primary Care Ronetta Molla: Deland Pretty Other Clinician: Referring Tuan Tippin: Treating Sruthi Maurer/Extender: Raylene Everts in Treatment: 10 Wound Status Wound Number: 6 Primary Etiology: Venous Leg Ulcer Wound Location: Left, Posterior Lower Leg Wound Status: Open Wounding Event: Blister Comorbid History: Hypertension, Dementia Date Acquired: 04/17/2022 Weeks Of Treatment: 3 Clustered Wound: No Photos Wound Measurements Length: (cm) Width: (cm) Depth: (cm) Area: (cm) Volume: (cm) 0.5 % Reduction in Area: 68.8% 0.5 % Reduction in Volume: 68.3% 0.1 Epithelialization: Small (1-33%) 0.196 Tunneling: No 0.02 Undermining: No Wound Description [place], Jalyah Mitchell ([id number]) Classification: Full Thickness Without Exposed Support Structures Exudate Amount: None Present [id number].pdf Page 13 of 18 Foul Odor After Cleansing: No Slough/Fibrino Yes Wound Bed Granulation Amount: Small (1-33%) Exposed Structure Granulation Quality: Red Fascia Exposed: No Necrotic Amount: Large (67-100%) Fat Layer (Subcutaneous Tissue) Exposed: Yes Necrotic Quality: Eschar, Adherent Slough Tendon Exposed: No Muscle Exposed: No Joint Exposed: No Bone Exposed: No Periwound Skin Texture Texture Color No Abnormalities Noted: Yes No Abnormalities Noted: No Rubor: Yes Moisture No Abnormalities Noted: No Dry / Scaly: No Maceration: No Treatment Notes Wound #6 (Lower Leg) Wound Laterality: Left, Posterior Cleanser Soap and Water Discharge Instruction: May shower and wash wound with dial antibacterial soap and water prior to dressing change. Peri-Wound Care Sween Lotion (Moisturizing lotion) Discharge Instruction: Apply moisturizing lotion as directed Topical Primary Dressing Sorbalgon AG Dressing, 4x4 (in/in) Discharge Instruction: Apply to wound bed as  instructed Secondary Dressing Secured With Compression Wrap Kerlix Roll 4.5x3.1 (in/yd) Discharge Instruction: Apply Kerlix and Coban compression as directed. Coban Self-Adherent Wrap 4x5 (in/yd) Discharge Instruction: Apply over Kerlix as directed. Compression Stockings Add-Ons Electronic Signature(s) Signed: 05/16/2022 12:31:32 PM By: Dellie Catholic RN Entered By: Dellie Catholic on 05/16/2022 11:32:06 -------------------------------------------------------------------------------- Wound Assessment Details Patient Name: Date of Service: [name], [name] 05/16/2022 11:00 A M Medical Record Number: [id number] Patient Account Number: [id number] Date of Birth/Sex: Treating RN: 14-Apr-1932 (89 y.o. [name] Primary Care Damani Kelemen: Deland Pretty Other Clinician: Referring Airis Barbee: Treating Adelie Croswell/Extender: Raylene Everts in Treatment: [place] Wound Status [name], [name] ([id number]) [id number] Page 14 of 18 Wound Number: 7 Primary Etiology: Trauma, Other Wound Location: Left Wrist Wound Status: Open Wounding Event: Hematoma Comorbid History: Hypertension, Dementia Date Acquired: 05/12/2022 Weeks Of Treatment: 0 Clustered Wound: No Photos Wound Measurements Length: (cm) 2 Width: (cm) 1 Depth: (cm) 0.1 Area: (cm) 1.571 Volume: (cm) 0.157 % Reduction in Area: % Reduction in Volume: Tunneling: No Undermining: No Wound Description Classification: Full Thickness Without Exposed Support Exudate Amount: Medium Exudate Type: Serosanguineous Exudate Color: red, brown Structures Foul Odor After Cleansing: No Slough/Fibrino No Wound Bed Granulation Amount: Small (1-33%) Exposed Structure Granulation Quality: Red Fat Layer (Subcutaneous Tissue) Exposed: Yes Necrotic Amount: Large (67-100%) Necrotic Quality: Adherent Slough Periwound Skin Texture Texture Color No Abnormalities Noted: Yes No Abnormalities Noted:  Yes Moisture Temperature / Pain No Abnormalities Noted: Yes Temperature: No Abnormality Treatment Notes Wound #7 (Wrist) Wound Laterality: Left Cleanser Soap and Water Discharge Instruction: May shower and wash wound with dial antibacterial soap and water prior to dressing change. Peri-Wound Care Sween Lotion (Moisturizing lotion) Discharge Instruction: Apply moisturizing lotion as directed Topical Primary Dressing Sorbalgon AG Dressing, 4x4 (in/in) Discharge Instruction: Apply to wound bed as instructed Secondary Dressing Secured With Compression Wrap Kerlix Roll 4.5x3.1 (in/yd) Discharge Instruction:  Apply Kerlix and Coban compression as directed. Coban Self-Adherent Wrap 4x5 (in/yd) Discharge Instruction: Apply over Kerlix as directed. [name], [name] ([id number]) [id number] Page 15 of 18 Compression Stockings Add-Ons Electronic Signature(s) Signed: 05/16/2022 12:31:32 PM By: Dellie Catholic RN Entered By: Dellie Catholic on 05/16/2022 11:39:41 -------------------------------------------------------------------------------- Wound Assessment Details Patient Name: Date of Service: Cynthia RDSCathlean Mitchell 05/16/2022 11:00 A M Medical Record Number: [id number] Patient Account Number: [id number] Date of Birth/Sex: Treating RN: 12/03/1931 (89 y.o. [name] Primary Care Parnell Spieler: Deland Pretty Other Clinician: Referring Rogelio Winbush: Treating Anthany Thornhill/Extender: Raylene Everts in Treatment: 10 Wound Status Wound Number: 8 Primary Etiology: Trauma, Other Wound Location: Right, Lateral Knee Wound Status: Open Wounding Event: Skin Tear/Laceration Comorbid History: Hypertension, Dementia Date Acquired: 05/12/2022 Weeks Of Treatment: 0 Clustered Wound: No Photos Wound Measurements Length: (cm) 1.5 Width: (cm) 0.3 Depth: (cm) 0.1 Area: (cm) 0.353 Volume: (cm) 0.035 % Reduction in Area: % Reduction in  Volume: Epithelialization: Small (1-33%) Tunneling: No Undermining: No Wound Description Classification: Full Thickness Without Exposed Sup Exudate Amount: Medium Exudate Type: Serosanguineous Exudate Color: red, brown port Structures Wound Bed Granulation Amount: Small (1-33%) Exposed Structure Granulation Quality: Pink Fascia Exposed: No Necrotic Amount: Large (67-100%) Fat Layer (Subcutaneous Tissue) Exposed: Yes Necrotic Quality: Eschar, Adherent Slough Tendon Exposed: No Muscle Exposed: No Joint Exposed: No Bone Exposed: No Periwound Skin Texture Texture Color No Abnormalities Noted: Yes No Abnormalities Noted: Yes Moisture Temperature / Pain [name], [name] ([id number]) [id number] Page 16 of 18 No Abnormalities Noted: Yes Temperature: No Abnormality Treatment Notes Wound #8 (Knee) Wound Laterality: Right, Lateral Cleanser Soap and Water Discharge Instruction: May shower and wash wound with dial antibacterial soap and water prior to dressing change. Peri-Wound Care Sween Lotion (Moisturizing lotion) Discharge Instruction: Apply moisturizing lotion as directed Topical Primary Dressing Sorbalgon AG Dressing, 4x4 (in/in) Discharge Instruction: Apply to wound bed as instructed Secondary Dressing Secured With Compression Wrap Kerlix Roll 4.5x3.1 (in/yd) Discharge Instruction: Apply Kerlix and Coban compression as directed. Coban Self-Adherent Wrap 4x5 (in/yd) Discharge Instruction: Apply over Kerlix as directed. Compression Stockings Add-Ons Electronic Signature(s) Signed: 05/16/2022 12:31:32 PM By: Dellie Catholic RN Entered By: Dellie Catholic on 05/16/2022 11:21:52 -------------------------------------------------------------------------------- Wound Assessment Details Patient Name: Date of Service: [name], [name] 05/16/2022 11:00 A M Medical Record Number: [id number] Patient Account Number: [id number] Date of Birth/Sex:  Treating RN: December 26, 1931 (89 y.o. [name] Primary Care Mauricio Dahlen: Deland Pretty Other Clinician: Referring Layci Stenglein: Treating Atlas Kuc/Extender: Raylene Everts in Treatment: 10 Wound Status Wound Number: 9 Primary Etiology: Trauma, Other Wound Location: Left, Posterior Knee Wound Status: Open Wounding Event: Skin Tear/Laceration Comorbid History: Hypertension, Dementia Date Acquired: 05/11/2022 Weeks Of Treatment: 0 Clustered Wound: No Photos TIMIKA, MUENCH ([id number]) [id number] Page 17 of 18 Wound Measurements Length: (cm) 6 Width: (cm) 5 Depth: (cm) 0.1 Area: (cm) 23.562 Volume: (cm) 2.356 % Reduction in Area: % Reduction in Volume: Epithelialization: Small (1-33%) Tunneling: No Undermining: No Wound Description Classification: Full Thickness Without Exposed Sup Exudate Amount: Medium Exudate Type: Serosanguineous Exudate Color: red, brown port Structures Wound Bed Granulation Amount: Small (1-33%) Exposed Structure Granulation Quality: Red Fascia Exposed: No Necrotic Amount: Large (67-100%) Fat Layer (Subcutaneous Tissue) Exposed: No Necrotic Quality: Eschar, Adherent Slough Tendon Exposed: No Muscle Exposed: No Joint Exposed: No Bone Exposed: No Periwound Skin Texture Texture Color No Abnormalities Noted: Yes No Abnormalities Noted: Yes Moisture Temperature / Pain No Abnormalities Noted: Yes Temperature: No Abnormality Treatment Notes Wound #9 (  Knee) Wound Laterality: Left, Posterior Cleanser Soap and Water Discharge Instruction: May shower and wash wound with dial antibacterial soap and water prior to dressing change. Peri-Wound Care Sween Lotion (Moisturizing lotion) Discharge Instruction: Apply moisturizing lotion as directed Topical Primary Dressing Sorbalgon AG Dressing, 4x4 (in/in) Discharge Instruction: Apply to wound bed as instructed Secondary Dressing Secured With Compression  Wrap Kerlix Roll 4.5x3.1 (in/yd) Discharge Instruction: Apply Kerlix and Coban compression as directed. Coban Self-Adherent Wrap 4x5 (in/yd) Discharge Instruction: Apply over Kerlix as directed. Compression Stockings Add-Ons Electronic Signature(s) CRISTALLE, ROHM ([id number]) [id number] Page 18 of 18 Signed: 05/16/2022 12:31:32 PM By: Dellie Catholic RN Entered By: Dellie Catholic on 05/16/2022 11:40:58 -------------------------------------------------------------------------------- Vitals Details Patient Name: Date of Service: [name], [name]. 05/16/2022 11:00 A M Medical Record Number: [id number] Patient Account Number: [id number] Date of Birth/Sex: Treating RN: 1932-03-05 (89 y.o. F) Primary Care Imaan Padgett: Deland Pretty Other Clinician: Referring Tori Cupps: Treating Shantel Wesely/Extender: Raylene Everts in Treatment: 10 Vital Signs Time Taken: 11:06 Temperature (F): 97.7 Height (in): 66 Pulse (bpm): 86 Weight (lbs): 133 Respiratory Rate (breaths/min): 20 Body Mass Index (BMI): 21.5 Blood Pressure (mmHg): 122/69 Reference Range: 80 - 120 mg / dl Electronic Signature(s) Signed: 05/16/2022 1:07:36 PM By: Worthy Rancher Entered By: Worthy Rancher on 05/16/2022 11:07:01

## 2022-05-17 DIAGNOSIS — Z85810 Personal history of malignant neoplasm of tongue: Secondary | ICD-10-CM | POA: Diagnosis not present

## 2022-05-17 DIAGNOSIS — S80811A Abrasion, right lower leg, initial encounter: Secondary | ICD-10-CM | POA: Diagnosis not present

## 2022-05-17 DIAGNOSIS — L570 Actinic keratosis: Secondary | ICD-10-CM | POA: Diagnosis not present

## 2022-05-18 DIAGNOSIS — F411 Generalized anxiety disorder: Secondary | ICD-10-CM | POA: Diagnosis not present

## 2022-05-23 ENCOUNTER — Encounter (HOSPITAL_BASED_OUTPATIENT_CLINIC_OR_DEPARTMENT_OTHER): Payer: Medicare PPO | Attending: General Surgery | Admitting: General Surgery

## 2022-05-23 DIAGNOSIS — L97822 Non-pressure chronic ulcer of other part of left lower leg with fat layer exposed: Secondary | ICD-10-CM | POA: Insufficient documentation

## 2022-05-23 DIAGNOSIS — L97812 Non-pressure chronic ulcer of other part of right lower leg with fat layer exposed: Secondary | ICD-10-CM | POA: Insufficient documentation

## 2022-05-23 DIAGNOSIS — Z515 Encounter for palliative care: Secondary | ICD-10-CM | POA: Insufficient documentation

## 2022-05-23 DIAGNOSIS — L89892 Pressure ulcer of other site, stage 2: Secondary | ICD-10-CM | POA: Diagnosis not present

## 2022-05-23 DIAGNOSIS — L98492 Non-pressure chronic ulcer of skin of other sites with fat layer exposed: Secondary | ICD-10-CM | POA: Diagnosis not present

## 2022-05-23 DIAGNOSIS — M7731 Calcaneal spur, right foot: Secondary | ICD-10-CM | POA: Diagnosis not present

## 2022-05-23 DIAGNOSIS — I872 Venous insufficiency (chronic) (peripheral): Secondary | ICD-10-CM | POA: Diagnosis not present

## 2022-05-23 DIAGNOSIS — Z66 Do not resuscitate: Secondary | ICD-10-CM | POA: Insufficient documentation

## 2022-05-23 DIAGNOSIS — Z86718 Personal history of other venous thrombosis and embolism: Secondary | ICD-10-CM | POA: Insufficient documentation

## 2022-05-23 DIAGNOSIS — L03115 Cellulitis of right lower limb: Secondary | ICD-10-CM | POA: Diagnosis not present

## 2022-05-24 NOTE — Progress Notes (Signed)
TILA, MILLIRONS ([id number]) [id number] Page 1 of 18 Visit Report for 05/23/2022 Arrival Information Details Patient Name: Date of Service: EDWA [name] 05/23/2022 11:00 A M Medical Record Number: [id number] Patient Account Number: [id number] Date of Birth/Sex: Treating RN: 1931/12/28 (89 y.o. F) Primary Care Cainan Trull: Deland Pretty Other Clinician: Referring Shemeka Wardle: Treating Regis Wiland/Extender: Raylene Everts in Treatment: 11 Visit Information History Since Last Visit All ordered tests and consults were completed: No Patient Arrived: Wheel Chair Added or deleted any medications: No Arrival Time: 10:55 Any new allergies or adverse reactions: No Accompanied By: self Had a fall or experienced change in No Transfer Assistance: Manual activities of daily living that may affect Patient Identification Verified: Yes risk of falls: Secondary Verification Process Completed: Yes Signs or symptoms of abuse/neglect since last visito No Patient Requires Transmission-Based Precautions: No Hospitalized since last visit: No Patient Has Alerts: No Implantable device outside of the clinic excluding No cellular tissue based products placed in the center since last visit: Pain Present Now: No Electronic Signature(s) Signed: 05/23/2022 2:15:56 PM By: Worthy Rancher Entered By: Worthy Rancher on 05/23/2022 10:55:56 -------------------------------------------------------------------------------- Encounter Discharge Information Details Patient Name: Date of Service: EDWA [name], [name]. 05/23/2022 11:00 A M Medical Record Number: [id number] Patient Account Number: [id number] Date of Birth/Sex: Treating RN: 25-Oct-1931 (88 y.o. [name] Primary Care Nur Krasinski: Deland Pretty Other Clinician: Referring Sicily Zaragoza: Treating Omarii Scalzo/Extender: Raylene Everts in Treatment: 11 Encounter Discharge Information Items Post Procedure  Vitals Discharge Condition: Stable Temperature (F): 97.7 Ambulatory Status: Wheelchair Pulse (bpm): 88 Discharge Destination: Other (Note Required) Respiratory Rate (breaths/min): 20 Telephoned: No Blood Pressure (mmHg): 123/77 Orders Sent: Yes Transportation: Private Auto Accompanied By: driver Schedule Follow-up Appointment: Yes Clinical Summary of Care: Patient Declined Electronic Signature(s) Signed: 05/23/2022 3:45:45 PM By: Dellie Catholic RN Entered By: Dellie Catholic on 05/23/2022 15:44:18 Toothman, [name] ([id number]) [id number] Page 2 of 18 -------------------------------------------------------------------------------- Lower Extremity Assessment Details Patient Name: Date of Service: EDWA [name] 05/23/2022 11:00 A M Medical Record Number: [id number] Patient Account Number: [id number] Date of Birth/Sex: Treating RN: Nov 29, 1931 (89 y.o. Elam Dutch Primary Care Ghalia Reicks: Deland Pretty Other Clinician: Referring Kendricks Reap: Treating Verma Grothaus/Extender: Raylene Everts in Treatment: 11 Edema Assessment Assessed: Shirlyn Goltz: No] Patrice Paradise: No] [Left: Edema] [Right: :] Calf Left: Right: Point of Measurement: From Medial Instep 27.5 cm 27.5 cm Ankle Left: Right: Point of Measurement: From Medial Instep 19.5 cm 20 cm Vascular Assessment Pulses: Dorsalis Pedis Palpable: [Left:No] [Right:No] Electronic Signature(s) Signed: 05/23/2022 5:05:54 PM By: Baruch Gouty RN, BSN Entered By: Baruch Gouty on 05/23/2022 11:08:53 -------------------------------------------------------------------------------- Multi Wound Chart Details Patient Name: Date of Service: EDWA [name], [name]. 05/23/2022 11:00 A M Medical Record Number: [id number] Patient Account Number: [id number] Date of Birth/Sex: Treating RN: [birth year] (89 y.o. F) Primary Care Shrihan Putt: Deland Pretty Other Clinician: Referring Levone Otten: Treating Timathy Newberry/Extender:  Raylene Everts in Treatment: 11 Vital Signs Height(in): 66 Pulse(bpm): 88 Weight(lbs): 133 Blood Pressure(mmHg): 123/77 Body Mass Index(BMI): 21.5 Temperature(F): Respiratory Rate(breaths/min): 20 [10:Photos:] Left T Third oe Left, Medial Lower Leg Right, Medial Lower Leg Wound Location: Blister Skin Tear/Laceration Blister Wounding Event: SHYLEIGH, DAUGHTRY ([id number]) [id number].pdf Page 3 of 18 Pressure Ulcer Venous Leg Ulcer Cellulitis Primary Etiology: Hypertension, Dementia Hypertension, Dementia Hypertension, Dementia Comorbid History: 05/12/2022 02/04/2022 04/18/2022 Date Acquired: '1 11 4 '$ Weeks of Treatment: Open Open Open Wound Status: No No No Wound Recurrence: No Yes No Clustered Wound: 0.7x0.8x0.1  3x0.8x0.1 2.2x2.6x0.1 Measurements L x W x D (cm) 0.44 1.885 4.492 A (cm) : rea 0.044 0.188 0.449 Volume (cm) : -1319.40% 79.20% 39.80% % Reduction in A rea: -1366.70% 79.20% 39.80% % Reduction in Volume: Category/Stage II Full Thickness Without Exposed Full Thickness Without Exposed Classification: Support Structures Support Structures Medium None Present Medium Exudate A mount: Serosanguineous N/A Serosanguineous Exudate Type: red, brown N/A red, brown Exudate Color: Small (1-33%) Large (67-100%) Small (1-33%) Granulation Amount: Red Red, Hyper-granulation, Friable Red, Pink Granulation Quality: Large (67-100%) Small (1-33%) Large (67-100%) Necrotic Amount: Eschar Eschar Eschar, Adherent Slough Necrotic Tissue: Fat Layer (Subcutaneous Tissue): Yes N/A N/A Exposed Structures: Fascia: No Tendon: No Muscle: No Joint: No Bone: No Small (1-33%) Small (1-33%) Small (1-33%) Epithelialization: No Abnormalities Noted No Abnormalities Noted No Abnormalities Noted Periwound Skin Texture: No Abnormalities Noted Maceration: No Maceration: No Periwound Skin Moisture: Dry/Scaly: No Dry/Scaly: No No  Abnormalities Noted No Abnormalities Noted Rubor: Yes Periwound Skin Color: No Abnormality No Abnormality No Abnormality Temperature: Wound Number: '5 6 7 '$ Photos: Left, Lateral Lower Leg Left, Posterior Lower Leg Left Wrist Wound Location: Blister Blister Hematoma Wounding Event: Venous Leg Ulcer Venous Leg Ulcer Trauma, Other Primary Etiology: Hypertension, Dementia Hypertension, Dementia Hypertension, Dementia Comorbid History: 04/17/2022 04/17/2022 05/12/2022 Date Acquired: '4 4 1 '$ Weeks of Treatment: Open Open Open Wound Status: No No No Wound Recurrence: No No No Clustered Wound: 3.4x1.6x0.1 0.4x0.7x0.1 1.3x1.2x0.1 Measurements L x W x D (cm) 4.273 0.22 1.225 A (cm) : rea 0.427 0.022 0.123 Volume (cm) : -13.30% 65.00% 22.00% % Reduction in A rea: -13.30% 65.10% 21.70% % Reduction in Volume: Full Thickness Without Exposed Full Thickness Without Exposed Full Thickness Without Exposed Classification: Support Structures Support Structures Support Structures None Present None Present Medium Exudate A mount: N/A N/A Serosanguineous Exudate Type: N/A N/A red, brown Exudate Color: Small (1-33%) Small (1-33%) Small (1-33%) Granulation Amount: Red Red Red Granulation Quality: Large (67-100%) Large (67-100%) Large (67-100%) Necrotic Amount: Eschar, Adherent Slough Eschar, Adherent [place] Necrotic Tissue: N/A Fat Layer (Subcutaneous Tissue): Yes Fat Layer (Subcutaneous Tissue): Yes Exposed Structures: Fascia: No Tendon: No Muscle: No Joint: No Bone: No None Small (1-33%) N/A Epithelialization: No Abnormalities Noted No Abnormalities Noted No Abnormalities Noted Periwound Skin Texture: Maceration: No No Abnormalities Noted Periwound Skin Moisture: Dry/Scaly: No Rubor: Yes Rubor: Yes No Abnormalities Noted Periwound Skin Color: No Abnormality N/A No Abnormality Temperature: Wound Number: 8 9 N/A Photos: N/A ALAYNNA, KERWOOD ([id number])  [phone].pdf Page 4 of 18 Right, Lateral Knee Left, Posterior Knee N/A Wound Location: Skin T ear/Laceration Skin T ear/Laceration N/A Wounding Event: Trauma, Other Trauma, Other N/A Primary Etiology: Hypertension, Dementia Hypertension, Dementia N/A Comorbid History: 05/12/2022 05/11/2022 N/A Date Acquired: 1 1 N/A Weeks of Treatment: Open Open N/A Wound Status: No No N/A Wound Recurrence: No No N/A Clustered Wound: 2.8x1x0.1 5.3x4x0.1 N/A Measurements L x W x D (cm) 2.199 16.65 N/A A (cm) : rea 0.22 1.665 N/A Volume (cm) : -522.90% 29.30% N/A % Reduction in A rea: -528.60% 29.30% N/A % Reduction in Volume: Full Thickness Without Exposed Full Thickness Without Exposed N/A Classification: Support Structures Support Structures Medium Medium N/A Exudate A mount: Serosanguineous Serosanguineous N/A Exudate Type: red, brown red, brown N/A Exudate Color: Small (1-33%) Small (1-33%) N/A Granulation Amount: Pink Red N/A Granulation Quality: Large (67-100%) Large (67-100%) N/A Necrotic Amount: Eschar, Adherent Slough Eschar, Adherent Slough N/A Necrotic Tissue: Fat Layer (Subcutaneous Tissue): Yes Fascia: No N/A Exposed Structures: Fascia: No Fat Layer (  Subcutaneous Tissue): No Tendon: No Tendon: No Muscle: No Muscle: No Joint: No Joint: No Bone: No Bone: No Small (1-33%) Small (1-33%) N/A Epithelialization: No Abnormalities Noted No Abnormalities Noted N/A Periwound Skin Texture: No Abnormalities Noted No Abnormalities Noted N/A Periwound Skin Moisture: No Abnormalities Noted No Abnormalities Noted N/A Periwound Skin Color: No Abnormality No Abnormality N/A Temperature: Treatment Notes Electronic Signature(s) Signed: 05/23/2022 11:55:22 AM By: Fredirick Maudlin MD FACS Entered By: Fredirick Maudlin on 05/23/2022 11:55:22 -------------------------------------------------------------------------------- Multi-Disciplinary Care Plan  Details Patient Name: Date of Service: EDWA [name], [name]. 05/23/2022 11:00 A M Medical Record Number: [id number] Patient Account Number: [id number] Date of Birth/Sex: Treating RN: 1931/10/28 (89 y.o. [name] Primary Care Bishop Vanderwerf: Deland Pretty Other Clinician: Referring Tabita Corbo: Treating Shakevia Sarris/Extender: Raylene Everts in Treatment: 11 Active Inactive Orientation to the Wound Care Program Nursing Diagnoses: Knowledge deficit related to the wound healing center program Goals: Patient/caregiver will verbalize understanding of the [place] Date Initiated: 03/01/2022 Target Resolution Date: 05/17/2023 Goal Status: Active Interventions: CERRIA, RANDHAWA ([id number]) [phone].pdf Page 5 of 18 Provide education on orientation to the wound center Notes: Venous Leg Ulcer Nursing Diagnoses: Potential for venous Insuffiency (use before diagnosis confirmed) Goals: Patient will maintain optimal edema control Date Initiated: 03/01/2022 Target Resolution Date: 05/12/2023 Goal Status: Active Interventions: Provide education on venous insufficiency Treatment Activities: Therapeutic compression applied : 03/01/2022 Notes: Wound/Skin Impairment Nursing Diagnoses: Knowledge deficit related to ulceration/compromised skin integrity Goals: Ulcer/skin breakdown will have a volume reduction of 30% by week 4 Date Initiated: 03/01/2022 Date Inactivated: 03/31/2022 Target Resolution Date: 03/29/2022 Goal Status: Met Ulcer/skin breakdown will have a volume reduction of 50% by week 8 Date Initiated: 03/31/2022 Target Resolution Date: 05/13/2023 Goal Status: Active Interventions: Assess ulceration(s) every visit Treatment Activities: Topical wound management initiated : 03/01/2022 Notes: Electronic Signature(s) Signed: 05/23/2022 3:45:45 PM By: Dellie Catholic RN Entered By: Dellie Catholic on 05/23/2022  15:42:42 -------------------------------------------------------------------------------- Pain Assessment Details Patient Name: Date of Service: EDWA [name], [name] 05/23/2022 11:00 A M Medical Record Number: [id number] Patient Account Number: [id number] Date of Birth/Sex: Treating RN: 11/05/1931 (89 y.o. F) Primary Care Spiros Greenfeld: Deland Pretty Other Clinician: Referring Zahi Plaskett: Treating Avenir Lozinski/Extender: Raylene Everts in Treatment: 11 Active Problems Location of Pain Severity and Description of Pain Patient Has Paino Yes Site Locations Rate the pain. GENESIS, NOVOSAD ([id number]) [id number] Page 6 of 18 Rate the pain. Current Pain Level: 5 Worst Pain Level: 10 Least Pain Level: 0 Tolerable Pain Level: 1 Pain Management and Medication Current Pain Management: Electronic Signature(s) Signed: 05/23/2022 2:15:56 PM By: Worthy Rancher Entered By: Worthy Rancher on 05/23/2022 10:56:37 -------------------------------------------------------------------------------- Patient/Caregiver Education Details Patient Name: Date of Service: EDWA [name], EV ELYN C. 2/6/2024andnbsp11:00 A M Medical Record Number: [id number] Patient Account Number: [id number] Date of Birth/Gender: Treating RN: 09/07/1931 (89 y.o. [name] Primary Care Physician: Deland Pretty Other Clinician: Referring Physician: Treating Physician/Extender: Raylene Everts in Treatment: 11 Education Assessment Education Provided To: Patient Education Topics Provided Wound/Skin Impairment: Methods: Explain/Verbal Responses: Reinforcements needed Electronic Signature(s) Signed: 05/23/2022 3:45:45 PM By: Dellie Catholic RN Entered By: Dellie Catholic on 05/23/2022 15:42:58 -------------------------------------------------------------------------------- Wound Assessment Details Patient Name: Date of Service: EDWA [name], [name] 05/23/2022 11:00 A  M Medical Record Number: [id number] Patient Account Number: [id number] Date of Birth/Sex: Treating RN: 25-Nov-1931 (89 y.o. Elam Dutch Primary Care Shaney Deckman: Deland Pretty Other Clinician: Referring Jule Schlabach: Treating Taishaun Levels/Extender: Linward Headland, [facility]  C ([id number]) [id number] Page 7 of 18 Weeks in Treatment: 11 Wound Status Wound Number: 10 Primary Etiology: Pressure Ulcer Wound Location: Left T Third oe Wound Status: Open Wounding Event: Blister Comorbid History: Hypertension, Dementia Date Acquired: 05/12/2022 Weeks Of Treatment: 1 Clustered Wound: No Photos Wound Measurements Length: (cm) 0.7 Width: (cm) 0.8 Depth: (cm) 0.1 Area: (cm) 0.44 Volume: (cm) 0.044 % Reduction in Area: -1319.4% % Reduction in Volume: -1366.7% Epithelialization: Small (1-33%) Tunneling: No Undermining: No Wound Description Classification: Category/Stage II Exudate Amount: Medium Exudate Type: Serosanguineous Exudate Color: red, brown Foul Odor After Cleansing: No Slough/Fibrino No Wound Bed Granulation Amount: Small (1-33%) Exposed Structure Granulation Quality: Red Fascia Exposed: No Necrotic Amount: Large (67-100%) Fat Layer (Subcutaneous Tissue) Exposed: Yes Necrotic Quality: Eschar Tendon Exposed: No Muscle Exposed: No Joint Exposed: No Bone Exposed: No Periwound Skin Texture Texture Color No Abnormalities Noted: Yes No Abnormalities Noted: Yes Moisture Temperature / Pain No Abnormalities Noted: Yes Temperature: No Abnormality Treatment Notes Wound #10 (Toe Third) Wound Laterality: Left Cleanser Soap and Water Discharge Instruction: May shower and wash wound with dial antibacterial soap and water prior to dressing change. Peri-Wound Care Sween Lotion (Moisturizing lotion) Discharge Instruction: Apply moisturizing lotion as directed Topical Primary Dressing Sorbalgon AG Dressing, 4x4 (in/in) Discharge  Instruction: Apply to wound bed as instructed Secondary Dressing Secured With JEANNINE, PENNISI ([id number]) [id number].pdf Page 8 of 18 Compression Wrap Kerlix Roll 4.5x3.1 (in/yd) Discharge Instruction: Apply Kerlix and Coban compression as directed. Coban Self-Adherent Wrap 4x5 (in/yd) Discharge Instruction: Apply over Kerlix as directed. Compression Stockings Add-Ons Electronic Signature(s) Signed: 05/23/2022 3:45:45 PM By: Dellie Catholic RN Signed: 05/23/2022 5:05:54 PM By: Baruch Gouty RN, BSN Entered By: Dellie Catholic on 05/23/2022 11:33:48 -------------------------------------------------------------------------------- Wound Assessment Details Patient Name: Date of Service: EDWA [name], [name]. 05/23/2022 11:00 A M Medical Record Number: [id number] Patient Account Number: [id number] Date of Birth/Sex: Treating RN: 1931/06/03 (89 y.o. Martyn Malay, Linda Primary Care Jewelz Ricklefs: Deland Pretty Other Clinician: Referring Raniah Karan: Treating Lillias Difrancesco/Extender: Raylene Everts in Treatment: 11 Wound Status Wound Number: 2 Primary Etiology: Venous Leg Ulcer Wound Location: Left, Medial Lower Leg Wound Status: Open Wounding Event: Skin Tear/Laceration Comorbid History: Hypertension, Dementia Date Acquired: 02/04/2022 Weeks Of Treatment: 11 Clustered Wound: Yes Photos Wound Measurements Length: (cm) 3 Width: (cm) 0 Depth: (cm) 0 Area: (cm) Volume: (cm) % Reduction in Area: 79.2% .8 % Reduction in Volume: 79.2% .1 Epithelialization: Small (1-33%) 1.885 Tunneling: No 0.188 Undermining: No Wound Description Classification: Full Thickness Without Exposed Support Exudate Amount: None Present Structures Foul Odor After Cleansing: No Slough/Fibrino Yes Wound Bed Granulation Amount: Large (67-100%) Granulation Quality: Red, Hyper-granulation, Friable Necrotic Amount: Small (1-33%) Necrotic Quality: Eschar Periwound Skin  CARL, BLEECKER C ([id number]) [phone].pdf Page 9 of 18 Texture Color No Abnormalities Noted: Yes No Abnormalities Noted: Yes Moisture Temperature / Pain No Abnormalities Noted: No Temperature: No Abnormality Dry / Scaly: No Maceration: No Treatment Notes Wound #2 (Lower Leg) Wound Laterality: Left, Medial Cleanser Soap and Water Discharge Instruction: May shower and wash wound with dial antibacterial soap and water prior to dressing change. Peri-Wound Care Sween Lotion (Moisturizing lotion) Discharge Instruction: Apply moisturizing lotion as directed Topical Primary Dressing Sorbalgon AG Dressing, 4x4 (in/in) Discharge Instruction: Apply to wound bed as instructed Secondary Dressing Secured With Compression Wrap Kerlix Roll 4.5x3.1 (in/yd) Discharge Instruction: Apply Kerlix and Coban compression as directed. Coban Self-Adherent Wrap 4x5 (in/yd) Discharge Instruction: Apply over Kerlix as directed. Compression Stockings Environmental education officer) Signed:  05/23/2022 3:45:45 PM By: Dellie Catholic RN Signed: 05/23/2022 5:05:54 PM By: Baruch Gouty RN, BSN Entered By: Dellie Catholic on 05/23/2022 11:37:18 -------------------------------------------------------------------------------- Wound Assessment Details Patient Name: Date of Service: EDWA [name], [name]. 05/23/2022 11:00 A M Medical Record Number: [id number] Patient Account Number: [id number] Date of Birth/Sex: Treating RN: Oct 21, 1931 (89 y.o. Elam Dutch Primary Care Margareth Kanner: Deland Pretty Other Clinician: Referring Gedalya Jim: Treating Raykwon Hobbs/Extender: Raylene Everts in Treatment: 11 Wound Status Wound Number: 4 Primary Etiology: Cellulitis Wound Location: Right, Medial Lower Leg Wound Status: Open Wounding Event: Blister Comorbid History: Hypertension, Dementia Date Acquired: 04/18/2022 Weeks Of Treatment: 4 Clustered Wound: No Photos ROBINN, OVERHOLT ([id number]) [phone].pdf Page 10 of 18 Wound Measurements Length: (cm) 2.2 Width: (cm) 2.6 Depth: (cm) 0.1 Area: (cm) 4.492 Volume: (cm) 0.449 % Reduction in Area: 39.8% % Reduction in Volume: 39.8% Epithelialization: Small (1-33%) Tunneling: No Undermining: No Wound Description Classification: Full Thickness Without Exposed Support Structures Exudate Amount: Medium Exudate Type: Serosanguineous Exudate Color: red, brown Foul Odor After Cleansing: No Slough/Fibrino Yes Wound Bed Granulation Amount: Small (1-33%) Granulation Quality: Red, Pink Necrotic Amount: Large (67-100%) Necrotic Quality: Eschar, Adherent Slough Periwound Skin Texture Texture Color No Abnormalities Noted: Yes No Abnormalities Noted: No Rubor: Yes Moisture No Abnormalities Noted: No Temperature / Pain Dry / Scaly: No Temperature: No Abnormality Maceration: No Treatment Notes Wound #4 (Lower Leg) Wound Laterality: Right, Medial Cleanser Soap and Water Discharge Instruction: May shower and wash wound with dial antibacterial soap and water prior to dressing change. Peri-Wound Care Sween Lotion (Moisturizing lotion) Discharge Instruction: Apply moisturizing lotion as directed Topical Primary Dressing Sorbalgon AG Dressing, 4x4 (in/in) Discharge Instruction: Apply to wound bed as instructed Secondary Dressing Secured With Compression Wrap Kerlix Roll 4.5x3.1 (in/yd) Discharge Instruction: Apply Kerlix and Coban compression as directed. Coban Self-Adherent Wrap 4x5 (in/yd) Discharge Instruction: Apply over Kerlix as directed. Compression Stockings Add-Ons Electronic Signature(s) Signed: 05/23/2022 3:45:45 PM By: Dellie Catholic RN Oletta Lamas, [name] ([id number]) [phone].pdf Page 11 of 18 Signed: 05/23/2022 5:05:54 PM By: Baruch Gouty RN, BSN Entered By: Dellie Catholic on 05/23/2022  11:31:43 -------------------------------------------------------------------------------- Wound Assessment Details Patient Name: Date of Service: EDWA [name], EV Waymond Cera. 05/23/2022 11:00 A M Medical Record Number: [id number] Patient Account Number: [id number] Date of Birth/Sex: Treating RN: 1931/06/09 (89 y.o. Elam Dutch Primary Care Monzerat Handler: Deland Pretty Other Clinician: Referring Mayci Haning: Treating Sam Wunschel/Extender: Raylene Everts in Treatment: 11 Wound Status Wound Number: 5 Primary Etiology: Venous Leg Ulcer Wound Location: Left, Lateral Lower Leg Wound Status: Open Wounding Event: Blister Comorbid History: Hypertension, Dementia Date Acquired: 04/17/2022 Weeks Of Treatment: 4 Clustered Wound: No Photos Wound Measurements Length: (cm) 3.4 Width: (cm) 1.6 Depth: (cm) 0.1 Area: (cm) 4.273 Volume: (cm) 0.427 % Reduction in Area: -13.3% % Reduction in Volume: -13.3% Epithelialization: None Tunneling: No Undermining: No Wound Description Classification: Full Thickness Without Exposed Support Exudate Amount: None Present Structures Foul Odor After Cleansing: No Slough/Fibrino Yes Wound Bed Granulation Amount: Small (1-33%) Granulation Quality: Red Necrotic Amount: Large (67-100%) Necrotic Quality: Eschar, Adherent Slough Periwound Skin Texture Texture Color No Abnormalities Noted: Yes No Abnormalities Noted: No Rubor: Yes Moisture No Abnormalities Noted: No Temperature / Pain Temperature: No Abnormality Treatment Notes Wound #5 (Lower Leg) Wound Laterality: Left, Lateral Cleanser Soap and Water KEERTHANA, VANROSSUM C ([id number]) [phone].pdf Page 12 of 18 Discharge Instruction: May shower and wash wound with dial antibacterial soap and water prior to dressing change. Peri-Wound Care Sween Lotion (Moisturizing  lotion) Discharge Instruction: Apply moisturizing lotion as directed Topical Primary Dressing Sorbalgon  AG Dressing, 4x4 (in/in) Discharge Instruction: Apply to wound bed as instructed Secondary Dressing Secured With Compression Wrap Kerlix Roll 4.5x3.1 (in/yd) Discharge Instruction: Apply Kerlix and Coban compression as directed. Coban Self-Adherent Wrap 4x5 (in/yd) Discharge Instruction: Apply over Kerlix as directed. Compression Stockings Add-Ons Electronic Signature(s) Signed: 05/23/2022 3:45:45 PM By: Dellie Catholic RN Signed: 05/23/2022 5:05:54 PM By: Baruch Gouty RN, BSN Entered By: Dellie Catholic on 05/23/2022 11:37:58 -------------------------------------------------------------------------------- Wound Assessment Details Patient Name: Date of Service: EDWA [name], [name]. 05/23/2022 11:00 A M Medical Record Number: [id number] Patient Account Number: [id number] Date of Birth/Sex: Treating RN: 1931-04-20 (89 y.o. Elam Dutch Primary Care Taaliyah Delpriore: Deland Pretty Other Clinician: Referring Ajai Terhaar: Treating Muhammadali Ries/Extender: Raylene Everts in Treatment: 11 Wound Status Wound Number: 6 Primary Etiology: Venous Leg Ulcer Wound Location: Left, Posterior Lower Leg Wound Status: Open Wounding Event: Blister Comorbid History: Hypertension, Dementia Date Acquired: 04/17/2022 Weeks Of Treatment: 4 Clustered Wound: No Photos Wound Measurements Length: (cm) 0.4 Width: (cm) 0.7 Depth: (cm) 0.1 Area: (cm) 0.22 Lizarraga, Analysa C ([id number]) Volume: (cm) 0.022 % Reduction in Area: 65% % Reduction in Volume: 65.1% Epithelialization: Small (1-33%) Tunneling: No [phone].pdf Page 13 of 18 Undermining: No Wound Description Classification: Full Thickness Without Exposed Support Exudate Amount: None Present Structures Foul Odor After Cleansing: No Slough/Fibrino Yes Wound Bed Granulation Amount: Small (1-33%) Exposed Structure Granulation Quality: Red Fascia Exposed: No Necrotic Amount: Large (67-100%) Fat Layer  (Subcutaneous Tissue) Exposed: Yes Necrotic Quality: Eschar, Adherent Slough Tendon Exposed: No Muscle Exposed: No Joint Exposed: No Bone Exposed: No Periwound Skin Texture Texture Color No Abnormalities Noted: Yes No Abnormalities Noted: No Rubor: Yes Moisture No Abnormalities Noted: No Dry / Scaly: No Maceration: No Treatment Notes Wound #6 (Lower Leg) Wound Laterality: Left, Posterior Cleanser Soap and Water Discharge Instruction: May shower and wash wound with dial antibacterial soap and water prior to dressing change. Peri-Wound Care Sween Lotion (Moisturizing lotion) Discharge Instruction: Apply moisturizing lotion as directed Topical Primary Dressing Sorbalgon AG Dressing, 4x4 (in/in) Discharge Instruction: Apply to wound bed as instructed Secondary Dressing Secured With Compression Wrap Kerlix Roll 4.5x3.1 (in/yd) Discharge Instruction: Apply Kerlix and Coban compression as directed. Coban Self-Adherent Wrap 4x5 (in/yd) Discharge Instruction: Apply over Kerlix as directed. Compression Stockings Add-Ons Electronic Signature(s) Signed: 05/23/2022 3:45:45 PM By: Dellie Catholic RN Signed: 05/23/2022 5:05:54 PM By: Baruch Gouty RN, BSN Entered By: Dellie Catholic on 05/23/2022 11:38:43 -------------------------------------------------------------------------------- Wound Assessment Details Patient Name: Date of Service: EDWA [name], [name]. 05/23/2022 11:00 A M Medical Record Number: [id number] Patient Account Number: [id number] Date of Birth/Sex: Treating RN: Sep 01, 1931 (89 y.o. Elam Dutch Primary Care Kaidan Spengler: Deland Pretty Other Clinician: JALEXA, PIFER ([id number]) [id number].pdf Page 14 of 18 Referring Tarvares Lant: Treating Sayde Lish/Extender: Raylene Everts in Treatment: 11 Wound Status Wound Number: 7 Primary Etiology: Trauma, Other Wound Location: Left Wrist Wound Status: Open Wounding Event:  Hematoma Comorbid History: Hypertension, Dementia Date Acquired: 05/12/2022 Weeks Of Treatment: 1 Clustered Wound: No Photos Wound Measurements Length: (cm) 1.3 Width: (cm) 1.2 Depth: (cm) 0.1 Area: (cm) 1.225 Volume: (cm) 0.123 % Reduction in Area: 22% % Reduction in Volume: 21.7% Tunneling: No Undermining: No Wound Description Classification: Full Thickness Without Exposed Suppor Exudate Amount: Medium Exudate Type: Serosanguineous Exudate Color: red, brown t Structures Foul Odor After Cleansing: No Slough/Fibrino No Wound Bed Granulation Amount: Small (1-33%) Exposed Structure Granulation Quality: Red Fat Layer (  Subcutaneous Tissue) Exposed: Yes Necrotic Amount: Large (67-100%) Necrotic Quality: Adherent Slough Periwound Skin Texture Texture Color No Abnormalities Noted: Yes No Abnormalities Noted: Yes Moisture Temperature / Pain No Abnormalities Noted: Yes Temperature: No Abnormality Treatment Notes Wound #7 (Wrist) Wound Laterality: Left Cleanser Soap and Water Discharge Instruction: May shower and wash wound with dial antibacterial soap and water prior to dressing change. Peri-Wound Care Sween Lotion (Moisturizing lotion) Discharge Instruction: Apply moisturizing lotion as directed Topical Primary Dressing Sorbalgon AG Dressing, 4x4 (in/in) Discharge Instruction: Apply to wound bed as instructed Secondary Dressing Secured With Compression Wrap Kerlix Roll 4.5x3.1 (in/yd) SABRA, SESSLER ([id number]) [id number].pdf Page 15 of 18 Discharge Instruction: Apply Kerlix and Coban compression as directed. Coban Self-Adherent Wrap 4x5 (in/yd) Discharge Instruction: Apply over Kerlix as directed. Compression Stockings Add-Ons Electronic Signature(s) Signed: 05/23/2022 5:05:54 PM By: Baruch Gouty RN, BSN Entered By: Baruch Gouty on 05/23/2022  11:17:44 -------------------------------------------------------------------------------- Wound Assessment Details Patient Name: Date of Service: EDWA [name], EV Waymond Cera. 05/23/2022 11:00 A M Medical Record Number: [id number] Patient Account Number: [id number] Date of Birth/Sex: Treating RN: 13-Jul-1931 (89 y.o. Elam Dutch Primary Care Yovanny Coats: Deland Pretty Other Clinician: Referring Jarmarcus Wambold: Treating Leopold Smyers/Extender: Raylene Everts in Treatment: 11 Wound Status Wound Number: 8 Primary Etiology: Trauma, Other Wound Location: Right, Lateral Knee Wound Status: Open Wounding Event: Skin Tear/Laceration Comorbid History: Hypertension, Dementia Date Acquired: 05/12/2022 Weeks Of Treatment: 1 Clustered Wound: No Photos Wound Measurements Length: (cm) 2.8 Width: (cm) 1 Depth: (cm) 0.1 Area: (cm) 2.199 Volume: (cm) 0.22 % Reduction in Area: -522.9% % Reduction in Volume: -528.6% Epithelialization: Small (1-33%) Tunneling: No Undermining: No Wound Description Classification: Full Thickness Without Exposed Support Exudate Amount: Medium Exudate Type: Serosanguineous Exudate Color: red, brown Structures Foul Odor After Cleansing: No Slough/Fibrino Yes Wound Bed Granulation Amount: Small (1-33%) Exposed Structure Granulation Quality: Pink Fascia Exposed: No Necrotic Amount: Large (67-100%) Fat Layer (Subcutaneous Tissue) Exposed: Yes Necrotic Quality: Eschar, Adherent Slough Tendon Exposed: No Muscle Exposed: No Joint Exposed: No Bone Exposed: No [place]recque, Cherae C ([id number]) [phone].pdf Page 16 of 18 Periwound Skin Texture Texture Color No Abnormalities Noted: Yes No Abnormalities Noted: Yes Moisture Temperature / Pain No Abnormalities Noted: Yes Temperature: No Abnormality Treatment Notes Wound #8 (Knee) Wound Laterality: Right, Lateral Cleanser Soap and Water Discharge Instruction: May shower and wash wound  with dial antibacterial soap and water prior to dressing change. Peri-Wound Care Sween Lotion (Moisturizing lotion) Discharge Instruction: Apply moisturizing lotion as directed Topical Primary Dressing Sorbalgon AG Dressing, 4x4 (in/in) Discharge Instruction: Apply to wound bed as instructed Secondary Dressing Secured With Compression Wrap Kerlix Roll 4.5x3.1 (in/yd) Discharge Instruction: Apply Kerlix and Coban compression as directed. Coban Self-Adherent Wrap 4x5 (in/yd) Discharge Instruction: Apply over Kerlix as directed. Compression Stockings Add-Ons Electronic Signature(s) Signed: 05/23/2022 3:45:45 PM By: Dellie Catholic RN Signed: 05/23/2022 5:05:54 PM By: Baruch Gouty RN, BSN Entered By: Dellie Catholic on 05/23/2022 11:32:16 -------------------------------------------------------------------------------- Wound Assessment Details Patient Name: Date of Service: EDWA [name], [name]. 05/23/2022 11:00 A M Medical Record Number: [id number] Patient Account Number: [id number] Date of Birth/Sex: Treating RN: 1931/04/23 (89 y.o. Elam Dutch Primary Care Haddon Fyfe: Deland Pretty Other Clinician: Referring Lorenia Hoston: Treating Shabnam Ladd/Extender: Raylene Everts in Treatment: 11 Wound Status Wound Number: 9 Primary Etiology: Trauma, Other Wound Location: Left, Posterior Knee Wound Status: Open Wounding Event: Skin Tear/Laceration Comorbid History: Hypertension, Dementia Date Acquired: 05/11/2022 Weeks Of Treatment: 1 Clustered Wound: No Photos VELEDA, MUN ([id number]) [phone].pdf Page  17 of 18 Wound Measurements Length: (cm) 5.3 Width: (cm) 4 Depth: (cm) 0.1 Area: (cm) 16.65 Volume: (cm) 1.665 % Reduction in Area: 29.3% % Reduction in Volume: 29.3% Epithelialization: Small (1-33%) Tunneling: No Undermining: No Wound Description Classification: Full Thickness Without Exposed Sup Exudate Amount: Medium Exudate  Type: Serosanguineous Exudate Color: red, brown port Structures Wound Bed Granulation Amount: Small (1-33%) Exposed Structure Granulation Quality: Red Fascia Exposed: No Necrotic Amount: Large (67-100%) Fat Layer (Subcutaneous Tissue) Exposed: No Necrotic Quality: Eschar, Adherent Slough Tendon Exposed: No Muscle Exposed: No Joint Exposed: No Bone Exposed: No Periwound Skin Texture Texture Color No Abnormalities Noted: Yes No Abnormalities Noted: Yes Moisture Temperature / Pain No Abnormalities Noted: Yes Temperature: No Abnormality Treatment Notes Wound #9 (Knee) Wound Laterality: Left, Posterior Cleanser Soap and Water Discharge Instruction: May shower and wash wound with dial antibacterial soap and water prior to dressing change. Peri-Wound Care Sween Lotion (Moisturizing lotion) Discharge Instruction: Apply moisturizing lotion as directed Topical Primary Dressing Sorbalgon AG Dressing, 4x4 (in/in) Discharge Instruction: Apply to wound bed as instructed Secondary Dressing Secured With Compression Wrap Kerlix Roll 4.5x3.1 (in/yd) Discharge Instruction: Apply Kerlix and Coban compression as directed. Coban Self-Adherent Wrap 4x5 (in/yd) Discharge Instruction: Apply over Kerlix as directed. Compression Stockings Add-Ons Electronic Signature(s) DYANNE, YORKS ([id number]) [id number].pdf Page 18 of 18 Signed: 05/23/2022 3:45:45 PM By: Dellie Catholic RN Signed: 05/23/2022 5:05:54 PM By: Baruch Gouty RN, BSN Entered By: Dellie Catholic on 05/23/2022 11:40:18 -------------------------------------------------------------------------------- Vitals Details Patient Name: Date of Service: EDWA [name], [name]. 05/23/2022 11:00 A M Medical Record Number: [id number] Patient Account Number: [id number] Date of Birth/Sex: Treating RN: 11-17-1931 (89 y.o. F) Primary Care Danisa Kopec: Deland Pretty Other Clinician: Referring Tomasa Dobransky: Treating Elleana Stillson/Extender:  Raylene Everts in Treatment: 11 Vital Signs Time Taken: 10:55 Temperature (F): 97.7 Height (in): 66 Pulse (bpm): 88 Weight (lbs): 133 Respiratory Rate (breaths/min): 20 Body Mass Index (BMI): 21.5 Blood Pressure (mmHg): 123/77 Reference Range: 80 - 120 mg / dl Electronic Signature(s) Signed: 05/23/2022 3:45:45 PM By: Dellie Catholic RN Previous Signature: 05/23/2022 2:15:56 PM Version By: Worthy Rancher Entered By: Dellie Catholic on 05/23/2022 15:42:26

## 2022-05-24 NOTE — Progress Notes (Addendum)
[name] ([id number]) [id number] Page 1 of 17 Visit Report for 05/23/2022 Chief Complaint Document Details Patient Name: Date of Service: [name] 05/23/2022 11:00 A M Medical Record Number: [id number] Patient Account Number: [id number] Date of Birth/Sex: Treating RN: April 08, 1932 (89 y.o. F) Primary Care Provider: Deland Pretty Other Clinician: Referring Provider: Treating Provider/Extender: Raylene Everts in Treatment: 11 Information Obtained from: Patient Chief Complaint Patient seen for complaints of Non-Healing Wounds. Electronic Signature(s) Signed: 05/23/2022 11:55:28 AM By: Fredirick Maudlin MD FACS Entered By: Fredirick Maudlin on 05/23/2022 11:55:28 -------------------------------------------------------------------------------- Debridement Details Patient Name: Date of Service: Cynthia [name], [name]. 05/23/2022 11:00 A M Medical Record Number: [id number] Patient Account Number: [id number] Date of Birth/Sex: Treating RN: 03/15/1932 (89 y.o. [name] Primary Care Provider: Deland Pretty Other Clinician: Referring Provider: Treating Provider/Extender: Raylene Everts in Treatment: 11 Debridement Performed for Assessment: Wound #10 Left T Third oe Performed By: Physician Fredirick Maudlin, MD Debridement Type: Debridement Level of Consciousness (Pre-procedure): Awake and Alert Pre-procedure Verification/Time Out Yes - 11:50 Taken: Start Time: 11:50 Pain Control: Lidocaine 4% T opical Solution T Area Debrided (L x W): otal 0.7 (cm) x 0.8 (cm) = 0.56 (cm) Tissue and other material debrided: Non-Viable, Slough, Slough Level: Non-Viable Tissue Debridement Description: Selective/Open Wound Instrument: Curette Bleeding: Minimum Hemostasis Achieved: Pressure End Time: 11:52 Procedural Pain: 0 Post Procedural Pain: 0 Response to Treatment: Procedure was tolerated well Level of  Consciousness (Post- Awake and Alert procedure): Post Debridement Measurements of Total Wound Length: (cm) 0.7 Stage: Category/Stage II Width: (cm) 0.8 Depth: (cm) 0.1 Volume: (cm) 0.044 Character of Wound/Ulcer Post Debridement: Improved Post Procedure Diagnosis Same as Pre-procedure Notes Scribed for Dr. Celine Ahr by J.S. Electronic Signature(s) Signed: 05/23/2022 12:09:56 PM By: Fredirick Maudlin MD FACS Signed: 05/23/2022 5:05:54 PM By: Baruch Gouty RN, BSN [address], [name] ([id number]) [id number] Page 2 of 17 Entered By: Baruch Gouty on 05/23/2022 11:56:39 -------------------------------------------------------------------------------- Debridement Details Patient Name: Date of Service: [name] 05/23/2022 11:00 A M Medical Record Number: [id number] Patient Account Number: [id number] Date of Birth/Sex: Treating RN: 1931-08-03 (89 y.o. [name] Primary Care Provider: Deland Pretty Other Clinician: Referring Provider: Treating Provider/Extender: Raylene Everts in Treatment: 11 Debridement Performed for Assessment: Wound #2 Left,Medial Lower Leg Performed By: Physician Fredirick Maudlin, MD Debridement Type: Debridement Severity of Tissue Pre Debridement: Fat layer exposed Level of Consciousness (Pre-procedure): Awake and Alert Pre-procedure Verification/Time Out Yes - 11:50 Taken: Start Time: 11:50 Pain Control: Lidocaine 4% T opical Solution T Area Debrided (L x W): otal 3 (cm) x 0.8 (cm) = 2.4 (cm) Tissue and other material debrided: Non-Viable, Slough, Slough Level: Non-Viable Tissue Debridement Description: Selective/Open Wound Instrument: Curette Bleeding: Minimum Hemostasis Achieved: Pressure End Time: 11:52 Procedural Pain: 0 Post Procedural Pain: 0 Response to Treatment: Procedure was tolerated well Level of Consciousness (Post- Awake and Alert procedure): Post Debridement Measurements of  Total Wound Length: (cm) 3 Width: (cm) 0.8 Depth: (cm) 0.1 Volume: (cm) 0.188 Character of Wound/Ulcer Post Debridement: Improved Severity of Tissue Post Debridement: Fat layer exposed Post Procedure Diagnosis Same as Pre-procedure Notes Scribed for Dr. Celine Ahr by J.S. Electronic Signature(s) Signed: 05/23/2022 12:09:56 PM By: Fredirick Maudlin MD FACS Signed: 05/23/2022 5:05:54 PM By: Baruch Gouty RN, BSN Entered By: Baruch Gouty on 05/23/2022 11:57:49 -------------------------------------------------------------------------------- Debridement Details Patient Name: Date of Service: Cynthia [name], [name]. 05/23/2022 11:00 A M Medical Record Number: [id number] Patient Account Number: [id number]  Date of Birth/Sex: Treating RN: [birth year] (88 y.o. [name] Primary Care Provider: Deland Pretty Other Clinician: Referring Provider: Treating Provider/Extender: Raylene Everts in Treatment: 11 Debridement Performed for Assessment: Wound #4 Right,Medial Lower Leg Performed By: Physician Fredirick Maudlin, MD Debridement Type: Debridement Level of Consciousness (Pre-procedure): Awake and Alert Pre-procedure Verification/Time Out Yes - 11:50 Taken: Start Time: 11:50 Pain Control: Lidocaine 4% T opical Solution T Area Debrided (L x W): otal 2.2 (cm) x 2.6 (cm) = 5.72 (cm) [name], [name] ([id number]) [id number] Page 3 of 17 Tissue and other material debrided: Non-Viable, Slough, Slough Level: Non-Viable Tissue Debridement Description: Selective/Open Wound Instrument: Curette Bleeding: Minimum Hemostasis Achieved: Pressure End Time: 11:52 Procedural Pain: 0 Post Procedural Pain: 0 Response to Treatment: Procedure was tolerated well Level of Consciousness (Post- Awake and Alert procedure): Post Debridement Measurements of Total Wound Length: (cm) 2.2 Width: (cm) 2.6 Depth: (cm) 0.1 Volume: (cm) 0.449 Character of  Wound/Ulcer Post Debridement: Improved Post Procedure Diagnosis Same as Pre-procedure Notes Scribed for Dr. Celine Ahr by J.Scotton (J.S.) Electronic Signature(s) Signed: 05/23/2022 12:09:56 PM By: Fredirick Maudlin MD FACS Signed: 05/23/2022 5:05:54 PM By: Baruch Gouty RN, BSN Entered By: Baruch Gouty on 05/23/2022 11:58:45 -------------------------------------------------------------------------------- Debridement Details Patient Name: Date of Service: Cynthia [name], [name]. 05/23/2022 11:00 A M Medical Record Number: [id number] Patient Account Number: [id number] Date of Birth/Sex: Treating RN: [birth year] (89 y.o. [name] Primary Care Provider: Deland Pretty Other Clinician: Referring Provider: Treating Provider/Extender: Raylene Everts in Treatment: 11 Debridement Performed for Assessment: Wound #5 Left,Lateral Lower Leg Performed By: Physician Fredirick Maudlin, MD Debridement Type: Debridement Severity of Tissue Pre Debridement: Fat layer exposed Level of Consciousness (Pre-procedure): Awake and Alert Pre-procedure Verification/Time Out Yes - 11:50 Taken: Start Time: 11:50 Pain Control: Lidocaine 4% T opical Solution T Area Debrided (L x W): otal 3.4 (cm) x 1.6 (cm) = 5.44 (cm) Tissue and other material debrided: Non-Viable, Slough, Slough Level: Non-Viable Tissue Debridement Description: Selective/Open Wound Instrument: Curette Bleeding: Minimum Hemostasis Achieved: Pressure End Time: 11:52 Procedural Pain: 0 Post Procedural Pain: 0 Response to Treatment: Procedure was tolerated well Level of Consciousness (Post- Awake and Alert procedure): Post Debridement Measurements of Total Wound Length: (cm) 3.4 Width: (cm) 1.6 Depth: (cm) 0.1 Volume: (cm) 0.427 Character of Wound/Ulcer Post Debridement: Improved Severity of Tissue Post Debridement: Fat layer exposed Post Procedure Diagnosis Same as Pre-procedure [name], [name] ([id number])  [id number] Page 4 of 17 Notes Scribed for Dr. Celine Ahr by J.Scotton Electronic Signature(s) Signed: 05/23/2022 12:09:56 PM By: Fredirick Maudlin MD FACS Signed: 05/23/2022 5:05:54 PM By: Baruch Gouty RN, BSN Entered By: Baruch Gouty on 05/23/2022 11:59:39 -------------------------------------------------------------------------------- Debridement Details Patient Name: Date of Service: Cynthia [name], [name]. 05/23/2022 11:00 A M Medical Record Number: [id number] Patient Account Number: [id number] Date of Birth/Sex: Treating RN: 09-Jul-1931 (89 y.o. [name] Primary Care Provider: Deland Pretty Other Clinician: Referring Provider: Treating Provider/Extender: Raylene Everts in Treatment: 11 Debridement Performed for Assessment: Wound #6 Left,Posterior Lower Leg Performed By: Physician Fredirick Maudlin, MD Debridement Type: Debridement Severity of Tissue Pre Debridement: Fat layer exposed Level of Consciousness (Pre-procedure): Awake and Alert Pre-procedure Verification/Time Out Yes - 11:50 Taken: Start Time: 11:50 Pain Control: Lidocaine 4% T opical Solution T Area Debrided (L x W): otal 0.4 (cm) x 0.7 (cm) = 0.28 (cm) Tissue and other material debrided: Non-Viable, Slough, Slough Level: Non-Viable Tissue Debridement Description: Selective/Open Wound Instrument: Curette Bleeding: Minimum Hemostasis Achieved: Pressure  End Time: 11:52 Procedural Pain: 0 Post Procedural Pain: 0 Response to Treatment: Procedure was tolerated well Level of Consciousness (Post- Awake and Alert procedure): Post Debridement Measurements of Total Wound Length: (cm) 0.4 Width: (cm) 0.7 Depth: (cm) 0.1 Volume: (cm) 0.022 Character of Wound/Ulcer Post Debridement: Improved Severity of Tissue Post Debridement: Fat layer exposed Post Procedure Diagnosis Same as Pre-procedure Notes Scribed for Dr. Celine Ahr by J.S. Electronic Signature(s) Signed:  05/23/2022 12:09:56 PM By: Fredirick Maudlin MD FACS Signed: 05/23/2022 5:05:54 PM By: Baruch Gouty RN, BSN Entered By: Baruch Gouty on 05/23/2022 12:00:46 -------------------------------------------------------------------------------- Debridement Details Patient Name: Date of Service: Cynthia [name], [name]. 05/23/2022 11:00 A M Medical Record Number: [id number] Patient Account Number: [id number] Date of Birth/Sex: Treating RN: [birth year] (89 y.o. [name] Primary Care Provider: Deland Pretty Other Clinician: Referring Provider: Treating Provider/Extender: Raylene Everts in Treatment: 11 Debridement Performed for Assessment: Wound #7 Left Wrist CHEREEN, BARRECA ([id number]) [phone].pdf Page 5 of 17 Performed By: Physician Fredirick Maudlin, MD Debridement Type: Debridement Level of Consciousness (Pre-procedure): Awake and Alert Pre-procedure Verification/Time Out Yes - 11:50 Taken: Start Time: 11:50 Pain Control: Lidocaine 4% T opical Solution T Area Debrided (L x W): otal 1.3 (cm) x 1.2 (cm) = 1.56 (cm) Tissue and other material debrided: Non-Viable, Slough, Slough Level: Non-Viable Tissue Debridement Description: Selective/Open Wound Instrument: Curette Bleeding: Minimum Hemostasis Achieved: Pressure End Time: 11:52 Procedural Pain: 0 Post Procedural Pain: 0 Response to Treatment: Procedure was tolerated well Level of Consciousness (Post- Awake and Alert procedure): Post Debridement Measurements of Total Wound Length: (cm) 1.3 Width: (cm) 1.2 Depth: (cm) 0.1 Volume: (cm) 0.123 Character of Wound/Ulcer Post Debridement: Improved Post Procedure Diagnosis Same as Pre-procedure Notes Scribed for Dr. Celine Ahr by J.Scotton Electronic Signature(s) Signed: 05/23/2022 12:09:56 PM By: Fredirick Maudlin MD FACS Signed: 05/23/2022 5:05:54 PM By: Baruch Gouty RN, BSN Entered By: Baruch Gouty on 05/23/2022  12:01:47 -------------------------------------------------------------------------------- Debridement Details Patient Name: Date of Service: Cynthia [name], [name]. 05/23/2022 11:00 A M Medical Record Number: [id number] Patient Account Number: [id number] Date of Birth/Sex: Treating RN: [birth year] (89 y.o. [name] Primary Care Provider: Deland Pretty Other Clinician: Referring Provider: Treating Provider/Extender: Raylene Everts in Treatment: 11 Debridement Performed for Assessment: Wound #8 Right,Lateral Knee Performed By: Physician Fredirick Maudlin, MD Debridement Type: Debridement Level of Consciousness (Pre-procedure): Awake and Alert Pre-procedure Verification/Time Out Yes - 11:50 Taken: Start Time: 11:50 Pain Control: Lidocaine 4% T opical Solution T Area Debrided (L x W): otal 2.8 (cm) x 1 (cm) = 2.8 (cm) Tissue and other material debrided: Non-Viable, Slough, Slough Level: Non-Viable Tissue Debridement Description: Selective/Open Wound Instrument: Curette Bleeding: Minimum Hemostasis Achieved: Pressure End Time: 11:52 Procedural Pain: 0 Post Procedural Pain: 0 Response to Treatment: Procedure was tolerated well Level of Consciousness (Post- Awake and Alert procedure): Post Debridement Measurements of Total Wound Length: (cm) 2.8 Width: (cm) 1 [place], Labrina Mitchell ([id number].pdf Page 6 of 17 Depth: (cm) 0.1 Volume: (cm) 0.22 Character of Wound/Ulcer Post Debridement: Improved Post Procedure Diagnosis Same as Pre-procedure Notes Scribed for Dr. Celine Ahr by J.S. Electronic Signature(s) Signed: 05/23/2022 12:09:56 PM By: Fredirick Maudlin MD FACS Signed: 05/23/2022 5:05:54 PM By: Baruch Gouty RN, BSN Entered By: Baruch Gouty on 05/23/2022 12:02:45 -------------------------------------------------------------------------------- Debridement Details Patient Name: Date of Service: Cynthia [name], [name].  05/23/2022 11:00 A M Medical Record Number: [id number] Patient Account Number: [id number] Date of Birth/Sex: Treating RN: October 31, 1931 (89 y.o. Cynthia Malay, [facility]  Provider: Deland Pretty Other Clinician: Referring Provider: Treating Provider/Extender: Raylene Everts in Treatment: 11 Debridement Performed for Assessment: Wound #9 Left,Posterior Knee Performed By: Physician Fredirick Maudlin, MD Debridement Type: Debridement Level of Consciousness (Pre-procedure): Awake and Alert Pre-procedure Verification/Time Out Yes - 11:50 Taken: Start Time: 11:50 Pain Control: Lidocaine 4% T opical Solution T Area Debrided (L x W): otal 5.3 (cm) x 4 (cm) = 21.2 (cm) Tissue and other material debrided: Non-Viable, Slough, Slough Level: Non-Viable Tissue Debridement Description: Selective/Open Wound Instrument: Curette Bleeding: Minimum Hemostasis Achieved: Pressure End Time: 11:52 Procedural Pain: 0 Post Procedural Pain: 0 Response to Treatment: Procedure was tolerated well Level of Consciousness (Post- Awake and Alert procedure): Post Debridement Measurements of Total Wound Length: (cm) 5.3 Width: (cm) 4 Depth: (cm) 0.1 Volume: (cm) 1.665 Character of Wound/Ulcer Post Debridement: Improved Post Procedure Diagnosis Same as Pre-procedure Notes Scribed for Dr. Celine Ahr by J.Scotton Electronic Signature(s) Signed: 05/23/2022 12:09:56 PM By: Fredirick Maudlin MD FACS Signed: 05/23/2022 5:05:54 PM By: Baruch Gouty RN, BSN Entered By: Baruch Gouty on 05/23/2022 12:03:41 -------------------------------------------------------------------------------- HPI Details Patient Name: Date of Service: Cynthia [name], EV ELYN Mitchell. 05/23/2022 11:00 A M Medical Record Number: [id number] Patient Account Number: [id number] BERNARDINE, STERRY ([id number]) [id number] Page 7 of 17 Date of Birth/Sex: Treating RN: 1931/05/02 (89 y.o. F) Primary Care Provider:  Deland Pretty Other Clinician: Referring Provider: Treating Provider/Extender: Raylene Everts in Treatment: 11 History of Present Illness HPI Description: ADMISSION 03/01/2022 This is an 89 year old woman with dementia, residing in a memory care facility. She has a past medical history significant for renal failure, status post kidney transplant and now off dialysis. She is on chronic immunosuppression with tacrolimus and prednisone. She was recently admitted with an extensive DVT of the left lower extremity, identified when her nursing facility staff noticed extreme swelling of her left lower leg. She was initiated on Eliquis. Vascular surgery saw her and recommended compression stockings. Unfortunately, her skin is so thin that the stockings, combined with the leg swelling, resulted in multiple wounds opening up on her left lower leg. ABI in clinic today was only 0.61. There are multiple lesions open on her leg, including a cluster of 3 just above the lateral ankle, 1 on her posterior calf, and another on the medial lower leg. They all have thick slough and eschar present. The skin of her leg is discolored and blue. 03/07/2022: All of the wounds are cleaner today. They have slough present but the eschar has softened. Edema control is good. 12/15; 2 of her 3 wounds are closed the only 1 remaining on the left lower leg is the 1 on the posterior calf and this looks healthy and measuring smaller. We have been using silver alginate with kerlix and Coban. Patient lives in the dementia unit at [facility] facility which is a life care facility. 04/25/2022: Since the last visit in our clinic, the patient has suffered fairly significant decline in her cognitive function. She has been made chiefly comfort care measures and DNR. She has been moved from the memory care unit to the nursing area of her living facility. She also developed an acute DVT fairly , extensive from the report,  in her right leg. Her TED hose were removed out of concern for potentially breaking the clot off and causing it to mobilize. Her Eliquis was resumed and as a result, she has developed multiple small hematomas on her leg, 1 of which has ruptured. She was referred back  to the wound care center to have that site managed. The paperwork from her facility describes the area as necrotic, but what I see on exam is simply clotted blood. They have been applying Xeroform and Kerlix to her legs. She has 2+ pitting edema bilaterally. 05/16/2022: In the interval since our last visit, she has sustained multiple new wounds that appear secondary to falls. She has skin tears on her wrist, right lateral knee, left posterior knee, and left medial leg that are new. The large wound that I saw her for on January 9 has cleaned up quite nicely and is now rather superficial. Her edema is very poorly controlled. 05/23/2022: She has yet another new wound on her right lower leg just lateral to and below her knee this has been Steri-Stripped. Her right fourth toe is bright red, tender, and swollen, but there is no obvious wound. All of her other wounds look about the same. There is some slough accumulation on the surfaces. Electronic Signature(s) Signed: 05/23/2022 11:57:57 AM By: Fredirick Maudlin MD FACS Entered By: Fredirick Maudlin on 05/23/2022 11:57:57 -------------------------------------------------------------------------------- Physical Exam Details Patient Name: Date of Service: Cynthia [name], EV Lucia Estelle Mitchell. 05/23/2022 11:00 A M Medical Record Number: [id number] Patient Account Number: [id number] Date of Birth/Sex: Treating RN: 14-Jan-1932 (89 y.o. F) Primary Care Provider: Deland Pretty Other Clinician: Referring Provider: Treating Provider/Extender: Raylene Everts in Treatment: 11 Constitutional . . . She is much more confused than usual today. Notes 05/23/2022: She has yet another new wound on her right  lower leg just lateral to and below her knee this has been Steri-Stripped. Her right fourth toe is bright red, tender, and swollen, but there is no obvious wound. All of her other wounds look about the same. There is some slough accumulation on the surfaces. Electronic Signature(s) Signed: 05/23/2022 11:59:21 AM By: Fredirick Maudlin MD FACS Entered By: Fredirick Maudlin on 05/23/2022 11:59:21 -------------------------------------------------------------------------------- Physician Orders Details Patient Name: Date of Service: Cynthia [name], [name]. 05/23/2022 11:00 A M Medical Record Number: [id number] Patient Account Number: [id number] Date of Birth/Sex: Treating RN: 1932-01-28 (89 y.o. [name] Primary Care Provider: Deland Pretty Other Clinician: Referring Provider: Treating Provider/Extender: Raylene Everts in Treatment: 11 Verbal / Phone Orders: No AIZA, COTLER ([id number]) [id number] Page 8 of 17 Diagnosis Coding ICD-10 Coding Code Description [phone] Non-pressure chronic ulcer of other part of right lower leg with fat layer exposed I82.492 Acute embolism and thrombosis of other specified deep vein of left lower extremity Dementia in other diseases classified elsewhere, unspecified severity, without behavioral disturbance, psychotic disturbance, mood F02.80 disturbance, and anxiety I82.491 Acute embolism and thrombosis of other specified deep vein of right lower extremity L97.822 Non-pressure chronic ulcer of other part of left lower leg with fat layer exposed L98.492 Non-pressure chronic ulcer of skin of other sites with fat layer exposed Follow-up Appointments ppointment in 2 weeks. - Dr. Celine Ahr Room 3 Return A Anesthetic Wound #2 Left,Medial Lower Leg (In clinic) Topical Lidocaine 5% applied to wound bed Bathing/ Shower/ Hygiene May shower and wash wound with soap and water. Edema Control - Lymphedema / SCD /  Other Avoid standing for long periods of time. Wound Treatment Wound #10 - T Third oe Wound Laterality: Left Cleanser: Soap and Water 1 x Per Day/30 Days Discharge Instructions: May shower and wash wound with dial antibacterial soap and water prior to dressing change. Peri-Wound Care: Sween Lotion (Moisturizing lotion) 1 x Per Day/30 Days Discharge Instructions: Apply  moisturizing lotion as directed Prim Dressing: Sorbalgon AG Dressing, 4x4 (in/in) 1 x Per Day/30 Days ary Discharge Instructions: Apply to wound bed as instructed Compression Wrap: Kerlix Roll 4.5x3.1 (in/yd) 1 x Per Day/30 Days Discharge Instructions: Apply Kerlix and Coban compression as directed. Compression Wrap: Coban Self-Adherent Wrap 4x5 (in/yd) 1 x Per Day/30 Days Discharge Instructions: Apply over Kerlix as directed. Wound #2 - Lower Leg Wound Laterality: Left, Medial Cleanser: Soap and Water 1 x Per Day/30 Days Discharge Instructions: May shower and wash wound with dial antibacterial soap and water prior to dressing change. Peri-Wound Care: Sween Lotion (Moisturizing lotion) 1 x Per Day/30 Days Discharge Instructions: Apply moisturizing lotion as directed Prim Dressing: Sorbalgon AG Dressing, 4x4 (in/in) 1 x Per Day/30 Days ary Discharge Instructions: Apply to wound bed as instructed Compression Wrap: Kerlix Roll 4.5x3.1 (in/yd) 1 x Per Day/30 Days Discharge Instructions: Apply Kerlix and Coban compression as directed. Compression Wrap: Coban Self-Adherent Wrap 4x5 (in/yd) 1 x Per Day/30 Days Discharge Instructions: Apply over Kerlix as directed. Wound #4 - Lower Leg Wound Laterality: Right, Medial Cleanser: Soap and Water 1 x Per Day/30 Days Discharge Instructions: May shower and wash wound with dial antibacterial soap and water prior to dressing change. Peri-Wound Care: Sween Lotion (Moisturizing lotion) 1 x Per Day/30 Days Discharge Instructions: Apply moisturizing lotion as directed Prim Dressing:  Sorbalgon AG Dressing, 4x4 (in/in) 1 x Per Day/30 Days ary Discharge Instructions: Apply to wound bed as instructed Compression Wrap: Kerlix Roll 4.5x3.1 (in/yd) 1 x Per Day/30 Days Discharge Instructions: Apply Kerlix and Coban compression as directed. Compression Wrap: Coban Self-Adherent Wrap 4x5 (in/yd) 1 x Per Day/30 Days Discharge Instructions: Apply over Kerlix as directed. TARNA, YORE ([id number]) [id number] Page 9 of 17 Wound #5 - Lower Leg Wound Laterality: Left, Lateral Cleanser: Soap and Water 1 x Per Day/30 Days Discharge Instructions: May shower and wash wound with dial antibacterial soap and water prior to dressing change. Peri-Wound Care: Sween Lotion (Moisturizing lotion) 1 x Per Day/30 Days Discharge Instructions: Apply moisturizing lotion as directed Prim Dressing: Sorbalgon AG Dressing, 4x4 (in/in) 1 x Per Day/30 Days ary Discharge Instructions: Apply to wound bed as instructed Compression Wrap: Kerlix Roll 4.5x3.1 (in/yd) 1 x Per Day/30 Days Discharge Instructions: Apply Kerlix and Coban compression as directed. Compression Wrap: Coban Self-Adherent Wrap 4x5 (in/yd) 1 x Per Day/30 Days Discharge Instructions: Apply over Kerlix as directed. Wound #6 - Lower Leg Wound Laterality: Left, Posterior Cleanser: Soap and Water 1 x Per Day/30 Days Discharge Instructions: May shower and wash wound with dial antibacterial soap and water prior to dressing change. Peri-Wound Care: Sween Lotion (Moisturizing lotion) 1 x Per Day/30 Days Discharge Instructions: Apply moisturizing lotion as directed Prim Dressing: Sorbalgon AG Dressing, 4x4 (in/in) 1 x Per Day/30 Days ary Discharge Instructions: Apply to wound bed as instructed Compression Wrap: Kerlix Roll 4.5x3.1 (in/yd) 1 x Per Day/30 Days Discharge Instructions: Apply Kerlix and Coban compression as directed. Compression Wrap: Coban Self-Adherent Wrap 4x5 (in/yd) 1 x Per Day/30 Days Discharge  Instructions: Apply over Kerlix as directed. Wound #7 - Wrist Wound Laterality: Left Cleanser: Soap and Water 1 x Per Day/30 Days Discharge Instructions: May shower and wash wound with dial antibacterial soap and water prior to dressing change. Peri-Wound Care: Sween Lotion (Moisturizing lotion) 1 x Per Day/30 Days Discharge Instructions: Apply moisturizing lotion as directed Prim Dressing: Sorbalgon AG Dressing, 4x4 (in/in) 1 x Per Day/30 Days ary Discharge Instructions: Apply to wound bed as instructed Compression  Wrap: Kerlix Roll 4.5x3.1 (in/yd) 1 x Per Day/30 Days Discharge Instructions: Apply Kerlix and Coban compression as directed. Compression Wrap: Coban Self-Adherent Wrap 4x5 (in/yd) 1 x Per Day/30 Days Discharge Instructions: Apply over Kerlix as directed. Wound #8 - Knee Wound Laterality: Right, Lateral Cleanser: Soap and Water 1 x Per Day/30 Days Discharge Instructions: May shower and wash wound with dial antibacterial soap and water prior to dressing change. Peri-Wound Care: Sween Lotion (Moisturizing lotion) 1 x Per Day/30 Days Discharge Instructions: Apply moisturizing lotion as directed Prim Dressing: Sorbalgon AG Dressing, 4x4 (in/in) 1 x Per Day/30 Days ary Discharge Instructions: Apply to wound bed as instructed Compression Wrap: Kerlix Roll 4.5x3.1 (in/yd) 1 x Per Day/30 Days Discharge Instructions: Apply Kerlix and Coban compression as directed. Compression Wrap: Coban Self-Adherent Wrap 4x5 (in/yd) 1 x Per Day/30 Days Discharge Instructions: Apply over Kerlix as directed. Wound #9 - Knee Wound Laterality: Left, Posterior Cleanser: Soap and Water 1 x Per Day/30 Days Discharge Instructions: May shower and wash wound with dial antibacterial soap and water prior to dressing change. Peri-Wound Care: Sween Lotion (Moisturizing lotion) 1 x Per Day/30 Days Discharge Instructions: Apply moisturizing lotion as directed Prim Dressing: Sorbalgon AG Dressing, 4x4 (in/in) 1 x  Per Day/30 Days ary Discharge Instructions: Apply to wound bed as instructed Compression Wrap: Kerlix Roll 4.5x3.1 (in/yd) 1 x Per Day/30 Days [name], [name] ([id number]) [phone].pdf Page 10 of 17 Discharge Instructions: Apply Kerlix and Coban compression as directed. Compression Wrap: Coban Self-Adherent Wrap 4x5 (in/yd) 1 x Per Day/30 Days Discharge Instructions: Apply over Kerlix as directed. Electronic Signature(s) Signed: 05/23/2022 12:09:56 PM By: Fredirick Maudlin MD FACS Signed: 05/23/2022 3:45:45 PM By: Dellie Catholic RN Entered By: Dellie Catholic on 05/23/2022 12:09:20 -------------------------------------------------------------------------------- Problem List Details Patient Name: Date of Service: Cynthia [name], [name]. 05/23/2022 11:00 A M Medical Record Number: [id number] Patient Account Number: [id number] Date of Birth/Sex: Treating RN: [birth year] (89 y.o. F) Primary Care Provider: Deland Pretty Other Clinician: Referring Provider: Treating Provider/Extender: Raylene Everts in Treatment: 11 Active Problems ICD-10 Encounter Code Description Active Date MDM Diagnosis L97.812 Non-pressure chronic ulcer of other part of right lower leg with fat layer 04/25/2022 No Yes exposed I82.492 Acute embolism and thrombosis of other specified deep vein of left lower 03/01/2022 No Yes extremity F02.80 Dementia in other diseases classified elsewhere, unspecified severity, without 03/01/2022 No Yes behavioral disturbance, psychotic disturbance, mood disturbance, and anxiety I82.491 Acute embolism and thrombosis of other specified deep vein of right lower 04/25/2022 No Yes extremity L97.822 Non-pressure chronic ulcer of other part of left lower leg with fat layer 05/16/2022 No Yes exposed L98.492 Non-pressure chronic ulcer of skin of other sites with fat layer exposed 05/16/2022 No Yes Inactive Problems ICD-10 Code Description Active Date  Inactive Date L97.822 Non-pressure chronic ulcer of other part of left lower leg with fat layer exposed 03/01/2022 03/01/2022 Resolved Problems Electronic Signature(s) Signed: 05/23/2022 11:54:52 AM By: Fredirick Maudlin MD FACS Entered By: Fredirick Maudlin on 05/23/2022 11:54:52 Ki, [name] ([id number]) [id number] Page 11 of 17 -------------------------------------------------------------------------------- Progress Note Details Patient Name: Date of Service: [name] 05/23/2022 11:00 A M Medical Record Number: [id number] Patient Account Number: [id number] Date of Birth/Sex: Treating RN: 14-Jul-1931 (89 y.o. F) Primary Care Provider: Deland Pretty Other Clinician: Referring Provider: Treating Provider/Extender: Raylene Everts in Treatment: 11 Subjective Chief Complaint Information obtained from Patient Patient seen for complaints of Non-Healing Wounds. History of Present Illness (HPI) ADMISSION 03/01/2022  This is an 89 year old woman with dementia, residing in a memory care facility. She has a past medical history significant for renal failure, status post kidney transplant and now off dialysis. She is on chronic immunosuppression with tacrolimus and prednisone. She was recently admitted with an extensive DVT of the left lower extremity, identified when her nursing facility staff noticed extreme swelling of her left lower leg. She was initiated on Eliquis. Vascular surgery saw her and recommended compression stockings. Unfortunately, her skin is so thin that the stockings, combined with the leg swelling, resulted in multiple wounds opening up on her left lower leg. ABI in clinic today was only 0.61. There are multiple lesions open on her leg, including a cluster of 3 just above the lateral ankle, 1 on her posterior calf, and another on the medial lower leg. They all have thick slough and eschar present. The skin of her leg is  discolored and blue. 03/07/2022: All of the wounds are cleaner today. They have slough present but the eschar has softened. Edema control is good. 12/15; 2 of her 3 wounds are closed the only 1 remaining on the left lower leg is the 1 on the posterior calf and this looks healthy and measuring smaller. We have been using silver alginate with kerlix and Coban. Patient lives in the dementia unit at [facility] facility which is a life care facility. 04/25/2022: Since the last visit in our clinic, the patient has suffered fairly significant decline in her cognitive function. She has been made chiefly comfort care measures and DNR. She has been moved from the memory care unit to the nursing area of her living facility. She also developed an acute DVT fairly , extensive from the report, in her right leg. Her TED hose were removed out of concern for potentially breaking the clot off and causing it to mobilize. Her Eliquis was resumed and as a result, she has developed multiple small hematomas on her leg, 1 of which has ruptured. She was referred back to the wound care center to have that site managed. The paperwork from her facility describes the area as necrotic, but what I see on exam is simply clotted blood. They have been applying Xeroform and Kerlix to her legs. She has 2+ pitting edema bilaterally. 05/16/2022: In the interval since our last visit, she has sustained multiple new wounds that appear secondary to falls. She has skin tears on her wrist, right lateral knee, left posterior knee, and left medial leg that are new. The large wound that I saw her for on January 9 has cleaned up quite nicely and is now rather superficial. Her edema is very poorly controlled. 05/23/2022: She has yet another new wound on her right lower leg just lateral to and below her knee this has been Steri-Stripped. Her right fourth toe is bright red, tender, and swollen, but there is no obvious wound. All of her other wounds look  about the same. There is some slough accumulation on the surfaces. Patient History Information obtained from Patient. Family History Unknown History. Social History Never smoker, Marital Status - Married, Alcohol Use - Never, Drug Use - No History, Caffeine Use - Rarely. Medical History Eyes Denies history of Cataracts, Glaucoma, Optic Neuritis Ear/Nose/Mouth/Throat Denies history of Chronic sinus problems/congestion, Middle ear problems Hematologic/Lymphatic Denies history of Anemia, Hemophilia, Human Immunodeficiency Virus, Lymphedema, Sickle Cell Disease Respiratory Denies history of Aspiration, Asthma, Chronic Obstructive Pulmonary Disease (COPD), Pneumothorax, Sleep Apnea, Tuberculosis Cardiovascular Patient has history of Hypertension Gastrointestinal  Denies history of Cirrhosis , Colitis, Crohnoos, Hepatitis A, Hepatitis B Endocrine Denies history of Type I Diabetes, Type II Diabetes Genitourinary Denies history of End Stage Renal Disease Integumentary (Skin) Denies history of History of Burn Musculoskeletal Denies history of Gout, Rheumatoid Arthritis, Osteoarthritis, Osteomyelitis Neurologic Patient has history of Dementia Denies history of Neuropathy, Paraplegia, Seizure Disorder Psychiatric Denies history of Anorexia/bulimia, Confinement Anxiety Hospitalization/Surgery History - Orif patella right- 2015. - 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. - Breast surgery- unknown. Medical A Surgical History Notes nd KAMARIN, [name] ([id number]) [phone].pdf Page 12 of 17 Genitourinary polyneuropathy Objective Constitutional She is much more confused than usual today. Vitals Time Taken: 10:55 AM, Height: 66 in, Weight: 133 lbs, BMI: 21.5, Temperature: 97.7 F, Pulse: 88 bpm, Respiratory Rate: 20 breaths/min, Blood Pressure: 123/77 mmHg. General Notes: 05/23/2022: She has yet another new wound on her right lower leg just lateral to and below her knee  this has been Steri-Stripped. Her right fourth toe is bright red, tender, and swollen, but there is no obvious wound. All of her other wounds look about the same. There is some slough accumulation on the surfaces. Integumentary (Hair, Skin) Wound #10 status is Open. Original cause of wound was Blister. The date acquired was: 05/12/2022. The wound has been in treatment 1 weeks. The wound is located on the Left T Third. The wound measures 0.7cm length x 0.8cm width x 0.1cm depth; 0.44cm^2 area and 0.044cm^3 volume. There is Fat Layer oe (Subcutaneous Tissue) exposed. There is no tunneling or undermining noted. There is a medium amount of serosanguineous drainage noted. There is small (1- 33%) red granulation within the wound bed. There is a large (67-100%) amount of necrotic tissue within the wound bed including Eschar. The periwound skin appearance had no abnormalities noted for texture. The periwound skin appearance had no abnormalities noted for moisture. The periwound skin appearance had no abnormalities noted for color. Periwound temperature was noted as No Abnormality. Wound #2 status is Open. Original cause of wound was Skin T ear/Laceration. The date acquired was: 02/04/2022. The wound has been in treatment 11 weeks. The wound is located on the Left,Medial Lower Leg. The wound measures 3cm length x 0.8cm width x 0.1cm depth; 1.885cm^2 area and 0.188cm^3 volume. There is no tunneling or undermining noted. There is a none present amount of drainage noted. There is large (67-100%) red, friable, hyper - granulation within the wound bed. There is a small (1-33%) amount of necrotic tissue within the wound bed including Eschar. The periwound skin appearance had no abnormalities noted for texture. The periwound skin appearance had no abnormalities noted for color. The periwound skin appearance did not exhibit: Dry/Scaly, Maceration. Periwound temperature was noted as No Abnormality. Wound #4 status is  Open. Original cause of wound was Blister. The date acquired was: 04/18/2022. The wound has been in treatment 4 weeks. The wound is located on the Right,Medial Lower Leg. The wound measures 2.2cm length x 2.6cm width x 0.1cm depth; 4.492cm^2 area and 0.449cm^3 volume. There is no tunneling or undermining noted. There is a medium amount of serosanguineous drainage noted. There is small (1-33%) red, pink granulation within the wound bed. There is a large (67-100%) amount of necrotic tissue within the wound bed including Eschar and Adherent Slough. The periwound skin appearance had no abnormalities noted for texture. The periwound skin appearance exhibited: Rubor. The periwound skin appearance did not exhibit: Dry/Scaly, Maceration. Periwound temperature was noted as No Abnormality. Wound #5 status is Open. Original cause  of wound was Blister. The date acquired was: 04/17/2022. The wound has been in treatment 4 weeks. The wound is located on the Left,Lateral Lower Leg. The wound measures 3.4cm length x 1.6cm width x 0.1cm depth; 4.273cm^2 area and 0.427cm^3 volume. There is no tunneling or undermining noted. There is a none present amount of drainage noted. There is small (1-33%) red granulation within the wound bed. There is a large (67-100%) amount of necrotic tissue within the wound bed including Eschar and Adherent Slough. The periwound skin appearance had no abnormalities noted for texture. The periwound skin appearance exhibited: Rubor. Periwound temperature was noted as No Abnormality. Wound #6 status is Open. Original cause of wound was Blister. The date acquired was: 04/17/2022. The wound has been in treatment 4 weeks. The wound is located on the Left,Posterior Lower Leg. The wound measures 0.4cm length x 0.7cm width x 0.1cm depth; 0.22cm^2 area and 0.022cm^3 volume. There is Fat Layer (Subcutaneous Tissue) exposed. There is no tunneling or undermining noted. There is a none present amount of drainage  noted. There is small (1-33%) red granulation within the wound bed. There is a large (67-100%) amount of necrotic tissue within the wound bed including Eschar and Adherent Slough. The periwound skin appearance had no abnormalities noted for texture. The periwound skin appearance exhibited: Rubor. The periwound skin appearance did not exhibit: Dry/Scaly, Maceration. Wound #7 status is Open. Original cause of wound was Hematoma. The date acquired was: 05/12/2022. The wound has been in treatment 1 weeks. The wound is located on the Left Wrist. The wound measures 1.3cm length x 1.2cm width x 0.1cm depth; 1.225cm^2 area and 0.123cm^3 volume. There is Fat Layer (Subcutaneous Tissue) exposed. There is no tunneling or undermining noted. There is a medium amount of serosanguineous drainage noted. There is small (1- 33%) red granulation within the wound bed. There is a large (67-100%) amount of necrotic tissue within the wound bed including Adherent Slough. The periwound skin appearance had no abnormalities noted for texture. The periwound skin appearance had no abnormalities noted for moisture. The periwound skin appearance had no abnormalities noted for color. Periwound temperature was noted as No Abnormality. Wound #8 status is Open. Original cause of wound was Skin T ear/Laceration. The date acquired was: 05/12/2022. The wound has been in treatment 1 weeks. The wound is located on the Right,Lateral Knee. The wound measures 2.8cm length x 1cm width x 0.1cm depth; 2.199cm^2 area and 0.22cm^3 volume. There is Fat Layer (Subcutaneous Tissue) exposed. There is no tunneling or undermining noted. There is a medium amount of serosanguineous drainage noted. There is small (1-33%) pink granulation within the wound bed. There is a large (67-100%) amount of necrotic tissue within the wound bed including Eschar and Adherent Slough. The periwound skin appearance had no abnormalities noted for texture. The periwound skin  appearance had no abnormalities noted for moisture. The periwound skin appearance had no abnormalities noted for color. Periwound temperature was noted as No Abnormality. Wound #9 status is Open. Original cause of wound was Skin T ear/Laceration. The date acquired was: 05/11/2022. The wound has been in treatment 1 weeks. The wound is located on the Left,Posterior Knee. The wound measures 5.3cm length x 4cm width x 0.1cm depth; 16.65cm^2 area and 1.665cm^3 volume. There is no tunneling or undermining noted. There is a medium amount of serosanguineous drainage noted. There is small (1-33%) red granulation within the wound bed. There is a large (67-100%) amount of necrotic tissue within the wound bed including Eschar  and Adherent Slough. The periwound skin appearance had no abnormalities noted for texture. The periwound skin appearance had no abnormalities noted for moisture. The periwound skin appearance had no abnormalities noted for color. Periwound temperature was noted as No Abnormality. Assessment Active Problems ICD-10 [name], [name] ([id number]) [phone].pdf Page 13 of 17 Non-pressure chronic ulcer of other part of right lower leg with fat layer exposed Acute embolism and thrombosis of other specified deep vein of left lower extremity Dementia in other diseases classified elsewhere, unspecified severity, without behavioral disturbance, psychotic disturbance, mood disturbance, and anxiety Acute embolism and thrombosis of other specified deep vein of right lower extremity Non-pressure chronic ulcer of other part of left lower leg with fat layer exposed Non-pressure chronic ulcer of skin of other sites with fat layer exposed Procedures Wound #10 Pre-procedure diagnosis of Wound #10 is a Pressure Ulcer located on the Left T Third . There was a Selective/Open Wound Non-Viable Tissue Debridement oe with a total area of 0.56 sq cm performed by Fredirick Maudlin, MD.  With the following instrument(s): Curette to remove Non-Viable tissue/material. Material removed includes [facility] after achieving pain control using Lidocaine 4% Topical Solution. No specimens were taken. A time out was conducted at 11:50, prior to the start of the procedure. A Minimum amount of bleeding was controlled with Pressure. The procedure was tolerated well with a pain level of 0 throughout and a pain level of 0 following the procedure. Post Debridement Measurements: 0.7cm length x 0.8cm width x 0.1cm depth; 0.044cm^3 volume. Post debridement Stage noted as Category/Stage II. Character of Wound/Ulcer Post Debridement is improved. Post procedure Diagnosis Wound #10: Same as Pre-Procedure General Notes: Scribed for Dr. Celine Ahr by Lenna Sciara.S.. Wound #2 Pre-procedure diagnosis of Wound #2 is a Venous Leg Ulcer located on the Left,Medial Lower Leg .Severity of Tissue Pre Debridement is: Fat layer exposed. There was a Selective/Open Wound Non-Viable Tissue Debridement with a total area of 2.4 sq cm performed by Fredirick Maudlin, MD. With the following instrument(s): Curette to remove Non-Viable tissue/material. Material removed includes [facility] after achieving pain control using Lidocaine 4% Topical Solution. No specimens were taken. A time out was conducted at 11:50, prior to the start of the procedure. A Minimum amount of bleeding was controlled with Pressure. The procedure was tolerated well with a pain level of 0 throughout and a pain level of 0 following the procedure. Post Debridement Measurements: 3cm length x 0.8cm width x 0.1cm depth; 0.188cm^3 volume. Character of Wound/Ulcer Post Debridement is improved. Severity of Tissue Post Debridement is: Fat layer exposed. Post procedure Diagnosis Wound #2: Same as Pre-Procedure General Notes: Scribed for Dr. Celine Ahr by Lenna Sciara.S.. Wound #4 Pre-procedure diagnosis of Wound #4 is a Cellulitis located on the Right,Medial Lower Leg . There was a Selective/Open  Wound Non-Viable Tissue Debridement with a total area of 5.72 sq cm performed by Fredirick Maudlin, MD. With the following instrument(s): Curette to remove Non-Viable tissue/material. Material removed includes [facility] after achieving pain control using Lidocaine 4% T opical Solution. No specimens were taken. A time out was conducted at 11:50, prior to the start of the procedure. A Minimum amount of bleeding was controlled with Pressure. The procedure was tolerated well with a pain level of 0 throughout and a pain level of 0 following the procedure. Post Debridement Measurements: 2.2cm length x 2.6cm width x 0.1cm depth; 0.449cm^3 volume. Character of Wound/Ulcer Post Debridement is improved. Post procedure Diagnosis Wound #4: Same as Pre-Procedure General Notes: Scribed for Dr. Celine Ahr by J.Scotton (  J.S.). Wound #5 Pre-procedure diagnosis of Wound #5 is a Venous Leg Ulcer located on the Left,Lateral Lower Leg .Severity of Tissue Pre Debridement is: Fat layer exposed. There was a Selective/Open Wound Non-Viable Tissue Debridement with a total area of 5.44 sq cm performed by Fredirick Maudlin, MD. With the following instrument(s): Curette to remove Non-Viable tissue/material. Material removed includes [facility] after achieving pain control using Lidocaine 4% Topical Solution. No specimens were taken. A time out was conducted at 11:50, prior to the start of the procedure. A Minimum amount of bleeding was controlled with Pressure. The procedure was tolerated well with a pain level of 0 throughout and a pain level of 0 following the procedure. Post Debridement Measurements: 3.4cm length x 1.6cm width x 0.1cm depth; 0.427cm^3 volume. Character of Wound/Ulcer Post Debridement is improved. Severity of Tissue Post Debridement is: Fat layer exposed. Post procedure Diagnosis Wound #5: Same as Pre-Procedure General Notes: Scribed for Dr. Celine Ahr by J.Scotton. Wound #6 Pre-procedure diagnosis of Wound #6 is a Venous  Leg Ulcer located on the Left,Posterior Lower Leg .Severity of Tissue Pre Debridement is: Fat layer exposed. There was a Selective/Open Wound Non-Viable Tissue Debridement with a total area of 0.28 sq cm performed by Fredirick Maudlin, MD. With the following instrument(s): Curette to remove Non-Viable tissue/material. Material removed includes [facility] after achieving pain control using Lidocaine 4% Topical Solution. No specimens were taken. A time out was conducted at 11:50, prior to the start of the procedure. A Minimum amount of bleeding was controlled with Pressure. The procedure was tolerated well with a pain level of 0 throughout and a pain level of 0 following the procedure. Post Debridement Measurements: 0.4cm length x 0.7cm width x 0.1cm depth; 0.022cm^3 volume. Character of Wound/Ulcer Post Debridement is improved. Severity of Tissue Post Debridement is: Fat layer exposed. Post procedure Diagnosis Wound #6: Same as Pre-Procedure General Notes: Scribed for Dr. Celine Ahr by Lenna Sciara.S.. Wound #7 Pre-procedure diagnosis of Wound #7 is a Trauma, Other located on the Left Wrist . There was a Selective/Open Wound Non-Viable Tissue Debridement with a total area of 1.56 sq cm performed by Fredirick Maudlin, MD. With the following instrument(s): Curette to remove Non-Viable tissue/material. Material removed includes [facility] after achieving pain control using Lidocaine 4% Topical Solution. No specimens were taken. A time out was conducted at 11:50, prior to the start of the procedure. A Minimum amount of bleeding was controlled with Pressure. The procedure was tolerated well with a pain level of 0 throughout and a pain level of 0 following the procedure. Post Debridement Measurements: 1.3cm length x 1.2cm width x 0.1cm depth; 0.123cm^3 volume. Character of Wound/Ulcer Post Debridement is improved. Post procedure Diagnosis Wound #7: Same as Pre-Procedure General Notes: Scribed for Dr. Celine Ahr by J.Scotton. Wound  #8 Pre-procedure diagnosis of Wound #8 is a Trauma, Other located on the Right,Lateral Knee . There was a Selective/Open Wound Non-Viable Tissue Debridement with a total area of 2.8 sq cm performed by Fredirick Maudlin, MD. With the following instrument(s): Curette to remove Non-Viable tissue/material. Material removed includes [facility] after achieving pain control using Lidocaine 4% Topical Solution. No specimens were taken. A time out was conducted at 11:50, prior to the start of the procedure. A Minimum amount of bleeding was controlled with Pressure. The procedure was tolerated well with a pain level of 0 throughout and a pain level of 0 following the procedure. Post Debridement Measurements: 2.8cm length x 1cm width x 0.1cm depth; 0.22cm^3 volume. Character of Wound/Ulcer Post Debridement is  improved. Post procedure Diagnosis Wound #8: Same as Pre-Procedure General Notes: Scribed for Dr. Celine Ahr by Lenna Sciara.S.. Wound #9 Pre-procedure diagnosis of Wound #9 is a Trauma, Other located on the Left,Posterior Knee . There was a Selective/Open Wound Non-Viable Tissue JORDEN, PENDERGAST ([id number]) [phone].pdf Page 14 of 17 Debridement with a total area of 21.2 sq cm performed by Fredirick Maudlin, MD. With the following instrument(s): Curette to remove Non-Viable tissue/material. Material removed includes [facility] after achieving pain control using Lidocaine 4% Topical Solution. No specimens were taken. A time out was conducted at 11:50, prior to the start of the procedure. A Minimum amount of bleeding was controlled with Pressure. The procedure was tolerated well with a pain level of 0 throughout and a pain level of 0 following the procedure. Post Debridement Measurements: 5.3cm length x 4cm width x 0.1cm depth; 1.665cm^3 volume. Character of Wound/Ulcer Post Debridement is improved. Post procedure Diagnosis Wound #9: Same as Pre-Procedure General Notes: Scribed for Dr. Celine Ahr by  J.Scotton. Plan Follow-up Appointments: Return Appointment in 2 weeks. - Dr. Celine Ahr Room 3 Anesthetic: Wound #2 Left,Medial Lower Leg: (In clinic) Topical Lidocaine 5% applied to wound bed Bathing/ Shower/ Hygiene: May shower and wash wound with soap and water. Edema Control - Lymphedema / SCD / Other: Avoid standing for long periods of time. WOUND #10: - T Third Wound Laterality: Left oe Cleanser: Soap and Water 1 x Per Day/30 Days Discharge Instructions: May shower and wash wound with dial antibacterial soap and water prior to dressing change. Peri-Wound Care: Sween Lotion (Moisturizing lotion) 1 x Per Day/30 Days Discharge Instructions: Apply moisturizing lotion as directed Prim Dressing: Sorbalgon AG Dressing, 4x4 (in/in) 1 x Per Day/30 Days ary Discharge Instructions: Apply to wound bed as instructed Com pression Wrap: Kerlix Roll 4.5x3.1 (in/yd) 1 x Per Day/30 Days Discharge Instructions: Apply Kerlix and Coban compression as directed. Com pression Wrap: Coban Self-Adherent Wrap 4x5 (in/yd) 1 x Per Day/30 Days Discharge Instructions: Apply over Kerlix as directed. WOUND #2: - Lower Leg Wound Laterality: Left, Medial Cleanser: Soap and Water 1 x Per Day/30 Days Discharge Instructions: May shower and wash wound with dial antibacterial soap and water prior to dressing change. Peri-Wound Care: Sween Lotion (Moisturizing lotion) 1 x Per Day/30 Days Discharge Instructions: Apply moisturizing lotion as directed Prim Dressing: Sorbalgon AG Dressing, 4x4 (in/in) 1 x Per Day/30 Days ary Discharge Instructions: Apply to wound bed as instructed Com pression Wrap: Kerlix Roll 4.5x3.1 (in/yd) 1 x Per Day/30 Days Discharge Instructions: Apply Kerlix and Coban compression as directed. Com pression Wrap: Coban Self-Adherent Wrap 4x5 (in/yd) 1 x Per Day/30 Days Discharge Instructions: Apply over Kerlix as directed. WOUND #4: - Lower Leg Wound Laterality: Right, Medial Cleanser: Soap and  Water 1 x Per Day/30 Days Discharge Instructions: May shower and wash wound with dial antibacterial soap and water prior to dressing change. Peri-Wound Care: Sween Lotion (Moisturizing lotion) 1 x Per Day/30 Days Discharge Instructions: Apply moisturizing lotion as directed Prim Dressing: Sorbalgon AG Dressing, 4x4 (in/in) 1 x Per Day/30 Days ary Discharge Instructions: Apply to wound bed as instructed Com pression Wrap: Kerlix Roll 4.5x3.1 (in/yd) 1 x Per Day/30 Days Discharge Instructions: Apply Kerlix and Coban compression as directed. Com pression Wrap: Coban Self-Adherent Wrap 4x5 (in/yd) 1 x Per Day/30 Days Discharge Instructions: Apply over Kerlix as directed. WOUND #5: - Lower Leg Wound Laterality: Left, Lateral Cleanser: Soap and Water 1 x Per Day/30 Days Discharge Instructions: May shower and wash wound with dial antibacterial  soap and water prior to dressing change. Peri-Wound Care: Sween Lotion (Moisturizing lotion) 1 x Per Day/30 Days Discharge Instructions: Apply moisturizing lotion as directed Prim Dressing: Sorbalgon AG Dressing, 4x4 (in/in) 1 x Per Day/30 Days ary Discharge Instructions: Apply to wound bed as instructed Com pression Wrap: Kerlix Roll 4.5x3.1 (in/yd) 1 x Per Day/30 Days Discharge Instructions: Apply Kerlix and Coban compression as directed. Com pression Wrap: Coban Self-Adherent Wrap 4x5 (in/yd) 1 x Per Day/30 Days Discharge Instructions: Apply over Kerlix as directed. WOUND #6: - Lower Leg Wound Laterality: Left, Posterior Cleanser: Soap and Water 1 x Per Day/30 Days Discharge Instructions: May shower and wash wound with dial antibacterial soap and water prior to dressing change. Peri-Wound Care: Sween Lotion (Moisturizing lotion) 1 x Per Day/30 Days Discharge Instructions: Apply moisturizing lotion as directed Prim Dressing: Sorbalgon AG Dressing, 4x4 (in/in) 1 x Per Day/30 Days ary Discharge Instructions: Apply to wound bed as instructed Com pression  Wrap: Kerlix Roll 4.5x3.1 (in/yd) 1 x Per Day/30 Days Discharge Instructions: Apply Kerlix and Coban compression as directed. Com pression Wrap: Coban Self-Adherent Wrap 4x5 (in/yd) 1 x Per Day/30 Days Discharge Instructions: Apply over Kerlix as directed. WOUND #7: - Wrist Wound Laterality: Left Cleanser: Soap and Water 1 x Per Day/30 Days Discharge Instructions: May shower and wash wound with dial antibacterial soap and water prior to dressing change. Peri-Wound Care: Sween Lotion (Moisturizing lotion) 1 x Per Day/30 Days Discharge Instructions: Apply moisturizing lotion as directed Prim Dressing: Sorbalgon AG Dressing, 4x4 (in/in) 1 x Per Day/30 Days ary Discharge Instructions: Apply to wound bed as instructed Com pression Wrap: Kerlix Roll 4.5x3.1 (in/yd) 1 x Per Day/30 Days Discharge Instructions: Apply Kerlix and Coban compression as directed. Com pression Wrap: Coban Self-Adherent Wrap 4x5 (in/yd) 1 x Per Day/30 Days Discharge Instructions: Apply over Kerlix as directed. WOUND #8: - Knee Wound Laterality: Right, Lateral ANNICE, NIBBE Mitchell ([id number]) [phone].pdf Page 15 of 17 Cleanser: Soap and Water 1 x Per Day/30 Days Discharge Instructions: May shower and wash wound with dial antibacterial soap and water prior to dressing change. Peri-Wound Care: Sween Lotion (Moisturizing lotion) 1 x Per Day/30 Days Discharge Instructions: Apply moisturizing lotion as directed Prim Dressing: Sorbalgon AG Dressing, 4x4 (in/in) 1 x Per Day/30 Days ary Discharge Instructions: Apply to wound bed as instructed Com pression Wrap: Kerlix Roll 4.5x3.1 (in/yd) 1 x Per Day/30 Days Discharge Instructions: Apply Kerlix and Coban compression as directed. Com pression Wrap: Coban Self-Adherent Wrap 4x5 (in/yd) 1 x Per Day/30 Days Discharge Instructions: Apply over Kerlix as directed. WOUND #9: - Knee Wound Laterality: Left, Posterior Cleanser: Soap and Water 1 x Per Day/30  Days Discharge Instructions: May shower and wash wound with dial antibacterial soap and water prior to dressing change. Peri-Wound Care: Sween Lotion (Moisturizing lotion) 1 x Per Day/30 Days Discharge Instructions: Apply moisturizing lotion as directed Prim Dressing: Sorbalgon AG Dressing, 4x4 (in/in) 1 x Per Day/30 Days ary Discharge Instructions: Apply to wound bed as instructed Com pression Wrap: Kerlix Roll 4.5x3.1 (in/yd) 1 x Per Day/30 Days Discharge Instructions: Apply Kerlix and Coban compression as directed. Com pression Wrap: Coban Self-Adherent Wrap 4x5 (in/yd) 1 x Per Day/30 Days Discharge Instructions: Apply over Kerlix as directed. 05/23/2022: She has yet another new wound on her right lower leg just lateral to and below her knee this has been Steri-Stripped. Her right fourth toe is bright red, tender, and swollen, but there is no obvious wound. All of her other wounds look about  the same. There is some slough accumulation on the surfaces. I used a curette to debride slough off of all of the open wound surfaces. I did not disturb the Steri-Strips on her new wound. I recommended to her care facility that they obtain an x-ray of her fourth toe. Continue silver alginate with Kerlix and Coban wrapping. Follow-up in 2 weeks. Electronic Signature(s) Signed: 06/04/2022 2:19:36 PM By: Deon Pilling RN, BSN Signed: 06/12/2022 5:14:33 PM By: Fredirick Maudlin MD FACS Previous Signature: 05/23/2022 12:00:13 PM Version By: Fredirick Maudlin MD FACS Entered By: Deon Pilling on 06/04/2022 13:55:27 -------------------------------------------------------------------------------- HxROS Details Patient Name: Date of Service: Cynthia [name], EV ELYN Mitchell. 05/23/2022 11:00 A M Medical Record Number: [id number] Patient Account Number: [id number] Date of Birth/Sex: Treating RN: 1931-07-20 (89 y.o. F) Primary Care Provider: Deland Pretty Other Clinician: Referring Provider: Treating Provider/Extender: Raylene Everts in Treatment: 11 Information Obtained From Patient Eyes Medical History: Negative for: Cataracts; Glaucoma; Optic Neuritis Ear/Nose/Mouth/Throat Medical History: Negative for: Chronic sinus problems/congestion; Middle ear problems Hematologic/Lymphatic Medical History: Negative for: Anemia; Hemophilia; Human Immunodeficiency Virus; Lymphedema; Sickle Cell Disease Respiratory Medical History: Negative for: Aspiration; Asthma; Chronic Obstructive Pulmonary Disease (COPD); Pneumothorax; Sleep Apnea; Tuberculosis Cardiovascular Medical History: Positive for: Hypertension Gastrointestinal OPLE, ISHIBASHI ([id number]) [phone].pdf Page 16 of 17 Medical History: Negative for: Cirrhosis ; Colitis; Crohns; Hepatitis A; Hepatitis B Endocrine Medical History: Negative for: Type I Diabetes; Type II Diabetes Genitourinary Medical History: Negative for: End Stage Renal Disease Past Medical History Notes: polyneuropathy Integumentary (Skin) Medical History: Negative for: History of Burn Musculoskeletal Medical History: Negative for: Gout; Rheumatoid Arthritis; Osteoarthritis; Osteomyelitis Neurologic Medical History: Positive for: Dementia Negative for: Neuropathy; Paraplegia; Seizure Disorder Psychiatric Medical History: Negative for: Anorexia/bulimia; Confinement Anxiety Immunizations Pneumococcal Vaccine: Received Pneumococcal Vaccination: Yes Received Pneumococcal Vaccination On or After 60th Birthday: Yes Implantable Devices None Hospitalization / Surgery History Type of Hospitalization/Surgery Orif 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 Breast surgery- unknown Family and Social History Unknown History: Yes; Never smoker; Marital Status - Married; Alcohol Use: Never; Drug Use: No History; Caffeine Use: Rarely; Financial Concerns: No; Food, Clothing or Shelter Needs: No; Support System Lacking: No;  Transportation Concerns: No Electronic Signature(s) Signed: 05/23/2022 12:09:56 PM By: Fredirick Maudlin MD FACS Entered By: Fredirick Maudlin on 05/23/2022 11:58:45 -------------------------------------------------------------------------------- SuperBill Details Patient Name: Date of Service: Cynthia [name], [name] 05/23/2022 Medical Record Number: [id number] Patient Account Number: [id number] Date of Birth/Sex: Treating RN: 05-01-1931 (89 y.o. F) Primary Care Provider: Deland Pretty Other Clinician: Referring Provider: Treating Provider/Extender: Raylene Everts in Treatment: 11 Diagnosis Coding ICD-10 Codes Code Description JORDY, CARONIA ([id number]) [id number] Page 17 of [phone] Non-pressure chronic ulcer of other part of right lower leg with fat layer exposed I82.492 Acute embolism and thrombosis of other specified deep vein of left lower extremity Dementia in other diseases classified elsewhere, unspecified severity, without behavioral disturbance, psychotic disturbance, mood F02.80 disturbance, and anxiety I82.491 Acute embolism and thrombosis of other specified deep vein of right lower extremity L97.822 Non-pressure chronic ulcer of other part of left lower leg with fat layer exposed L98.492 Non-pressure chronic ulcer of skin of other sites with fat layer exposed Facility Procedures : CPT4 Code: [id number] Description: [id number] - DEBRIDE WOUND 1ST 20 SQ CM OR < ICD-10 Diagnosis Description L97.812 Non-pressure chronic ulcer of other part of right lower leg with fat layer expos L97.822 Non-pressure chronic ulcer of other part of left lower leg with fat layer  expose L98.492 Non-pressure chronic ulcer of skin of other sites with fat layer exposed Modifier: ed d Quantity: 1 Physician Procedures : CPT4: Description Modifier Code [id number] - WC PHYS LEVEL 3 - EST PT 25 ICD-10 Diagnosis Description [id number] Non-pressure chronic ulcer  of other part of right lower leg with fat layer exposed L97.822 Non-pressure chronic ulcer of other part of left  lower leg with fat layer exposed L98.492 Non-pressure chronic ulcer of skin of other sites with fat layer exposed F02.80 Dementia in other diseases classified elsewhere, unspecified severity, without behavioral disturbance, p disturbance, mood  disturbance, and anxiety Quantity: 1 sychotic : [id number] - WC PHYS DEBR WO ANESTH 20 SQ CM ICD-10 Diagnosis Description [id number] Non-pressure chronic ulcer of other part of right lower leg with fat layer exposed L97.822 Non-pressure chronic ulcer of other part of left lower leg with fat  layer exposed L98.492 Non-pressure chronic ulcer of skin of other sites with fat layer exposed Quantity: 1 Electronic Signature(s) Signed: 05/23/2022 12:00:43 PM By: Fredirick Maudlin MD FACS Entered By: Fredirick Maudlin on 05/23/2022 12:00:43

## 2022-05-25 DIAGNOSIS — Z48817 Encounter for surgical aftercare following surgery on the skin and subcutaneous tissue: Secondary | ICD-10-CM | POA: Diagnosis not present

## 2022-05-31 DIAGNOSIS — Z85810 Personal history of malignant neoplasm of tongue: Secondary | ICD-10-CM | POA: Diagnosis not present

## 2022-05-31 DIAGNOSIS — L570 Actinic keratosis: Secondary | ICD-10-CM | POA: Diagnosis not present

## 2022-05-31 DIAGNOSIS — L821 Other seborrheic keratosis: Secondary | ICD-10-CM | POA: Diagnosis not present

## 2022-06-06 ENCOUNTER — Encounter (HOSPITAL_BASED_OUTPATIENT_CLINIC_OR_DEPARTMENT_OTHER): Payer: Medicare PPO | Admitting: General Surgery

## 2022-06-06 DIAGNOSIS — L97812 Non-pressure chronic ulcer of other part of right lower leg with fat layer exposed: Secondary | ICD-10-CM | POA: Diagnosis not present

## 2022-06-06 DIAGNOSIS — Z515 Encounter for palliative care: Secondary | ICD-10-CM | POA: Diagnosis not present

## 2022-06-06 DIAGNOSIS — I872 Venous insufficiency (chronic) (peripheral): Secondary | ICD-10-CM | POA: Diagnosis not present

## 2022-06-06 DIAGNOSIS — L03115 Cellulitis of right lower limb: Secondary | ICD-10-CM | POA: Diagnosis not present

## 2022-06-06 DIAGNOSIS — B001 Herpesviral vesicular dermatitis: Secondary | ICD-10-CM | POA: Diagnosis not present

## 2022-06-06 DIAGNOSIS — Z86718 Personal history of other venous thrombosis and embolism: Secondary | ICD-10-CM | POA: Diagnosis not present

## 2022-06-06 DIAGNOSIS — S91001A Unspecified open wound, right ankle, initial encounter: Secondary | ICD-10-CM | POA: Diagnosis not present

## 2022-06-06 DIAGNOSIS — Z66 Do not resuscitate: Secondary | ICD-10-CM | POA: Diagnosis not present

## 2022-06-06 DIAGNOSIS — L97822 Non-pressure chronic ulcer of other part of left lower leg with fat layer exposed: Secondary | ICD-10-CM | POA: Diagnosis not present

## 2022-06-06 DIAGNOSIS — L98492 Non-pressure chronic ulcer of skin of other sites with fat layer exposed: Secondary | ICD-10-CM | POA: Diagnosis not present

## 2022-06-07 NOTE — Progress Notes (Signed)
[name], [name] ([id number]) [id number] Page 1 of 18 Visit Report for 06/06/2022 Arrival Information Details Patient Name: Date of Service: Cynthia Phill Mutter 06/06/2022 11:00 A M Medical Record Number: [id number] Patient Account Number: [id number] Date of Birth/Sex: Treating RN: [birth year] (89 y.o. Valarie Cones, Mechele Claude Primary Care Kaetlyn Noa: Deland Pretty Other Clinician: Referring Nathaly Dawkins: Treating Diera Wirkkala/Extender: Raylene Everts in Treatment: 13 Visit Information History Since Last Visit Added or deleted any medications: No Patient Arrived: Wheel Chair Any new allergies or adverse reactions: No Arrival Time: 11:15 Had a fall or experienced change in No Accompanied By: spouse activities of daily living that may affect Transfer Assistance: Manual risk of falls: Patient Identification Verified: Yes Signs or symptoms of abuse/neglect since last visito No Patient Requires Transmission-Based Precautions: No Hospitalized since last visit: No Patient Has Alerts: No Implantable device outside of the clinic excluding No cellular tissue based products placed in the center since last visit: Has Dressing in Place as Prescribed: Yes Has Compression in Place as Prescribed: Yes Pain Present Now: Yes Electronic Signature(s) Signed: 06/06/2022 5:21:05 PM By: Dellie Catholic RN Entered By: Dellie Catholic on 06/06/2022 11:41:54 -------------------------------------------------------------------------------- Encounter Discharge Information Details Patient Name: Date of Service: [name], [name]. 06/06/2022 11:00 A M Medical Record Number: [id number] Patient Account Number: [id number] Date of Birth/Sex: Treating RN: [birth year] (89 y.o. [name] Primary Care Bayne Fosnaugh: Deland Pretty Other Clinician: Referring Emiah Pellicano: Treating Timithy Arons/Extender: Raylene Everts in Treatment: 13 Encounter Discharge Information  Items Post Procedure Vitals Discharge Condition: Stable Temperature (F): 97.4 Ambulatory Status: Wheelchair Pulse (bpm): 94 Discharge Destination: [place] Respiratory Rate (breaths/min): 18 Telephoned: No Blood Pressure (mmHg): 152/81 Orders Sent: Yes Transportation: Private Auto Accompanied By: spouse Schedule Follow-up Appointment: Yes Clinical Summary of Care: Patient Declined Electronic Signature(s) Signed: 06/06/2022 5:21:05 PM By: Dellie Catholic RN Entered By: Dellie Catholic on 06/06/2022 17:02:49 Haapala, [name] ([id number].pdf Page 2 of 18 -------------------------------------------------------------------------------- Lower Extremity Assessment Details Patient Name: Date of Service: Cynthia Phill Mutter 06/06/2022 11:00 A M Medical Record Number: [id number] Patient Account Number: [id number] Date of Birth/Sex: Treating RN: 02/17/1932 (89 y.o. [name] Primary Care Elliyah Liszewski: Deland Pretty Other Clinician: Referring Kaysie Michelini: Treating Enisa Runyan/Extender: Raylene Everts in Treatment: 13 Edema Assessment Assessed: Shirlyn Goltz: No] Patrice Paradise: No] [Left: Edema] [Right: :] Calf Left: Right: Point of Measurement: From Medial Instep 27.5 cm 27.5 cm Ankle Left: Right: Point of Measurement: From Medial Instep 19.5 cm 20 cm Vascular Assessment Pulses: Dorsalis Pedis Palpable: [Left:Yes] [Right:Yes] Electronic Signature(s) Signed: 06/06/2022 5:21:05 PM By: Dellie Catholic RN Entered By: Dellie Catholic on 06/06/2022 11:44:04 -------------------------------------------------------------------------------- Multi Wound Chart Details Patient Name: Date of Service: [name], [name]. 06/06/2022 11:00 A M Medical Record Number: [id number] Patient Account Number: [id number] Date of Birth/Sex: Treating RN: 07/15/1931 (89 y.o. F) Primary Care Lakara Weiland: Deland Pretty Other Clinician: Referring  Kalysta Kneisley: Treating Kayly Kriegel/Extender: Raylene Everts in Treatment: 13 Vital Signs Height(in): 66 Pulse(bpm): 94 Weight(lbs): 133 Blood Pressure(mmHg): 152/81 Body Mass Index(BMI): 21.5 Temperature(F): 97.4 Respiratory Rate(breaths/min): 18 [10:Photos:] [id number].pdf Page 3 of 18] Left T Third oe Left, Medial Lower Leg Right, Medial Lower Leg Wound Location: Blister Skin Tear/Laceration Blister Wounding Event: Pressure Ulcer Venous Leg Ulcer Cellulitis Primary Etiology: Hypertension, Dementia Hypertension, Dementia Hypertension, Dementia Comorbid History: 05/12/2022 02/04/2022 04/18/2022 Date Acquired: 3 13 6 $ Weeks of Treatment: Open Open Open Wound Status: No No No Wound Recurrence: No Yes No Clustered Wound: 0.3x0.3x0.1  2.9x0.8x0.1 1.9x2.6x0.1 Measurements L x W x D (cm) 0.071 1.822 3.88 A (cm) : rea 0.007 0.182 0.388 Volume (cm) : -129.00% 79.90% 48.00% % Reduction in A rea: -133.30% 79.90% 48.00% % Reduction in Volume: Category/Stage II Full Thickness Without Exposed Full Thickness Without Exposed Classification: Support Structures Support Structures Medium None Present Medium Exudate A mount: Serosanguineous N/A Serosanguineous Exudate Type: red, brown N/A red, brown Exudate Color: Small (1-33%) Large (67-100%) Small (1-33%) Granulation A mount: Red Red, Hyper-granulation, Friable Red, Pink Granulation Quality: Large (67-100%) Small (1-33%) Large (67-100%) Necrotic A mount: Eschar, Adherent Slough Eschar Eschar, Adherent Slough Necrotic Tissue: Fat Layer (Subcutaneous Tissue): Yes N/A N/A Exposed Structures: Fascia: No Tendon: No Muscle: No Joint: No Bone: No Small (1-33%) Small (1-33%) Small (1-33%) Epithelialization: N/A Debridement - Selective/Open Wound Debridement - Selective/Open Wound Debridement: Pre-procedure Verification/Time Out N/A 11:41 11:41 Taken: N/A Lidocaine 4% Topical  Solution Lidocaine 4% Topical Solution Pain Control: N/A Slough N/A Tissue Debrided: N/A Non-Viable Tissue Skin/Epidermis Level: N/A 2.32 4.94 Debridement A (sq cm): rea N/A Curette Curette Instrument: N/A Minimum Minimum Bleeding: N/A Pressure Pressure Hemostasis A chieved: N/A Procedure was tolerated well Procedure was tolerated well Debridement Treatment Response: N/A 2.9x0.8x0.1 1.9x2.6x0.1 Post Debridement Measurements L x W x D (cm) N/A 0.182 0.388 Post Debridement Volume: (cm) No Abnormalities Noted No Abnormalities Noted No Abnormalities Noted Periwound Skin Texture: No Abnormalities Noted Maceration: No Maceration: No Periwound Skin Moisture: Dry/Scaly: No Dry/Scaly: No No Abnormalities Noted No Abnormalities Noted Rubor: Yes Periwound Skin Color: No Abnormality No Abnormality No Abnormality Temperature: N/A Debridement Debridement Procedures Performed: Wound Number: 5 6 7 $ Photos: Left, Lateral Lower Leg Left, Posterior Lower Leg Left Wrist Wound Location: Blister Blister Hematoma Wounding Event: Venous Leg Ulcer Venous Leg Ulcer Trauma, Other Primary Etiology: Hypertension, Dementia Hypertension, Dementia Hypertension, Dementia Comorbid History: 04/17/2022 04/17/2022 05/12/2022 Date Acquired: 6 6 3 $ Weeks of Treatment: Open Open Open Wound Status: No No No Wound Recurrence: No No No Clustered Wound: 3.2x1.4x0.1 0.3x0.7x0.1 0.1x0.1x0.1 Measurements L x W x D (cm) 3.519 0.165 0.008 A (cm) : rea 0.352 0.016 0.001 Volume (cm) : 6.70% 73.70% 99.50% % Reduction in A rea: 6.60% 74.60% 99.40% % Reduction in Volume: Full Thickness Without Exposed Full Thickness Without Exposed Full Thickness Without Exposed Classification: Support Structures Support Structures Support Structures None Present None Present Medium Exudate Amount: N/A N/A Serosanguineous Exudate Type: N/A N/A red, brown Exudate Color: Small (1-33%) Small (1-33%) None Present  (0%) Granulation Amount: Red, Hyper-granulation Red, Hyper-granulation N/A Granulation Quality: Large (67-100%) Large (67-100%) Large (67-100%) Necrotic Amount: IRIDESSA, HEIDEBRINK ([id number]) [id number].pdf Page 4 of 18 Eschar, Adherent [facility], Adherent [facility] Necrotic Tissue: N/A Fat Layer (Subcutaneous Tissue): Yes Fat Layer (Subcutaneous Tissue): Yes Exposed Structures: Fascia: No Tendon: No Muscle: No Joint: No Bone: No None Small (1-33%) None Epithelialization: Debridement - Selective/Open Wound N/A N/A Debridement: Pre-procedure Verification/Time Out 11:41 N/A N/A Taken: Lidocaine 4% Topical Solution N/A N/A Pain Control: Slough N/A N/A Tissue Debrided: Non-Viable Tissue N/A N/A Level: 4.48 N/A N/A Debridement A (sq cm): rea Curette N/A N/A Instrument: Minimum N/A N/A Bleeding: Pressure N/A N/A Hemostasis A chieved: Procedure was tolerated well N/A N/A Debridement Treatment Response: 3.2x1.4x0.1 N/A N/A Post Debridement Measurements L x W x D (cm) 0.352 N/A N/A Post Debridement Volume: (cm) No Abnormalities Noted No Abnormalities Noted No Abnormalities Noted Periwound Skin Texture: Maceration: No No Abnormalities Noted Periwound Skin Moisture: Dry/Scaly: No Rubor: Yes Rubor: Yes No Abnormalities Noted Periwound Skin Color:  No Abnormality N/A No Abnormality Temperature: Debridement N/A N/A Procedures Performed: Wound Number: 8 9 N/A Photos: N/A Right, Lateral Knee Left, Posterior Knee N/A Wound Location: Skin T ear/Laceration Skin T ear/Laceration N/A Wounding Event: Trauma, Other Trauma, Other N/A Primary Etiology: Hypertension, Dementia Hypertension, Dementia N/A Comorbid History: 05/12/2022 05/11/2022 N/A Date Acquired: 3 3 N/A Weeks of Treatment: Open Open N/A Wound Status: No No N/A Wound Recurrence: No No N/A Clustered Wound: 6x3x0.1 4.8x3x0.1 N/A Measurements L x W x D (cm) 14.137 11.31  N/A A (cm) : rea 1.414 1.131 N/A Volume (cm) : -3904.80% 52.00% N/A % Reduction in A rea: -3940.00% 52.00% N/A % Reduction in Volume: Full Thickness Without Exposed Full Thickness Without Exposed N/A Classification: Support Structures Support Structures Medium Medium N/A Exudate A mount: Serosanguineous Serosanguineous N/A Exudate Type: red, brown red, brown N/A Exudate Color: Small (1-33%) Small (1-33%) N/A Granulation A mount: Pink Red, Hyper-granulation N/A Granulation Quality: Large (67-100%) Large (67-100%) N/A Necrotic A mount: Eschar, Adherent Slough Eschar, Adherent Slough N/A Necrotic Tissue: Fat Layer (Subcutaneous Tissue): Yes Fascia: No N/A Exposed Structures: Fascia: No Fat Layer (Subcutaneous Tissue): No Tendon: No Tendon: No Muscle: No Muscle: No Joint: No Joint: No Bone: No Bone: No Small (1-33%) Small (1-33%) N/A Epithelialization: Debridement - Selective/Open Wound N/A N/A Debridement: Pre-procedure Verification/Time Out 11:41 N/A N/A Taken: Lidocaine 4% Topical Solution N/A N/A Pain Control: Slough N/A N/A Tissue Debrided: Non-Viable Tissue N/A N/A Level: 18 N/A N/A Debridement A (sq cm): rea Curette N/A N/A Instrument: Minimum N/A N/A Bleeding: Pressure N/A N/A Hemostasis A chieved: Procedure was tolerated well N/A N/A Debridement Treatment Response: 6x3x0.1 N/A N/A Post Debridement Measurements L x W x D (cm) 1.414 N/A N/A Post Debridement Volume: (cm) No Abnormalities Noted No Abnormalities Noted N/A Periwound Skin Texture: No Abnormalities Noted No Abnormalities Noted N/A Periwound Skin MoistureSHAMEEKA, [name] ([id number]) [phone].pdf Page 5 of 18 No Abnormalities Noted No Abnormalities Noted N/A Periwound Skin Color: No Abnormality No Abnormality N/A Temperature: Debridement N/A N/A Procedures Performed: Treatment Notes Electronic Signature(s) Signed: 06/06/2022 12:01:39 PM By:  Fredirick Maudlin MD FACS Entered By: Fredirick Maudlin on 06/06/2022 12:01:39 -------------------------------------------------------------------------------- Multi-Disciplinary Care Plan Details Patient Name: Date of Service: [name], [name]. 06/06/2022 11:00 A M Medical Record Number: [id number] Patient Account Number: [id number] Date of Birth/Sex: Treating RN: May 27, 1931 (89 y.o. [name] Primary Care Bellamarie Pflug: Deland Pretty Other Clinician: Referring Trinadee Verhagen: Treating Hilary Milks/Extender: Raylene Everts in Treatment: 13 Active Inactive Orientation to the Wound Care Program Nursing Diagnoses: Knowledge deficit related to the wound healing center program Goals: Patient/caregiver will verbalize understanding of the [facility] Date Initiated: 03/01/2022 Target Resolution Date: 05/17/2023 Goal Status: Active Interventions: Provide education on orientation to the wound center Notes: Venous Leg Ulcer Nursing Diagnoses: Potential for venous Insuffiency (use before diagnosis confirmed) Goals: Patient will maintain optimal edema control Date Initiated: 03/01/2022 Target Resolution Date: 05/12/2023 Goal Status: Active Interventions: Provide education on venous insufficiency Treatment Activities: Therapeutic compression applied : 03/01/2022 Notes: Wound/Skin Impairment Nursing Diagnoses: Knowledge deficit related to ulceration/compromised skin integrity Goals: Ulcer/skin breakdown will have a volume reduction of 30% by week 4 Date Initiated: 03/01/2022 Date Inactivated: 03/31/2022 Target Resolution Date: 03/29/2022 Goal Status: Met Ulcer/skin breakdown will have a volume reduction of 50% by week 8 Date Initiated: 03/31/2022 Target Resolution Date: 05/13/2023 Goal Status: Active [name], [name] ([id number]) [phone].pdf Page 6 of 18 Interventions: Assess ulceration(s) every visit Treatment  Activities: Topical wound management  initiated : 03/01/2022 Notes: Electronic Signature(s) Signed: 06/06/2022 5:21:05 PM By: Dellie Catholic RN Entered By: Dellie Catholic on 06/06/2022 11:47:32 -------------------------------------------------------------------------------- Pain Assessment Details Patient Name: Date of Service: Cynthia RDSCathlean Mitchell 06/06/2022 11:00 A M Medical Record Number: [id number] Patient Account Number: [id number] Date of Birth/Sex: Treating RN: 05/09/1931 (89 y.o. [name] Primary Care Zephaniah Lubrano: Deland Pretty Other Clinician: Referring Jorah Hua: Treating Kao Berkheimer/Extender: Raylene Everts in Treatment: 13 Active Problems Location of Pain Severity and Description of Pain Patient Has Paino Yes Site Locations Pain Location: Generalized Pain With Dressing Change: Yes Duration of the Pain. Constant / Intermittento Constant Rate the pain. Current Pain Level: 5 Worst Pain Level: 9 Least Pain Level: 2 Tolerable Pain Level: 5 Character of Pain Describe the Pain: Difficult to Pinpoint Pain Management and Medication Current Pain Management: Medication: Yes Cold Application: No Rest: Yes Massage: No Activity: No T.E.N.S.: No Heat Application: No Leg drop or elevation: No Is the Current Pain Management Adequate: Adequate How does your wound impact your activities of daily livingo Sleep: No Bathing: No Appetite: No Relationship With Others: No Bladder Continence: No Emotions: No Bowel Continence: No Work: No Toileting: No Drive: No Dressing: No Hobbies: No Electronic Signature(s) Signed: 06/06/2022 5:21:05 PM By: Dellie Catholic RN [name], [name] ([id number]) By: Dellie Catholic RN [phone].pdf Page 7 of 18 Signed: 06/06/2022 5:21:05 PM Entered By: Dellie Catholic on 06/06/2022 11:43:40 -------------------------------------------------------------------------------- Wound Assessment  Details Patient Name: Date of Service: Cynthia Phill Mutter 06/06/2022 11:00 A M Medical Record Number: [id number] Patient Account Number: [id number] Date of Birth/Sex: Treating RN: [birth year] (89 y.o. [name] Primary Care Chinyere Galiano: Deland Pretty Other Clinician: Referring Trevante Tennell: Treating Armenia Silveria/Extender: Raylene Everts in Treatment: 13 Wound Status Wound Number: 10 Primary Etiology: Pressure Ulcer Wound Location: Left T Third oe Wound Status: Open Wounding Event: Blister Comorbid History: Hypertension, Dementia Date Acquired: 05/12/2022 Weeks Of Treatment: 3 Clustered Wound: No Photos Wound Measurements Length: (cm) 0.3 Width: (cm) 0.3 Depth: (cm) 0.1 Area: (cm) 0.071 Volume: (cm) 0.007 % Reduction in Area: -129% % Reduction in Volume: -133.3% Epithelialization: Small (1-33%) Tunneling: No Undermining: No Wound Description Classification: Category/Stage II Exudate Amount: Medium Exudate Type: Serosanguineous Exudate Color: red, brown Foul Odor After Cleansing: No Slough/Fibrino No Wound Bed Granulation Amount: Small (1-33%) Exposed Structure Granulation Quality: Red Fascia Exposed: No Necrotic Amount: Large (67-100%) Fat Layer (Subcutaneous Tissue) Exposed: Yes Necrotic Quality: Eschar, Adherent Slough Tendon Exposed: No Muscle Exposed: No Joint Exposed: No Bone Exposed: No Periwound Skin Texture Texture Color No Abnormalities Noted: Yes No Abnormalities Noted: Yes Moisture Temperature / Pain No Abnormalities Noted: Yes Temperature: No Abnormality Treatment Notes Wound #10 (Toe Third) Wound Laterality: Left AMITIEL, CRONIN Mitchell ([id number].pdf Page 8 of 18 Cleanser Soap and Water Discharge Instruction: May shower and wash wound with dial antibacterial soap and water prior to dressing change. Peri-Wound Care Sween Lotion (Moisturizing lotion) Discharge Instruction: Apply moisturizing  lotion as directed Topical Primary Dressing Sorbalgon AG Dressing, 4x4 (in/in) Discharge Instruction: Apply to wound bed as instructed Secondary Dressing Secured With Compression Wrap Kerlix Roll 4.5x3.1 (in/yd) Discharge Instruction: Apply Kerlix and Coban compression as directed. Coban Self-Adherent Wrap 4x5 (in/yd) Discharge Instruction: Apply over Kerlix as directed. Compression Stockings Add-Ons Electronic Signature(s) Signed: 06/06/2022 5:21:05 PM By: Dellie Catholic RN Entered By: Dellie Catholic on 06/06/2022 11:30:24 -------------------------------------------------------------------------------- Wound Assessment Details Patient Name: Date of Service: [name], [name] 06/06/2022 11:00 A M Medical Record Number: [id number]  Patient Account Number: [id number] Date of Birth/Sex: Treating RN: [birth year] (89 y.o. [name] Primary Care Nevaeh Casillas: Deland Pretty Other Clinician: Referring Haasini Patnaude: Treating Frayda Egley/Extender: Raylene Everts in Treatment: 13 Wound Status Wound Number: 2 Primary Etiology: Venous Leg Ulcer Wound Location: Left, Medial Lower Leg Wound Status: Open Wounding Event: Skin Tear/Laceration Comorbid History: Hypertension, Dementia Date Acquired: 02/04/2022 Weeks Of Treatment: 13 Clustered Wound: Yes Photos Wound Measurements Length: (cm) 2.9 Width: (cm) 0.8 Depth: (cm) 0.1 Korzeniewski, Abree Mitchell ([id number]) Area: (cm) 1.822 Volume: (cm) 0.182 % Reduction in Area: 79.9% % Reduction in Volume: 79.9% Epithelialization: Small (1-33%) [phone].pdf Page 9 of 18 Tunneling: No Undermining: No Wound Description Classification: Full Thickness Without Exposed Support Exudate Amount: None Present Structures Foul Odor After Cleansing: No Slough/Fibrino Yes Wound Bed Granulation Amount: Large (67-100%) Granulation Quality: Red, Hyper-granulation, Friable Necrotic Amount: Small (1-33%) Necrotic  Quality: Eschar Periwound Skin Texture Texture Color No Abnormalities Noted: Yes No Abnormalities Noted: Yes Moisture Temperature / Pain No Abnormalities Noted: No Temperature: No Abnormality Dry / Scaly: No Maceration: No Treatment Notes Wound #2 (Lower Leg) Wound Laterality: Left, Medial Cleanser Soap and Water Discharge Instruction: May shower and wash wound with dial antibacterial soap and water prior to dressing change. Peri-Wound Care Sween Lotion (Moisturizing lotion) Discharge Instruction: Apply moisturizing lotion as directed Topical Primary Dressing Sorbalgon AG Dressing, 4x4 (in/in) Discharge Instruction: Apply to wound bed as instructed Secondary Dressing Secured With Compression Wrap Kerlix Roll 4.5x3.1 (in/yd) Discharge Instruction: Apply Kerlix and Coban compression as directed. Coban Self-Adherent Wrap 4x5 (in/yd) Discharge Instruction: Apply over Kerlix as directed. Compression Stockings Add-Ons Electronic Signature(s) Signed: 06/06/2022 5:21:05 PM By: Dellie Catholic RN Entered By: Dellie Catholic on 06/06/2022 11:30:44 -------------------------------------------------------------------------------- Wound Assessment Details Patient Name: Date of Service: [name], [name] 06/06/2022 11:00 A M Medical Record Number: [id number] Patient Account Number: [id number] Date of Birth/Sex: Treating RN: September 28, 1931 (89 y.o. [name] Primary Care Neeka Urista: Deland Pretty Other Clinician: Referring Amberlie Gaillard: Treating Bonna Steury/Extender: Raylene Everts in Treatment: [address], [place] Mitchell ([id number]) [id number].pdf Page 10 of 18 Wound Status Wound Number: 4 Primary Etiology: Cellulitis Wound Location: Right, Medial Lower Leg Wound Status: Open Wounding Event: Blister Comorbid History: Hypertension, Dementia Date Acquired: 04/18/2022 Weeks Of Treatment: 6 Clustered Wound: No Photos Wound Measurements Length:  (cm) 1.9 Width: (cm) 2.6 Depth: (cm) 0.1 Area: (cm) 3.88 Volume: (cm) 0.388 % Reduction in Area: 48% % Reduction in Volume: 48% Epithelialization: Small (1-33%) Tunneling: No Undermining: No Wound Description Classification: Full Thickness Without Exposed Support Structures Exudate Amount: Medium Exudate Type: Serosanguineous Exudate Color: red, brown Foul Odor After Cleansing: No Slough/Fibrino Yes Wound Bed Granulation Amount: Small (1-33%) Granulation Quality: Red, Pink Necrotic Amount: Large (67-100%) Necrotic Quality: Eschar, Adherent Slough Periwound Skin Texture Texture Color No Abnormalities Noted: Yes No Abnormalities Noted: No Rubor: Yes Moisture No Abnormalities Noted: No Temperature / Pain Dry / Scaly: No Temperature: No Abnormality Maceration: No Treatment Notes Wound #4 (Lower Leg) Wound Laterality: Right, Medial Cleanser Soap and Water Discharge Instruction: May shower and wash wound with dial antibacterial soap and water prior to dressing change. Peri-Wound Care Sween Lotion (Moisturizing lotion) Discharge Instruction: Apply moisturizing lotion as directed Topical Primary Dressing Sorbalgon AG Dressing, 4x4 (in/in) Discharge Instruction: Apply to wound bed as instructed Secondary Dressing Secured With Compression Wrap Kerlix Roll 4.5x3.1 (in/yd) Discharge Instruction: Apply Kerlix and Coban compression as directed. ARDITA, SURGEON ([id number]) [id number] Page 11 of 18 Coban Self-Adherent Wrap 4x5 (in/yd)  Discharge Instruction: Apply over Kerlix as directed. Compression Stockings Add-Ons Electronic Signature(s) Signed: 06/06/2022 5:21:05 PM By: Dellie Catholic RN Entered By: Dellie Catholic on 06/06/2022 11:31:07 -------------------------------------------------------------------------------- Wound Assessment Details Patient Name: Date of Service: Cynthia RDSCathlean Mitchell 06/06/2022 11:00 A M Medical Record Number:  [id number] Patient Account Number: [id number] Date of Birth/Sex: Treating RN: September 20, 1931 (89 y.o. [name] Primary Care Torsten Weniger: Deland Pretty Other Clinician: Referring Nyle Limb: Treating Sabriel Borromeo/Extender: Raylene Everts in Treatment: 13 Wound Status Wound Number: 5 Primary Etiology: Venous Leg Ulcer Wound Location: Left, Lateral Lower Leg Wound Status: Open Wounding Event: Blister Comorbid History: Hypertension, Dementia Date Acquired: 04/17/2022 Weeks Of Treatment: 6 Clustered Wound: No Photos Wound Measurements Length: (cm) 3.2 Width: (cm) 1.4 Depth: (cm) 0.1 Area: (cm) 3.519 Volume: (cm) 0.352 % Reduction in Area: 6.7% % Reduction in Volume: 6.6% Epithelialization: None Tunneling: No Undermining: No Wound Description Classification: Full Thickness Without Exposed Support Exudate Amount: None Present Structures Foul Odor After Cleansing: No Slough/Fibrino Yes Wound Bed Granulation Amount: Small (1-33%) Granulation Quality: Red, Hyper-granulation Necrotic Amount: Large (67-100%) Necrotic Quality: Eschar, Adherent Slough Periwound Skin Texture Texture Color No Abnormalities Noted: Yes No Abnormalities Noted: No Rubor: Yes Moisture No Abnormalities Noted: No Temperature / Pain [name], [name] ([id number]) [phone].pdf Page 12 of 18 Temperature: No Abnormality Treatment Notes Wound #5 (Lower Leg) Wound Laterality: Left, Lateral Cleanser Soap and Water Discharge Instruction: May shower and wash wound with dial antibacterial soap and water prior to dressing change. Peri-Wound Care Sween Lotion (Moisturizing lotion) Discharge Instruction: Apply moisturizing lotion as directed Topical Primary Dressing Sorbalgon AG Dressing, 4x4 (in/in) Discharge Instruction: Apply to wound bed as instructed Secondary Dressing Secured With Compression Wrap Kerlix Roll 4.5x3.1 (in/yd) Discharge Instruction: Apply  Kerlix and Coban compression as directed. Coban Self-Adherent Wrap 4x5 (in/yd) Discharge Instruction: Apply over Kerlix as directed. Compression Stockings Add-Ons Electronic Signature(s) Signed: 06/06/2022 5:21:05 PM By: Dellie Catholic RN Entered By: Dellie Catholic on 06/06/2022 11:31:27 -------------------------------------------------------------------------------- Wound Assessment Details Patient Name: Date of Service: [name], [name] 06/06/2022 11:00 A M Medical Record Number: [id number] Patient Account Number: [id number] Date of Birth/Sex: Treating RN: [birth year] (89 y.o. [name] Primary Care Birdell Frasier: Deland Pretty Other Clinician: Referring Adaira Centola: Treating Rashawnda Gaba/Extender: Raylene Everts in Treatment: 13 Wound Status Wound Number: 6 Primary Etiology: Venous Leg Ulcer Wound Location: Left, Posterior Lower Leg Wound Status: Open Wounding Event: Blister Comorbid History: Hypertension, Dementia Date Acquired: 04/17/2022 Weeks Of Treatment: 6 Clustered Wound: No Photos [name], [name] ([id number]) [phone].pdf Page 13 of 18 Wound Measurements Length: (cm) 0.3 Width: (cm) 0.7 Depth: (cm) 0.1 Area: (cm) 0.165 Volume: (cm) 0.016 % Reduction in Area: 73.7% % Reduction in Volume: 74.6% Epithelialization: Small (1-33%) Tunneling: No Undermining: No Wound Description Classification: Full Thickness Without Exposed Support Structures Exudate Amount: None Present Foul Odor After Cleansing: No Slough/Fibrino Yes Wound Bed Granulation Amount: Small (1-33%) Exposed Structure Granulation Quality: Red, Hyper-granulation Fascia Exposed: No Necrotic Amount: Large (67-100%) Fat Layer (Subcutaneous Tissue) Exposed: Yes Necrotic Quality: Eschar, Adherent Slough Tendon Exposed: No Muscle Exposed: No Joint Exposed: No Bone Exposed: No Periwound Skin Texture Texture Color No Abnormalities Noted: Yes No  Abnormalities Noted: No Rubor: Yes Moisture No Abnormalities Noted: No Dry / Scaly: No Maceration: No Treatment Notes Wound #6 (Lower Leg) Wound Laterality: Left, Posterior Cleanser Soap and Water Discharge Instruction: May shower and wash wound with dial antibacterial soap and water prior to dressing change. Peri-Wound Care Sween Lotion (  Moisturizing lotion) Discharge Instruction: Apply moisturizing lotion as directed Topical Primary Dressing Sorbalgon AG Dressing, 4x4 (in/in) Discharge Instruction: Apply to wound bed as instructed Secondary Dressing Secured With Compression Wrap Kerlix Roll 4.5x3.1 (in/yd) Discharge Instruction: Apply Kerlix and Coban compression as directed. Coban Self-Adherent Wrap 4x5 (in/yd) Discharge Instruction: Apply over Kerlix as directed. Compression Stockings Add-Ons [name], [name] ([id number]) [phone].pdf Page 14 of 18 Electronic Signature(s) Signed: 06/06/2022 5:21:05 PM By: Dellie Catholic RN Entered By: Dellie Catholic on 06/06/2022 11:31:58 -------------------------------------------------------------------------------- Wound Assessment Details Patient Name: Date of Service: Cynthia RDSCathlean Mitchell 06/06/2022 11:00 A M Medical Record Number: [id number] Patient Account Number: [id number] Date of Birth/Sex: Treating RN: [birth year] (89 y.o. [name] Primary Care Zidane Renner: Deland Pretty Other Clinician: Referring Abagayle Klutts: Treating Arlind Klingerman/Extender: Raylene Everts in Treatment: 13 Wound Status Wound Number: 7 Primary Etiology: Trauma, Other Wound Location: Left Wrist Wound Status: Open Wounding Event: Hematoma Comorbid History: Hypertension, Dementia Date Acquired: 05/12/2022 Weeks Of Treatment: 3 Clustered Wound: No Photos Wound Measurements Length: (cm) 0.1 Width: (cm) 0.1 Depth: (cm) 0.1 Area: (cm) 0.008 Volume: (cm) 0.001 % Reduction in Area: 99.5% % Reduction in  Volume: 99.4% Epithelialization: None Tunneling: No Undermining: No Wound Description Classification: Full Thickness Without Exposed Support Exudate Amount: Medium Exudate Type: Serosanguineous Exudate Color: red, brown Structures Foul Odor After Cleansing: No Slough/Fibrino Yes Wound Bed Granulation Amount: None Present (0%) Exposed Structure Necrotic Amount: Large (67-100%) Fat Layer (Subcutaneous Tissue) Exposed: Yes Necrotic Quality: Eschar Periwound Skin Texture Texture Color No Abnormalities Noted: Yes No Abnormalities Noted: Yes Moisture Temperature / Pain No Abnormalities Noted: Yes Temperature: No Abnormality Treatment Notes Wound #7 (Wrist) Wound Laterality: Left Cleanser Soap and Water [name], [name] ([id number]) [phone].pdf Page 15 of 18 Discharge Instruction: May shower and wash wound with dial antibacterial soap and water prior to dressing change. Peri-Wound Care Sween Lotion (Moisturizing lotion) Discharge Instruction: Apply moisturizing lotion as directed Topical Primary Dressing Sorbalgon AG Dressing, 4x4 (in/in) Discharge Instruction: Apply to wound bed as instructed Secondary Dressing Secured With Compression Wrap Kerlix Roll 4.5x3.1 (in/yd) Discharge Instruction: Apply Kerlix and Coban compression as directed. Coban Self-Adherent Wrap 4x5 (in/yd) Discharge Instruction: Apply over Kerlix as directed. Compression Stockings Add-Ons Electronic Signature(s) Signed: 06/06/2022 5:21:05 PM By: Dellie Catholic RN Entered By: Dellie Catholic on 06/06/2022 11:32:27 -------------------------------------------------------------------------------- Wound Assessment Details Patient Name: Date of Service: [name], [name] 06/06/2022 11:00 A M Medical Record Number: [id number] Patient Account Number: [id number] Date of Birth/Sex: Treating RN: October 05, 1931 (89 y.o. [name] Primary Care Karron Goens: Deland Pretty Other  Clinician: Referring Taneya Conkel: Treating Mahalie Kanner/Extender: Raylene Everts in Treatment: 13 Wound Status Wound Number: 8 Primary Etiology: Trauma, Other Wound Location: Right, Lateral Knee Wound Status: Open Wounding Event: Skin Tear/Laceration Comorbid History: Hypertension, Dementia Date Acquired: 05/12/2022 Weeks Of Treatment: 3 Clustered Wound: No Photos Wound Measurements Length: (cm) 6 Width: (cm) 3 Depth: (cm) 0.1 Area: (cm) 14.137 Volume: (cm) 1.414 [name], [name] ([id number]) % Reduction in Area: -3904.8% % Reduction in Volume: -3940% Epithelialization: Small (1-33%) Tunneling: No Undermining: No [id number].pdf Page 16 of 18 Wound Description Classification: Full Thickness Without Exposed Support Exudate Amount: Medium Exudate Type: Serosanguineous Exudate Color: red, brown Structures Foul Odor After Cleansing: No Slough/Fibrino Yes Wound Bed Granulation Amount: Small (1-33%) Exposed Structure Granulation Quality: Pink Fascia Exposed: No Necrotic Amount: Large (67-100%) Fat Layer (Subcutaneous Tissue) Exposed: Yes Necrotic Quality: Eschar, Adherent Slough Tendon Exposed: No Muscle Exposed: No Joint Exposed: No Bone  Exposed: No Periwound Skin Texture Texture Color No Abnormalities Noted: Yes No Abnormalities Noted: Yes Moisture Temperature / Pain No Abnormalities Noted: Yes Temperature: No Abnormality Treatment Notes Wound #8 (Knee) Wound Laterality: Right, Lateral Cleanser Soap and Water Discharge Instruction: May shower and wash wound with dial antibacterial soap and water prior to dressing change. Peri-Wound Care Sween Lotion (Moisturizing lotion) Discharge Instruction: Apply moisturizing lotion as directed Topical Primary Dressing Sorbalgon AG Dressing, 4x4 (in/in) Discharge Instruction: Apply to wound bed as instructed Secondary Dressing Secured With Compression Wrap Kerlix Roll 4.5x3.1  (in/yd) Discharge Instruction: Apply Kerlix and Coban compression as directed. Coban Self-Adherent Wrap 4x5 (in/yd) Discharge Instruction: Apply over Kerlix as directed. Compression Stockings Add-Ons Electronic Signature(s) Signed: 06/06/2022 5:21:05 PM By: Dellie Catholic RN Entered By: Dellie Catholic on 06/06/2022 11:32:55 -------------------------------------------------------------------------------- Wound Assessment Details Patient Name: Date of Service: [name], [name] 06/06/2022 11:00 A M Medical Record Number: [id number] Patient Account Number: [id number] Date of Birth/Sex: Treating RN: [birth year] (89 y.o. [name] Primary Care Arryanna Holquin: Deland Pretty Other Clinician: Referring Wessley Emert: Treating Paton Crum/Extender: Raylene Everts in Treatment: [address] [place] Mitchell ([id number]) [id number].pdf Page 17 of 18 Wound Status Wound Number: 9 Primary Etiology: Trauma, Other Wound Location: Left, Posterior Knee Wound Status: Open Wounding Event: Skin Tear/Laceration Comorbid History: Hypertension, Dementia Date Acquired: 05/11/2022 Weeks Of Treatment: 3 Clustered Wound: No Photos Wound Measurements Length: (cm) 4.8 Width: (cm) 3 Depth: (cm) 0.1 Area: (cm) 11.31 Volume: (cm) 1.131 % Reduction in Area: 52% % Reduction in Volume: 52% Epithelialization: Small (1-33%) Tunneling: No Undermining: No Wound Description Classification: Full Thickness Without Exposed Sup Exudate Amount: Medium Exudate Type: Serosanguineous Exudate Color: red, brown port Structures Wound Bed Granulation Amount: Small (1-33%) Exposed Structure Granulation Quality: Red, Hyper-granulation Fascia Exposed: No Necrotic Amount: Large (67-100%) Fat Layer (Subcutaneous Tissue) Exposed: No Necrotic Quality: Eschar, Adherent Slough Tendon Exposed: No Muscle Exposed: No Joint Exposed: No Bone Exposed: No Periwound Skin Texture Texture  Color No Abnormalities Noted: Yes No Abnormalities Noted: Yes Moisture Temperature / Pain No Abnormalities Noted: Yes Temperature: No Abnormality Treatment Notes Wound #9 (Knee) Wound Laterality: Left, Posterior Cleanser Soap and Water Discharge Instruction: May shower and wash wound with dial antibacterial soap and water prior to dressing change. Peri-Wound Care Sween Lotion (Moisturizing lotion) Discharge Instruction: Apply moisturizing lotion as directed Topical Primary Dressing Sorbalgon AG Dressing, 4x4 (in/in) Discharge Instruction: Apply to wound bed as instructed Secondary Dressing Secured With Compression Wrap Kerlix Roll 4.5x3.1 (in/yd) [name], [name] ([id number]) [phone].pdf Page 18 of 18 Discharge Instruction: Apply Kerlix and Coban compression as directed. Coban Self-Adherent Wrap 4x5 (in/yd) Discharge Instruction: Apply over Kerlix as directed. Compression Stockings Add-Ons Electronic Signature(s) Signed: 06/06/2022 5:21:05 PM By: Dellie Catholic RN Entered By: Dellie Catholic on 06/06/2022 11:29:52 -------------------------------------------------------------------------------- Vitals Details Patient Name: Date of Service: [name], [name]. 06/06/2022 11:00 A M Medical Record Number: [id number] Patient Account Number: [id number] Date of Birth/Sex: Treating RN: November 12, 1931 (89 y.o. [name] Primary Care Karmelo Bass: Deland Pretty Other Clinician: Referring Adesuwa Osgood: Treating Rease Wence/Extender: Raylene Everts in Treatment: 13 Vital Signs Time Taken: 11:15 Temperature (F): 97.4 Height (in): 66 Pulse (bpm): 94 Weight (lbs): 133 Respiratory Rate (breaths/min): 18 Body Mass Index (BMI): 21.5 Blood Pressure (mmHg): 152/81 Reference Range: 80 - 120 mg / dl Electronic Signature(s) Signed: 06/06/2022 5:21:05 PM By: Dellie Catholic RN Entered By: Dellie Catholic on 06/06/2022 11:43:00

## 2022-06-07 NOTE — Progress Notes (Signed)
[name], FRIENDS ([id number]) [id number] Page 1 of 15 Visit Report for 06/06/2022 Chief Complaint Document Details Patient Name: Date of Service: [name] 06/06/2022 11:00 A M Medical Record Number: [id number] Patient Account Number: [id number] Date of Birth/Sex: Treating RN: 10-29-1931 (89 y.o. F) Primary Care Provider: Deland Mitchell Other Clinician: Referring Provider: Treating Provider/Extender: [name] in Treatment: 13 Information Obtained from: Patient Chief Complaint Patient seen for complaints of Non-Healing Wounds. Electronic Signature(s) Signed: 06/06/2022 12:01:47 PM By: Cynthia Maudlin MD FACS Entered By: [name] on 06/06/2022 12:01:47 -------------------------------------------------------------------------------- Debridement Details Patient Name: Date of Service: Cynthia [name], [name]. 06/06/2022 11:00 A M Medical Record Number: [id number] Patient Account Number: [id number] Date of Birth/Sex: Treating RN: March 06, 1932 (89 y.o. [name] Primary Care Provider: Deland Mitchell Other Clinician: Referring Provider: Treating Provider/Extender: [name] in Treatment: 13 Debridement Performed for Assessment: Wound #8 Right,Lateral Knee Performed By: Physician Cynthia Maudlin, MD Debridement Type: Debridement Level of Consciousness (Pre-procedure): Awake and Alert Pre-procedure Verification/Time Out Yes - 11:41 Taken: Start Time: 11:41 Pain Control: Lidocaine 4% T opical Solution T Area Debrided (L x W): otal 6 (cm) x 3 (cm) = 18 (cm) Tissue and other material debrided: Non-Viable, Slough, Slough Level: Non-Viable Tissue Debridement Description: Selective/Open Wound Instrument: Curette Bleeding: Minimum Hemostasis Achieved: Pressure Response to Treatment: Procedure was tolerated well Level of Consciousness (Post- Awake and Alert procedure): Post Debridement  Measurements of Total Wound Length: (cm) 6 Width: (cm) 3 Depth: (cm) 0.1 Volume: (cm) 1.414 Character of Wound/Ulcer Post Debridement: Improved Post Procedure Diagnosis Same as Pre-procedure NAN, RUMBERGER C ([id number] Page 2 of 15 Notes Scribed for Dr. Celine Mitchell by Cynthia Sinner, RN Electronic Signature(s) Signed: 06/06/2022 12:13:08 PM By: Cynthia Maudlin MD FACS Signed: 06/06/2022 5:21:05 PM By: Cynthia Catholic RN Entered By: [name] on 06/06/2022 11:44:34 -------------------------------------------------------------------------------- Debridement Details Patient Name: Date of Service: Cynthia [name], [name]. 06/06/2022 11:00 A M Medical Record Number: [id number] Patient Account Number: [id number] Date of Birth/Sex: Treating RN: Mar 19, 1932 (89 y.o. [name] Primary Care Provider: Deland Mitchell Other Clinician: Referring Provider: Treating Provider/Extender: [name] in Treatment: 13 Debridement Performed for Assessment: Wound #4 Right,Medial Lower Leg Performed By: Physician Cynthia Maudlin, MD Debridement Type: Debridement Level of Consciousness (Pre-procedure): Awake and Alert Pre-procedure Verification/Time Out Yes - 11:41 Taken: Start Time: 11:41 Pain Control: Lidocaine 4% T opical Solution T Area Debrided (L x W): otal 1.9 (cm) x 2.6 (cm) = 4.94 (cm) Tissue and other material debrided: Non-Viable, Skin: Epidermis Level: Skin/Epidermis Debridement Description: Selective/Open Wound Instrument: Curette Bleeding: Minimum Hemostasis Achieved: Pressure Response to Treatment: Procedure was tolerated well Level of Consciousness (Post- Awake and Alert procedure): Post Debridement Measurements of Total Wound Length: (cm) 1.9 Width: (cm) 2.6 Depth: (cm) 0.1 Volume: (cm) 0.388 Character of Wound/Ulcer Post Debridement: Improved Post Procedure Diagnosis Same as Pre-procedure Notes Scribed  for Dr. Celine Mitchell by Cynthia Sinner, RN Electronic Signature(s) Signed: 06/06/2022 12:13:08 PM By: Cynthia Maudlin MD FACS Signed: 06/06/2022 5:21:05 PM By: Cynthia Catholic RN Entered By: [name] on 06/06/2022 11:45:22 -------------------------------------------------------------------------------- Debridement Details Patient Name: Date of Service: Cynthia [name], [name]. 06/06/2022 11:00 A M Medical Record Number: [id number] Patient Account Number: [id number] Date of Birth/Sex: Treating RN: Nov 22, 1931 (89 y.o. [name] Primary Care Provider: Deland Mitchell Other Clinician: [name], [name] ([id number]) [id number] Page 3 of 15 Referring Provider: Treating Provider/Extender: [name] in Treatment: 13 Debridement  Performed for Assessment: Wound #2 Left,Medial Lower Leg Performed By: Physician Cynthia Maudlin, MD Debridement Type: Debridement Severity of Tissue Pre Debridement: Fat layer exposed Level of Consciousness (Pre-procedure): Awake and Alert Pre-procedure Verification/Time Out Yes - 11:41 Taken: Start Time: 11:41 Pain Control: Lidocaine 4% T opical Solution T Area Debrided (L x W): otal 2.9 (cm) x 0.8 (cm) = 2.32 (cm) Tissue and other material debrided: Non-Viable, Slough, Slough Level: Non-Viable Tissue Debridement Description: Selective/Open Wound Instrument: Curette Bleeding: Minimum Hemostasis Achieved: Pressure Response to Treatment: Procedure was tolerated well Level of Consciousness (Post- Awake and Alert procedure): Post Debridement Measurements of Total Wound Length: (cm) 2.9 Width: (cm) 0.8 Depth: (cm) 0.1 Volume: (cm) 0.182 Character of Wound/Ulcer Post Debridement: Improved Severity of Tissue Post Debridement: Fat layer exposed Post Procedure Diagnosis Same as Pre-procedure Notes Scribed for Dr. Celine Mitchell by Cynthia Sinner, RN Electronic Signature(s) Signed: 06/06/2022 12:13:08 PM By: Cynthia Maudlin MD FACS Signed: 06/06/2022 5:21:05 PM By: Cynthia Catholic RN Entered By: [name] on 06/06/2022 11:46:06 -------------------------------------------------------------------------------- Debridement Details Patient Name: Date of Service: Cynthia [name], [name]. 06/06/2022 11:00 A M Medical Record Number: [id number] Patient Account Number: [id number] Date of Birth/Sex: Treating RN: April 20, 1931 (89 y.o. [name] Primary Care Provider: Deland Mitchell Other Clinician: Referring Provider: Treating Provider/Extender: [name] in Treatment: 13 Debridement Performed for Assessment: Wound #5 Left,Lateral Lower Leg Performed By: Physician Cynthia Maudlin, MD Debridement Type: Debridement Severity of Tissue Pre Debridement: Fat layer exposed Level of Consciousness (Pre-procedure): Awake and Alert Pre-procedure Verification/Time Out Yes - 11:41 Taken: Start Time: 11:41 Pain Control: Lidocaine 4% T opical Solution T Area Debrided (L x W): otal 3.2 (cm) x 1.4 (cm) = 4.48 (cm) Tissue and other material debrided: Non-Viable, Slough, Slough Level: Non-Viable Tissue Debridement Description: Selective/Open Wound Instrument: Curette Bleeding: Minimum Hemostasis Achieved: Pressure Response to Treatment: Procedure was tolerated well Level of Consciousness (Post- Awake and Alert procedure): [name], [name] ([id number]) [phone].pdf Page 4 of 15 Post Debridement Measurements of Total Wound Length: (cm) 3.2 Width: (cm) 1.4 Depth: (cm) 0.1 Volume: (cm) 0.352 Character of Wound/Ulcer Post Debridement: Improved Severity of Tissue Post Debridement: Fat layer exposed Post Procedure Diagnosis Same as Pre-procedure Notes Scribed for Dr. Celine Mitchell by Cynthia Sinner, RN Electronic Signature(s) Signed: 06/06/2022 12:13:08 PM By: Cynthia Maudlin MD FACS Signed: 06/06/2022 5:21:05 PM By: Cynthia Catholic RN Entered By: [name] on  06/06/2022 11:47:22 -------------------------------------------------------------------------------- HPI Details Patient Name: Date of Service: Cynthia [name], [name]. 06/06/2022 11:00 A M Medical Record Number: [id number] Patient Account Number: [id number] Date of Birth/Sex: Treating RN: 01/14/1932 (89 y.o. F) Primary Care Provider: Deland Mitchell Other Clinician: Referring Provider: Treating Provider/Extender: [name] in Treatment: 13 History of Present Illness HPI Description: ADMISSION 03/01/2022 This is an 89 year old woman with dementia, residing in a memory care facility. She has a past medical history significant for renal failure, status post kidney transplant and now off dialysis. She is on chronic immunosuppression with tacrolimus and prednisone. She was recently admitted with an extensive DVT of the left lower extremity, identified when her nursing facility staff noticed extreme swelling of her left lower leg. She was initiated on Eliquis. Vascular surgery saw her and recommended compression stockings. Unfortunately, her skin is so thin that the stockings, combined with the leg swelling, resulted in multiple wounds opening up on her left lower leg. ABI in clinic today was only 0.61. There are multiple lesions open on her leg, including a cluster of  3 just above the lateral ankle, 1 on her posterior calf, and another on the medial lower leg. They all have thick slough and eschar present. The skin of her leg is discolored and blue. 03/07/2022: All of the wounds are cleaner today. They have slough present but the eschar has softened. Edema control is good. 12/15; 2 of her 3 wounds are closed the only 1 remaining on the left lower leg is the 1 on the posterior calf and this looks healthy and measuring smaller. We have been using silver alginate with kerlix and Coban. Patient lives in the dementia unit at [facility] facility which is a life care  facility. 04/25/2022: Since the last visit in our clinic, the patient has suffered fairly significant decline in her cognitive function. She has been made chiefly comfort care measures and DNR. She has been moved from the memory care unit to the nursing area of her living facility. She also developed an acute DVT fairly , extensive from the report, in her right leg. Her TED hose were removed out of concern for potentially breaking the clot off and causing it to mobilize. Her Eliquis was resumed and as a result, she has developed multiple small hematomas on her leg, 1 of which has ruptured. She was referred back to the wound care center to have that site managed. The paperwork from her facility describes the area as necrotic, but what I see on exam is simply clotted blood. They have been applying Xeroform and Kerlix to her legs. She has 2+ pitting edema bilaterally. 05/16/2022: In the interval since our last visit, she has sustained multiple new wounds that appear secondary to falls. She has skin tears on her wrist, right lateral knee, left posterior knee, and left medial leg that are new. The large wound that I saw her for on January 9 has cleaned up quite nicely and is now rather superficial. Her edema is very poorly controlled. 05/23/2022: She has yet another new wound on her right lower leg just lateral to and below her knee this has been Steri-Stripped. Her right fourth toe is bright red, tender, and swollen, but there is no obvious wound. All of her other wounds look about the same. There is some slough accumulation on the surfaces. 06/06/2022: She has more new skin tears and some of the old ones have slough accumulation on their surface. Electronic Signature(s) Signed: 06/06/2022 12:03:04 PM By: Cynthia Maudlin MD FACS Entered By: [name] on 06/06/2022 12:03:04 Delle Reining ([id number].pdf Page 5 of  15 -------------------------------------------------------------------------------- Physical Exam Details Patient Name: Date of Service: [name] 06/06/2022 11:00 A M Medical Record Number: [id number] Patient Account Number: [id number] Date of Birth/Sex: Treating RN: [birth year] (89 y.o. F) Primary Care Provider: Deland Mitchell Other Clinician: Referring Provider: Treating Provider/Extender: [name] in Treatment: 13 Constitutional Hypertensive, asymptomatic. . . . Confused and agitated. Respiratory Normal work of breathing on room air. Notes 06/06/2022: She has more new skin tears and some of the old ones have slough accumulation on their surface. Electronic Signature(s) Signed: 06/06/2022 12:08:50 PM By: Cynthia Maudlin MD FACS Entered By: [name] on 06/06/2022 12:08:50 -------------------------------------------------------------------------------- Physician Orders Details Patient Name: Date of Service: Cynthia [name], [name]. 06/06/2022 11:00 A M Medical Record Number: [id number] Patient Account Number: [id number] Date of Birth/Sex: Treating RN: [birth year] (89 y.o. [name] Primary Care Provider: Deland Mitchell Other Clinician: Referring Provider: Treating Provider/Extender: [name] in Treatment:  48 Verbal / Phone Orders: No Diagnosis Coding ICD-10 Coding Code Description [id number] Non-pressure chronic ulcer of other part of right lower leg with fat layer exposed I82.492 Acute embolism and thrombosis of other specified deep vein of left lower extremity Dementia in other diseases classified elsewhere, unspecified severity, without behavioral disturbance, psychotic disturbance, mood F02.80 disturbance, and anxiety I82.491 Acute embolism and thrombosis of other specified deep vein of right lower extremity L97.822 Non-pressure chronic ulcer of other part of left lower leg with fat layer exposed L98.492  Non-pressure chronic ulcer of skin of other sites with fat layer exposed Follow-up Appointments Return appointment in 3 weeks. - Dr. Celine Mitchell Room 3 Anesthetic Wound #2 Left,Medial Lower Leg (In clinic) Topical Lidocaine 5% applied to wound bed Bathing/ Shower/ Hygiene May shower and wash wound with soap and water. Edema Control - Lymphedema / SCD / Other Avoid standing for long periods of time. Other Edema Control Orders/Instructions: - Elevate/Rest legs throughout the day [name], [name] ([id number]) [phone].pdf Page 6 of 15 Wound Treatment Wound #10 - T Third oe Wound Laterality: Left Cleanser: Soap and Water 1 x Per Day/30 Days Discharge Instructions: May shower and wash wound with dial antibacterial soap and water prior to dressing change. Peri-Wound Care: Sween Lotion (Moisturizing lotion) 1 x Per Day/30 Days Discharge Instructions: Apply moisturizing lotion as directed Prim Dressing: Sorbalgon AG Dressing, 4x4 (in/in) 1 x Per Day/30 Days ary Discharge Instructions: Apply to wound bed as instructed Compression Wrap: Kerlix Roll 4.5x3.1 (in/yd) 1 x Per Day/30 Days Discharge Instructions: Apply Kerlix and Coban compression as directed. Compression Wrap: Coban Self-Adherent Wrap 4x5 (in/yd) 1 x Per Day/30 Days Discharge Instructions: Apply over Kerlix as directed. Wound #2 - Lower Leg Wound Laterality: Left, Medial Cleanser: Soap and Water 1 x Per Day/30 Days Discharge Instructions: May shower and wash wound with dial antibacterial soap and water prior to dressing change. Peri-Wound Care: Sween Lotion (Moisturizing lotion) 1 x Per Day/30 Days Discharge Instructions: Apply moisturizing lotion as directed Prim Dressing: Sorbalgon AG Dressing, 4x4 (in/in) 1 x Per Day/30 Days ary Discharge Instructions: Apply to wound bed as instructed Compression Wrap: Kerlix Roll 4.5x3.1 (in/yd) 1 x Per Day/30 Days Discharge Instructions: Apply Kerlix and Coban  compression as directed. Compression Wrap: Coban Self-Adherent Wrap 4x5 (in/yd) 1 x Per Day/30 Days Discharge Instructions: Apply over Kerlix as directed. Wound #4 - Lower Leg Wound Laterality: Right, Medial Cleanser: Soap and Water 1 x Per Day/30 Days Discharge Instructions: May shower and wash wound with dial antibacterial soap and water prior to dressing change. Peri-Wound Care: Sween Lotion (Moisturizing lotion) 1 x Per Day/30 Days Discharge Instructions: Apply moisturizing lotion as directed Prim Dressing: Sorbalgon AG Dressing, 4x4 (in/in) 1 x Per Day/30 Days ary Discharge Instructions: Apply to wound bed as instructed Compression Wrap: Kerlix Roll 4.5x3.1 (in/yd) 1 x Per Day/30 Days Discharge Instructions: Apply Kerlix and Coban compression as directed. Compression Wrap: Coban Self-Adherent Wrap 4x5 (in/yd) 1 x Per Day/30 Days Discharge Instructions: Apply over Kerlix as directed. Wound #5 - Lower Leg Wound Laterality: Left, Lateral Cleanser: Soap and Water 1 x Per Day/30 Days Discharge Instructions: May shower and wash wound with dial antibacterial soap and water prior to dressing change. Peri-Wound Care: Sween Lotion (Moisturizing lotion) 1 x Per Day/30 Days Discharge Instructions: Apply moisturizing lotion as directed Prim Dressing: Sorbalgon AG Dressing, 4x4 (in/in) 1 x Per Day/30 Days ary Discharge Instructions: Apply to wound bed as instructed Compression Wrap: Kerlix Roll 4.5x3.1 (in/yd) 1 x Per  Day/30 Days Discharge Instructions: Apply Kerlix and Coban compression as directed. Compression Wrap: Coban Self-Adherent Wrap 4x5 (in/yd) 1 x Per Day/30 Days Discharge Instructions: Apply over Kerlix as directed. Wound #6 - Lower Leg Wound Laterality: Left, Posterior Cleanser: Soap and Water 1 x Per Day/30 Days Discharge Instructions: May shower and wash wound with dial antibacterial soap and water prior to dressing change. Peri-Wound Care: Sween Lotion (Moisturizing lotion) 1 x  Per Day/30 Days Discharge Instructions: Apply moisturizing lotion as directed Prim Dressing: Sorbalgon AG Dressing, 4x4 (in/in) 1 x Per Day/30 Days ary Discharge Instructions: Apply to wound bed as instructed [name], [name] ([id number]) [phone].pdf Page 7 of 15 Compression Wrap: Kerlix Roll 4.5x3.1 (in/yd) 1 x Per Day/30 Days Discharge Instructions: Apply Kerlix and Coban compression as directed. Compression Wrap: Coban Self-Adherent Wrap 4x5 (in/yd) 1 x Per Day/30 Days Discharge Instructions: Apply over Kerlix as directed. Wound #7 - Wrist Wound Laterality: Left Cleanser: Soap and Water 1 x Per Day/30 Days Discharge Instructions: May shower and wash wound with dial antibacterial soap and water prior to dressing change. Peri-Wound Care: Sween Lotion (Moisturizing lotion) 1 x Per Day/30 Days Discharge Instructions: Apply moisturizing lotion as directed Prim Dressing: Sorbalgon AG Dressing, 4x4 (in/in) 1 x Per Day/30 Days ary Discharge Instructions: Apply to wound bed as instructed Compression Wrap: Kerlix Roll 4.5x3.1 (in/yd) 1 x Per Day/30 Days Discharge Instructions: Apply Kerlix and Coban compression as directed. Compression Wrap: Coban Self-Adherent Wrap 4x5 (in/yd) 1 x Per Day/30 Days Discharge Instructions: Apply over Kerlix as directed. Wound #8 - Knee Wound Laterality: Right, Lateral Cleanser: Soap and Water 1 x Per Day/30 Days Discharge Instructions: May shower and wash wound with dial antibacterial soap and water prior to dressing change. Peri-Wound Care: Sween Lotion (Moisturizing lotion) 1 x Per Day/30 Days Discharge Instructions: Apply moisturizing lotion as directed Prim Dressing: Sorbalgon AG Dressing, 4x4 (in/in) 1 x Per Day/30 Days ary Discharge Instructions: Apply to wound bed as instructed Compression Wrap: Kerlix Roll 4.5x3.1 (in/yd) 1 x Per Day/30 Days Discharge Instructions: Apply Kerlix and Coban compression as directed. Compression  Wrap: Coban Self-Adherent Wrap 4x5 (in/yd) 1 x Per Day/30 Days Discharge Instructions: Apply over Kerlix as directed. Wound #9 - Knee Wound Laterality: Left, Posterior Cleanser: Soap and Water 1 x Per Day/30 Days Discharge Instructions: May shower and wash wound with dial antibacterial soap and water prior to dressing change. Peri-Wound Care: Sween Lotion (Moisturizing lotion) 1 x Per Day/30 Days Discharge Instructions: Apply moisturizing lotion as directed Prim Dressing: Sorbalgon AG Dressing, 4x4 (in/in) 1 x Per Day/30 Days ary Discharge Instructions: Apply to wound bed as instructed Compression Wrap: Kerlix Roll 4.5x3.1 (in/yd) 1 x Per Day/30 Days Discharge Instructions: Apply Kerlix and Coban compression as directed. Compression Wrap: Coban Self-Adherent Wrap 4x5 (in/yd) 1 x Per Day/30 Days Discharge Instructions: Apply over Kerlix as directed. Electronic Signature(s) Signed: 06/06/2022 12:13:08 PM By: Cynthia Maudlin MD FACS Entered By: [name] on 06/06/2022 12:09:13 -------------------------------------------------------------------------------- Problem List Details Patient Name: Date of Service: Cynthia [name], [name]. 06/06/2022 11:00 A M Medical Record Number: [id number] Patient Account Number: [id number] Date of Birth/Sex: Treating RN: Feb 11, 1932 (89 y.o. F) Primary Care Provider: Deland Mitchell Other Clinician: Referring Provider: Treating Provider/Extender: Elany, Coville, [name] ([id number]) [phone].pdf Page 8 of 15 Weeks in Treatment: 13 Active Problems ICD-10 Encounter Code Description Active Date MDM Diagnosis L97.812 Non-pressure chronic ulcer of other part of right lower leg with fat layer 04/25/2022 No Yes exposed I82.492 Acute  embolism and thrombosis of other specified deep vein of left lower 03/01/2022 No Yes extremity F02.80 Dementia in other diseases classified elsewhere, unspecified severity, without  03/01/2022 No Yes behavioral disturbance, psychotic disturbance, mood disturbance, and anxiety I82.491 Acute embolism and thrombosis of other specified deep vein of right lower 04/25/2022 No Yes extremity L97.822 Non-pressure chronic ulcer of other part of left lower leg with fat layer 05/16/2022 No Yes exposed L98.492 Non-pressure chronic ulcer of skin of other sites with fat layer exposed 05/16/2022 No Yes Inactive Problems ICD-10 Code Description Active Date Inactive Date L97.822 Non-pressure chronic ulcer of other part of left lower leg with fat layer exposed 03/01/2022 03/01/2022 Resolved Problems Electronic Signature(s) Signed: 06/06/2022 12:00:13 PM By: Cynthia Maudlin MD FACS Entered By: [name] on 06/06/2022 12:00:13 -------------------------------------------------------------------------------- Progress Note Details Patient Name: Date of Service: Cynthia [name], [name]. 06/06/2022 11:00 A M Medical Record Number: [id number] Patient Account Number: [id number] Date of Birth/Sex: Treating RN: 24-Aug-1931 (89 y.o. F) Primary Care Provider: Deland Mitchell Other Clinician: Referring Provider: Treating Provider/Extender: [name] in Treatment: 13 Subjective Chief Complaint Information obtained from Patient Patient seen for complaints of Non-Healing Wounds. [name], [name] ([id number]) [id number] Page 9 of 15 History of Present Illness (HPI) ADMISSION 03/01/2022 This is an 89 year old woman with dementia, residing in a memory care facility. She has a past medical history significant for renal failure, status post kidney transplant and now off dialysis. She is on chronic immunosuppression with tacrolimus and prednisone. She was recently admitted with an extensive DVT of the left lower extremity, identified when her nursing facility staff noticed extreme swelling of her left lower leg. She was initiated on Eliquis. Vascular  surgery saw her and recommended compression stockings. Unfortunately, her skin is so thin that the stockings, combined with the leg swelling, resulted in multiple wounds opening up on her left lower leg. ABI in clinic today was only 0.61. There are multiple lesions open on her leg, including a cluster of 3 just above the lateral ankle, 1 on her posterior calf, and another on the medial lower leg. They all have thick slough and eschar present. The skin of her leg is discolored and blue. 03/07/2022: All of the wounds are cleaner today. They have slough present but the eschar has softened. Edema control is good. 12/15; 2 of her 3 wounds are closed the only 1 remaining on the left lower leg is the 1 on the posterior calf and this looks healthy and measuring smaller. We have been using silver alginate with kerlix and Coban. Patient lives in the dementia unit at [facility] facility which is a life care facility. 04/25/2022: Since the last visit in our clinic, the patient has suffered fairly significant decline in her cognitive function. She has been made chiefly comfort care measures and DNR. She has been moved from the memory care unit to the nursing area of her living facility. She also developed an acute DVT fairly , extensive from the report, in her right leg. Her TED hose were removed out of concern for potentially breaking the clot off and causing it to mobilize. Her Eliquis was resumed and as a result, she has developed multiple small hematomas on her leg, 1 of which has ruptured. She was referred back to the wound care center to have that site managed. The paperwork from her facility describes the area as necrotic, but what I see on exam is simply clotted blood. They have been applying Xeroform and Kerlix  to her legs. She has 2+ pitting edema bilaterally. 05/16/2022: In the interval since our last visit, she has sustained multiple new wounds that appear secondary to falls. She has skin tears on her  wrist, right lateral knee, left posterior knee, and left medial leg that are new. The large wound that I saw her for on January 9 has cleaned up quite nicely and is now rather superficial. Her edema is very poorly controlled. 05/23/2022: She has yet another new wound on her right lower leg just lateral to and below her knee this has been Steri-Stripped. Her right fourth toe is bright red, tender, and swollen, but there is no obvious wound. All of her other wounds look about the same. There is some slough accumulation on the surfaces. 06/06/2022: She has more new skin tears and some of the old ones have slough accumulation on their surface. Patient History Information obtained from Patient. Family History Unknown History. Social History Never smoker, Marital Status - Married, Alcohol Use - Never, Drug Use - No History, Caffeine Use - Rarely. Medical History Eyes Denies history of Cataracts, Glaucoma, Optic Neuritis Ear/Nose/Mouth/Throat Denies history of Chronic sinus problems/congestion, Middle ear problems Hematologic/Lymphatic Denies history of Anemia, Hemophilia, Human Immunodeficiency Virus, Lymphedema, Sickle Cell Disease Respiratory Denies history of Aspiration, Asthma, Chronic Obstructive Pulmonary Disease (COPD), Pneumothorax, Sleep Apnea, Tuberculosis Cardiovascular Patient has history of Hypertension Gastrointestinal Denies history of Cirrhosis , Colitis, Crohnoos, Hepatitis A, Hepatitis B Endocrine Denies history of Type I Diabetes, Type II Diabetes Genitourinary Denies history of End Stage Renal Disease Integumentary (Skin) Denies history of History of Burn Musculoskeletal Denies history of Gout, Rheumatoid Arthritis, Osteoarthritis, Osteomyelitis Neurologic Patient has history of Dementia Denies history of Neuropathy, Paraplegia, Seizure Disorder Psychiatric Denies history of Anorexia/bulimia, Confinement Anxiety Hospitalization/Surgery History - Orif patella right-  2015. - 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. - Breast surgery- unknown. Medical A Surgical History Notes nd Genitourinary polyneuropathy Objective Constitutional Hypertensive, asymptomatic. Confused and agitated. [name], [name] ([id number]) [id number] Page 10 of 15 Vitals Time Taken: 11:15 AM, Height: 66 in, Weight: 133 lbs, BMI: 21.5, Temperature: 97.4 F, Pulse: 94 bpm, Respiratory Rate: 18 breaths/min, Blood Pressure: 152/81 mmHg. Respiratory Normal work of breathing on room air. General Notes: 06/06/2022: She has more new skin tears and some of the old ones have slough accumulation on their surface. Integumentary (Hair, Skin) Wound #10 status is Open. Original cause of wound was Blister. The date acquired was: 05/12/2022. The wound has been in treatment 3 weeks. The wound is located on the Left T Third. The wound measures 0.3cm length x 0.3cm width x 0.1cm depth; 0.071cm^2 area and 0.007cm^3 volume. There is Fat Layer oe (Subcutaneous Tissue) exposed. There is no tunneling or undermining noted. There is a medium amount of serosanguineous drainage noted. There is small (1- 33%) red granulation within the wound bed. There is a large (67-100%) amount of necrotic tissue within the wound bed including Eschar and Adherent Slough. The periwound skin appearance had no abnormalities noted for texture. The periwound skin appearance had no abnormalities noted for moisture. The periwound skin appearance had no abnormalities noted for color. Periwound temperature was noted as No Abnormality. Wound #2 status is Open. Original cause of wound was Skin T ear/Laceration. The date acquired was: 02/04/2022. The wound has been in treatment 13 weeks. The wound is located on the Left,Medial Lower Leg. The wound measures 2.9cm length x 0.8cm width x 0.1cm depth; 1.822cm^2 area and 0.182cm^3 volume. There is no tunneling or undermining  noted. There is a none present amount of drainage  noted. There is large (67-100%) red, friable, hyper - granulation within the wound bed. There is a small (1-33%) amount of necrotic tissue within the wound bed including Eschar. The periwound skin appearance had no abnormalities noted for texture. The periwound skin appearance had no abnormalities noted for color. The periwound skin appearance did not exhibit: Dry/Scaly, Maceration. Periwound temperature was noted as No Abnormality. Wound #4 status is Open. Original cause of wound was Blister. The date acquired was: 04/18/2022. The wound has been in treatment 6 weeks. The wound is located on the Right,Medial Lower Leg. The wound measures 1.9cm length x 2.6cm width x 0.1cm depth; 3.88cm^2 area and 0.388cm^3 volume. There is no tunneling or undermining noted. There is a medium amount of serosanguineous drainage noted. There is small (1-33%) red, pink granulation within the wound bed. There is a large (67-100%) amount of necrotic tissue within the wound bed including Eschar and Adherent Slough. The periwound skin appearance had no abnormalities noted for texture. The periwound skin appearance exhibited: Rubor. The periwound skin appearance did not exhibit: Dry/Scaly, Maceration. Periwound temperature was noted as No Abnormality. Wound #5 status is Open. Original cause of wound was Blister. The date acquired was: 04/17/2022. The wound has been in treatment 6 weeks. The wound is located on the Left,Lateral Lower Leg. The wound measures 3.2cm length x 1.4cm width x 0.1cm depth; 3.519cm^2 area and 0.352cm^3 volume. There is no tunneling or undermining noted. There is a none present amount of drainage noted. There is small (1-33%) red, hyper - granulation within the wound bed. There is a large (67-100%) amount of necrotic tissue within the wound bed including Eschar and Adherent Slough. The periwound skin appearance had no abnormalities noted for texture. The periwound skin appearance exhibited: Rubor. Periwound  temperature was noted as No Abnormality. Wound #6 status is Open. Original cause of wound was Blister. The date acquired was: 04/17/2022. The wound has been in treatment 6 weeks. The wound is located on the Left,Posterior Lower Leg. The wound measures 0.3cm length x 0.7cm width x 0.1cm depth; 0.165cm^2 area and 0.016cm^3 volume. There is Fat Layer (Subcutaneous Tissue) exposed. There is no tunneling or undermining noted. There is a none present amount of drainage noted. There is small (1-33%) red, hyper - granulation within the wound bed. There is a large (67-100%) amount of necrotic tissue within the wound bed including Eschar and Adherent Slough. The periwound skin appearance had no abnormalities noted for texture. The periwound skin appearance exhibited: Rubor. The periwound skin appearance did not exhibit: Dry/Scaly, Maceration. Wound #7 status is Open. Original cause of wound was Hematoma. The date acquired was: 05/12/2022. The wound has been in treatment 3 weeks. The wound is located on the Left Wrist. The wound measures 0.1cm length x 0.1cm width x 0.1cm depth; 0.008cm^2 area and 0.001cm^3 volume. There is Fat Layer (Subcutaneous Tissue) exposed. There is no tunneling or undermining noted. There is a medium amount of serosanguineous drainage noted. There is no granulation within the wound bed. There is a large (67-100%) amount of necrotic tissue within the wound bed including Eschar. The periwound skin appearance had no abnormalities noted for texture. The periwound skin appearance had no abnormalities noted for moisture. The periwound skin appearance had no abnormalities noted for color. Periwound temperature was noted as No Abnormality. Wound #8 status is Open. Original cause of wound was Skin T ear/Laceration. The date acquired was: 05/12/2022. The wound has been  in treatment 3 weeks. The wound is located on the Right,Lateral Knee. The wound measures 6cm length x 3cm width x 0.1cm depth;  14.137cm^2 area and 1.414cm^3 volume. There is Fat Layer (Subcutaneous Tissue) exposed. There is no tunneling or undermining noted. There is a medium amount of serosanguineous drainage noted. There is small (1-33%) pink granulation within the wound bed. There is a large (67-100%) amount of necrotic tissue within the wound bed including Eschar and Adherent Slough. The periwound skin appearance had no abnormalities noted for texture. The periwound skin appearance had no abnormalities noted for moisture. The periwound skin appearance had no abnormalities noted for color. Periwound temperature was noted as No Abnormality. Wound #9 status is Open. Original cause of wound was Skin T ear/Laceration. The date acquired was: 05/11/2022. The wound has been in treatment 3 weeks. The wound is located on the Left,Posterior Knee. The wound measures 4.8cm length x 3cm width x 0.1cm depth; 11.31cm^2 area and 1.131cm^3 volume. There is no tunneling or undermining noted. There is a medium amount of serosanguineous drainage noted. There is small (1-33%) red, hyper - granulation within the wound bed. There is a large (67-100%) amount of necrotic tissue within the wound bed including Eschar and Adherent Slough. The periwound skin appearance had no abnormalities noted for texture. The periwound skin appearance had no abnormalities noted for moisture. The periwound skin appearance had no abnormalities noted for color. Periwound temperature was noted as No Abnormality. Assessment Active Problems ICD-10 Non-pressure chronic ulcer of other part of right lower leg with fat layer exposed Acute embolism and thrombosis of other specified deep vein of left lower extremity Dementia in other diseases classified elsewhere, unspecified severity, without behavioral disturbance, psychotic disturbance, mood disturbance, and anxiety Acute embolism and thrombosis of other specified deep vein of right lower extremity Non-pressure chronic  ulcer of other part of left lower leg with fat layer exposed Non-pressure chronic ulcer of skin of other sites with fat layer exposed Procedures Wound #2 Pre-procedure diagnosis of Wound #2 is a Venous Leg Ulcer located on the Left,Medial Lower Leg .Severity of Tissue Pre Debridement is: Fat layer exposed. [name], [name] ([id number]) [id number] Page 11 of 15 There was a Selective/Open Wound Non-Viable Tissue Debridement with a total area of 2.32 sq cm performed by Cynthia Maudlin, MD. With the following instrument(s): Curette to remove Non-Viable tissue/material. Material removed includes [facility] after achieving pain control using Lidocaine 4% Topical Solution. No specimens were taken. A time out was conducted at 11:41, prior to the start of the procedure. A Minimum amount of bleeding was controlled with Pressure. The procedure was tolerated well. Post Debridement Measurements: 2.9cm length x 0.8cm width x 0.1cm depth; 0.182cm^3 volume. Character of Wound/Ulcer Post Debridement is improved. Severity of Tissue Post Debridement is: Fat layer exposed. Post procedure Diagnosis Wound #2: Same as Pre-Procedure General Notes: Scribed for Dr. Celine Mitchell by Cynthia Sinner, RN. Wound #4 Pre-procedure diagnosis of Wound #4 is a Cellulitis located on the Right,Medial Lower Leg . There was a Selective/Open Wound Skin/Epidermis Debridement with a total area of 4.94 sq cm performed by Cynthia Maudlin, MD. With the following instrument(s): Curette to remove Non-Viable tissue/material. Material removed includes Skin: Epidermis after achieving pain control using Lidocaine 4% T opical Solution. No specimens were taken. A time out was conducted at 11:41, prior to the start of the procedure. A Minimum amount of bleeding was controlled with Pressure. The procedure was tolerated well. Post Debridement Measurements: 1.9cm length x 2.6cm width x 0.1cm  depth; 0.388cm^3 volume. Character of  Wound/Ulcer Post Debridement is improved. Post procedure Diagnosis Wound #4: Same as Pre-Procedure General Notes: Scribed for Dr. Celine Mitchell by Cynthia Sinner, RN. Wound #5 Pre-procedure diagnosis of Wound #5 is a Venous Leg Ulcer located on the Left,Lateral Lower Leg .Severity of Tissue Pre Debridement is: Fat layer exposed. There was a Selective/Open Wound Non-Viable Tissue Debridement with a total area of 4.48 sq cm performed by Cynthia Maudlin, MD. With the following instrument(s): Curette to remove Non-Viable tissue/material. Material removed includes [facility] after achieving pain control using Lidocaine 4% Topical Solution. No specimens were taken. A time out was conducted at 11:41, prior to the start of the procedure. A Minimum amount of bleeding was controlled with Pressure. The procedure was tolerated well. Post Debridement Measurements: 3.2cm length x 1.4cm width x 0.1cm depth; 0.352cm^3 volume. Character of Wound/Ulcer Post Debridement is improved. Severity of Tissue Post Debridement is: Fat layer exposed. Post procedure Diagnosis Wound #5: Same as Pre-Procedure General Notes: Scribed for Dr. Celine Mitchell by Cynthia Sinner, RN. Wound #8 Pre-procedure diagnosis of Wound #8 is a Trauma, Other located on the Right,Lateral Knee . There was a Selective/Open Wound Non-Viable Tissue Debridement with a total area of 18 sq cm performed by Cynthia Maudlin, MD. With the following instrument(s): Curette to remove Non-Viable tissue/material. Material removed includes [facility] after achieving pain control using Lidocaine 4% Topical Solution. No specimens were taken. A time out was conducted at 11:41, prior to the start of the procedure. A Minimum amount of bleeding was controlled with Pressure. The procedure was tolerated well. Post Debridement Measurements: 6cm length x 3cm width x 0.1cm depth; 1.414cm^3 volume. Character of Wound/Ulcer Post Debridement is improved. Post procedure Diagnosis Wound #8: Same as  Pre-Procedure General Notes: Scribed for Dr. Celine Mitchell by Cynthia Sinner, RN. Plan Follow-up Appointments: Return appointment in 3 weeks. - Dr. Celine Mitchell Room 3 Anesthetic: Wound #2 Left,Medial Lower Leg: (In clinic) Topical Lidocaine 5% applied to wound bed Bathing/ Shower/ Hygiene: May shower and wash wound with soap and water. Edema Control - Lymphedema / SCD / Other: Avoid standing for long periods of time. Other Edema Control Orders/Instructions: - Elevate/Rest legs throughout the day WOUND #10: - T Third Wound Laterality: Left oe Cleanser: Soap and Water 1 x Per Day/30 Days Discharge Instructions: May shower and wash wound with dial antibacterial soap and water prior to dressing change. Peri-Wound Care: Sween Lotion (Moisturizing lotion) 1 x Per Day/30 Days Discharge Instructions: Apply moisturizing lotion as directed Prim Dressing: Sorbalgon AG Dressing, 4x4 (in/in) 1 x Per Day/30 Days ary Discharge Instructions: Apply to wound bed as instructed Com pression Wrap: Kerlix Roll 4.5x3.1 (in/yd) 1 x Per Day/30 Days Discharge Instructions: Apply Kerlix and Coban compression as directed. Com pression Wrap: Coban Self-Adherent Wrap 4x5 (in/yd) 1 x Per Day/30 Days Discharge Instructions: Apply over Kerlix as directed. WOUND #2: - Lower Leg Wound Laterality: Left, Medial Cleanser: Soap and Water 1 x Per Day/30 Days Discharge Instructions: May shower and wash wound with dial antibacterial soap and water prior to dressing change. Peri-Wound Care: Sween Lotion (Moisturizing lotion) 1 x Per Day/30 Days Discharge Instructions: Apply moisturizing lotion as directed Prim Dressing: Sorbalgon AG Dressing, 4x4 (in/in) 1 x Per Day/30 Days ary Discharge Instructions: Apply to wound bed as instructed Com pression Wrap: Kerlix Roll 4.5x3.1 (in/yd) 1 x Per Day/30 Days Discharge Instructions: Apply Kerlix and Coban compression as directed. Com pression Wrap: Coban Self-Adherent Wrap 4x5 (in/yd) 1 x Per Day/30  Days Discharge Instructions:  Apply over Kerlix as directed. WOUND #4: - Lower Leg Wound Laterality: Right, Medial Cleanser: Soap and Water 1 x Per Day/30 Days Discharge Instructions: May shower and wash wound with dial antibacterial soap and water prior to dressing change. Peri-Wound Care: Sween Lotion (Moisturizing lotion) 1 x Per Day/30 Days Discharge Instructions: Apply moisturizing lotion as directed Prim Dressing: Sorbalgon AG Dressing, 4x4 (in/in) 1 x Per Day/30 Days ary Discharge Instructions: Apply to wound bed as instructed Com pression Wrap: Kerlix Roll 4.5x3.1 (in/yd) 1 x Per Day/30 Days Discharge Instructions: Apply Kerlix and Coban compression as directed. Com pression Wrap: Coban Self-Adherent Wrap 4x5 (in/yd) 1 x Per Day/30 Days Discharge Instructions: Apply over Kerlix as directed. WOUND #5: - Lower Leg Wound Laterality: Left, Lateral Cleanser: Soap and Water 1 x Per Day/30 Days Discharge Instructions: May shower and wash wound with dial antibacterial soap and water prior to dressing change. Peri-Wound Care: Sween Lotion (Moisturizing lotion) 1 x Per Day/30 Days [name], [name] ([id number]) [phone].pdf Page 12 of 15 Discharge Instructions: Apply moisturizing lotion as directed Prim Dressing: Sorbalgon AG Dressing, 4x4 (in/in) 1 x Per Day/30 Days ary Discharge Instructions: Apply to wound bed as instructed Com pression Wrap: Kerlix Roll 4.5x3.1 (in/yd) 1 x Per Day/30 Days Discharge Instructions: Apply Kerlix and Coban compression as directed. Com pression Wrap: Coban Self-Adherent Wrap 4x5 (in/yd) 1 x Per Day/30 Days Discharge Instructions: Apply over Kerlix as directed. WOUND #6: - Lower Leg Wound Laterality: Left, Posterior Cleanser: Soap and Water 1 x Per Day/30 Days Discharge Instructions: May shower and wash wound with dial antibacterial soap and water prior to dressing change. Peri-Wound Care: Sween Lotion (Moisturizing lotion) 1 x Per  Day/30 Days Discharge Instructions: Apply moisturizing lotion as directed Prim Dressing: Sorbalgon AG Dressing, 4x4 (in/in) 1 x Per Day/30 Days ary Discharge Instructions: Apply to wound bed as instructed Com pression Wrap: Kerlix Roll 4.5x3.1 (in/yd) 1 x Per Day/30 Days Discharge Instructions: Apply Kerlix and Coban compression as directed. Com pression Wrap: Coban Self-Adherent Wrap 4x5 (in/yd) 1 x Per Day/30 Days Discharge Instructions: Apply over Kerlix as directed. WOUND #7: - Wrist Wound Laterality: Left Cleanser: Soap and Water 1 x Per Day/30 Days Discharge Instructions: May shower and wash wound with dial antibacterial soap and water prior to dressing change. Peri-Wound Care: Sween Lotion (Moisturizing lotion) 1 x Per Day/30 Days Discharge Instructions: Apply moisturizing lotion as directed Prim Dressing: Sorbalgon AG Dressing, 4x4 (in/in) 1 x Per Day/30 Days ary Discharge Instructions: Apply to wound bed as instructed Com pression Wrap: Kerlix Roll 4.5x3.1 (in/yd) 1 x Per Day/30 Days Discharge Instructions: Apply Kerlix and Coban compression as directed. Com pression Wrap: Coban Self-Adherent Wrap 4x5 (in/yd) 1 x Per Day/30 Days Discharge Instructions: Apply over Kerlix as directed. WOUND #8: - Knee Wound Laterality: Right, Lateral Cleanser: Soap and Water 1 x Per Day/30 Days Discharge Instructions: May shower and wash wound with dial antibacterial soap and water prior to dressing change. Peri-Wound Care: Sween Lotion (Moisturizing lotion) 1 x Per Day/30 Days Discharge Instructions: Apply moisturizing lotion as directed Prim Dressing: Sorbalgon AG Dressing, 4x4 (in/in) 1 x Per Day/30 Days ary Discharge Instructions: Apply to wound bed as instructed Com pression Wrap: Kerlix Roll 4.5x3.1 (in/yd) 1 x Per Day/30 Days Discharge Instructions: Apply Kerlix and Coban compression as directed. Com pression Wrap: Coban Self-Adherent Wrap 4x5 (in/yd) 1 x Per Day/30 Days Discharge  Instructions: Apply over Kerlix as directed. WOUND #9: - Knee Wound Laterality: Left, Posterior Cleanser: Soap and Water  1 x Per Day/30 Days Discharge Instructions: May shower and wash wound with dial antibacterial soap and water prior to dressing change. Peri-Wound Care: Sween Lotion (Moisturizing lotion) 1 x Per Day/30 Days Discharge Instructions: Apply moisturizing lotion as directed Prim Dressing: Sorbalgon AG Dressing, 4x4 (in/in) 1 x Per Day/30 Days ary Discharge Instructions: Apply to wound bed as instructed Com pression Wrap: Kerlix Roll 4.5x3.1 (in/yd) 1 x Per Day/30 Days Discharge Instructions: Apply Kerlix and Coban compression as directed. Com pression Wrap: Coban Self-Adherent Wrap 4x5 (in/yd) 1 x Per Day/30 Days Discharge Instructions: Apply over Kerlix as directed. 06/06/2022: She has more new skin tears and some of the old ones have slough accumulation on their surface. I used a curette to debride in slough off of the wounds where was present. We will continue silver alginate with Kerlix and Coban wrapping. I have scheduled her for follow-up in 2 weeks, but I am concerned that these visits are becoming too upsetting and agitating for her so we may need to reevaluate whether or not it is appropriate to continue to put her through clinic visits for as opposed to just receiving her wound care at her facility. Electronic Signature(s) Signed: 06/06/2022 12:10:19 PM By: Cynthia Maudlin MD FACS Entered By: [name] on 06/06/2022 12:10:18 -------------------------------------------------------------------------------- HxROS Details Patient Name: Date of Service: Cynthia [name], EV Waymond Cera. 06/06/2022 11:00 A M Medical Record Number: [id number] Patient Account Number: [id number] Date of Birth/Sex: Treating RN: 1931/10/19 (89 y.o. F) Primary Care Provider: Deland Mitchell Other Clinician: Referring Provider: Treating Provider/Extender: [name] in Treatment:  [place] From [name], [name] ([id number]) [id number] Page 13 of 15 Patient Eyes Medical History: Negative for: Cataracts; Glaucoma; Optic Neuritis Ear/Nose/Mouth/Throat Medical History: Negative for: Chronic sinus problems/congestion; Middle ear problems Hematologic/Lymphatic Medical History: Negative for: Anemia; Hemophilia; Human Immunodeficiency Virus; Lymphedema; Sickle Cell Disease Respiratory Medical History: Negative for: Aspiration; Asthma; Chronic Obstructive Pulmonary Disease (COPD); Pneumothorax; Sleep Apnea; Tuberculosis Cardiovascular Medical History: Positive for: Hypertension Gastrointestinal Medical History: Negative for: Cirrhosis ; Colitis; Crohns; Hepatitis A; Hepatitis B Endocrine Medical History: Negative for: Type I Diabetes; Type II Diabetes Genitourinary Medical History: Negative for: End Stage Renal Disease Past Medical History Notes: polyneuropathy Integumentary (Skin) Medical History: Negative for: History of Burn Musculoskeletal Medical History: Negative for: Gout; Rheumatoid Arthritis; Osteoarthritis; Osteomyelitis Neurologic Medical History: Positive for: Dementia Negative for: Neuropathy; Paraplegia; Seizure Disorder Psychiatric Medical History: Negative for: Anorexia/bulimia; Confinement Anxiety Immunizations Pneumococcal Vaccine: Received Pneumococcal Vaccination: Yes Received Pneumococcal Vaccination On or After 60th Birthday: Yes Implantable Devices None Hospitalization / Surgery History Type of Hospitalization/Surgery Orif 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 Breast surgery- unknown [name], [facility] ([id number]) [phone].pdf Page 14 of 15 Family and Social History Unknown History: Yes; Never smoker; Marital Status - Married; Alcohol Use: Never; Drug Use: No History; Caffeine Use: Rarely; Financial Concerns: No; Food, Clothing or Shelter  Needs: No; Support System Lacking: No; Transportation Concerns: No Electronic Signature(s) Signed: 06/06/2022 12:13:08 PM By: Cynthia Maudlin MD FACS Entered By: [name] on 06/06/2022 12:07:07 -------------------------------------------------------------------------------- SuperBill Details Patient Name: Date of Service: Cynthia [name], [name] 06/06/2022 Medical Record Number: [id number] Patient Account Number: [id number] Date of Birth/Sex: Treating RN: 07-Feb-1932 (89 y.o. F) Primary Care Provider: Deland Mitchell Other Clinician: Referring Provider: Treating Provider/Extender: [name] in Treatment: 13 Diagnosis Coding ICD-10 Codes Code Description [phone] Non-pressure chronic ulcer of other part of right lower leg with fat layer exposed I82.492 Acute embolism and  thrombosis of other specified deep vein of left lower extremity Dementia in other diseases classified elsewhere, unspecified severity, without behavioral disturbance, psychotic disturbance, mood F02.80 disturbance, and anxiety I82.491 Acute embolism and thrombosis of other specified deep vein of right lower extremity L97.822 Non-pressure chronic ulcer of other part of left lower leg with fat layer exposed L98.492 Non-pressure chronic ulcer of skin of other sites with fat layer exposed Facility Procedures : CPT4 Code: [id number] Description: [id number] - DEBRIDE WOUND 1ST 20 SQ CM OR < ICD-10 Diagnosis Description L97.812 Non-pressure chronic ulcer of other part of right lower leg with fat layer expos L97.822 Non-pressure chronic ulcer of other part of left lower leg with fat layer  expose L98.492 Non-pressure chronic ulcer of skin of other sites with fat layer exposed Modifier: ed d Quantity: 1 : CPT4 Code: [id number] Description: [id number] - DEBRIDE WOUND EA ADDL 20 SQ CM ICD-10 Diagnosis Description [id number] Non-pressure chronic ulcer of other part of right lower leg with fat layer expos L97.822  Non-pressure chronic ulcer of other part of left lower leg with fat layer  expose L98.492 Non-pressure chronic ulcer of skin of other sites with fat layer exposed Modifier: ed d Quantity: 1 Physician Procedures : CPT4: Description Modifier Code [id number] - WC PHYS LEVEL 3 - EST PT 25 ICD-10 Diagnosis Description [id number] Non-pressure chronic ulcer of other part of right lower leg with fat layer exposed L97.822 Non-pressure chronic ulcer of other part of left  lower leg with fat layer exposed L98.492 Non-pressure chronic ulcer of skin of other sites with fat layer exposed F02.80 Dementia in other diseases classified elsewhere, unspecified severity, without behavioral disturbance, p disturbance, mood  disturbance, and anxiety Quantity: 1 sychotic : Rack, [id number] - WC PHYS DEBR WO ANESTH 20 SQ CM ICD-10 Diagnosis Description L97.812 Non-pressure chronic ulcer of other part of right lower leg with fat layer exposed L97.822 Non-pressure chronic ulcer of other part of left lower leg with fat  layer exposed L98.492 Non-pressure chronic ulcer of skin of other sites with fat layer exposed Zelma C ([id number]) [id number] - WC PHYS DEBR WO ANESTH EA ADD 20 CM 1 ICD-10 Diagnosis Description L97.812  Non-pressure chronic ulcer of other part of right lower leg with fat layer exposed L97.822 Non-pressure chronic ulcer of other part of left lower leg with fat layer exposed L98.492 Non-pressure chronic ulcer of skin of other sites with fat layer exposed Quantity: 1 pdf Page 15 of 15 Electronic Signature(s) Signed: 06/06/2022 12:11:47 PM By: Cynthia Maudlin MD FACS Entered By: [name] on 06/06/2022 12:11:46

## 2022-06-13 DIAGNOSIS — S41111A Laceration without foreign body of right upper arm, initial encounter: Secondary | ICD-10-CM | POA: Diagnosis not present

## 2022-06-21 DIAGNOSIS — N189 Chronic kidney disease, unspecified: Secondary | ICD-10-CM | POA: Diagnosis not present

## 2022-06-21 DIAGNOSIS — I129 Hypertensive chronic kidney disease with stage 1 through stage 4 chronic kidney disease, or unspecified chronic kidney disease: Secondary | ICD-10-CM | POA: Diagnosis not present

## 2022-06-21 DIAGNOSIS — G8929 Other chronic pain: Secondary | ICD-10-CM | POA: Diagnosis not present

## 2022-06-21 DIAGNOSIS — F0280 Dementia in other diseases classified elsewhere without behavioral disturbance: Secondary | ICD-10-CM | POA: Diagnosis not present

## 2022-06-21 DIAGNOSIS — Z940 Kidney transplant status: Secondary | ICD-10-CM | POA: Diagnosis not present

## 2022-06-21 DIAGNOSIS — E039 Hypothyroidism, unspecified: Secondary | ICD-10-CM | POA: Diagnosis not present

## 2022-06-22 DIAGNOSIS — Z48817 Encounter for surgical aftercare following surgery on the skin and subcutaneous tissue: Secondary | ICD-10-CM | POA: Diagnosis not present

## 2022-06-27 ENCOUNTER — Encounter (HOSPITAL_BASED_OUTPATIENT_CLINIC_OR_DEPARTMENT_OTHER): Payer: Medicare PPO | Attending: General Surgery | Admitting: General Surgery

## 2022-06-27 DIAGNOSIS — F0280 Dementia in other diseases classified elsewhere without behavioral disturbance: Secondary | ICD-10-CM | POA: Insufficient documentation

## 2022-06-27 DIAGNOSIS — Z86718 Personal history of other venous thrombosis and embolism: Secondary | ICD-10-CM | POA: Diagnosis not present

## 2022-06-27 DIAGNOSIS — N1832 Chronic kidney disease, stage 3b: Secondary | ICD-10-CM | POA: Diagnosis not present

## 2022-06-27 DIAGNOSIS — L89892 Pressure ulcer of other site, stage 2: Secondary | ICD-10-CM | POA: Diagnosis not present

## 2022-06-27 DIAGNOSIS — L03115 Cellulitis of right lower limb: Secondary | ICD-10-CM | POA: Diagnosis not present

## 2022-06-27 DIAGNOSIS — F0390 Unspecified dementia without behavioral disturbance: Secondary | ICD-10-CM | POA: Diagnosis not present

## 2022-06-27 DIAGNOSIS — Z940 Kidney transplant status: Secondary | ICD-10-CM | POA: Diagnosis not present

## 2022-06-27 DIAGNOSIS — I879 Disorder of vein, unspecified: Secondary | ICD-10-CM | POA: Diagnosis not present

## 2022-06-27 DIAGNOSIS — I872 Venous insufficiency (chronic) (peripheral): Secondary | ICD-10-CM | POA: Diagnosis not present

## 2022-06-27 DIAGNOSIS — L97812 Non-pressure chronic ulcer of other part of right lower leg with fat layer exposed: Secondary | ICD-10-CM | POA: Diagnosis not present

## 2022-06-27 DIAGNOSIS — S0100XA Unspecified open wound of scalp, initial encounter: Secondary | ICD-10-CM | POA: Diagnosis not present

## 2022-06-27 DIAGNOSIS — L97222 Non-pressure chronic ulcer of left calf with fat layer exposed: Secondary | ICD-10-CM | POA: Diagnosis not present

## 2022-06-27 DIAGNOSIS — L97822 Non-pressure chronic ulcer of other part of left lower leg with fat layer exposed: Secondary | ICD-10-CM | POA: Diagnosis not present

## 2022-06-27 DIAGNOSIS — Z8589 Personal history of malignant neoplasm of other organs and systems: Secondary | ICD-10-CM | POA: Diagnosis not present

## 2022-06-28 DIAGNOSIS — F0390 Unspecified dementia without behavioral disturbance: Secondary | ICD-10-CM | POA: Diagnosis not present

## 2022-06-28 DIAGNOSIS — M7989 Other specified soft tissue disorders: Secondary | ICD-10-CM | POA: Diagnosis not present

## 2022-06-28 DIAGNOSIS — M19042 Primary osteoarthritis, left hand: Secondary | ICD-10-CM | POA: Diagnosis not present

## 2022-06-28 DIAGNOSIS — N189 Chronic kidney disease, unspecified: Secondary | ICD-10-CM | POA: Diagnosis not present

## 2022-06-28 DIAGNOSIS — I129 Hypertensive chronic kidney disease with stage 1 through stage 4 chronic kidney disease, or unspecified chronic kidney disease: Secondary | ICD-10-CM | POA: Diagnosis not present

## 2022-06-28 NOTE — Progress Notes (Signed)
NISHITA, VETH ([id number]) [id number] Page 1 of 20 Visit Report for 06/27/2022 Chief Complaint Document Details Patient Name: Date of Service: [name] [name] 06/27/2022 11:00 A M Medical Record Number: [id number] Patient Account Number: [id number] Date of Birth/Sex: Treating RN: 02/16/1932 (89 y.o. F) Primary Care Provider: Deland Pretty Other Clinician: Referring Provider: Treating Provider/Extender: Raylene Everts in Treatment: 16 Information Obtained from: Patient Chief Complaint Patient seen for complaints of Non-Healing Wounds. Electronic Signature(s) Signed: 06/27/2022 12:09:52 PM By: Fredirick Maudlin MD FACS Entered By: Fredirick Maudlin on 06/27/2022 12:09:52 -------------------------------------------------------------------------------- Debridement Details Patient Name: Date of Service: [name] [name], Cynthia Marseilles. 06/27/2022 11:00 A M Medical Record Number: [id number] Patient Account Number: [id number] Date of Birth/Sex: Treating RN: 1931/08/13 (89 y.o. [name] Primary Care Provider: Deland Pretty Other Clinician: Referring Provider: Treating Provider/Extender: Raylene Everts in Treatment: 16 Debridement Performed for Assessment: Wound #10 Left T Third oe Performed By: Physician Fredirick Maudlin, MD Debridement Type: Debridement Level of Consciousness (Pre-procedure): Awake and Alert Pre-procedure Verification/Time Out Yes - 11:50 Taken: Start Time: 11:50 Pain Control: Lidocaine 5% topical ointment T Area Debrided (L x W): otal 0.4 (cm) x 0.4 (cm) = 0.16 (cm) Tissue and other material debrided: Non-Viable, Eschar, Slough, Subcutaneous, Slough Level: Skin/Subcutaneous Tissue Debridement Description: Excisional Instrument: Curette Bleeding: Minimum Hemostasis Achieved: Pressure End Time: 12:03 Procedural Pain: 0 Post Procedural Pain: 0 Response to Treatment: Procedure was tolerated  well Level of Consciousness (Post- Awake and Alert procedure): Post Debridement Measurements of Total Wound Length: (cm) 0.4 Stage: Category/Stage II Width: (cm) 0.4 Depth: (cm) 0.1 Volume: (cm) 0.013 Character of Wound/Ulcer Post Debridement: Improved Post Procedure Diagnosis Same as Pre-procedure Notes Scribed for Dr. Celine Ahr by J.Scotton Electronic Signature(s) Signed: 06/27/2022 1:18:05 PM By: Fredirick Maudlin MD FACS Signed: 06/27/2022 4:50:01 PM By: Dellie Catholic RN [name], [name] ([id number]) [phone].pdf Page 2 of 20 Entered By: Dellie Catholic on 06/27/2022 12:16:52 -------------------------------------------------------------------------------- Debridement Details Patient Name: Date of Service: [name] [name] 06/27/2022 11:00 A M Medical Record Number: [id number] Patient Account Number: [id number] Date of Birth/Sex: Treating RN: [birth year] (89 y.o. [name] Primary Care Provider: Deland Pretty Other Clinician: Referring Provider: Treating Provider/Extender: Raylene Everts in Treatment: 16 Debridement Performed for Assessment: Wound #11 Right,Proximal,Anterior Lower Leg Performed By: Physician Fredirick Maudlin, MD Debridement Type: Debridement Level of Consciousness (Pre-procedure): Awake and Alert Pre-procedure Verification/Time Out Yes - 11:50 Taken: Start Time: 11:50 Pain Control: Lidocaine 5% topical ointment T Area Debrided (L x W): otal 6 (cm) x 4 (cm) = 24 (cm) Tissue and other material debrided: Non-Viable, Eschar, Slough, Subcutaneous, Slough Level: Skin/Subcutaneous Tissue Debridement Description: Excisional Instrument: Curette Bleeding: Minimum Hemostasis Achieved: Pressure End Time: 12:03 Procedural Pain: 0 Post Procedural Pain: 0 Response to Treatment: Procedure was tolerated well Level of Consciousness (Post- Awake and Alert procedure): Post Debridement Measurements of Total  Wound Length: (cm) 6 Width: (cm) 4 Depth: (cm) 0.2 Volume: (cm) 3.77 Character of Wound/Ulcer Post Debridement: Improved Post Procedure Diagnosis Same as Pre-procedure Notes Scribed for Dr. Celine Ahr by J.Scotton Electronic Signature(s) Signed: 06/27/2022 1:18:05 PM By: Fredirick Maudlin MD FACS Signed: 06/27/2022 4:50:01 PM By: Dellie Catholic RN Entered By: Dellie Catholic on 06/27/2022 12:18:02 -------------------------------------------------------------------------------- Debridement Details Patient Name: Date of Service: [name] [name], Cynthia Marse[place]. 06/27/2022 11:00 A M Medical Record Number: [id number] Patient Account Number: [id number] Date of Birth/Sex: Treating RN: 10-29-1931 (89 y.o. [name] Primary Care Provider: Deland Pretty Other Clinician:  Referring Provider: Treating Provider/Extender: Raylene Everts in Treatment: 16 Debridement Performed for Assessment: Wound #12 Right,Lateral Lower Leg Performed By: Physician Fredirick Maudlin, MD Debridement Type: Debridement Level of Consciousness (Pre-procedure): Awake and Alert Pre-procedure Verification/Time Out Yes - 11:50 Taken: Start Time: 11:50 Pain Control: Lidocaine 5% topical ointment T Area Debrided (L x W): otal 8.5 (cm) x 4 (cm) = 34 (cm) Tissue and other material debrided: Non-Viable, Eschar, [place], Subcutaneous, [place] Level: Skin/Subcutaneous Tissue [name], [name] ([id number]) [id number] Page 3 of 20 Debridement Description: Excisional Instrument: Curette Bleeding: Minimum Hemostasis Achieved: Pressure End Time: 12:03 Procedural Pain: 0 Post Procedural Pain: 0 Response to Treatment: Procedure was tolerated well Level of Consciousness (Post- Awake and Alert procedure): Post Debridement Measurements of Total Wound Length: (cm) 8.5 Width: (cm) 4 Depth: (cm) 0.1 Volume: (cm) 2.67 Character of Wound/Ulcer Post Debridement: Improved Post Procedure  Diagnosis Same as Pre-procedure Notes Scribed for Dr. Celine Ahr by J.Scotton Electronic Signature(s) Signed: 06/27/2022 1:18:05 PM By: Fredirick Maudlin MD FACS Signed: 06/27/2022 4:50:01 PM By: Dellie Catholic RN Entered By: Dellie Catholic on 06/27/2022 12:19:01 -------------------------------------------------------------------------------- Debridement Details Patient Name: Date of Service: [name] [name], Cynthia Marse[place]. 06/27/2022 11:00 A M Medical Record Number: [id number] Patient Account Number: [id number] Date of Birth/Sex: Treating RN: 1932/04/11 (89 y.o. [name] Primary Care Provider: Deland Pretty Other Clinician: Referring Provider: Treating Provider/Extender: Raylene Everts in Treatment: 16 Debridement Performed for Assessment: Wound #14 Left,Anterior Lower Leg Performed By: Physician Fredirick Maudlin, MD Debridement Type: Debridement Level of Consciousness (Pre-procedure): Awake and Alert Pre-procedure Verification/Time Out Yes - 11:50 Taken: Start Time: 11:50 Pain Control: Lidocaine 5% topical ointment T Area Debrided (L x W): otal 3.5 (cm) x 3.2 (cm) = 11.2 (cm) Tissue and other material debrided: Non-Viable, Eschar, Slough, Subcutaneous, Slough Level: Skin/Subcutaneous Tissue Debridement Description: Excisional Instrument: Curette Bleeding: Minimum Hemostasis Achieved: Pressure End Time: 12:03 Procedural Pain: 0 Post Procedural Pain: 0 Response to Treatment: Procedure was tolerated well Level of Consciousness (Post- Awake and Alert procedure): Post Debridement Measurements of Total Wound Length: (cm) 3.5 Width: (cm) 3.2 Depth: (cm) 0.1 Volume: (cm) 0.88 Character of Wound/Ulcer Post Debridement: Improved Post Procedure Diagnosis Same as Pre-procedure Notes Scribed for Dr. Celine Ahr by J.Scotton [name], [name] ([id number]) [phone].pdf Page 4 of 20 Electronic Signature(s) Signed: 06/27/2022 1:18:05 PM By:  Fredirick Maudlin MD FACS Signed: 06/27/2022 4:50:01 PM By: Dellie Catholic RN Entered By: Dellie Catholic on 06/27/2022 12:20:53 -------------------------------------------------------------------------------- Debridement Details Patient Name: Date of Service: [name] [name], Cynthia Marse[place]. 06/27/2022 11:00 A M Medical Record Number: [id number] Patient Account Number: [id number] Date of Birth/Sex: Treating RN: 01-25-1932 (89 y.o. [name] Primary Care Provider: Deland Pretty Other Clinician: Referring Provider: Treating Provider/Extender: Raylene Everts in Treatment: 16 Debridement Performed for Assessment: Wound #2 Left,Medial Lower Leg Performed By: Physician Fredirick Maudlin, MD Debridement Type: Debridement Severity of Tissue Pre Debridement: Fat layer exposed Level of Consciousness (Pre-procedure): Awake and Alert Pre-procedure Verification/Time Out Yes - 11:50 Taken: Start Time: 11:50 Pain Control: Lidocaine 5% topical ointment T Area Debrided (L x W): otal 0.1 (cm) x 0.1 (cm) = 0.01 (cm) Tissue and other material debrided: Non-Viable, Eschar, Slough, Subcutaneous, Slough Level: Skin/Subcutaneous Tissue Debridement Description: Excisional Instrument: Curette Bleeding: Minimum Hemostasis Achieved: Pressure End Time: 12:03 Procedural Pain: 0 Post Procedural Pain: 0 Response to Treatment: Procedure was tolerated well Level of Consciousness (Post- Awake and Alert procedure): Post Debridement Measurements of Total Wound Length: (cm) 0.1 Width: (cm) 0.1  Depth: (cm) 0.1 Volume: (cm) 0.001 Character of Wound/Ulcer Post Debridement: Improved Severity of Tissue Post Debridement: Fat layer exposed Post Procedure Diagnosis Same as Pre-procedure Notes Scribed for Dr. Celine Ahr by Martina Sinner, RN Electronic Signature(s) Signed: 06/27/2022 1:18:05 PM By: Fredirick Maudlin MD FACS Signed: 06/27/2022 4:50:01 PM By: Dellie Catholic RN Entered By: Dellie Catholic on  06/27/2022 12:21:44 -------------------------------------------------------------------------------- Debridement Details Patient Name: Date of Service: [name] [name], Cynthia Marse[place]. 06/27/2022 11:00 A M Medical Record Number: [id number] Patient Account Number: [id number] Date of Birth/Sex: Treating RN: [birth year] (89 y.o. [name] Primary Care Provider: Deland Pretty Other Clinician: Referring Provider: Treating Provider/Extender: Raylene Everts in Treatment: 16 Debridement Performed for Assessment: Wound #4 Right,Medial Lower Leg Performed By: Physician Fredirick Maudlin, MD Debridement Type: Debridement Level of Consciousness (Pre-procedure): Awake and Alert Pre-procedure Verification/Time Out [name], [name] ([id number]) [id number] Page 5 of 20 Pre-procedure Verification/Time Out Yes - 11:50 Taken: Start Time: 11:50 Pain Control: Lidocaine 5% topical ointment T Area Debrided (L x W): otal 2.3 (cm) x 2.2 (cm) = 5.06 (cm) Tissue and other material debrided: Non-Viable, Eschar, Slough, Subcutaneous, Slough Level: Skin/Subcutaneous Tissue Debridement Description: Excisional Instrument: Curette Bleeding: Minimum Hemostasis Achieved: Pressure End Time: 12:03 Procedural Pain: 0 Post Procedural Pain: 0 Response to Treatment: Procedure was tolerated well Level of Consciousness (Post- Awake and Alert procedure): Post Debridement Measurements of Total Wound Length: (cm) 2.3 Width: (cm) 2.2 Depth: (cm) 0.1 Volume: (cm) 0.397 Character of Wound/Ulcer Post Debridement: Improved Post Procedure Diagnosis Same as Pre-procedure Notes Scribed for Dr. Celine Ahr by Martina Sinner, RN Electronic Signature(s) Signed: 06/27/2022 1:18:05 PM By: Fredirick Maudlin MD FACS Signed: 06/27/2022 4:50:01 PM By: Dellie Catholic RN Entered By: Dellie Catholic on 06/27/2022  12:22:25 -------------------------------------------------------------------------------- Debridement Details Patient Name: Date of Service: [name] [name], Laurell Roof Mitchell. 06/27/2022 11:00 A M Medical Record Number: [id number] Patient Account Number: [id number] Date of Birth/Sex: Treating RN: May 20, 1931 (89 y.o. [name] Primary Care Provider: Deland Pretty Other Clinician: Referring Provider: Treating Provider/Extender: Raylene Everts in Treatment: 16 Debridement Performed for Assessment: Wound #5 Left,Lateral Lower Leg Performed By: Physician Fredirick Maudlin, MD Debridement Type: Debridement Severity of Tissue Pre Debridement: Fat layer exposed Level of Consciousness (Pre-procedure): Awake and Alert Pre-procedure Verification/Time Out Yes - 11:50 Taken: Start Time: 11:50 Pain Control: Lidocaine 5% topical ointment T Area Debrided (L x W): otal 2 (cm) x 1.5 (cm) = 3 (cm) Tissue and other material debrided: Non-Viable, Eschar, Slough, Subcutaneous, Slough Level: Skin/Subcutaneous Tissue Debridement Description: Excisional Instrument: Curette Bleeding: Minimum Hemostasis Achieved: Pressure End Time: 12:03 Procedural Pain: 0 Post Procedural Pain: 0 Response to Treatment: Procedure was tolerated well Level of Consciousness (Post- Awake and Alert procedure): Post Debridement Measurements of Total Wound Length: (cm) 2 Width: (cm) 1.5 Depth: (cm) 0.1 Volume: (cm) 0.236 Character of Wound/Ulcer Post Debridement: Improved [name], [name] ([id number]) [phone].pdf Page 6 of 20 Severity of Tissue Post Debridement: Fat layer exposed Post Procedure Diagnosis Same as Pre-procedure Notes Scribed for Dr. Celine Ahr by Martina Sinner, RN Electronic Signature(s) Signed: 06/27/2022 1:18:05 PM By: Fredirick Maudlin MD FACS Signed: 06/27/2022 4:50:01 PM By: Dellie Catholic RN Entered By: Dellie Catholic on 06/27/2022  12:23:12 -------------------------------------------------------------------------------- Debridement Details Patient Name: Date of Service: [name] [name], Cynthia Marse[place]. 06/27/2022 11:00 A M Medical Record Number: [id number] Patient Account Number: [id number] Date of Birth/Sex: Treating RN: 1931-07-31 (89 y.o. [name] Primary Care Provider: Deland Pretty Other Clinician: Referring Provider: Treating Provider/Extender: Vivi Barrack  Weeks in Treatment: 16 Debridement Performed for Assessment: Wound #6 Left,Posterior Lower Leg Performed By: Physician Fredirick Maudlin, MD Debridement Type: Debridement Severity of Tissue Pre Debridement: Fat layer exposed Level of Consciousness (Pre-procedure): Awake and Alert Pre-procedure Verification/Time Out Yes - 11:50 Taken: Start Time: 11:50 Pain Control: Lidocaine 5% topical ointment T Area Debrided (L x W): otal 7.8 (cm) x 3 (cm) = 23.4 (cm) Tissue and other material debrided: Non-Viable, Eschar, Slough, Subcutaneous, Slough Level: Skin/Subcutaneous Tissue Debridement Description: Excisional Instrument: Curette Bleeding: Minimum Hemostasis Achieved: Pressure End Time: 12:03 Procedural Pain: 0 Post Procedural Pain: 0 Response to Treatment: Procedure was tolerated well Level of Consciousness (Post- Awake and Alert procedure): Post Debridement Measurements of Total Wound Length: (cm) 7.8 Width: (cm) 3 Depth: (cm) 0.1 Volume: (cm) 1.838 Character of Wound/Ulcer Post Debridement: Improved Severity of Tissue Post Debridement: Fat layer exposed Post Procedure Diagnosis Same as Pre-procedure Notes Scribed for Dr. Celine Ahr by J.S. Electronic Signature(s) Signed: 06/27/2022 1:18:05 PM By: Fredirick Maudlin MD FACS Signed: 06/27/2022 4:50:01 PM By: Dellie Catholic RN Entered By: Dellie Catholic on 06/27/2022 12:24:40 -------------------------------------------------------------------------------- Debridement Details Patient  Name: Date of Service: [name] [name], Cynthia Marse[place]. 06/27/2022 11:00 A M Medical Record Number: [id number] Patient Account Number: [id number] Date of Birth/Sex: Treating RN: June 28, 1931 (89 y.o. [address], [place], [place] Mitchell ([id number]) [phone].pdf Page 7 of 20 Primary Care Provider: Deland Pretty Other Clinician: Referring Provider: Treating Provider/Extender: Raylene Everts in Treatment: 16 Debridement Performed for Assessment: Wound #8 Right,Lateral Knee Performed By: Physician Fredirick Maudlin, MD Debridement Type: Debridement Level of Consciousness (Pre-procedure): Awake and Alert Pre-procedure Verification/Time Out Yes - 11:50 Taken: Start Time: 11:50 Pain Control: Lidocaine 5% topical ointment T Area Debrided (L x W): otal 1.6 (cm) x 0.6 (cm) = 0.96 (cm) Tissue and other material debrided: Non-Viable, Eschar, Slough, Subcutaneous, Slough Level: Skin/Subcutaneous Tissue Debridement Description: Excisional Instrument: Curette Bleeding: Minimum Hemostasis Achieved: Pressure End Time: 12:03 Procedural Pain: 0 Post Procedural Pain: 0 Response to Treatment: Procedure was tolerated well Level of Consciousness (Post- Awake and Alert procedure): Post Debridement Measurements of Total Wound Length: (cm) 1.6 Width: (cm) 0.6 Depth: (cm) 0.1 Volume: (cm) 0.075 Character of Wound/Ulcer Post Debridement: Improved Post Procedure Diagnosis Same as Pre-procedure Notes Scribed for Dr. Celine Ahr by Martina Sinner, RN Electronic Signature(s) Signed: 06/27/2022 1:18:05 PM By: Fredirick Maudlin MD FACS Signed: 06/27/2022 4:50:01 PM By: Dellie Catholic RN Entered By: Dellie Catholic on 06/27/2022 12:25:21 -------------------------------------------------------------------------------- Debridement Details Patient Name: Date of Service: [name] [name], Laurell Roof Mitchell. 06/27/2022 11:00 A M Medical Record Number: [id number] Patient Account Number: [id number] Date  of Birth/Sex: Treating RN: 1931/11/11 (89 y.o. [name] Primary Care Provider: Deland Pretty Other Clinician: Referring Provider: Treating Provider/Extender: Raylene Everts in Treatment: 16 Debridement Performed for Assessment: Wound #13 Right,Distal,Anterior Lower Leg Performed By: Physician Fredirick Maudlin, MD Debridement Type: Debridement Level of Consciousness (Pre-procedure): Awake and Alert Pre-procedure Verification/Time Out Yes - 11:50 Taken: Start Time: 11:50 Pain Control: Lidocaine 5% topical ointment T Area Debrided (L x W): otal 9 (cm) x 5.5 (cm) = 49.5 (cm) Tissue and other material debrided: Non-Viable, Eschar, Slough, Subcutaneous, [place], Other: Hematoma Level: Skin/Subcutaneous Tissue Debridement Description: Excisional Instrument: Curette Bleeding: Minimum Hemostasis Achieved: Pressure End Time: 12:03 Procedural Pain: 0 Post Procedural Pain: 0 Response to Treatment: Procedure was tolerated well Level of Consciousness (Post- Awake and Alert procedure): [name], [name] ([id number]) [phone].pdf Page 8 of 20 Post Debridement Measurements of Total Wound Length: (cm) 9  Width: (cm) 5.5 Depth: (cm) 0.1 Volume: (cm) 3.888 Character of Wound/Ulcer Post Debridement: Improved Post Procedure Diagnosis Same as Pre-procedure Notes Scribed for Dr. Celine Ahr by J.Scotton Electronic Signature(s) Signed: 06/27/2022 1:18:05 PM By: Fredirick Maudlin MD FACS Signed: 06/27/2022 4:50:01 PM By: Dellie Catholic RN Entered By: Dellie Catholic on 06/27/2022 12:25:45 -------------------------------------------------------------------------------- HPI Details Patient Name: Date of Service: [name] [name], Laurell Roof Mitchell. 06/27/2022 11:00 A M Medical Record Number: [id number] Patient Account Number: [id number] Date of Birth/Sex: Treating RN: 1932-01-05 (89 y.o. F) Primary Care Provider: Deland Pretty Other Clinician: Referring  Provider: Treating Provider/Extender: Raylene Everts in Treatment: 16 History of Present Illness HPI Description: ADMISSION 03/01/2022 This is an 89 year old woman with dementia, residing in a memory care facility. She has a past medical history significant for renal failure, status post kidney transplant and now off dialysis. She is on chronic immunosuppression with tacrolimus and prednisone. She was recently admitted with an extensive DVT of the left lower extremity, identified when her nursing facility staff noticed extreme swelling of her left lower leg. She was initiated on Eliquis. Vascular surgery saw her and recommended compression stockings. Unfortunately, her skin is so thin that the stockings, combined with the leg swelling, resulted in multiple wounds opening up on her left lower leg. ABI in clinic today was only 0.61. There are multiple lesions open on her leg, including a cluster of 3 just above the lateral ankle, 1 on her posterior calf, and another on the medial lower leg. They all have thick slough and eschar present. The skin of her leg is discolored and blue. 03/07/2022: All of the wounds are cleaner today. They have slough present but the eschar has softened. Edema control is good. 12/15; 2 of her 3 wounds are closed the only 1 remaining on the left lower leg is the 1 on the posterior calf and this looks healthy and measuring smaller. We have been using silver alginate with kerlix and Coban. Patient lives in the dementia unit at [facility] facility which is a life care facility. 04/25/2022: Since the last visit in our clinic, the patient has suffered fairly significant decline in her cognitive function. She has been made chiefly comfort care measures and DNR. She has been moved from the memory care unit to the nursing area of her living facility. She also developed an acute DVT fairly , extensive from the report, in her right leg. Her TED hose were  removed out of concern for potentially breaking the clot off and causing it to mobilize. Her Eliquis was resumed and as a result, she has developed multiple small hematomas on her leg, 1 of which has ruptured. She was referred back to the wound care center to have that site managed. The paperwork from her facility describes the area as necrotic, but what I see on exam is simply clotted blood. They have been applying Xeroform and Kerlix to her legs. She has 2+ pitting edema bilaterally. 05/16/2022: In the interval since our last visit, she has sustained multiple new wounds that appear secondary to falls. She has skin tears on her wrist, right lateral knee, left posterior knee, and left medial leg that are new. The large wound that I saw her for on January 9 has cleaned up quite nicely and is now rather superficial. Her edema is very poorly controlled. 05/23/2022: She has yet another new wound on her right lower leg just lateral to and below her knee this has been Steri-Stripped. Her right fourth toe  is bright red, tender, and swollen, but there is no obvious wound. All of her other wounds look about the same. There is some slough accumulation on the surfaces. 06/06/2022: She has more new skin tears and some of the old ones have slough accumulation on their surface. 06/27/2022: The skin tear on her wrist has healed. She has a large hematoma on her right anterior leg, just lateral to the tibial surface. She has slough accumulation on all of her existing wounds. Electronic Signature(s) Signed: 06/27/2022 12:11:18 PM By: Fredirick Maudlin MD FACS Entered By: Fredirick Maudlin on 06/27/2022 12:11:17 -------------------------------------------------------------------------------- Physical Exam Details Patient Name: Date of Service: [name] [name], Cynthia ELYN Mitchell. 06/27/2022 11:00 A [name], [name] ([id number]) [phone].pdf Page 9 of 20 Medical Record Number: [id number] Patient Account Number:  [id number] Date of Birth/Sex: Treating RN: 04-Mar-1932 (89 y.o. F) Primary Care Provider: Deland Pretty Other Clinician: Referring Provider: Treating Provider/Extender: Raylene Everts in Treatment: 16 Constitutional . . . . no acute distress. Respiratory Normal work of breathing on room air. Notes 06/27/2022: The skin tear on her wrist has healed. She has a large hematoma on her right anterior leg, just lateral to the tibial surface. She has slough accumulation on all of her existing wounds. Electronic Signature(s) Signed: 06/27/2022 12:12:36 PM By: Fredirick Maudlin MD FACS Entered By: Fredirick Maudlin on 06/27/2022 12:12:36 -------------------------------------------------------------------------------- Physician Orders Details Patient Name: Date of Service: [name] [name], Cynthia ELYN Mitchell. 06/27/2022 11:00 A M Medical Record Number: [id number] Patient Account Number: [id number] Date of Birth/Sex: Treating RN: Sep 14, 1931 (89 y.o. [name] Primary Care Provider: Deland Pretty Other Clinician: Referring Provider: Treating Provider/Extender: Raylene Everts in Treatment: [phone] Verbal / Phone Orders: No Diagnosis Coding ICD-10 Coding Code Description [phone] Non-pressure chronic ulcer of other part of right lower leg with fat layer exposed L97.822 Non-pressure chronic ulcer of other part of left lower leg with fat layer exposed I82.492 Acute embolism and thrombosis of other specified deep vein of left lower extremity Dementia in other diseases classified elsewhere, unspecified severity, without behavioral disturbance, psychotic disturbance, mood F02.80 disturbance, and anxiety I82.491 Acute embolism and thrombosis of other specified deep vein of right lower extremity Follow-up Appointments Return appointment in 1 month. - Dr. Celine Ahr Room 3 Anesthetic Wound #2 Left,Medial Lower Leg (In clinic) Topical Lidocaine 5% applied to wound bed Bathing/  Shower/ Hygiene May shower and wash wound with soap and water. Edema Control - Lymphedema / SCD / Other Avoid standing for long periods of time. Other Edema Control Orders/Instructions: - Elevate/Rest legs throughout the day Wound Treatment Wound #10 - T Third oe Wound Laterality: Left Cleanser: Soap and Water 1 x Per Day/30 Days Discharge Instructions: May shower and wash wound with dial antibacterial soap and water prior to dressing change. Peri-Wound Care: Sween Lotion (Moisturizing lotion) 1 x Per Day/30 Days Discharge Instructions: Apply moisturizing lotion as directed Prim Dressing: Sorbalgon AG Dressing, 4x4 (in/in) 1 x Per Day/30 Days ary Discharge Instructions: Apply to wound bed as instructed Compression Wrap: Kerlix Roll 4.5x3.1 (in/yd) 1 x Per Day/30 Days Discharge Instructions: Apply Kerlix and Coban compression as directed. Compression Wrap: Coban Self-Adherent Wrap 4x5 (in/yd) 1 x Per Day/30 Days LARAH, SHOWERS ([id number]) [phone].pdf Page 10 of 20 Discharge Instructions: Apply over Kerlix as directed. Wound #11 - Lower Leg Wound Laterality: Right, Anterior, Proximal Cleanser: Soap and Water 1 x Per Day/30 Days Discharge Instructions: May shower and wash wound with dial antibacterial soap and water prior  to dressing change. Peri-Wound Care: Sween Lotion (Moisturizing lotion) 1 x Per Day/30 Days Discharge Instructions: Apply moisturizing lotion as directed Prim Dressing: Sorbalgon AG Dressing, 4x4 (in/in) 1 x Per Day/30 Days ary Discharge Instructions: Apply to wound bed as instructed Compression Wrap: Kerlix Roll 4.5x3.1 (in/yd) 1 x Per Day/30 Days Discharge Instructions: Apply Kerlix and Coban compression as directed. Compression Wrap: Coban Self-Adherent Wrap 4x5 (in/yd) 1 x Per Day/30 Days Discharge Instructions: Apply over Kerlix as directed. Wound #12 - Lower Leg Wound Laterality: Right, Lateral Cleanser: Soap and Water 1 x Per  Day/30 Days Discharge Instructions: May shower and wash wound with dial antibacterial soap and water prior to dressing change. Peri-Wound Care: Sween Lotion (Moisturizing lotion) 1 x Per Day/30 Days Discharge Instructions: Apply moisturizing lotion as directed Prim Dressing: Sorbalgon AG Dressing, 4x4 (in/in) 1 x Per Day/30 Days ary Discharge Instructions: Apply to wound bed as instructed Compression Wrap: Kerlix Roll 4.5x3.1 (in/yd) 1 x Per Day/30 Days Discharge Instructions: Apply Kerlix and Coban compression as directed. Compression Wrap: Coban Self-Adherent Wrap 4x5 (in/yd) 1 x Per Day/30 Days Discharge Instructions: Apply over Kerlix as directed. Wound #13 - Lower Leg Wound Laterality: Right, Anterior, Distal Cleanser: Soap and Water 1 x Per Day/30 Days Discharge Instructions: May shower and wash wound with dial antibacterial soap and water prior to dressing change. Peri-Wound Care: Sween Lotion (Moisturizing lotion) 1 x Per Day/30 Days Discharge Instructions: Apply moisturizing lotion as directed Prim Dressing: Sorbalgon AG Dressing, 4x4 (in/in) 1 x Per Day/30 Days ary Discharge Instructions: Apply to wound bed as instructed Compression Wrap: Kerlix Roll 4.5x3.1 (in/yd) 1 x Per Day/30 Days Discharge Instructions: Apply Kerlix and Coban compression as directed. Compression Wrap: Coban Self-Adherent Wrap 4x5 (in/yd) 1 x Per Day/30 Days Discharge Instructions: Apply over Kerlix as directed. Wound #14 - Lower Leg Wound Laterality: Left, Anterior Cleanser: Soap and Water 1 x Per Day/30 Days Discharge Instructions: May shower and wash wound with dial antibacterial soap and water prior to dressing change. Peri-Wound Care: Sween Lotion (Moisturizing lotion) 1 x Per Day/30 Days Discharge Instructions: Apply moisturizing lotion as directed Prim Dressing: Sorbalgon AG Dressing, 4x4 (in/in) 1 x Per Day/30 Days ary Discharge Instructions: Apply to wound bed as instructed Compression Wrap:  Kerlix Roll 4.5x3.1 (in/yd) 1 x Per Day/30 Days Discharge Instructions: Apply Kerlix and Coban compression as directed. Compression Wrap: Coban Self-Adherent Wrap 4x5 (in/yd) 1 x Per Day/30 Days Discharge Instructions: Apply over Kerlix as directed. Wound #2 - Lower Leg Wound Laterality: Left, Medial Cleanser: Soap and Water 1 x Per Day/30 Days Discharge Instructions: May shower and wash wound with dial antibacterial soap and water prior to dressing change. Peri-Wound Care: Sween Lotion (Moisturizing lotion) 1 x Per Day/30 Days Discharge Instructions: Apply moisturizing lotion as directed Prim Dressing: Sorbalgon AG Dressing, 4x4 (in/in) 1 x Per Day/30 Days ary Discharge Instructions: Apply to wound bed as instructed JENIYAH, CAUTHORN ([id number]) [phone].pdf Page 11 of 20 Compression Wrap: Kerlix Roll 4.5x3.1 (in/yd) 1 x Per Day/30 Days Discharge Instructions: Apply Kerlix and Coban compression as directed. Compression Wrap: Coban Self-Adherent Wrap 4x5 (in/yd) 1 x Per Day/30 Days Discharge Instructions: Apply over Kerlix as directed. Wound #4 - Lower Leg Wound Laterality: Right, Medial Cleanser: Soap and Water 1 x Per Day/30 Days Discharge Instructions: May shower and wash wound with dial antibacterial soap and water prior to dressing change. Peri-Wound Care: Sween Lotion (Moisturizing lotion) 1 x Per Day/30 Days Discharge Instructions: Apply moisturizing lotion as directed Prim Dressing:  Sorbalgon AG Dressing, 4x4 (in/in) 1 x Per Day/30 Days ary Discharge Instructions: Apply to wound bed as instructed Compression Wrap: Kerlix Roll 4.5x3.1 (in/yd) 1 x Per Day/30 Days Discharge Instructions: Apply Kerlix and Coban compression as directed. Compression Wrap: Coban Self-Adherent Wrap 4x5 (in/yd) 1 x Per Day/30 Days Discharge Instructions: Apply over Kerlix as directed. Wound #5 - Lower Leg Wound Laterality: Left, Lateral Cleanser: Soap and Water 1 x Per Day/30  Days Discharge Instructions: May shower and wash wound with dial antibacterial soap and water prior to dressing change. Peri-Wound Care: Sween Lotion (Moisturizing lotion) 1 x Per Day/30 Days Discharge Instructions: Apply moisturizing lotion as directed Prim Dressing: Sorbalgon AG Dressing, 4x4 (in/in) 1 x Per Day/30 Days ary Discharge Instructions: Apply to wound bed as instructed Compression Wrap: Kerlix Roll 4.5x3.1 (in/yd) 1 x Per Day/30 Days Discharge Instructions: Apply Kerlix and Coban compression as directed. Compression Wrap: Coban Self-Adherent Wrap 4x5 (in/yd) 1 x Per Day/30 Days Discharge Instructions: Apply over Kerlix as directed. Wound #6 - Lower Leg Wound Laterality: Left, Posterior Cleanser: Soap and Water 1 x Per Day/30 Days Discharge Instructions: May shower and wash wound with dial antibacterial soap and water prior to dressing change. Peri-Wound Care: Sween Lotion (Moisturizing lotion) 1 x Per Day/30 Days Discharge Instructions: Apply moisturizing lotion as directed Prim Dressing: Sorbalgon AG Dressing, 4x4 (in/in) 1 x Per Day/30 Days ary Discharge Instructions: Apply to wound bed as instructed Compression Wrap: Kerlix Roll 4.5x3.1 (in/yd) 1 x Per Day/30 Days Discharge Instructions: Apply Kerlix and Coban compression as directed. Compression Wrap: Coban Self-Adherent Wrap 4x5 (in/yd) 1 x Per Day/30 Days Discharge Instructions: Apply over Kerlix as directed. Wound #8 - Knee Wound Laterality: Right, Lateral Cleanser: Soap and Water 1 x Per Day/30 Days Discharge Instructions: May shower and wash wound with dial antibacterial soap and water prior to dressing change. Peri-Wound Care: Sween Lotion (Moisturizing lotion) 1 x Per Day/30 Days Discharge Instructions: Apply moisturizing lotion as directed Prim Dressing: Sorbalgon AG Dressing, 4x4 (in/in) 1 x Per Day/30 Days ary Discharge Instructions: Apply to wound bed as instructed Compression Wrap: Kerlix Roll 4.5x3.1  (in/yd) 1 x Per Day/30 Days Discharge Instructions: Apply Kerlix and Coban compression as directed. Compression Wrap: Coban Self-Adherent Wrap 4x5 (in/yd) 1 x Per Day/30 Days Discharge Instructions: Apply over Kerlix as directed. Electronic Signature(s) Signed: 06/27/2022 1:18:05 PM By: Fredirick Maudlin MD FACS [address] [place] ([id number]) By: Fredirick Maudlin MD FACS [phone].pdf Page 12 of 20 Signed: 06/27/2022 1:18:05 PM Entered By: Fredirick Maudlin on 06/27/2022 12:13:37 -------------------------------------------------------------------------------- Problem List Details Patient Name: Date of Service: [name] [name] 06/27/2022 11:00 A M Medical Record Number: [id number] Patient Account Number: [id number] Date of Birth/Sex: Treating RN: 11-21-1931 (89 y.o. F) Primary Care Provider: Deland Pretty Other Clinician: Referring Provider: Treating Provider/Extender: Raylene Everts in Treatment: 16 Active Problems ICD-10 Encounter Code Description Active Date MDM Diagnosis L97.812 Non-pressure chronic ulcer of other part of right lower leg with fat layer 04/25/2022 No Yes exposed L97.822 Non-pressure chronic ulcer of other part of left lower leg with fat layer 05/16/2022 No Yes exposed I82.492 Acute embolism and thrombosis of other specified deep vein of left lower 03/01/2022 No Yes extremity F02.80 Dementia in other diseases classified elsewhere, unspecified severity, without 03/01/2022 No Yes behavioral disturbance, psychotic disturbance, mood disturbance, and anxiety I82.491 Acute embolism and thrombosis of other specified deep vein of right lower 04/25/2022 No Yes extremity Inactive Problems ICD-10 Code Description Active Date Inactive Date L97.822 Non-pressure  chronic ulcer of other part of left lower leg with fat layer exposed 03/01/2022 03/01/2022 Resolved Problems ICD-10 Code Description Active Date Resolved Date L98.492  Non-pressure chronic ulcer of skin of other sites with fat layer exposed 05/16/2022 05/16/2022 Electronic Signature(s) Signed: 06/27/2022 12:09:30 PM By: Fredirick Maudlin MD FACS Entered By: Fredirick Maudlin on 06/27/2022 12:09:30 -------------------------------------------------------------------------------- Progress Note Details Patient Name: Date of Service: [name] [name], Cynthia [place]. 06/27/2022 11:00 A M Medical Record Number: [id number] Patient Account Number: [id number] Date of Birth/Sex: Treating RN: Nov 21, 1931 (89 y.o. F) Primary Care Provider: Deland Pretty Other Clinician: SYBIL, KUKULSKI ([id number]) [id number] Page 13 of 20 Referring Provider: Treating Provider/Extender: Raylene Everts in Treatment: 16 Subjective Chief Complaint Information obtained from Patient Patient seen for complaints of Non-Healing Wounds. History of Present Illness (HPI) ADMISSION 03/01/2022 This is an 89 year old woman with dementia, residing in a memory care facility. She has a past medical history significant for renal failure, status post kidney transplant and now off dialysis. She is on chronic immunosuppression with tacrolimus and prednisone. She was recently admitted with an extensive DVT of the left lower extremity, identified when her nursing facility staff noticed extreme swelling of her left lower leg. She was initiated on Eliquis. Vascular surgery saw her and recommended compression stockings. Unfortunately, her skin is so thin that the stockings, combined with the leg swelling, resulted in multiple wounds opening up on her left lower leg. ABI in clinic today was only 0.61. There are multiple lesions open on her leg, including a cluster of 3 just above the lateral ankle, 1 on her posterior calf, and another on the medial lower leg. They all have thick slough and eschar present. The skin of her leg is discolored and blue. 03/07/2022: All of the  wounds are cleaner today. They have slough present but the eschar has softened. Edema control is good. 12/15; 2 of her 3 wounds are closed the only 1 remaining on the left lower leg is the 1 on the posterior calf and this looks healthy and measuring smaller. We have been using silver alginate with kerlix and Coban. Patient lives in the dementia unit at [facility] facility which is a life care facility. 04/25/2022: Since the last visit in our clinic, the patient has suffered fairly significant decline in her cognitive function. She has been made chiefly comfort care measures and DNR. She has been moved from the memory care unit to the nursing area of her living facility. She also developed an acute DVT fairly , extensive from the report, in her right leg. Her TED hose were removed out of concern for potentially breaking the clot off and causing it to mobilize. Her Eliquis was resumed and as a result, she has developed multiple small hematomas on her leg, 1 of which has ruptured. She was referred back to the wound care center to have that site managed. The paperwork from her facility describes the area as necrotic, but what I see on exam is simply clotted blood. They have been applying Xeroform and Kerlix to her legs. She has 2+ pitting edema bilaterally. 05/16/2022: In the interval since our last visit, she has sustained multiple new wounds that appear secondary to falls. She has skin tears on her wrist, right lateral knee, left posterior knee, and left medial leg that are new. The large wound that I saw her for on January 9 has cleaned up quite nicely and is now rather superficial. Her edema is very poorly controlled. 05/23/2022:  She has yet another new wound on her right lower leg just lateral to and below her knee this has been Steri-Stripped. Her right fourth toe is bright red, tender, and swollen, but there is no obvious wound. All of her other wounds look about the same. There is some slough  accumulation on the surfaces. 06/06/2022: She has more new skin tears and some of the old ones have slough accumulation on their surface. 06/27/2022: The skin tear on her wrist has healed. She has a large hematoma on her right anterior leg, just lateral to the tibial surface. She has slough accumulation on all of her existing wounds. Patient History Information obtained from Patient. Family History Unknown History. Social History Never smoker, Marital Status - Married, Alcohol Use - Never, Drug Use - No History, Caffeine Use - Rarely. Medical History Eyes Denies history of Cataracts, Glaucoma, Optic Neuritis Ear/Nose/Mouth/Throat Denies history of Chronic sinus problems/congestion, Middle ear problems Hematologic/Lymphatic Denies history of Anemia, Hemophilia, Human Immunodeficiency Virus, Lymphedema, Sickle Cell Disease Respiratory Denies history of Aspiration, Asthma, Chronic Obstructive Pulmonary Disease (COPD), Pneumothorax, Sleep Apnea, Tuberculosis Cardiovascular Patient has history of Hypertension Gastrointestinal Denies history of Cirrhosis , Colitis, Crohnoos, Hepatitis A, Hepatitis B Endocrine Denies history of Type I Diabetes, Type II Diabetes Genitourinary Denies history of End Stage Renal Disease Integumentary (Skin) Denies history of History of Burn Musculoskeletal Denies history of Gout, Rheumatoid Arthritis, Osteoarthritis, Osteomyelitis Neurologic Patient has history of Dementia Denies history of Neuropathy, Paraplegia, Seizure Disorder Psychiatric Denies history of Anorexia/bulimia, Confinement Anxiety Hospitalization/Surgery History - Orif patella right- 2015. - 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. - Breast surgery- unknown. Medical A Surgical History Notes nd Genitourinary polyneuropathy TOMARA, LASH ([id number]) [id number] Page 14 of 20 Objective Constitutional no acute distress. Vitals Time Taken: 10:59 AM, Height: 66  in, Weight: 133 lbs, BMI: 21.5, Temperature: 98.3 F, Pulse: 93 bpm, Respiratory Rate: 16 breaths/min, Blood Pressure: 120/80 mmHg. Respiratory Normal work of breathing on room air. General Notes: 06/27/2022: The skin tear on her wrist has healed. She has a large hematoma on her right anterior leg, just lateral to the tibial surface. She has slough accumulation on all of her existing wounds. Integumentary (Hair, Skin) Wound #10 status is Open. Original cause of wound was Blister. The date acquired was: 05/12/2022. The wound has been in treatment 6 weeks. The wound is located on the Left T Third. The wound measures 0.4cm length x 0.4cm width x 0.1cm depth; 0.126cm^2 area and 0.013cm^3 volume. There is Fat Layer oe (Subcutaneous Tissue) exposed. There is no tunneling or undermining noted. There is a medium amount of serosanguineous drainage noted. There is small (1- 33%) red granulation within the wound bed. There is a large (67-100%) amount of necrotic tissue within the wound bed including Eschar and Adherent Slough. The periwound skin appearance had no abnormalities noted for texture. The periwound skin appearance had no abnormalities noted for moisture. The periwound skin appearance had no abnormalities noted for color. Periwound temperature was noted as No Abnormality. Wound #11 status is Open. Original cause of wound was Trauma. The date acquired was: 06/22/2022. The wound is located on the Right,Proximal,Anterior Lower Leg. The wound measures 6cm length x 4cm width x 0.2cm depth; 18.85cm^2 area and 3.77cm^3 volume. There is Fat Layer (Subcutaneous Tissue) exposed. There is no tunneling or undermining noted. There is a medium amount of serosanguineous drainage noted. There is medium (34-66%) red, hyper - granulation within the wound bed. There is a medium (34-66%) amount of necrotic  tissue within the wound bed including Eschar and Adherent Slough. The periwound skin appearance had no abnormalities  noted for texture. The periwound skin appearance had no abnormalities noted for moisture. The periwound skin appearance had no abnormalities noted for color. Periwound temperature was noted as No Abnormality. Wound #12 status is Open. Original cause of wound was Trauma. The date acquired was: 06/22/2022. The wound is located on the Right,Lateral Lower Leg. The wound measures 8.5cm length x 4cm width x 0.1cm depth; 26.704cm^2 area and 2.67cm^3 volume. There is Fat Layer (Subcutaneous Tissue) exposed. There is no tunneling or undermining noted. There is a medium amount of serosanguineous drainage noted. There is medium (34-66%) pink, pale granulation within the wound bed. There is a medium (34-66%) amount of necrotic tissue within the wound bed including Eschar and Adherent Slough. The periwound skin appearance had no abnormalities noted for texture. The periwound skin appearance had no abnormalities noted for moisture. The periwound skin appearance had no abnormalities noted for color. Periwound temperature was noted as No Abnormality. Wound #13 status is Open. Original cause of wound was Trauma. The date acquired was: 06/22/2022. The wound is located on the Right,Distal,Anterior Lower Leg. The wound measures 9cm length x 5.5cm width x 0.1cm depth; 38.877cm^2 area and 3.888cm^3 volume. There is Fat Layer (Subcutaneous Tissue) exposed. There is no tunneling or undermining noted. There is a medium amount of serosanguineous drainage noted. There is large (67-100%) red granulation within the wound bed. There is a small (1-33%) amount of necrotic tissue within the wound bed including Eschar and Adherent Slough. The periwound skin appearance had no abnormalities noted for texture. The periwound skin appearance had no abnormalities noted for moisture. The periwound skin appearance had no abnormalities noted for color. Periwound temperature was noted as No Abnormality. Wound #14 status is Open. Original cause of  wound was Trauma. The date acquired was: 06/22/2022. The wound is located on the Left,Anterior Lower Leg. The wound measures 3.5cm length x 3.2cm width x 0.1cm depth; 8.796cm^2 area and 0.88cm^3 volume. There is no tunneling or undermining noted. There is a medium amount of serosanguineous drainage noted. There is small (1-33%) pink, pale granulation within the wound bed. There is a large (67-100%) amount of necrotic tissue within the wound bed including Eschar and Adherent Slough. The periwound skin appearance had no abnormalities noted for texture. The periwound skin appearance had no abnormalities noted for moisture. The periwound skin appearance had no abnormalities noted for color. Periwound temperature was noted as No Abnormality. Wound #2 status is Open. Original cause of wound was Skin T ear/Laceration. The date acquired was: 02/04/2022. The wound has been in treatment 16 weeks. The wound is located on the Left,Medial Lower Leg. The wound measures 0.1cm length x 0.1cm width x 0.1cm depth; 0.008cm^2 area and 0.001cm^3 volume. There is a none present amount of drainage noted. There is small (1-33%) red, friable, hyper - granulation within the wound bed. There is a large (67-100%) amount of necrotic tissue within the wound bed including Eschar and Adherent Slough. The periwound skin appearance had no abnormalities noted for texture. The periwound skin appearance had no abnormalities noted for moisture. The periwound skin appearance had no abnormalities noted for color. Periwound temperature was noted as No Abnormality. Wound #4 status is Open. Original cause of wound was Blister. The date acquired was: 04/18/2022. The wound has been in treatment 9 weeks. The wound is located on the Right,Medial Lower Leg. The wound measures 2.3cm length x 2.2cm width  x 0.1cm depth; 3.974cm^2 area and 0.397cm^3 volume. There is no tunneling or undermining noted. There is a medium amount of serosanguineous drainage  noted. There is small (1-33%) red, pink granulation within the wound bed. There is a large (67-100%) amount of necrotic tissue within the wound bed including Eschar and Adherent Slough. The periwound skin appearance had no abnormalities noted for texture. The periwound skin appearance exhibited: Rubor. The periwound skin appearance did not exhibit: Dry/Scaly, Maceration. Periwound temperature was noted as No Abnormality. Wound #5 status is Open. Original cause of wound was Blister. The date acquired was: 04/17/2022. The wound has been in treatment 9 weeks. The wound is located on the Left,Lateral Lower Leg. The wound measures 2cm length x 1.5cm width x 0.1cm depth; 2.356cm^2 area and 0.236cm^3 volume. There is no tunneling or undermining noted. There is a none present amount of drainage noted. There is small (1-33%) red, hyper - granulation within the wound bed. There is a large (67-100%) amount of necrotic tissue within the wound bed including Eschar and Adherent Slough. The periwound skin appearance had no abnormalities noted for texture. The periwound skin appearance exhibited: Rubor. Periwound temperature was noted as No Abnormality. Wound #6 status is Open. Original cause of wound was Blister. The date acquired was: 04/17/2022. The wound has been in treatment 9 weeks. The wound is located on the Left,Posterior Lower Leg. The wound measures 7.8cm length x 3cm width x 0.1cm depth; 18.378cm^2 area and 1.838cm^3 volume. There is Fat Layer (Subcutaneous Tissue) exposed. There is no tunneling or undermining noted. There is a none present amount of drainage noted. There is small (1-33%) pink, pale granulation within the wound bed. There is a large (67-100%) amount of necrotic tissue within the wound bed including Eschar and Adherent Slough. The periwound skin appearance had no abnormalities noted for texture. The periwound skin appearance exhibited: Rubor. The periwound skin appearance did not exhibit:  Dry/Scaly, Maceration. Wound #7 status is Healed - Epithelialized. Original cause of wound was Hematoma. The date acquired was: 05/12/2022. The wound has been in treatment 6 weeks. The wound is located on the Left Wrist. The wound measures 0cm length x 0cm width x 0cm depth; 0cm^2 area and 0cm^3 volume. There is no tunneling or undermining noted. There is a none present amount of drainage noted. There is no granulation within the wound bed. There is no necrotic tissue within the wound bed. The periwound skin appearance had no abnormalities noted for texture. The periwound skin appearance had no abnormalities noted for SHAMERA, SPIEGELBERG ([id number]) [id number] Page 15 of 20 moisture. The periwound skin appearance had no abnormalities noted for color. Periwound temperature was noted as No Abnormality. Wound #8 status is Open. Original cause of wound was Skin T ear/Laceration. The date acquired was: 05/12/2022. The wound has been in treatment 6 weeks. The wound is located on the Right,Lateral Knee. The wound measures 1.6cm length x 0.6cm width x 0.1cm depth; 0.754cm^2 area and 0.075cm^3 volume. There is Fat Layer (Subcutaneous Tissue) exposed. There is no tunneling or undermining noted. There is a medium amount of serosanguineous drainage noted. There is small (1-33%) pink granulation within the wound bed. There is a large (67-100%) amount of necrotic tissue within the wound bed including Eschar and Adherent Slough. The periwound skin appearance had no abnormalities noted for texture. The periwound skin appearance had no abnormalities noted for moisture. The periwound skin appearance had no abnormalities noted for color. Periwound temperature was noted as No Abnormality. Wound #9  status is Healed - Epithelialized. Original cause of wound was Skin Tear/Laceration. The date acquired was: 05/11/2022. The wound has been in treatment 6 weeks. The wound is located on the Left,Posterior  Knee. The wound measures 0cm length x 0cm width x 0cm depth; 0cm^2 area and 0cm^3 volume. There is no tunneling or undermining noted. There is a none present amount of drainage noted. There is no granulation within the wound bed. There is no necrotic tissue within the wound bed. The periwound skin appearance had no abnormalities noted for texture. The periwound skin appearance had no abnormalities noted for moisture. The periwound skin appearance had no abnormalities noted for color. Periwound temperature was noted as No Abnormality. Assessment Active Problems ICD-10 Non-pressure chronic ulcer of other part of right lower leg with fat layer exposed Non-pressure chronic ulcer of other part of left lower leg with fat layer exposed Acute embolism and thrombosis of other specified deep vein of left lower extremity Dementia in other diseases classified elsewhere, unspecified severity, without behavioral disturbance, psychotic disturbance, mood disturbance, and anxiety Acute embolism and thrombosis of other specified deep vein of right lower extremity Procedures Wound #10 Pre-procedure diagnosis of Wound #10 is a Pressure Ulcer located on the Left T Third . There was a Excisional Skin/Subcutaneous Tissue Debridement with a oe total area of 0.16 sq cm performed by Fredirick Maudlin, MD. With the following instrument(s): Curette to remove Non-Viable tissue/material. Material removed includes Eschar, Subcutaneous Tissue, and Slough after achieving pain control using Lidocaine 5% topical ointment. No specimens were taken. A time out was conducted at 11:50, prior to the start of the procedure. A Minimum amount of bleeding was controlled with Pressure. The procedure was tolerated well with a pain level of 0 throughout and a pain level of 0 following the procedure. Post Debridement Measurements: 0.4cm length x 0.4cm width x 0.1cm depth; 0.013cm^3 volume. Post debridement Stage noted as Category/Stage  II. Character of Wound/Ulcer Post Debridement is improved. Post procedure Diagnosis Wound #10: Same as Pre-Procedure General Notes: Scribed for Dr. Celine Ahr by J.Scotton. Wound #11 Pre-procedure diagnosis of Wound #11 is a Skin T located on the Right,Proximal,Anterior Lower Leg . There was a Excisional Skin/Subcutaneous Tissue ear Debridement with a total area of 24 sq cm performed by Fredirick Maudlin, MD. With the following instrument(s): Curette to remove Non-Viable tissue/material. Material removed includes Eschar, Subcutaneous Tissue, and Slough after achieving pain control using Lidocaine 5% topical ointment. No specimens were taken. A time out was conducted at 11:50, prior to the start of the procedure. A Minimum amount of bleeding was controlled with Pressure. The procedure was tolerated well with a pain level of 0 throughout and a pain level of 0 following the procedure. Post Debridement Measurements: 6cm length x 4cm width x 0.2cm depth; 3.77cm^3 volume. Character of Wound/Ulcer Post Debridement is improved. Post procedure Diagnosis Wound #11: Same as Pre-Procedure General Notes: Scribed for Dr. Celine Ahr by J.Scotton. Wound #12 Pre-procedure diagnosis of Wound #12 is a Skin T located on the Right,Lateral Lower Leg . There was a Excisional Skin/Subcutaneous Tissue Debridement ear with a total area of 34 sq cm performed by Fredirick Maudlin, MD. With the following instrument(s): Curette to remove Non-Viable tissue/material. Material removed includes Eschar, Subcutaneous Tissue, and Slough after achieving pain control using Lidocaine 5% topical ointment. No specimens were taken. A time out was conducted at 11:50, prior to the start of the procedure. A Minimum amount of bleeding was controlled with Pressure. The procedure was tolerated well with  a pain level of 0 throughout and a pain level of 0 following the procedure. Post Debridement Measurements: 8.5cm length x 4cm width x 0.1cm  depth; 2.67cm^3 volume. Character of Wound/Ulcer Post Debridement is improved. Post procedure Diagnosis Wound #12: Same as Pre-Procedure General Notes: Scribed for Dr. Celine Ahr by J.Scotton. Wound #13 Pre-procedure diagnosis of Wound #13 is a Skin T located on the Right,Distal,Anterior Lower Leg . There was a Excisional Skin/Subcutaneous Tissue ear Debridement with a total area of 49.5 sq cm performed by Fredirick Maudlin, MD. With the following instrument(s): Curette to remove Non-Viable tissue/material. Material removed includes Eschar, Subcutaneous Tissue, Slough, and Other: Hematoma after achieving pain control using Lidocaine 5% topical ointment. No specimens were taken. A time out was conducted at 11:50, prior to the start of the procedure. A Minimum amount of bleeding was controlled with Pressure. The procedure was tolerated well with a pain level of 0 throughout and a pain level of 0 following the procedure. Post Debridement Measurements: 9cm length x 5.5cm width x 0.1cm depth; 3.888cm^3 volume. Character of Wound/Ulcer Post Debridement is improved. Post procedure Diagnosis Wound #13: Same as Pre-Procedure General Notes: Scribed for Dr. Celine Ahr by J.Scotton. Wound #14 Pre-procedure diagnosis of Wound #14 is a Skin T located on the Left,Anterior Lower Leg . There was a Excisional Skin/Subcutaneous Tissue Debridement ear with a total area of 11.2 sq cm performed by Fredirick Maudlin, MD. With the following instrument(s): Curette to remove Non-Viable tissue/material. Material removed includes Eschar, Subcutaneous Tissue, and Slough after achieving pain control using Lidocaine 5% topical ointment. No specimens were taken. A time out was conducted at 11:50, prior to the start of the procedure. A Minimum amount of bleeding was controlled with Pressure. The procedure was tolerated well with a pain level of 0 throughout and a pain level of 0 following the procedure. Post Debridement Measurements:  3.5cm length x 3.2cm width x 0.1cm depth; 0.88cm^3 volume. Character of Wound/Ulcer Post Debridement is improved. Post procedure Diagnosis Wound #14: Same as Pre-Procedure General Notes: Scribed for Dr. Celine Ahr by J.Scotton. SHIRLEEN, SNARSKI ([id number]) [id number] Page 16 of 20 Wound #2 Pre-procedure diagnosis of Wound #2 is a Venous Leg Ulcer located on the Left,Medial Lower Leg .Severity of Tissue Pre Debridement is: Fat layer exposed. There was a Excisional Skin/Subcutaneous Tissue Debridement with a total area of 0.01 sq cm performed by Fredirick Maudlin, MD. With the following instrument(s): Curette to remove Non-Viable tissue/material. Material removed includes Eschar, Subcutaneous Tissue, and Slough after achieving pain control using Lidocaine 5% topical ointment. No specimens were taken. A time out was conducted at 11:50, prior to the start of the procedure. A Minimum amount of bleeding was controlled with Pressure. The procedure was tolerated well with a pain level of 0 throughout and a pain level of 0 following the procedure. Post Debridement Measurements: 0.1cm length x 0.1cm width x 0.1cm depth; 0.001cm^3 volume. Character of Wound/Ulcer Post Debridement is improved. Severity of Tissue Post Debridement is: Fat layer exposed. Post procedure Diagnosis Wound #2: Same as Pre-Procedure General Notes: Scribed for Dr. Celine Ahr by Martina Sinner, RN. Wound #4 Pre-procedure diagnosis of Wound #4 is a Cellulitis located on the Right,Medial Lower Leg . There was a Excisional Skin/Subcutaneous Tissue Debridement with a total area of 5.06 sq cm performed by Fredirick Maudlin, MD. With the following instrument(s): Curette to remove Non-Viable tissue/material. Material removed includes Eschar, Subcutaneous Tissue, and Slough after achieving pain control using Lidocaine 5% topical ointment. No specimens were taken. A time out  was conducted at 11:50, prior to the start of the  procedure. A Minimum amount of bleeding was controlled with Pressure. The procedure was tolerated well with a pain level of 0 throughout and a pain level of 0 following the procedure. Post Debridement Measurements: 2.3cm length x 2.2cm width x 0.1cm depth; 0.397cm^3 volume. Character of Wound/Ulcer Post Debridement is improved. Post procedure Diagnosis Wound #4: Same as Pre-Procedure General Notes: Scribed for Dr. Celine Ahr by Martina Sinner, RN. Wound #5 Pre-procedure diagnosis of Wound #5 is a Venous Leg Ulcer located on the Left,Lateral Lower Leg .Severity of Tissue Pre Debridement is: Fat layer exposed. There was a Excisional Skin/Subcutaneous Tissue Debridement with a total area of 3 sq cm performed by Fredirick Maudlin, MD. With the following instrument(s): Curette to remove Non-Viable tissue/material. Material removed includes Eschar, Subcutaneous Tissue, and Slough after achieving pain control using Lidocaine 5% topical ointment. No specimens were taken. A time out was conducted at 11:50, prior to the start of the procedure. A Minimum amount of bleeding was controlled with Pressure. The procedure was tolerated well with a pain level of 0 throughout and a pain level of 0 following the procedure. Post Debridement Measurements: 2cm length x 1.5cm width x 0.1cm depth; 0.236cm^3 volume. Character of Wound/Ulcer Post Debridement is improved. Severity of Tissue Post Debridement is: Fat layer exposed. Post procedure Diagnosis Wound #5: Same as Pre-Procedure General Notes: Scribed for Dr. Celine Ahr by Martina Sinner, RN. Wound #6 Pre-procedure diagnosis of Wound #6 is a Venous Leg Ulcer located on the Left,Posterior Lower Leg .Severity of Tissue Pre Debridement is: Fat layer exposed. There was a Excisional Skin/Subcutaneous Tissue Debridement with a total area of 23.4 sq cm performed by Fredirick Maudlin, MD. With the following instrument(s): Curette to remove Non-Viable tissue/material. Material removed includes  Eschar, Subcutaneous Tissue, and Slough after achieving pain control using Lidocaine 5% topical ointment. No specimens were taken. A time out was conducted at 11:50, prior to the start of the procedure. A Minimum amount of bleeding was controlled with Pressure. The procedure was tolerated well with a pain level of 0 throughout and a pain level of 0 following the procedure. Post Debridement Measurements: 7.8cm length x 3cm width x 0.1cm depth; 1.838cm^3 volume. Character of Wound/Ulcer Post Debridement is improved. Severity of Tissue Post Debridement is: Fat layer exposed. Post procedure Diagnosis Wound #6: Same as Pre-Procedure General Notes: Scribed for Dr. Celine Ahr by Lenna Sciara.S.. Wound #8 Pre-procedure diagnosis of Wound #8 is a Trauma, Other located on the Right,Lateral Knee . There was a Excisional Skin/Subcutaneous Tissue Debridement with a total area of 0.96 sq cm performed by Fredirick Maudlin, MD. With the following instrument(s): Curette to remove Non-Viable tissue/material. Material removed includes Eschar, Subcutaneous Tissue, and Slough after achieving pain control using Lidocaine 5% topical ointment. No specimens were taken. A time out was conducted at 11:50, prior to the start of the procedure. A Minimum amount of bleeding was controlled with Pressure. The procedure was tolerated well with a pain level of 0 throughout and a pain level of 0 following the procedure. Post Debridement Measurements: 1.6cm length x 0.6cm width x 0.1cm depth; 0.075cm^3 volume. Character of Wound/Ulcer Post Debridement is improved. Post procedure Diagnosis Wound #8: Same as Pre-Procedure General Notes: Scribed for Dr. Celine Ahr by Martina Sinner, RN. Plan Follow-up Appointments: Return appointment in 1 month. - Dr. Celine Ahr Room 3 Anesthetic: Wound #2 Left,Medial Lower Leg: (In clinic) Topical Lidocaine 5% applied to wound bed Bathing/ Shower/ Hygiene: May shower and wash wound  with soap and water. Edema Control -  Lymphedema / SCD / Other: Avoid standing for long periods of time. Other Edema Control Orders/Instructions: - Elevate/Rest legs throughout the day WOUND #10: - T Third Wound Laterality: Left oe Cleanser: Soap and Water 1 x Per Day/30 Days Discharge Instructions: May shower and wash wound with dial antibacterial soap and water prior to dressing change. Peri-Wound Care: Sween Lotion (Moisturizing lotion) 1 x Per Day/30 Days Discharge Instructions: Apply moisturizing lotion as directed Prim Dressing: Sorbalgon AG Dressing, 4x4 (in/in) 1 x Per Day/30 Days ary Discharge Instructions: Apply to wound bed as instructed Com pression Wrap: Kerlix Roll 4.5x3.1 (in/yd) 1 x Per Day/30 Days Discharge Instructions: Apply Kerlix and Coban compression as directed. Com pression Wrap: Coban Self-Adherent Wrap 4x5 (in/yd) 1 x Per Day/30 Days Discharge Instructions: Apply over Kerlix as directed. WOUND #11: - Lower Leg Wound Laterality: Right, Anterior, Proximal Cleanser: Soap and Water 1 x Per Day/30 Days Discharge Instructions: May shower and wash wound with dial antibacterial soap and water prior to dressing change. Peri-Wound Care: Sween Lotion (Moisturizing lotion) 1 x Per Day/30 Days Discharge Instructions: Apply moisturizing lotion as directed Prim Dressing: Sorbalgon AG Dressing, 4x4 (in/in) 1 x Per Day/30 Days ary Discharge Instructions: Apply to wound bed as instructed Com pression Wrap: Kerlix Roll 4.5x3.1 (in/yd) 1 x Per Day/30 Days ELLANIE, BLATCHLEY ([id number]) [id number].pdf Page 17 of 20 Discharge Instructions: Apply Kerlix and Coban compression as directed. Com pression Wrap: Coban Self-Adherent Wrap 4x5 (in/yd) 1 x Per Day/30 Days Discharge Instructions: Apply over Kerlix as directed. WOUND #12: - Lower Leg Wound Laterality: Right, Lateral Cleanser: Soap and Water 1 x Per Day/30 Days Discharge Instructions: May shower and wash wound with dial antibacterial soap and  water prior to dressing change. Peri-Wound Care: Sween Lotion (Moisturizing lotion) 1 x Per Day/30 Days Discharge Instructions: Apply moisturizing lotion as directed Prim Dressing: Sorbalgon AG Dressing, 4x4 (in/in) 1 x Per Day/30 Days ary Discharge Instructions: Apply to wound bed as instructed Com pression Wrap: Kerlix Roll 4.5x3.1 (in/yd) 1 x Per Day/30 Days Discharge Instructions: Apply Kerlix and Coban compression as directed. Com pression Wrap: Coban Self-Adherent Wrap 4x5 (in/yd) 1 x Per Day/30 Days Discharge Instructions: Apply over Kerlix as directed. WOUND #13: - Lower Leg Wound Laterality: Right, Anterior, Distal Cleanser: Soap and Water 1 x Per Day/30 Days Discharge Instructions: May shower and wash wound with dial antibacterial soap and water prior to dressing change. Peri-Wound Care: Sween Lotion (Moisturizing lotion) 1 x Per Day/30 Days Discharge Instructions: Apply moisturizing lotion as directed Prim Dressing: Sorbalgon AG Dressing, 4x4 (in/in) 1 x Per Day/30 Days ary Discharge Instructions: Apply to wound bed as instructed Com pression Wrap: Kerlix Roll 4.5x3.1 (in/yd) 1 x Per Day/30 Days Discharge Instructions: Apply Kerlix and Coban compression as directed. Com pression Wrap: Coban Self-Adherent Wrap 4x5 (in/yd) 1 x Per Day/30 Days Discharge Instructions: Apply over Kerlix as directed. WOUND #14: - Lower Leg Wound Laterality: Left, Anterior Cleanser: Soap and Water 1 x Per Day/30 Days Discharge Instructions: May shower and wash wound with dial antibacterial soap and water prior to dressing change. Peri-Wound Care: Sween Lotion (Moisturizing lotion) 1 x Per Day/30 Days Discharge Instructions: Apply moisturizing lotion as directed Prim Dressing: Sorbalgon AG Dressing, 4x4 (in/in) 1 x Per Day/30 Days ary Discharge Instructions: Apply to wound bed as instructed Com pression Wrap: Kerlix Roll 4.5x3.1 (in/yd) 1 x Per Day/30 Days Discharge Instructions: Apply Kerlix and  Coban compression as directed. Com pression  Wrap: Coban Self-Adherent Wrap 4x5 (in/yd) 1 x Per Day/30 Days Discharge Instructions: Apply over Kerlix as directed. WOUND #2: - Lower Leg Wound Laterality: Left, Medial Cleanser: Soap and Water 1 x Per Day/30 Days Discharge Instructions: May shower and wash wound with dial antibacterial soap and water prior to dressing change. Peri-Wound Care: Sween Lotion (Moisturizing lotion) 1 x Per Day/30 Days Discharge Instructions: Apply moisturizing lotion as directed Prim Dressing: Sorbalgon AG Dressing, 4x4 (in/in) 1 x Per Day/30 Days ary Discharge Instructions: Apply to wound bed as instructed Com pression Wrap: Kerlix Roll 4.5x3.1 (in/yd) 1 x Per Day/30 Days Discharge Instructions: Apply Kerlix and Coban compression as directed. Com pression Wrap: Coban Self-Adherent Wrap 4x5 (in/yd) 1 x Per Day/30 Days Discharge Instructions: Apply over Kerlix as directed. WOUND #4: - Lower Leg Wound Laterality: Right, Medial Cleanser: Soap and Water 1 x Per Day/30 Days Discharge Instructions: May shower and wash wound with dial antibacterial soap and water prior to dressing change. Peri-Wound Care: Sween Lotion (Moisturizing lotion) 1 x Per Day/30 Days Discharge Instructions: Apply moisturizing lotion as directed Prim Dressing: Sorbalgon AG Dressing, 4x4 (in/in) 1 x Per Day/30 Days ary Discharge Instructions: Apply to wound bed as instructed Com pression Wrap: Kerlix Roll 4.5x3.1 (in/yd) 1 x Per Day/30 Days Discharge Instructions: Apply Kerlix and Coban compression as directed. Com pression Wrap: Coban Self-Adherent Wrap 4x5 (in/yd) 1 x Per Day/30 Days Discharge Instructions: Apply over Kerlix as directed. WOUND #5: - Lower Leg Wound Laterality: Left, Lateral Cleanser: Soap and Water 1 x Per Day/30 Days Discharge Instructions: May shower and wash wound with dial antibacterial soap and water prior to dressing change. Peri-Wound Care: Sween Lotion (Moisturizing  lotion) 1 x Per Day/30 Days Discharge Instructions: Apply moisturizing lotion as directed Prim Dressing: Sorbalgon AG Dressing, 4x4 (in/in) 1 x Per Day/30 Days ary Discharge Instructions: Apply to wound bed as instructed Com pression Wrap: Kerlix Roll 4.5x3.1 (in/yd) 1 x Per Day/30 Days Discharge Instructions: Apply Kerlix and Coban compression as directed. Com pression Wrap: Coban Self-Adherent Wrap 4x5 (in/yd) 1 x Per Day/30 Days Discharge Instructions: Apply over Kerlix as directed. WOUND #6: - Lower Leg Wound Laterality: Left, Posterior Cleanser: Soap and Water 1 x Per Day/30 Days Discharge Instructions: May shower and wash wound with dial antibacterial soap and water prior to dressing change. Peri-Wound Care: Sween Lotion (Moisturizing lotion) 1 x Per Day/30 Days Discharge Instructions: Apply moisturizing lotion as directed Prim Dressing: Sorbalgon AG Dressing, 4x4 (in/in) 1 x Per Day/30 Days ary Discharge Instructions: Apply to wound bed as instructed Com pression Wrap: Kerlix Roll 4.5x3.1 (in/yd) 1 x Per Day/30 Days Discharge Instructions: Apply Kerlix and Coban compression as directed. Com pression Wrap: Coban Self-Adherent Wrap 4x5 (in/yd) 1 x Per Day/30 Days Discharge Instructions: Apply over Kerlix as directed. WOUND #8: - Knee Wound Laterality: Right, Lateral Cleanser: Soap and Water 1 x Per Day/30 Days Discharge Instructions: May shower and wash wound with dial antibacterial soap and water prior to dressing change. Peri-Wound Care: Sween Lotion (Moisturizing lotion) 1 x Per Day/30 Days Discharge Instructions: Apply moisturizing lotion as directed Prim Dressing: Sorbalgon AG Dressing, 4x4 (in/in) 1 x Per Day/30 Days ary Discharge Instructions: Apply to wound bed as instructed Com pression Wrap: Kerlix Roll 4.5x3.1 (in/yd) 1 x Per Day/30 Days Discharge Instructions: Apply Kerlix and Coban compression as directed. Com pression Wrap: Coban Self-Adherent Wrap 4x5 (in/yd) 1 x  Per Day/30 Days Discharge Instructions: Apply over Kerlix as directed. MIU, SANDEFER ([id number]) [id number] Page  18 of 20 06/27/2022: The skin tear on her wrist has healed. She has a large hematoma on her right anterior leg, just lateral to the tibial surface. She has slough accumulation on all of her existing wounds. I used a curette to debride slough, eschar, and subcutaneous tissue off of her existing wounds. I then opened and evacuated the hematoma, removing clotted blood and slough. We will use silver alginate with Kerlix and Coban wrap to all of her wounds. Follow-up in 2 weeks. Electronic Signature(s) Signed: 06/27/2022 1:18:30 PM By: Fredirick Maudlin MD FACS Previous Signature: 06/27/2022 12:15:32 PM Version By: Fredirick Maudlin MD FACS Entered By: Fredirick Maudlin on 06/27/2022 13:18:30 -------------------------------------------------------------------------------- HxROS Details Patient Name: Date of Service: [name] [name], Cynthia ELYN Mitchell. 06/27/2022 11:00 A M Medical Record Number: [id number] Patient Account Number: [id number] Date of Birth/Sex: Treating RN: 21-Aug-1931 (89 y.o. F) Primary Care Provider: Deland Pretty Other Clinician: Referring Provider: Treating Provider/Extender: Raylene Everts in Treatment: 16 Information Obtained From Patient Eyes Medical History: Negative for: Cataracts; Glaucoma; Optic Neuritis Ear/Nose/Mouth/Throat Medical History: Negative for: Chronic sinus problems/congestion; Middle ear problems Hematologic/Lymphatic Medical History: Negative for: Anemia; Hemophilia; Human Immunodeficiency Virus; Lymphedema; Sickle Cell Disease Respiratory Medical History: Negative for: Aspiration; Asthma; Chronic Obstructive Pulmonary Disease (COPD); Pneumothorax; Sleep Apnea; Tuberculosis Cardiovascular Medical History: Positive for: Hypertension Gastrointestinal Medical History: Negative for: Cirrhosis ; Colitis;  Crohns; Hepatitis A; Hepatitis B Endocrine Medical History: Negative for: Type I Diabetes; Type II Diabetes Genitourinary Medical History: Negative for: End Stage Renal Disease Past Medical History Notes: polyneuropathy Integumentary (Skin) Medical History: Negative for: History of Burn SHARAH, DIRKSE ([id number]) [id number] Page 19 of 20 Musculoskeletal Medical History: Negative for: Gout; Rheumatoid Arthritis; Osteoarthritis; Osteomyelitis Neurologic Medical History: Positive for: Dementia Negative for: Neuropathy; Paraplegia; Seizure Disorder Psychiatric Medical History: Negative for: Anorexia/bulimia; Confinement Anxiety Immunizations Pneumococcal Vaccine: Received Pneumococcal Vaccination: Yes Received Pneumococcal Vaccination On or After 60th Birthday: Yes Implantable Devices None Hospitalization / Surgery History Type of Hospitalization/Surgery Orif 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 Breast surgery- unknown Family and Social History Unknown History: Yes; Never smoker; Marital Status - Married; Alcohol Use: Never; Drug Use: No History; Caffeine Use: Rarely; Financial Concerns: No; Food, Clothing or Shelter Needs: No; Support System Lacking: No; Transportation Concerns: No Electronic Signature(s) Signed: 06/27/2022 1:18:05 PM By: Fredirick Maudlin MD FACS Entered By: Fredirick Maudlin on 06/27/2022 12:11:23 -------------------------------------------------------------------------------- SuperBill Details Patient Name: Date of Service: [name] [name], Cynthia Marseilles 06/27/2022 Medical Record Number: [id number] Patient Account Number: [id number] Date of Birth/Sex: Treating RN: March 24, 1932 (89 y.o. 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 I82.492 Acute embolism and thrombosis of other specified deep vein of left lower extremity Dementia in other diseases classified elsewhere, unspecified severity, without behavioral disturbance, psychotic disturbance, mood F02.80 disturbance, and anxiety I82.491 Acute embolism and thrombosis of other specified deep vein of right lower extremity Facility Procedures : CPT4 Code: [id number] Description: [id number] - DEB SUBQ TISSUE 20 SQ CM/< ICD-10 Diagnosis Description [id number] Non-pressure chronic ulcer of other part of right lower leg with fat layer exp L97.822 Non-pressure chronic ulcer of other part of left lower leg with fat layer expo Modifier: osed sed Quantity: 1 : Shubert, CPT4 Code: [id number]  Melisa Mitchell [phone] Description: [id number] - DEB SUBQ TISS EA ADDL 20CM ICD-10 Diagnosis Description L97.812 Non-pressure chronic ulcer of other part of right lower leg with fat layer exp [phone]) [phone] L97.822 Non-pressure chronic ulcer of other part of left lower leg  with fat layer expose Modifier: osed [id number] Quantity: 1 7.pdf Page 20 of 20 Physician Procedures : CPT4: Description Modifier Code [id number] - WC PHYS LEVEL 3 - EST PT 25 ICD-10 Diagnosis Description [id number] Non-pressure chronic ulcer of other part of right lower leg with fat layer exposed L97.822 Non-pressure chronic ulcer of other part of left  lower leg with fat layer exposed F02.80 Dementia in other diseases classified elsewhere, unspecified severity, without behavioral disturbance, p disturbance, mood disturbance, and anxiety Quantity: 1 sychotic : CPT4: [id number] - WC PHYS SUBQ TISS 20 SQ CM ICD-10 Diagnosis Description L97.812 Non-pressure chronic ulcer of other part of right lower leg with fat layer exposed L97.822 Non-pressure chronic ulcer of other part of  left lower leg with fat layer  exposed Quantity: 1 : CPT4: [id number] - WC PHYS SUBQ TISS EA ADDL 20 CM ICD-10 Diagnosis Description [id number] Non-pressure chronic ulcer of other part of right lower leg with fat layer exposed L97.822 Non-pressure chronic ulcer of other part of left lower leg with fat  layer exposed Quantity: 1 Electronic Signature(s) Signed: 06/27/2022 1:18:39 PM By: Fredirick Maudlin MD FACS Previous Signature: 06/27/2022 12:16:16 PM Version By: Fredirick Maudlin MD FACS Entered By: Fredirick Maudlin on 06/27/2022 13:18:39

## 2022-06-28 NOTE — Progress Notes (Signed)
[name], [name] ([id number]) [id number] Page 1 of 21 Visit Report for 06/27/2022 Arrival Information Details Patient Name: Date of Service: [name] [name] 06/27/2022 11:00 A M Medical Record Number: [id number] Patient Account Number: [id number] Date of Birth/Sex: Treating RN: [birth year] (89 y.o. [name] Primary Care [name]: Deland Pretty Other Clinician: Referring [name]: Treating Eleora Sutherland/Extender: [name] in Treatment: 63 Visit Information History Since Last Visit Added or deleted any medications: No Patient Arrived: Wheel Chair Any new allergies or adverse reactions: No Arrival Time: 10:58 Had a fall or experienced change in No Accompanied By: spouse activities of daily living that may affect Transfer Assistance: Manual risk of falls: Patient Identification Verified: Yes Signs or symptoms of abuse/neglect since last visito No Patient Requires Transmission-Based Precautions: No Hospitalized since last visit: No Patient Has Alerts: No Implantable device outside of the clinic excluding No cellular tissue based products placed in the center since last visit: Has Dressing in Place as Prescribed: Yes Has Compression in Place as Prescribed: Yes Pain Present Now: No Electronic Signature(s) Signed: 06/27/2022 4:50:01 PM By: Cynthia Catholic RN Entered By: [name] on 06/27/2022 10:59:27 -------------------------------------------------------------------------------- Encounter Discharge Information Details Patient Name: Date of Service: [name] [name], [name]. 06/27/2022 11:00 A M Medical Record Number: [id number] Patient Account Number: [id number] Date of Birth/Sex: Treating RN: [name] (89 y.o. [name] Primary Care [name] Encounter Discharge Information  Items Post Procedure Vitals Discharge Condition: Stable Temperature (F): 98.3 Ambulatory Status: Wheelchair Pulse (bpm): 93 Discharge Destination: Home Respiratory Rate (breaths/min): Mitchell Transportation: Private Auto Blood Pressure (mmHg): 120/80 Accompanied By: spouse Schedule Follow-up Appointment: Yes Clinical Summary of Care: Patient Declined Electronic Signature(s) Signed: 06/27/2022 4:50:01 PM By: Cynthia Catholic RN Entered By: [name] on 06/27/2022 Mitchell:12:35 -------------------------------------------------------------------------------- Lower Extremity Assessment Details Patient Name: Date of Service: [name] [name] 06/27/2022 11:00 A M Medical Record Number: [id number] Patient Account Number: [id number] Date of Birth/Sex: Treating RN: [birth year] Mitchell (89 y.o. [name] Primary Care Crescentia Boutwell: Deland Pretty Other Clinician: Referring Kayl Stogdill: Treating [name]/Extender: [name] in Treatment: Mitchell Edema Assessment E[Left: [name] [id number] Cynthia [id number].pdf Page 2 of 21] Assessed: [Left: No] [Right: No] [Left: Edema] [Right: :] Calf Left: Right: Point of Measurement: From Medial Instep 27.5 cm 27.5 cm Ankle Left: Right: Point of Measurement: From Medial Instep 19.5 cm 20 cm Vascular Assessment Pulses: Dorsalis Pedis Palpable: [Left:Yes] [Right:Yes] Electronic Signature(s) Signed: 06/27/2022 4:50:01 PM By: Cynthia Catholic RN Entered By: [name] on 06/27/2022 11:00:07 -------------------------------------------------------------------------------- Multi Wound Chart Details Patient Name: Date of Service: [name] [name], [name]. 06/27/2022 11:00 A M Medical Record Number: [id number] Patient Account Number: [id number] Date of Birth/Sex: Treating RN: [birth year] (89 y.o. F) Primary Care [name]: Deland Pretty Other Clinician: Referring [name]: Treating [name]/Extender: [name] in Treatment: Mitchell Vital Signs Height(in): 83 Pulse(bpm): 93 Weight(lbs): 133 Blood Pressure(mmHg): 120/80 Body Mass Index(BMI): 21.5 Temperature(F): 98.3 Respiratory Rate(breaths/min): Mitchell [10:Photos:] Left T Third oe Right, Proximal, Anterior Lower Leg Right, Lateral Lower Leg Wound Location: Blister Trauma Trauma Wounding Event: Pressure Ulcer Skin Tear Skin Tear Primary Etiology: Hypertension, Dementia Hypertension, Dementia Hypertension, Dementia Comorbid History: 05/12/2022 06/22/2022 06/22/2022 Date Acquired: 6 0 0 Weeks of Treatment: Open Open Open Wound Status: No No No Wound Recurrence: No Yes Yes Clustered Wound: 0.4x0.4x0.1 6x4x0.2 8.5x4x0.1 Measurements L x W x D (cm) 0.126  18.85 26.704 A (cm) : rea 0.013 3.77 2.67 Volume (cm) : -306.50% N/A N/A % Reduction in A rea: -333.30% N/A N/A % Reduction in Volume: Category/Stage II Full Thickness Without Exposed Full Thickness Without Exposed Classification: Support Structures Support Structures Medium Medium Medium Exudate A mount: Serosanguineous Serosanguineous Serosanguineous Exudate Type: red, Mitchell red, Mitchell red, Mitchell Exudate Color: Small (1-33%) Medium (34-66%) Medium (34-66%) Granulation Amount: Red Red, Hyper-granulation Pink, Pale Granulation Quality: Large (67-100%) Medium (34-66%) Medium (34-66%) Necrotic Amount: Eschar, Adherent Slough Eschar, Adherent Peletier Necrotic Tissue: Fat Layer (Subcutaneous Tissue): Yes Fat Layer (Subcutaneous Tissue): Yes Fat Layer (Subcutaneous Tissue): Yes Exposed StructuresSENIYA, [name] ([id number].pdf Page 3 of 21 Fascia: No Fascia: No Fascia: No Tendon: No Tendon: No Tendon: No Muscle: No Muscle: No Muscle: No Joint: No Joint: No Joint: No Bone: No Bone: No Bone: No Small (1-33%) N/A None Epithelialization: No Abnormalities Noted No Abnormalities  Noted No Abnormalities Noted Periwound Skin Texture: No Abnormalities Noted No Abnormalities Noted No Abnormalities Noted Periwound Skin Moisture: No Abnormalities Noted No Abnormalities Noted No Abnormalities Noted Periwound Skin Color: No Abnormality No Abnormality No Abnormality Temperature: Wound Number: '13 14 2 '$ Photos: Right, Distal, Anterior Lower Leg Left, Anterior Lower Leg Left, Medial Lower Leg Wound Location: Trauma Trauma Skin T ear/Laceration Wounding Event: Skin T ear Skin T ear Venous Leg Ulcer Primary Etiology: Hypertension, Dementia Hypertension, Dementia Hypertension, Dementia Comorbid History: 06/22/2022 06/22/2022 02/04/2022 Date Acquired: 0 0 Mitchell Weeks of Treatment: Open Open Open Wound Status: No No No Wound Recurrence: Yes No Yes Clustered Wound: 9x5.5x0.1 3.5x3.2x0.1 0.1x0.1x0.1 Measurements L x W x D (cm) 38.877 8.796 0.008 A (cm) : rea 3.888 0.88 0.001 Volume (cm) : N/A N/A 99.90% % Reduction in A rea: N/A N/A 99.90% % Reduction in Volume: Full Thickness Without Exposed Full Thickness Without Exposed Full Thickness Without Exposed Classification: Support Structures Support Structures Support Structures Medium Medium None Present Exudate A mount: Serosanguineous Serosanguineous N/A Exudate Type: red, Mitchell red, Mitchell N/A Exudate Color: Large (67-100%) Small (1-33%) Small (1-33%) Granulation Amount: Red Pink, Pale Red, Hyper-granulation, Friable Granulation Quality: Small (1-33%) Large (67-100%) Large (67-100%) Necrotic Amount: Eschar, Adherent Slough Eschar, Adherent [facility], Adherent Slough Necrotic Tissue: Fat Layer (Subcutaneous Tissue): Yes Fascia: No Fascia: No Exposed Structures: Fascia: No Fat Layer (Subcutaneous Tissue): No Fat Layer (Subcutaneous Tissue): No Tendon: No Tendon: No Tendon: No Muscle: No Muscle: No Muscle: No Joint: No Joint: No Joint: No Bone: No Bone: No Bone: No None None Small  (1-33%) Epithelialization: No Abnormalities Noted No Abnormalities Noted No Abnormalities Noted Periwound Skin Texture: No Abnormalities Noted No Abnormalities Noted Maceration: No Periwound Skin Moisture: Dry/Scaly: No No Abnormalities Noted No Abnormalities Noted No Abnormalities Noted Periwound Skin Color: No Abnormality No Abnormality No Abnormality Temperature: Wound Number: '4 5 6 '$ Photos: Right, Medial Lower Leg Left, Lateral Lower Leg Left, Posterior Lower Leg Wound Location: Blister Blister Blister Wounding Event: Cellulitis Venous Leg Ulcer Venous Leg Ulcer Primary Etiology: Hypertension, Dementia Hypertension, Dementia Hypertension, Dementia Comorbid History: 04/18/2022 04/17/2022 04/17/2022 Date Acquired: '9 9 9 '$ Weeks of Treatment: Open Open Open Wound Status: No No No Wound Recurrence: No No No Clustered Wound: 2.3x2.2x0.1 2x1.5x0.1 7.8x3x0.1 Measurements L x W x D (cm) 3.974 2.356 18.378 A (cm) : rea 0.397 0.236 1.838 Volume (cm) : 46.70% 37.50% -2826.40% % Reduction in A rea: 46.80% 37.40% -2817.50% % Reduction in Volume: Full Thickness Without Exposed Full Thickness Without Exposed Full Thickness Without Exposed Classification:  Support Structures Support Structures Support Structures [place], [place] Mitchell ([id number]) [id number] Page 4 of 21 Medium None Present None Present Exudate A mount: Serosanguineous N/A N/A Exudate Type: red, Mitchell N/A N/A Exudate Color: Small (1-33%) Small (1-33%) Small (1-33%) Granulation Amount: Red, Pink Red, Hyper-granulation Pink, Pale Granulation Quality: Large (67-100%) Large (67-100%) Large (67-100%) Necrotic Amount: Eschar, Adherent Slough Eschar, Adherent Slough Eschar, Adherent Slough Necrotic Tissue: N/A Fascia: No Fat Layer (Subcutaneous Tissue): Yes Exposed Structures: Fat Layer (Subcutaneous Tissue): No Fascia: No Tendon: No Tendon: No Muscle: No Muscle: No Joint: No Joint:  No Bone: No Bone: No Small (1-33%) None Small (1-33%) Epithelialization: No Abnormalities Noted No Abnormalities Noted No Abnormalities Noted Periwound Skin Texture: Maceration: No Maceration: No Periwound Skin Moisture: Dry/Scaly: No Dry/Scaly: No Rubor: Yes Rubor: Yes Rubor: Yes Periwound Skin Color: No Abnormality No Abnormality N/A Temperature: Wound Number: '7 8 9 '$ Photos: No Photos No Photos Left Wrist Right, Lateral Knee Left, Posterior Knee Wound Location: Hematoma Skin T ear/Laceration Skin T ear/Laceration Wounding Event: Trauma, Other Trauma, Other Trauma, Other Primary Etiology: Hypertension, Dementia Hypertension, Dementia Hypertension, Dementia Comorbid History: 05/12/2022 05/12/2022 05/11/2022 Date Acquired: '6 6 6 '$ Weeks of Treatment: Healed - Epithelialized Open Healed - Epithelialized Wound Status: No No No Wound Recurrence: No No No Clustered Wound: 0x0x0 1.6x0.6x0.1 0x0x0 Measurements L x W x D (cm) 0 0.754 0 A (cm) : rea 0 0.075 0 Volume (cm) : 100.00% -113.60% 100.00% % Reduction in A rea: 100.00% -114.30% 100.00% % Reduction in Volume: Full Thickness Without Exposed Full Thickness Without Exposed Full Thickness Without Exposed Classification: Support Structures Support Structures Support Structures None Present Medium None Present Exudate A mount: N/A Serosanguineous N/A Exudate Type: N/A red, Mitchell N/A Exudate Color: None Present (0%) Small (1-33%) None Present (0%) Granulation Amount: N/A Pink N/A Granulation Quality: None Present (0%) Large (67-100%) None Present (0%) Necrotic Amount: N/A Eschar, Adherent Slough N/A Necrotic Tissue: Fascia: No Fat Layer (Subcutaneous Tissue): Yes Fascia: No Exposed Structures: Fat Layer (Subcutaneous Tissue): No Fascia: No Fat Layer (Subcutaneous Tissue): No Tendon: No Tendon: No Tendon: No Muscle: No Muscle: No Muscle: No Joint: No Joint: No Joint: No Bone: No Bone: No Bone:  No None Small (1-33%) Large (67-100%) Epithelialization: No Abnormalities Noted No Abnormalities Noted No Abnormalities Noted Periwound Skin Texture: No Abnormalities Noted No Abnormalities Noted No Abnormalities Noted Periwound Skin Moisture: No Abnormalities Noted No Abnormalities Noted No Abnormalities Noted Periwound Skin Color: No Abnormality No Abnormality No Abnormality Temperature: Treatment Notes Electronic Signature(s) Signed: 06/27/2022 12:09:39 PM By: Fredirick Maudlin MD FACS Entered By: Fredirick Maudlin on 06/27/2022 12:09:38 -------------------------------------------------------------------------------- Multi-Disciplinary Care Plan Details Patient Name: Date of Service: [name] [name], [name]. 06/27/2022 11:00 A M Medical Record Number: [id number] Patient Account Number: [id number] Date of Birth/Sex: Treating RN: [birth year] (89 y.o. [name] Primary Care Markisha Meding: Deland Pretty Other Clinician: ARMILLA, [name] ([id number]) [id number] Page 5 of 21 Referring Dairon Procter: Treating Heiress Williamson/Extender: [name] in Treatment: Mitchell Active Inactive Orientation to the Wound Care Program Nursing Diagnoses: Knowledge deficit related to the wound healing center program Goals: Patient/caregiver will verbalize understanding of the Gray Date Initiated: 03/01/2022 Target Resolution Date: 05/17/2023 Goal Status: Active Interventions: Provide education on orientation to the wound center Notes: Venous Leg Ulcer Nursing Diagnoses: Potential for venous Insuffiency (use before diagnosis confirmed) Goals: Patient will maintain optimal edema control Date Initiated: 03/01/2022 Target Resolution Date: 05/17/2023 Goal Status: Active Interventions: Provide education on venous insufficiency Treatment  Activities: Therapeutic compression applied : 03/01/2022 Notes: Wound/Skin Impairment Nursing  Diagnoses: Knowledge deficit related to ulceration/compromised skin integrity Goals: Ulcer/skin breakdown will have a volume reduction of 30% by week 4 Date Initiated: 03/01/2022 Date Inactivated: 03/31/2022 Target Resolution Date: 03/29/2022 Goal Status: Met Ulcer/skin breakdown will have a volume reduction of 50% by week 8 Date Initiated: 03/31/2022 Target Resolution Date: 05/17/2023 Goal Status: Active Interventions: Assess ulceration(s) every visit Treatment Activities: Topical wound management initiated : 03/01/2022 Notes: Electronic Signature(s) Signed: 06/27/2022 4:50:01 PM By: Cynthia Catholic RN Entered By: [name] on 06/27/2022 Mitchell:08:35 -------------------------------------------------------------------------------- Pain Assessment Details Patient Name: Date of Service: [name] [name], [name] 06/27/2022 11:00 A M Medical Record Number: [id number] Patient Account Number: [id number] Date of Birth/Sex: Treating RN: [birth year] Mitchell (89 y.o. [name] Primary Care Dalayla Aldredge: Deland Pretty Other Clinician: Referring Jaimie Pippins: Treating Issak Goley/Extender: [name] in Treatment: [address], [place] Mitchell ([id number]) [id number] Page 6 of 21 Active Problems Location of Pain Severity and Description of Pain Patient Has Paino No Site Locations Pain Management and Medication Current Pain Management: Electronic Signature(s) Signed: 06/27/2022 4:50:01 PM By: Cynthia Catholic RN Entered By: [name] on 06/27/2022 10:59:59 -------------------------------------------------------------------------------- Patient/Caregiver Education Details Patient Name: Date of Service: [name] [name], EV ELYN Mitchell. 3/12/2024andnbsp11:00 A M Medical Record Number: [id number] Patient Account Number: [id number] Date of Birth/Gender: Treating RN: [birth year] Mitchell (89 y.o. [name] Primary Care Physician: Deland Pretty Other Clinician: Referring  Physician: Treating Physician/Extender: [name] in Treatment: Mitchell Education Assessment Education Provided To: Patient Education Topics Provided Wound/Skin Impairment: Methods: Explain/Verbal Responses: Return demonstration correctly Electronic Signature(s) Signed: 06/27/2022 4:50:01 PM By: Cynthia Catholic RN Entered By: [name] on 06/27/2022 Mitchell:09:21 -------------------------------------------------------------------------------- Wound Assessment Details Patient Name: Date of Service: [name] [name], [name] 06/27/2022 11:00 A M Medical Record Number: [id number] Patient Account Number: [id number] Date of Birth/Sex: Treating RN: Cynthia 03, Mitchell (89 y.o. [name] Primary Care Markian Glockner: Deland Pretty Other Clinician: Referring Mateja Dier: Treating Nishant Schrecengost/Extender: [name] in Treatment: [address] JERICCA, WILLERS ([id number]) [id number] Page 7 of 21 Wound Number: 10 Primary Etiology: Pressure Ulcer Wound Location: Left T Third oe Wound Status: Open Wounding Event: Blister Comorbid History: Hypertension, Dementia Date Acquired: 05/12/2022 Weeks Of Treatment: 6 Clustered Wound: No Photos Wound Measurements Length: (cm) 0.4 Width: (cm) 0.4 Depth: (cm) 0.1 Area: (cm) 0.126 Volume: (cm) 0.013 % Reduction in Area: -306.5% % Reduction in Volume: -333.3% Epithelialization: Small (1-33%) Tunneling: No Undermining: No Wound Description Classification: Category/Stage II Exudate Amount: Medium Exudate Type: Serosanguineous Exudate Color: red, Mitchell Foul Odor After Cleansing: No Slough/Fibrino Yes Wound Bed Granulation Amount: Small (1-33%) Exposed Structure Granulation Quality: Red Fascia Exposed: No Necrotic Amount: Large (67-100%) Fat Layer (Subcutaneous Tissue) Exposed: Yes Necrotic Quality: Eschar, Adherent Slough Tendon Exposed: No Muscle Exposed: No Joint Exposed:  No Bone Exposed: No Periwound Skin Texture Texture Color No Abnormalities Noted: Yes No Abnormalities Noted: Yes Moisture Temperature / Pain No Abnormalities Noted: Yes Temperature: No Abnormality Treatment Notes Wound #10 (Toe Third) Wound Laterality: Left Cleanser Soap and Water Discharge Instruction: May shower and wash wound with dial antibacterial soap and water prior to dressing change. Peri-Wound Care Sween Lotion (Moisturizing lotion) Discharge Instruction: Apply moisturizing lotion as directed Topical Primary Dressing Sorbalgon AG Dressing, 4x4 (in/in) Discharge Instruction: Apply to wound bed as instructed Secondary Dressing Secured With Compression Wrap Kerlix Roll 4.5x3.1 (in/yd) Discharge Instruction: Apply Kerlix and Coban compression as directed. [name], [name] ([id number]) [id number] Page  8 of 21 Coban Self-Adherent Wrap 4x5 (in/yd) Discharge Instruction: Apply over Kerlix as directed. Compression Stockings Add-Ons Electronic Signature(s) Signed: 06/27/2022 12:51:50 PM By: Worthy Rancher Signed: 06/27/2022 4:50:01 PM By: Cynthia Catholic RN Entered By: Worthy Rancher on 06/27/2022 11:24:44 -------------------------------------------------------------------------------- Wound Assessment Details Patient Name: Date of Service: [name] [name], [name]. 06/27/2022 11:00 A M Medical Record Number: [id number] Patient Account Number: [id number] Date of Birth/Sex: Treating RN: Mitchell[birth year] (89 y.o. [name] Primary Care Jane Birkel: Deland Pretty Other Clinician: Referring Divit Stipp: Treating Aireonna Bauer/Extender: [name] in Treatment: Mitchell Wound Status Wound Number: 11 Primary Etiology: Skin Tear Wound Location: Right, Proximal, Anterior Lower Leg Wound Status: Open Wounding Event: Trauma Comorbid History: Hypertension, Dementia Date Acquired: 06/22/2022 Weeks Of Treatment: 0 Clustered Wound: Yes Photos Wound  Measurements Length: (cm) 6 Width: (cm) 4 Depth: (cm) 0.2 Area: (cm) 18.85 Volume: (cm) 3.77 % Reduction in Area: % Reduction in Volume: Tunneling: No Undermining: No Wound Description Classification: Full Thickness Without Exposed Support Exudate Amount: Medium Exudate Type: Serosanguineous Exudate Color: red, Mitchell Structures Foul Odor After Cleansing: No Slough/Fibrino Yes Wound Bed Granulation Amount: Medium (34-66%) Exposed Structure Granulation Quality: Red, Hyper-granulation Fascia Exposed: No Necrotic Amount: Medium (34-66%) Fat Layer (Subcutaneous Tissue) Exposed: Yes Necrotic Quality: Eschar, Adherent Slough Tendon Exposed: No Muscle Exposed: No Joint Exposed: No Bone Exposed: No Periwound Skin Texture Texture Color No Abnormalities Noted: Yes No Abnormalities Noted: Yes Moisture Temperature / Pain No Abnormalities Noted: Yes Temperature: No Abnormality [name], [name] ([id number]) [phone].pdf Page 9 of 21 Treatment Notes Wound #11 (Lower Leg) Wound Laterality: Right, Anterior, Proximal Cleanser Soap and Water Discharge Instruction: May shower and wash wound with dial antibacterial soap and water prior to dressing change. Peri-Wound Care Sween Lotion (Moisturizing lotion) Discharge Instruction: Apply moisturizing lotion as directed Topical Primary Dressing Sorbalgon AG Dressing, 4x4 (in/in) Discharge Instruction: Apply to wound bed as instructed Secondary Dressing Secured With Compression Wrap Kerlix Roll 4.5x3.1 (in/yd) Discharge Instruction: Apply Kerlix and Coban compression as directed. Coban Self-Adherent Wrap 4x5 (in/yd) Discharge Instruction: Apply over Kerlix as directed. Compression Stockings Add-Ons Electronic Signature(s) Signed: 06/27/2022 12:51:50 PM By: Worthy Rancher Signed: 06/27/2022 4:50:01 PM By: Cynthia Catholic RN Entered By: Worthy Rancher on 06/27/2022  11:22:04 -------------------------------------------------------------------------------- Wound Assessment Details Patient Name: Date of Service: [name] [name], [name]. 06/27/2022 11:00 A M Medical Record Number: [id number] Patient Account Number: [id number] Date of Birth/Sex: Treating RN: [birth year] Mitchell (89 y.o. [name] Primary Care [place]: Deland Pretty Other Clinician: Referring Jamyson Jirak: Treating Jeniyah Menor/Extender: [name] in Treatment: Mitchell Wound Status Wound Number: 12 Primary Etiology: Skin Tear Wound Location: Right, Lateral Lower Leg Wound Status: Open Wounding Event: Trauma Comorbid History: Hypertension, Dementia Date Acquired: 06/22/2022 Weeks Of Treatment: 0 Clustered Wound: Yes Photos Wound Measurements Length: (cm) 8.5 Width: (cm) 4 [name], [name] ([id number]) Depth: (cm) 0.1 Area: (cm) 26.704 Volume: (cm) 2.67 % Reduction in Area: % Reduction in Volume: [id number] Page 10 of 21 Epithelialization: None Tunneling: No Undermining: No Wound Description Classification: Full Thickness Without Exposed Support Exudate Amount: Medium Exudate Type: Serosanguineous Exudate Color: red, Mitchell Structures Foul Odor After Cleansing: No Slough/Fibrino Yes Wound Bed Granulation Amount: Medium (34-66%) Exposed Structure Granulation Quality: Pink, Pale Fascia Exposed: No Necrotic Amount: Medium (34-66%) Fat Layer (Subcutaneous Tissue) Exposed: Yes Necrotic Quality: Eschar, Adherent Slough Tendon Exposed: No Muscle Exposed: No Joint Exposed: No Bone Exposed: No Periwound Skin Texture Texture Color No Abnormalities Noted: Yes No Abnormalities Noted: Yes Moisture  Temperature / Pain No Abnormalities Noted: Yes Temperature: No Abnormality Treatment Notes Wound #12 (Lower Leg) Wound Laterality: Right, Lateral Cleanser Soap and Water Discharge Instruction: May shower and wash wound with dial  antibacterial soap and water prior to dressing change. Peri-Wound Care Sween Lotion (Moisturizing lotion) Discharge Instruction: Apply moisturizing lotion as directed Topical Primary Dressing Sorbalgon AG Dressing, 4x4 (in/in) Discharge Instruction: Apply to wound bed as instructed Secondary Dressing Secured With Compression Wrap Kerlix Roll 4.5x3.1 (in/yd) Discharge Instruction: Apply Kerlix and Coban compression as directed. Coban Self-Adherent Wrap 4x5 (in/yd) Discharge Instruction: Apply over Kerlix as directed. Compression Stockings Add-Ons Electronic Signature(s) Signed: 06/27/2022 12:51:50 PM By: Worthy Rancher Signed: 06/27/2022 4:50:01 PM By: Cynthia Catholic RN Entered By: Worthy Rancher on 06/27/2022 11:22:44 -------------------------------------------------------------------------------- Wound Assessment Details Patient Name: Date of Service: [name] [name], [name]. 06/27/2022 11:00 A M Medical Record Number: [id number] Patient Account Number: [id number] Date of Birth/Sex: Treating RN: [birth year] (89 y.o. [name] Primary Care Perline Awe: Deland Pretty Other Clinician: Referring Maysen Sudol: Treating Antonyo Hinderer/Extender: [name] in Treatment: [address] [place] Mitchell ([id number]) [id number] Page 11 of 21 Wound Status Wound Number: 13 Primary Etiology: Skin Tear Wound Location: Right, Distal, Anterior Lower Leg Wound Status: Open Wounding Event: Trauma Comorbid History: Hypertension, Dementia Date Acquired: 06/22/2022 Weeks Of Treatment: 0 Clustered Wound: Yes Photos Wound Measurements Length: (cm) 9 Width: (cm) 5.5 Depth: (cm) 0.1 Area: (cm) 38.877 Volume: (cm) 3.888 % Reduction in Area: % Reduction in Volume: Epithelialization: None Tunneling: No Undermining: No Wound Description Classification: Full Thickness Without Exposed Support Exudate Amount: Medium Exudate Type: Serosanguineous Exudate Color: red,  Mitchell Structures Foul Odor After Cleansing: No Slough/Fibrino Yes Wound Bed Granulation Amount: Large (67-100%) Exposed Structure Granulation Quality: Red Fascia Exposed: No Necrotic Amount: Small (1-33%) Fat Layer (Subcutaneous Tissue) Exposed: Yes Necrotic Quality: Eschar, Adherent Slough Tendon Exposed: No Muscle Exposed: No Joint Exposed: No Bone Exposed: No Periwound Skin Texture Texture Color No Abnormalities Noted: Yes No Abnormalities Noted: Yes Moisture Temperature / Pain No Abnormalities Noted: Yes Temperature: No Abnormality Treatment Notes Wound #13 (Lower Leg) Wound Laterality: Right, Anterior, Distal Cleanser Soap and Water Discharge Instruction: May shower and wash wound with dial antibacterial soap and water prior to dressing change. Peri-Wound Care Sween Lotion (Moisturizing lotion) Discharge Instruction: Apply moisturizing lotion as directed Topical Primary Dressing Sorbalgon AG Dressing, 4x4 (in/in) Discharge Instruction: Apply to wound bed as instructed Secondary Dressing Secured With Compression Wrap Kerlix Roll 4.5x3.1 (in/yd) [name], [name] ([id number]) [phone].pdf Page 12 of 21 Discharge Instruction: Apply Kerlix and Coban compression as directed. Coban Self-Adherent Wrap 4x5 (in/yd) Discharge Instruction: Apply over Kerlix as directed. Compression Stockings Add-Ons Electronic Signature(s) Signed: 06/27/2022 12:51:50 PM By: Worthy Rancher Signed: 06/27/2022 4:50:01 PM By: Cynthia Catholic RN Entered By: Worthy Rancher on 06/27/2022 11:23:21 -------------------------------------------------------------------------------- Wound Assessment Details Patient Name: Date of Service: [name] [name], [name]. 06/27/2022 11:00 A M Medical Record Number: [id number] Patient Account Number: [id number] Date of Birth/Sex: Treating RN: [birth year] (89 y.o. [name] Primary Care Alter Moss: Deland Pretty Other Clinician: Referring  Doneta Bayman: Treating Roschelle Calandra/Extender: [name] in Treatment: Mitchell Wound Status Wound Number: 14 Primary Etiology: Skin Tear Wound Location: Left, Anterior Lower Leg Wound Status: Open Wounding Event: Trauma Comorbid History: Hypertension, Dementia Date Acquired: 06/22/2022 Weeks Of Treatment: 0 Clustered Wound: No Photos Wound Measurements Length: (cm) 3.5 Width: (cm) 3.2 Depth: (cm) 0.1 Area: (cm) 8.796 Volume: (cm) 0.88 % Reduction in Area: % Reduction in Volume:  Epithelialization: None Tunneling: No Undermining: No Wound Description Classification: Full Thickness Without Exposed Support Exudate Amount: Medium Exudate Type: Serosanguineous Exudate Color: red, Mitchell Structures Foul Odor After Cleansing: No Slough/Fibrino Yes Wound Bed Granulation Amount: Small (1-33%) Exposed Structure Granulation Quality: Pink, Pale Fascia Exposed: No Necrotic Amount: Large (67-100%) Fat Layer (Subcutaneous Tissue) Exposed: No Necrotic Quality: Eschar, Adherent Slough Tendon Exposed: No Muscle Exposed: No Joint Exposed: No Bone Exposed: No Periwound Skin Texture Texture Color No Abnormalities Noted: Yes No Abnormalities Noted: Yes Moisture Temperature / Pain [name], [name] ([id number].pdf Page 13 of 21 No Abnormalities Noted: Yes Temperature: No Abnormality Treatment Notes Wound #14 (Lower Leg) Wound Laterality: Left, Anterior Cleanser Soap and Water Discharge Instruction: May shower and wash wound with dial antibacterial soap and water prior to dressing change. Peri-Wound Care Sween Lotion (Moisturizing lotion) Discharge Instruction: Apply moisturizing lotion as directed Topical Primary Dressing Sorbalgon AG Dressing, 4x4 (in/in) Discharge Instruction: Apply to wound bed as instructed Secondary Dressing Secured With Compression Wrap Kerlix Roll 4.5x3.1 (in/yd) Discharge Instruction: Apply Kerlix and  Coban compression as directed. Coban Self-Adherent Wrap 4x5 (in/yd) Discharge Instruction: Apply over Kerlix as directed. Compression Stockings Add-Ons Electronic Signature(s) Signed: 06/27/2022 12:51:50 PM By: Worthy Rancher Signed: 06/27/2022 4:50:01 PM By: Cynthia Catholic RN Entered By: Worthy Rancher on 06/27/2022 11:34:47 -------------------------------------------------------------------------------- Wound Assessment Details Patient Name: Date of Service: [name] [name], [name]. 06/27/2022 11:00 A M Medical Record Number: [id number] Patient Account Number: [id number] Date of Birth/Sex: Treating RN: [birth year]Mitchell (89 y.o. [name] Primary Care Laquanda Bick: Deland Pretty Other Clinician: Referring Juvia Aerts: Treating Jaliel Deavers/Extender: [name] in Treatment: Mitchell Wound Status Wound Number: 2 Primary Etiology: Venous Leg Ulcer Wound Location: Left, Medial Lower Leg Wound Status: Open Wounding Event: Skin Tear/Laceration Comorbid History: Hypertension, Dementia Date Acquired: 02/04/2022 Weeks Of Treatment: Mitchell Clustered Wound: Yes Photos Wound Measurements [name], [name] ([id number]) Length: (cm) 0.1 Width: (cm) 0.1 Depth: (cm) 0.1 Area: (cm) 0.008 Volume: (cm) 0.001 [id number] Page 14 of 21 % Reduction in Area: 99.9% % Reduction in Volume: 99.9% Epithelialization: Small (1-33%) Wound Description Classification: Full Thickness Without Exposed Support Structures Exudate Amount: None Present Foul Odor After Cleansing: No Slough/Fibrino Yes Wound Bed Granulation Amount: Small (1-33%) Exposed Structure Granulation Quality: Red, Hyper-granulation, Friable Fascia Exposed: No Necrotic Amount: Large (67-100%) Fat Layer (Subcutaneous Tissue) Exposed: No Necrotic Quality: Eschar, Adherent Slough Tendon Exposed: No Muscle Exposed: No Joint Exposed: No Bone Exposed: No Periwound Skin Texture Texture Color No Abnormalities  Noted: Yes No Abnormalities Noted: Yes Moisture Temperature / Pain No Abnormalities Noted: Yes Temperature: No Abnormality Treatment Notes Wound #2 (Lower Leg) Wound Laterality: Left, Medial Cleanser Soap and Water Discharge Instruction: May shower and wash wound with dial antibacterial soap and water prior to dressing change. Peri-Wound Care Sween Lotion (Moisturizing lotion) Discharge Instruction: Apply moisturizing lotion as directed Topical Primary Dressing Sorbalgon AG Dressing, 4x4 (in/in) Discharge Instruction: Apply to wound bed as instructed Secondary Dressing Secured With Compression Wrap Kerlix Roll 4.5x3.1 (in/yd) Discharge Instruction: Apply Kerlix and Coban compression as directed. Coban Self-Adherent Wrap 4x5 (in/yd) Discharge Instruction: Apply over Kerlix as directed. Compression Stockings Add-Ons Electronic Signature(s) Signed: 06/27/2022 12:51:50 PM By: Worthy Rancher Signed: 06/27/2022 4:50:01 PM By: Cynthia Catholic RN Entered By: Worthy Rancher on 06/27/2022 11:25:39 -------------------------------------------------------------------------------- Wound Assessment Details Patient Name: Date of Service: [name] [name], [name]. 06/27/2022 11:00 A M Medical Record Number: [id number] Patient Account Number: [id number] Date of Birth/Sex: Treating RN: [birth year] Mitchell (89 y.o. F)  [name] Primary Care Lilliann Rossetti: Deland Pretty Other Clinician: Referring Barlow Harrison: Treating Shomari Scicchitano/Extender: [name] in Treatment: [address], [place] Mitchell ([id number]) [id number] Page 15 of 21 Wound Status Wound Number: 4 Primary Etiology: Cellulitis Wound Location: Right, Medial Lower Leg Wound Status: Open Wounding Event: Blister Comorbid History: Hypertension, Dementia Date Acquired: 04/18/2022 Weeks Of Treatment: 9 Clustered Wound: No Photos Wound Measurements Length: (cm) 2.3 Width: (cm) 2.2 Depth: (cm) 0.1 Area: (cm)  3.974 Volume: (cm) 0.397 % Reduction in Area: 46.7% % Reduction in Volume: 46.8% Epithelialization: Small (1-33%) Tunneling: No Undermining: No Wound Description Classification: Full Thickness Without Exposed Support Structures Exudate Amount: Medium Exudate Type: Serosanguineous Exudate Color: red, Mitchell Foul Odor After Cleansing: No Slough/Fibrino Yes Wound Bed Granulation Amount: Small (1-33%) Granulation Quality: Red, Pink Necrotic Amount: Large (67-100%) Necrotic Quality: Eschar, Adherent Slough Periwound Skin Texture Texture Color No Abnormalities Noted: Yes No Abnormalities Noted: No Rubor: Yes Moisture No Abnormalities Noted: No Temperature / Pain Dry / Scaly: No Temperature: No Abnormality Maceration: No Treatment Notes Wound #4 (Lower Leg) Wound Laterality: Right, Medial Cleanser Soap and Water Discharge Instruction: May shower and wash wound with dial antibacterial soap and water prior to dressing change. Peri-Wound Care Sween Lotion (Moisturizing lotion) Discharge Instruction: Apply moisturizing lotion as directed Topical Primary Dressing Sorbalgon AG Dressing, 4x4 (in/in) Discharge Instruction: Apply to wound bed as instructed Secondary Dressing Secured With Compression Wrap Kerlix Roll 4.5x3.1 (in/yd) Discharge Instruction: Apply Kerlix and Coban compression as directed. ANIECE, MARRIN ([id number]) [id number] Page Mitchell of 21 Coban Self-Adherent Wrap 4x5 (in/yd) Discharge Instruction: Apply over Kerlix as directed. Compression Stockings Add-Ons Electronic Signature(s) Signed: 06/27/2022 12:51:50 PM By: Worthy Rancher Signed: 06/27/2022 4:50:01 PM By: Cynthia Catholic RN Entered By: Worthy Rancher on 06/27/2022 11:12:57 -------------------------------------------------------------------------------- Wound Assessment Details Patient Name: Date of Service: [name] [name], [name]. 06/27/2022 11:00 A M Medical Record Number:  [id number] Patient Account Number: [id number] Date of Birth/Sex: Treating RN: [birth year]Mitchell (89 y.o. [name] Primary Care Kynzi Levay: Deland Pretty Other Clinician: Referring Dawana Asper: Treating Wafa Martes/Extender: [name] in Treatment: Mitchell Wound Status Wound Number: 5 Primary Etiology: Venous Leg Ulcer Wound Location: Left, Lateral Lower Leg Wound Status: Open Wounding Event: Blister Comorbid History: Hypertension, Dementia Date Acquired: 04/17/2022 Weeks Of Treatment: 9 Clustered Wound: No Photos Wound Measurements Length: (cm) 2 Width: (cm) 1.5 Depth: (cm) 0.1 Area: (cm) 2.356 Volume: (cm) 0.236 % Reduction in Area: 37.5% % Reduction in Volume: 37.4% Epithelialization: None Tunneling: No Undermining: No Wound Description Classification: Full Thickness Without Exposed Support Structures Exudate Amount: None Present Foul Odor After Cleansing: No Slough/Fibrino Yes Wound Bed Granulation Amount: Small (1-33%) Exposed Structure Granulation Quality: Red, Hyper-granulation Fascia Exposed: No Necrotic Amount: Large (67-100%) Fat Layer (Subcutaneous Tissue) Exposed: No Necrotic Quality: Eschar, Adherent Slough Tendon Exposed: No Muscle Exposed: No Joint Exposed: No Bone Exposed: No Periwound Skin Texture Texture Color No Abnormalities Noted: Yes No Abnormalities Noted: No Rubor: Yes Moisture No Abnormalities Noted: No Temperature / Pain Temperature: No Abnormality Hortman, Emeri Mitchell ([id number].pdf Page 17 of 21 Treatment Notes Wound #5 (Lower Leg) Wound Laterality: Left, Lateral Cleanser Soap and Water Discharge Instruction: May shower and wash wound with dial antibacterial soap and water prior to dressing change. Peri-Wound Care Sween Lotion (Moisturizing lotion) Discharge Instruction: Apply moisturizing lotion as directed Topical Primary Dressing Sorbalgon AG Dressing, 4x4 (in/in) Discharge  Instruction: Apply to wound bed as instructed Secondary Dressing Secured With Compression Wrap Kerlix Roll 4.5x3.1 (in/yd) Discharge  Instruction: Apply Kerlix and Coban compression as directed. Coban Self-Adherent Wrap 4x5 (in/yd) Discharge Instruction: Apply over Kerlix as directed. Compression Stockings Add-Ons Electronic Signature(s) Signed: 06/27/2022 12:51:50 PM By: Worthy Rancher Signed: 06/27/2022 4:50:01 PM By: Cynthia Catholic RN Entered By: Worthy Rancher on 06/27/2022 11:29:24 -------------------------------------------------------------------------------- Wound Assessment Details Patient Name: Date of Service: [name] [name], [name]. 06/27/2022 11:00 A M Medical Record Number: [id number] Patient Account Number: [id number] Date of Birth/Sex: Treating RN: [birth year]Mitchell (89 y.o. [name] Primary Care Alverna Fawley: Deland Pretty Other Clinician: Referring Ahmari Garton: Treating Webber Michiels/Extender: [name] in Treatment: Mitchell Wound Status Wound Number: 6 Primary Etiology: Venous Leg Ulcer Wound Location: Left, Posterior Lower Leg Wound Status: Open Wounding Event: Blister Comorbid History: Hypertension, Dementia Date Acquired: 04/17/2022 Weeks Of Treatment: 9 Clustered Wound: No Photos Wound Measurements Length: (cm) 7.8 Width: (cm) 3 Billick, Manju Mitchell ([id number]) Depth: (cm) 0.1 Area: (cm) 18.378 Volume: (cm) 1.838 % Reduction in Area: -2826.4% % Reduction in Volume: -2817.5% [id number] Page 18 of 21 Epithelialization: Small (1-33%) Tunneling: No Undermining: No Wound Description Classification: Full Thickness Without Exposed Support Structures Exudate Amount: None Present Foul Odor After Cleansing: No Slough/Fibrino Yes Wound Bed Granulation Amount: Small (1-33%) Exposed Structure Granulation Quality: Pink, Pale Fascia Exposed: No Necrotic Amount: Large (67-100%) Fat Layer (Subcutaneous Tissue) Exposed:  Yes Necrotic Quality: Eschar, Adherent Slough Tendon Exposed: No Muscle Exposed: No Joint Exposed: No Bone Exposed: No Periwound Skin Texture Texture Color No Abnormalities Noted: Yes No Abnormalities Noted: No Rubor: Yes Moisture No Abnormalities Noted: No Dry / Scaly: No Maceration: No Treatment Notes Wound #6 (Lower Leg) Wound Laterality: Left, Posterior Cleanser Soap and Water Discharge Instruction: May shower and wash wound with dial antibacterial soap and water prior to dressing change. Peri-Wound Care Sween Lotion (Moisturizing lotion) Discharge Instruction: Apply moisturizing lotion as directed Topical Primary Dressing Sorbalgon AG Dressing, 4x4 (in/in) Discharge Instruction: Apply to wound bed as instructed Secondary Dressing Secured With Compression Wrap Kerlix Roll 4.5x3.1 (in/yd) Discharge Instruction: Apply Kerlix and Coban compression as directed. Coban Self-Adherent Wrap 4x5 (in/yd) Discharge Instruction: Apply over Kerlix as directed. Compression Stockings Add-Ons Electronic Signature(s) Signed: 06/27/2022 12:51:50 PM By: Worthy Rancher Signed: 06/27/2022 4:50:01 PM By: Cynthia Catholic RN Entered By: Worthy Rancher on 06/27/2022 11:30:03 -------------------------------------------------------------------------------- Wound Assessment Details Patient Name: Date of Service: [name] [name], [name]. 06/27/2022 11:00 A M Medical Record Number: [id number] Patient Account Number: [id number] Date of Birth/Sex: Treating RN: [birth year]Mitchell (89 y.o. [name] Primary Care [place] Pam: Deland Pretty Other Clinician: Referring Steele Ledonne: Treating Jayln Madeira/Extender: [name] in Treatment: [address] [place] Mitchell ([id number]) [id number].pdf Page 19 of 21 Wound Status Wound Number: 7 Primary Etiology: Trauma, Other Wound Location: Left Wrist Wound Status: Healed - Epithelialized Wounding Event: Hematoma Comorbid History:  Hypertension, Dementia Date Acquired: 05/12/2022 Weeks Of Treatment: 6 Clustered Wound: No Wound Measurements Length: (cm) Width: (cm) Depth: (cm) Area: (cm) Volume: (cm) 0 % Reduction in Area: 100% 0 % Reduction in Volume: 100% 0 Epithelialization: None 0 Tunneling: No 0 Undermining: No Wound Description Classification: Full Thickness Without Exposed Support Exudate Amount: None Present Structures Foul Odor After Cleansing: No Slough/Fibrino No Wound Bed Granulation Amount: None Present (0%) Exposed Structure Necrotic Amount: None Present (0%) Fascia Exposed: No Fat Layer (Subcutaneous Tissue) Exposed: No Tendon Exposed: No Muscle Exposed: No Joint Exposed: No Bone Exposed: No Periwound Skin Texture Texture Color No Abnormalities Noted: Yes No Abnormalities Noted: Yes Moisture Temperature / Pain No Abnormalities Noted: Yes  Temperature: No Abnormality Electronic Signature(s) Signed: 06/27/2022 4:50:01 PM By: Cynthia Catholic RN Entered By: [name] on 06/27/2022 11:30:41 -------------------------------------------------------------------------------- Wound Assessment Details Patient Name: Date of Service: [name] RDSCathlean Mitchell 06/27/2022 11:00 A M Medical Record Number: [id number] Patient Account Number: [id number] Date of Birth/Sex: Treating RN: [birth year] Mitchell (89 y.o. [name] Primary Care [place] Mer[place]: Deland Pretty Other Clinician: Referring Jillane Po: Treating Kelliann Pendergraph/Extender: [name] in Treatment: Mitchell Wound Status Wound Number: 8 Primary Etiology: Trauma, Other Wound Location: Right, Lateral Knee Wound Status: Open Wounding Event: Skin Tear/Laceration Comorbid History: Hypertension, Dementia Date Acquired: 05/12/2022 Weeks Of Treatment: 6 Clustered Wound: No Photos MERLENE, KELSH ([id number]) [id number].pdf Page 20 of 21 Wound Measurements Length: (cm) 1.6 Width: (cm) 0.6 Depth: (cm)  0.1 Area: (cm) 0.754 Volume: (cm) 0.075 % Reduction in Area: -113.6% % Reduction in Volume: -114.3% Epithelialization: Small (1-33%) Tunneling: No Undermining: No Wound Description Classification: Full Thickness Without Exposed Support Structures Exudate Amount: Medium Exudate Type: Serosanguineous Exudate Color: red, Mitchell Foul Odor After Cleansing: No Slough/Fibrino Yes Wound Bed Granulation Amount: Small (1-33%) Exposed Structure Granulation Quality: Pink Fascia Exposed: No Necrotic Amount: Large (67-100%) Fat Layer (Subcutaneous Tissue) Exposed: Yes Necrotic Quality: Eschar, Adherent Slough Tendon Exposed: No Muscle Exposed: No Joint Exposed: No Bone Exposed: No Periwound Skin Texture Texture Color No Abnormalities Noted: Yes No Abnormalities Noted: Yes Moisture Temperature / Pain No Abnormalities Noted: Yes Temperature: No Abnormality Treatment Notes Wound #8 (Knee) Wound Laterality: Right, Lateral Cleanser Soap and Water Discharge Instruction: May shower and wash wound with dial antibacterial soap and water prior to dressing change. Peri-Wound Care Sween Lotion (Moisturizing lotion) Discharge Instruction: Apply moisturizing lotion as directed Topical Primary Dressing Sorbalgon AG Dressing, 4x4 (in/in) Discharge Instruction: Apply to wound bed as instructed Secondary Dressing Secured With Compression Wrap Kerlix Roll 4.5x3.1 (in/yd) Discharge Instruction: Apply Kerlix and Coban compression as directed. Coban Self-Adherent Wrap 4x5 (in/yd) Discharge Instruction: Apply over Kerlix as directed. Compression Stockings Add-Ons Electronic Signature(s) Signed: 06/27/2022 12:51:50 PM By: Worthy Rancher Signed: 06/27/2022 4:50:01 PM By: Cynthia Catholic RN Entered By: Worthy Rancher on 06/27/2022 11:21:12 -------------------------------------------------------------------------------- Wound Assessment Details Patient Name: Date of Service: [name] [name], [name].  06/27/2022 11:00 A M Medical Record Number: [id number] Patient Account Number: [id number] Date of Birth/Sex: Treating RN: [birth year] (89 y[address]o. [address] [place], [place] Mitchell ([id number]) [phone].pdf Page 21 of 21 Primary Care Katisha Shimizu: Deland Pretty Other Clinician: Referring Terryl Molinelli: Treating Daryon Remmert/Extender: [name] in Treatment: Mitchell Wound Status Wound Number: 9 Primary Etiology: Trauma, Other Wound Location: Left, Posterior Knee Wound Status: Healed - Epithelialized Wounding Event: Skin Tear/Laceration Comorbid History: Hypertension, Dementia Date Acquired: 05/11/2022 Weeks Of Treatment: 6 Clustered Wound: No Wound Measurements Length: (cm) Width: (cm) Depth: (cm) Area: (cm) Volume: (cm) 0 % Reduction in Area: 100% 0 % Reduction in Volume: 100% 0 Epithelialization: Large (67-100%) 0 Tunneling: No 0 Undermining: No Wound Description Classification: Full Thickness Without Exposed Support Structures Exudate Amount: None Present Foul Odor After Cleansing: No Slough/Fibrino No Wound Bed Granulation Amount: None Present (0%) Exposed Structure Necrotic Amount: None Present (0%) Fascia Exposed: No Fat Layer (Subcutaneous Tissue) Exposed: No Tendon Exposed: No Muscle Exposed: No Joint Exposed: No Bone Exposed: No Periwound Skin Texture Texture Color No Abnormalities Noted: Yes No Abnormalities Noted: Yes Moisture Temperature / Pain No Abnormalities Noted: Yes Temperature: No Abnormality Electronic Signature(s) Signed: 06/27/2022 4:50:01 PM By: Cynthia Catholic RN Entered By: [name] on 06/27/2022 11:31:17 -------------------------------------------------------------------------------- Vitals Details  Patient Name: Date of Service: [name] [name] 06/27/2022 11:00 A M Medical Record Number: [id number] Patient Account Number: [id number] Date of Birth/Sex: Treating RN: [birth year] (89 y.o. [name] Primary Care Maxwell Lemen: Deland Pretty Other Clinician: Referring Corrin Sieling: Treating Dominiq Fontaine/Extender: [name] in Treatment: Mitchell Vital Signs Time Taken: 10:59 Temperature (F): 98.3 Height (in): 66 Pulse (bpm): 93 Weight (lbs): 133 Respiratory Rate (breaths/min): Mitchell Body Mass Index (BMI): 21.5 Blood Pressure (mmHg): 120/80 Reference Range: 80 - 120 mg / dl Electronic Signature(s) Signed: 06/27/2022 4:50:01 PM By: Cynthia Catholic RN Entered By: [name] on 06/27/2022 10:59:53

## 2022-06-29 DIAGNOSIS — Z515 Encounter for palliative care: Secondary | ICD-10-CM | POA: Diagnosis not present

## 2022-06-29 DIAGNOSIS — M19049 Primary osteoarthritis, unspecified hand: Secondary | ICD-10-CM | POA: Diagnosis not present

## 2022-06-29 DIAGNOSIS — N184 Chronic kidney disease, stage 4 (severe): Secondary | ICD-10-CM | POA: Diagnosis not present

## 2022-07-10 DIAGNOSIS — L97829 Non-pressure chronic ulcer of other part of left lower leg with unspecified severity: Secondary | ICD-10-CM | POA: Diagnosis not present

## 2022-07-10 DIAGNOSIS — I83023 Varicose veins of left lower extremity with ulcer of ankle: Secondary | ICD-10-CM | POA: Diagnosis not present

## 2022-07-10 DIAGNOSIS — F0390 Unspecified dementia without behavioral disturbance: Secondary | ICD-10-CM | POA: Diagnosis not present

## 2022-07-10 DIAGNOSIS — Z940 Kidney transplant status: Secondary | ICD-10-CM | POA: Diagnosis not present

## 2022-07-10 DIAGNOSIS — L97919 Non-pressure chronic ulcer of unspecified part of right lower leg with unspecified severity: Secondary | ICD-10-CM | POA: Diagnosis not present

## 2022-07-10 DIAGNOSIS — I83018 Varicose veins of right lower extremity with ulcer other part of lower leg: Secondary | ICD-10-CM | POA: Diagnosis not present

## 2022-07-10 DIAGNOSIS — R600 Localized edema: Secondary | ICD-10-CM | POA: Diagnosis not present

## 2022-07-10 DIAGNOSIS — I83028 Varicose veins of left lower extremity with ulcer other part of lower leg: Secondary | ICD-10-CM | POA: Diagnosis not present

## 2022-07-17 DIAGNOSIS — F0390 Unspecified dementia without behavioral disturbance: Secondary | ICD-10-CM | POA: Diagnosis not present

## 2022-07-17 DIAGNOSIS — I83891 Varicose veins of right lower extremities with other complications: Secondary | ICD-10-CM | POA: Diagnosis not present

## 2022-07-17 DIAGNOSIS — N1832 Chronic kidney disease, stage 3b: Secondary | ICD-10-CM | POA: Diagnosis not present

## 2022-07-18 ENCOUNTER — Encounter (HOSPITAL_BASED_OUTPATIENT_CLINIC_OR_DEPARTMENT_OTHER): Payer: Medicare PPO | Attending: General Surgery | Admitting: General Surgery

## 2022-07-18 DIAGNOSIS — L97822 Non-pressure chronic ulcer of other part of left lower leg with fat layer exposed: Secondary | ICD-10-CM | POA: Diagnosis not present

## 2022-07-18 DIAGNOSIS — X58XXXA Exposure to other specified factors, initial encounter: Secondary | ICD-10-CM | POA: Insufficient documentation

## 2022-07-18 DIAGNOSIS — Z86718 Personal history of other venous thrombosis and embolism: Secondary | ICD-10-CM | POA: Diagnosis not present

## 2022-07-18 DIAGNOSIS — S41119A Laceration without foreign body of unspecified upper arm, initial encounter: Secondary | ICD-10-CM | POA: Insufficient documentation

## 2022-07-18 DIAGNOSIS — Z515 Encounter for palliative care: Secondary | ICD-10-CM | POA: Diagnosis not present

## 2022-07-18 DIAGNOSIS — F0284 Dementia in other diseases classified elsewhere, unspecified severity, with anxiety: Secondary | ICD-10-CM | POA: Insufficient documentation

## 2022-07-18 DIAGNOSIS — S81819A Laceration without foreign body, unspecified lower leg, initial encounter: Secondary | ICD-10-CM | POA: Diagnosis not present

## 2022-07-18 DIAGNOSIS — L97512 Non-pressure chronic ulcer of other part of right foot with fat layer exposed: Secondary | ICD-10-CM | POA: Diagnosis not present

## 2022-07-18 DIAGNOSIS — L97812 Non-pressure chronic ulcer of other part of right lower leg with fat layer exposed: Secondary | ICD-10-CM | POA: Insufficient documentation

## 2022-07-18 DIAGNOSIS — L03115 Cellulitis of right lower limb: Secondary | ICD-10-CM | POA: Diagnosis not present

## 2022-07-18 DIAGNOSIS — I872 Venous insufficiency (chronic) (peripheral): Secondary | ICD-10-CM | POA: Diagnosis not present

## 2022-07-18 NOTE — Progress Notes (Signed)
Cynthia, Mitchell ([id number] Page 1 of 8 Visit Report for 07/18/2022 Chief Complaint Document Details Patient Name: Date of Service: Cynthia [name] 07/18/2022 10:00 A M Medical Record Number: [id number] Patient Account Number: [id number] Date of Birth/Sex: Treating RN: 07-Jan-1932 (89 y.o. F) Primary Care Provider: Deland Pretty Other Clinician: Referring Provider: Treating Provider/Extender: Raylene Everts in Treatment: 19 Information Obtained from: Patient Chief Complaint Patient seen for complaints of Non-Healing Wounds. Electronic Signature(s) Signed: 07/18/2022 11:03:58 AM By: Fredirick Maudlin MD FACS Entered By: Fredirick Maudlin on 07/18/2022 11:03:58 -------------------------------------------------------------------------------- HPI Details Patient Name: Date of Service: [name], [name]. 07/18/2022 10:00 A M Medical Record Number: [id number] Patient Account Number: [id number] Date of Birth/Sex: Treating RN: 02-16-1932 (89 y.o. F) Primary Care Provider: Deland Pretty Other Clinician: Referring Provider: Treating Provider/Extender: Raylene Everts in Treatment: 35 History of Present Illness HPI Description: ADMISSION 03/01/2022 This is an 89 year old woman with dementia, residing in a memory care facility. She has a past medical history significant for renal failure, status post kidney transplant and now off dialysis. She is on chronic immunosuppression with tacrolimus and prednisone. She was recently admitted with an extensive DVT of the left lower extremity, identified when her nursing facility staff noticed extreme swelling of her left lower leg. She was initiated on Eliquis. Vascular surgery saw her and recommended compression stockings. Unfortunately, her skin is so thin that the stockings, combined with the leg swelling, resulted in multiple wounds opening up on her left lower leg. ABI in  clinic today was only 0.61. There are multiple lesions open on her leg, including a cluster of 3 just above the lateral ankle, 1 on her posterior calf, and another on the medial lower leg. They all have thick slough and eschar present. The skin of her leg is discolored and blue. 03/07/2022: All of the wounds are cleaner today. They have slough present but the eschar has softened. Edema control is good. 12/15; 2 of her 3 wounds are closed the only 1 remaining on the left lower leg is the 1 on the posterior calf and this looks healthy and measuring smaller. We have been using silver alginate with kerlix and Coban. Patient lives in the dementia unit at [facility] facility which is a life care facility. 04/25/2022: Since the last visit in our clinic, the patient has suffered fairly significant decline in her cognitive function. She has been made chiefly comfort care measures and DNR. She has been moved from the memory care unit to the nursing area of her living facility. She also developed an acute DVT fairly , extensive from the report, in her right leg. Her TED hose were removed out of concern for potentially breaking the clot off and causing it to mobilize. Her Eliquis was resumed and as a result, she has developed multiple small hematomas on her leg, 1 of which has ruptured. She was referred back to the wound care center to have that site managed. The paperwork from her facility describes the area as necrotic, but what I see on exam is simply clotted blood. They have been applying Xeroform and Kerlix to her legs. She has 2+ pitting edema bilaterally. 05/16/2022: In the interval since our last visit, she has sustained multiple new wounds that appear secondary to falls. She has skin tears on her wrist, right lateral knee, left posterior knee, and left medial leg that are new. The large wound that I saw her for on January 9  has cleaned up quite nicely and is now rather superficial. Her edema is very  poorly controlled. 05/23/2022: She has yet another new wound on her right lower leg just lateral to and below her knee this has been Steri-Stripped. Her right fourth toe is bright red, tender, and swollen, but there is no obvious wound. All of her other wounds look about the same. There is some slough accumulation on the surfaces. 06/06/2022: She has more new skin tears and some of the old ones have slough accumulation on their surface. 06/27/2022: The skin tear on her wrist has healed. She has a large hematoma on her right anterior leg, just lateral to the tibial surface. She has slough accumulation on all of her existing wounds. 07/18/2022: She has new skin tears on her arms and legs. Some of the old skin tears have healed. The old hematoma sites have some slough accumulation. She is particularly distressed today; this seems to get worse with each visit. Electronic Signature(s) Signed: 07/18/2022 11:04:47 AM By: Fredirick Maudlin MD [name], [name] ([id number] Page 2 of 8 Entered By: Fredirick Maudlin on 07/18/2022 11:04:47 -------------------------------------------------------------------------------- Physical Exam Details Patient Name: Date of Service: Cynthia [name] 07/18/2022 10:00 A M Medical Record Number: [id number] Patient Account Number: [id number] Date of Birth/Sex: Treating RN: 1931-09-04 (89 y.o. F) Primary Care Provider: Deland Pretty Other Clinician: Referring Provider: Treating Provider/Extender: Raylene Everts in Treatment: 19 Constitutional . . . . Confused and agitated. Notes 07/18/2022: She has new skin tears on her arms and legs. Some of the old skin tears have healed. The old hematoma sites have some slough accumulation. Electronic Signature(s) Signed: 07/18/2022 11:44:15 AM By: Fredirick Maudlin MD FACS Entered By: Fredirick Maudlin on 07/18/2022  11:44:14 -------------------------------------------------------------------------------- Physician Orders Details Patient Name: Date of Service: [name], Cynthia Cynthia Cera. 07/18/2022 10:00 A M Medical Record Number: [id number] Patient Account Number: [id number] Date of Birth/Sex: Treating RN: 1931/11/06 (89 y.o. [name] Primary Care Provider: Deland Pretty Other Clinician: Referring Provider: Treating Provider/Extender: Raylene Everts in Treatment: 110 Verbal / Phone Orders: No Diagnosis Coding ICD-10 Coding Code Description [phone] Non-pressure chronic ulcer of other part of right lower leg with fat layer exposed L97.822 Non-pressure chronic ulcer of other part of left lower leg with fat layer exposed I82.492 Acute embolism and thrombosis of other specified deep vein of left lower extremity Dementia in other diseases classified elsewhere, unspecified severity, without behavioral disturbance, psychotic disturbance, mood F02.80 disturbance, and anxiety I82.491 Acute embolism and thrombosis of other specified deep vein of right lower extremity Discharge From [facility] Discharge from [place] - Patient increasingly distressed by coming to the wound care center; Dr. Celine Ahr is concerned that continued visits do not fall into the realm of comfort care and compassionate care for her. Unless absolutely necessary, she does not think further visits are in her overall best interest. Facility to continue wound care. Anesthetic (In clinic) Topical Lidocaine 4% applied to wound bed Bathing/ Shower/ Hygiene May shower and wash wound with soap and water. Edema Control - Lymphedema / SCD / Other Elevate legs to the level of the heart or above for 30 minutes daily and/or when sitting for 3-4 times a day throughout the day. Avoid standing for long periods of time. Other Edema Control Orders/Instructions: - Elevate/Rest legs throughout the day Patient  Medications llergies: cephalexin, Keflex, minocycline A Notifications Medication Indication Start End 07/18/2022 lidocaine DOSE topical 4 % cream -  cream topical [name], [name] ([id number]) [phone].pdf Page 3 of 8 Electronic Signature(s) Signed: 07/18/2022 11:44:37 AM By: Fredirick Maudlin MD FACS Entered By: Fredirick Maudlin on 07/18/2022 11:44:36 -------------------------------------------------------------------------------- Problem List Details Patient Name: Date of Service: [name], Cynthia [place]. 07/18/2022 10:00 A M Medical Record Number: [id number] Patient Account Number: [id number] Date of Birth/Sex: Treating RN: 11-22-1931 (89 y.o. F) Primary Care Provider: Deland Pretty Other Clinician: Referring Provider: Treating Provider/Extender: Raylene Everts in Treatment: 19 Active Problems ICD-10 Encounter Code Description Active Date MDM Diagnosis L97.812 Non-pressure chronic ulcer of other part of right lower leg with fat layer 04/25/2022 No Yes exposed L97.822 Non-pressure chronic ulcer of other part of left lower leg with fat layer 05/16/2022 No Yes exposed I82.492 Acute embolism and thrombosis of other specified deep vein of left lower 03/01/2022 No Yes extremity F02.80 Dementia in other diseases classified elsewhere, unspecified severity, without 03/01/2022 No Yes behavioral disturbance, psychotic disturbance, mood disturbance, and anxiety I82.491 Acute embolism and thrombosis of other specified deep vein of right lower 04/25/2022 No Yes extremity Inactive Problems ICD-10 Code Description Active Date Inactive Date L97.822 Non-pressure chronic ulcer of other part of left lower leg with fat layer exposed 03/01/2022 03/01/2022 Resolved Problems ICD-10 Code Description Active Date Resolved Date L98.492 Non-pressure chronic ulcer of skin of other sites with fat layer exposed 05/16/2022 05/16/2022 Electronic Signature(s) Signed:  07/18/2022 11:03:46 AM By: Fredirick Maudlin MD FACS Entered By: Fredirick Maudlin on 07/18/2022 11:03:45 -------------------------------------------------------------------------------- Progress Note Details Patient Name: Date of Service: [name], Cynthia [place]se[place]. 07/18/2022 10:00 A M Medical Record Number: [id number] Patient Account Number: [id number] [name], [name] ([id number]) [phone].pdf Page 4 of 8 Date of Birth/Sex: Treating RN: 1932-04-17 (89 y.o. F) Primary Care Provider: Deland Pretty Other Clinician: Referring Provider: Treating Provider/Extender: Raylene Everts in Treatment: 19 Subjective Chief Complaint Information obtained from Patient Patient seen for complaints of Non-Healing Wounds. History of Present Illness (HPI) ADMISSION 03/01/2022 This is an 89 year old woman with dementia, residing in a memory care facility. She has a past medical history significant for renal failure, status post kidney transplant and now off dialysis. She is on chronic immunosuppression with tacrolimus and prednisone. She was recently admitted with an extensive DVT of the left lower extremity, identified when her nursing facility staff noticed extreme swelling of her left lower leg. She was initiated on Eliquis. Vascular surgery saw her and recommended compression stockings. Unfortunately, her skin is so thin that the stockings, combined with the leg swelling, resulted in multiple wounds opening up on her left lower leg. ABI in clinic today was only 0.61. There are multiple lesions open on her leg, including a cluster of 3 just above the lateral ankle, 1 on her posterior calf, and another on the medial lower leg. They all have thick slough and eschar present. The skin of her leg is discolored and blue. 03/07/2022: All of the wounds are cleaner today. They have slough present but the eschar has softened. Edema control is good. 12/15; 2 of her 3 wounds are  closed the only 1 remaining on the left lower leg is the 1 on the posterior calf and this looks healthy and measuring smaller. We have been using silver alginate with kerlix and Coban. Patient lives in the dementia unit at [facility] facility which is a life care facility. 04/25/2022: Since the last visit in our clinic, the patient has suffered fairly significant decline in her cognitive function. She has been made chiefly comfort  care measures and DNR. She has been moved from the memory care unit to the nursing area of her living facility. She also developed an acute DVT fairly , extensive from the report, in her right leg. Her TED hose were removed out of concern for potentially breaking the clot off and causing it to mobilize. Her Eliquis was resumed and as a result, she has developed multiple small hematomas on her leg, 1 of which has ruptured. She was referred back to the wound care center to have that site managed. The paperwork from her facility describes the area as necrotic, but what I see on exam is simply clotted blood. They have been applying Xeroform and Kerlix to her legs. She has 2+ pitting edema bilaterally. 05/16/2022: In the interval since our last visit, she has sustained multiple new wounds that appear secondary to falls. She has skin tears on her wrist, right lateral knee, left posterior knee, and left medial leg that are new. The large wound that I saw her for on January 9 has cleaned up quite nicely and is now rather superficial. Her edema is very poorly controlled. 05/23/2022: She has yet another new wound on her right lower leg just lateral to and below her knee this has been Steri-Stripped. Her right fourth toe is bright red, tender, and swollen, but there is no obvious wound. All of her other wounds look about the same. There is some slough accumulation on the surfaces. 06/06/2022: She has more new skin tears and some of the old ones have slough accumulation on their  surface. 06/27/2022: The skin tear on her wrist has healed. She has a large hematoma on her right anterior leg, just lateral to the tibial surface. She has slough accumulation on all of her existing wounds. 07/18/2022: She has new skin tears on her arms and legs. Some of the old skin tears have healed. The old hematoma sites have some slough accumulation. She is particularly distressed today; this seems to get worse with each visit. Patient History Information obtained from Patient. Family History Unknown History. Social History Never smoker, Marital Status - Married, Alcohol Use - Never, Drug Use - No History, Caffeine Use - Rarely. Medical History Eyes Denies history of Cataracts, Glaucoma, Optic Neuritis Ear/Nose/Mouth/Throat Denies history of Chronic sinus problems/congestion, Middle ear problems Hematologic/Lymphatic Denies history of Anemia, Hemophilia, Human Immunodeficiency Virus, Lymphedema, Sickle Cell Disease Respiratory Denies history of Aspiration, Asthma, Chronic Obstructive Pulmonary Disease (COPD), Pneumothorax, Sleep Apnea, Tuberculosis Cardiovascular Patient has history of Hypertension Gastrointestinal Denies history of Cirrhosis , Colitis, Crohnoos, Hepatitis A, Hepatitis B Endocrine Denies history of Type I Diabetes, Type II Diabetes Genitourinary Denies history of End Stage Renal Disease Integumentary (Skin) Denies history of History of Burn Musculoskeletal Denies history of Gout, Rheumatoid Arthritis, Osteoarthritis, Osteomyelitis Neurologic Patient has history of Dementia Denies history of Neuropathy, Paraplegia, Seizure Disorder Psychiatric Denies history of Anorexia/bulimia, Confinement Anxiety Hospitalization/Surgery History - Orif patella right- 2015. - 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. - Breast surgery- unknown. [name], [name] ([id number] Page 5 of 8 Medical A Surgical History  Notes nd Genitourinary polyneuropathy Objective Constitutional Confused and agitated. Vitals Time Taken: 10:01 AM, Height: 66 in, Weight: 133 lbs, BMI: 21.5, Temperature: 98.2 F, Pulse: 95 bpm, Respiratory Rate: 20 breaths/min, Blood Pressure: 124/87 mmHg. General Notes: 07/18/2022: She has new skin tears on her arms and legs. Some of the old skin tears have healed. The old hematoma sites have some slough accumulation. Integumentary (Hair, Skin) Wound #10 status is  Open. Original cause of wound was Blister. The date acquired was: 05/12/2022. The wound has been in treatment 9 weeks. The wound is located on the Left T Third. The wound measures 0.7cm length x 0.8cm width x 0.1cm depth; 0.44cm^2 area and 0.044cm^3 volume. There is Fat Layer oe (Subcutaneous Tissue) exposed. There is no tunneling or undermining noted. There is a medium amount of serosanguineous drainage noted. The wound margin is distinct with the outline attached to the wound base. There is small (1-33%) red granulation within the wound bed. There is a large (67-100%) amount of necrotic tissue within the wound bed including Adherent Slough. The periwound skin appearance had no abnormalities noted for texture. The periwound skin appearance had no abnormalities noted for moisture. The periwound skin appearance exhibited: Rubor. Periwound temperature was noted as No Abnormality. The periwound has tenderness on palpation. Wound #11 status is Open. Original cause of wound was Trauma. The date acquired was: 06/22/2022. The wound has been in treatment 3 weeks. The wound is located on the Right,Proximal,Anterior Lower Leg. The wound measures 5.5cm length x 3cm width x 0.1cm depth; 12.959cm^2 area and 1.296cm^3 volume. There is Fat Layer (Subcutaneous Tissue) exposed. There is no tunneling or undermining noted. There is a medium amount of serosanguineous drainage noted. The wound margin is distinct with the outline attached to the wound base.  There is medium (34-66%) red granulation within the wound bed. There is a medium (34-66%) amount of necrotic tissue within the wound bed including Adherent Slough. The periwound skin appearance had no abnormalities noted for texture. The periwound skin appearance had no abnormalities noted for moisture. The periwound skin appearance exhibited: Ecchymosis. Periwound temperature was noted as No Abnormality. The periwound has tenderness on palpation. Wound #12 status is Open. Original cause of wound was Trauma. The date acquired was: 06/22/2022. The wound has been in treatment 3 weeks. The wound is located on the Right,Lateral Lower Leg. The wound measures 13cm length x 6.8cm width x 0.1cm depth; 69.429cm^2 area and 6.943cm^3 volume. There is Fat Layer (Subcutaneous Tissue) exposed. There is no tunneling or undermining noted. There is a medium amount of serosanguineous drainage noted. The wound margin is distinct with the outline attached to the wound base. There is small (1-33%) pink, pale granulation within the wound bed. There is a large (67-100%) amount of necrotic tissue within the wound bed including Adherent Slough. The periwound skin appearance had no abnormalities noted for texture. The periwound skin appearance had no abnormalities noted for moisture. The periwound skin appearance exhibited: Ecchymosis, Hemosiderin Staining. Periwound temperature was noted as No Abnormality. The periwound has tenderness on palpation. Wound #14 status is Open. Original cause of wound was Trauma. The date acquired was: 06/22/2022. The wound has been in treatment 3 weeks. The wound is located on the Left,Anterior Lower Leg. The wound measures 0.9cm length x 0.7cm width x 0.1cm depth; 0.495cm^2 area and 0.049cm^3 volume. There is Fat Layer (Subcutaneous Tissue) exposed. There is no tunneling or undermining noted. There is a medium amount of serosanguineous drainage noted. The wound margin is distinct with the outline  attached to the wound base. There is large (67-100%) pink, pale granulation within the wound bed. There is a small (1-33%) amount of necrotic tissue within the wound bed including Eschar and Adherent Slough. The periwound skin appearance had no abnormalities noted for texture. The periwound skin appearance had no abnormalities noted for moisture. The periwound skin appearance exhibited: Ecchymosis, Hemosiderin Staining. Periwound temperature was noted as No  Abnormality. The periwound has tenderness on palpation. Wound #15 status is Open. Original cause of wound was Skin T ear/Laceration. The date acquired was: 07/18/2022. The wound is located on the Left Upper Leg. The wound measures 3cm length x 1.2cm width x 0.1cm depth; 2.827cm^2 area and 0.283cm^3 volume. There is Fat Layer (Subcutaneous Tissue) exposed. There is no tunneling or undermining noted. There is a medium amount of serosanguineous drainage noted. The wound margin is flat and intact. There is small (1-33%) red granulation within the wound bed. There is a large (67-100%) amount of necrotic tissue within the wound bed including Adherent Slough. The periwound skin appearance had no abnormalities noted for texture. The periwound skin appearance had no abnormalities noted for moisture. The periwound skin appearance exhibited: Ecchymosis. Periwound temperature was noted as No Abnormality. The periwound has tenderness on palpation. Wound #16 status is Open. Original cause of wound was Skin T ear/Laceration. The date acquired was: 07/18/2022. The wound is located on the Left Upper Arm. The wound measures 14.5cm length x 8.5cm width x 0.1cm depth; 96.8cm^2 area and 9.68cm^3 volume. There is Fat Layer (Subcutaneous Tissue) exposed. There is no tunneling or undermining noted. There is a medium amount of serosanguineous drainage noted. The wound margin is flat and intact. There is large (67-100%) red granulation within the wound bed. There is no necrotic  tissue within the wound bed. The periwound skin appearance had no abnormalities noted for texture. The periwound skin appearance had no abnormalities noted for moisture. The periwound skin appearance exhibited: Ecchymosis. Periwound temperature was noted as No Abnormality. The periwound has tenderness on palpation. Wound #17 status is Open. Original cause of wound was Skin T ear/Laceration. The date acquired was: 07/18/2022. The wound is located on the Left,Lateral Knee. The wound measures 4cm length x 2cm width x 0.1cm depth; 6.283cm^2 area and 0.628cm^3 volume. There is Fat Layer (Subcutaneous Tissue) exposed. There is no tunneling or undermining noted. There is a medium amount of serosanguineous drainage noted. The wound margin is distinct with the outline attached to the wound base. There is medium (34-66%) red, pink granulation within the wound bed. There is a medium (34-66%) amount of necrotic tissue within the wound bed including Adherent Slough. The periwound skin appearance had no abnormalities noted for texture. The periwound skin appearance exhibited: Ecchymosis. Periwound temperature was noted as No Abnormality. The periwound has tenderness on palpation. Wound #18 status is Open. Original cause of wound was Gradually Appeared. The date acquired was: 07/18/2022. The wound is located on the Right,Dorsal Foot. The wound measures 5.9cm length x 6.9cm width x 0.1cm depth; 31.974cm^2 area and 3.197cm^3 volume. There is Fat Layer (Subcutaneous Tissue) exposed. There is no tunneling or undermining noted. There is a medium amount of serosanguineous drainage noted. The wound margin is distinct with the outline attached to the wound base. There is small (1-33%) red granulation within the wound bed. There is a large (67-100%) amount of necrotic tissue within the wound bed including Eschar and Adherent Slough. The periwound skin appearance had no abnormalities noted for texture. The periwound skin appearance  had no abnormalities noted for moisture. The periwound skin appearance exhibited: Ecchymosis. Periwound temperature was noted as No Abnormality. The periwound has tenderness on palpation. Wound #2 status is Healed - Epithelialized. Original cause of wound was Skin Tear/Laceration. The date acquired was: 02/04/2022. The wound has been in treatment 19 weeks. The wound is located on the Left,Medial Lower Leg. The wound measures 0cm length x 0cm width  x 0cm depth; 0cm^2 area and [name], [name] ([id number]) [id number].pdf Page 6 of 8 volume. There is no tunneling or undermining noted. There is a none present amount of drainage noted. The wound margin is flat and intact. There is no granulation within the wound bed. There is no necrotic tissue within the wound bed. The periwound skin appearance had no abnormalities noted for texture. The periwound skin appearance had no abnormalities noted for moisture. The periwound skin appearance had no abnormalities noted for color. Periwound temperature was noted as No Abnormality. Wound #4 status is Open. Original cause of wound was Blister. The date acquired was: 04/18/2022. The wound has been in treatment 12 weeks. The wound is located on the Right,Medial Lower Leg. The wound measures 2cm length x 1.8cm width x 0.2cm depth; 2.827cm^2 area and 0.565cm^3 volume. There is muscle and Fat Layer (Subcutaneous Tissue) exposed. There is no tunneling or undermining noted. There is a medium amount of serosanguineous drainage noted. The wound margin is distinct with the outline attached to the wound base. There is small (1-33%) red, pink granulation within the wound bed. There is a large (67- 100%) amount of necrotic tissue within the wound bed including Adherent Slough. The periwound skin appearance had no abnormalities noted for texture. The periwound skin appearance exhibited: Ecchymosis, Hemosiderin Staining, Rubor. The periwound skin  appearance did not exhibit: Dry/Scaly, Maceration. Periwound temperature was noted as No Abnormality. The periwound has tenderness on palpation. Wound #5 status is Open. Original cause of wound was Blister. The date acquired was: 04/17/2022. The wound has been in treatment 12 weeks. The wound is located on the Left,Lateral Lower Leg. The wound measures 21.5cm length x 6cm width x 0.1cm depth; 101.316cm^2 area and 10.132cm^3 volume. There is Fat Layer (Subcutaneous Tissue) exposed. There is no tunneling or undermining noted. There is a medium amount of serosanguineous drainage noted. The wound margin is distinct with the outline attached to the wound base. There is small (1-33%) red, hyper - granulation within the wound bed. There is a large (67-100%) amount of necrotic tissue within the wound bed including Adherent Slough. The periwound skin appearance had no abnormalities noted for texture. The periwound skin appearance had no abnormalities noted for moisture. The periwound skin appearance exhibited: Ecchymosis, Hemosiderin Staining, Rubor. Periwound temperature was noted as No Abnormality. The periwound has tenderness on palpation. Wound #6 status is Open. Original cause of wound was Blister. The date acquired was: 04/17/2022. The wound has been in treatment 12 weeks. The wound is located on the Left,Posterior Lower Leg. The wound measures 1cm length x 1.3cm width x 0.1cm depth; 1.021cm^2 area and 0.102cm^3 volume. There is Fat Layer (Subcutaneous Tissue) exposed. There is no tunneling or undermining noted. There is a medium amount of serosanguineous drainage noted. The wound margin is distinct with the outline attached to the wound base. There is large (67-100%) red, pink granulation within the wound bed. There is no necrotic tissue within the wound bed. The periwound skin appearance had no abnormalities noted for texture. The periwound skin appearance exhibited: Ecchymosis, Rubor. The periwound skin  appearance did not exhibit: Dry/Scaly, Maceration. Periwound temperature was noted as No Abnormality. The periwound has tenderness on palpation. Wound #8 status is Healed - Epithelialized. Original cause of wound was Skin Tear/Laceration. The date acquired was: 05/12/2022. The wound has been in treatment 9 weeks. The wound is located on the Right,Lateral Knee. The wound measures 0cm length x 0cm width x 0cm depth; 0cm^2 area and  0cm^3 volume. There is no tunneling or undermining noted. There is a none present amount of drainage noted. The wound margin is flat and intact. There is no granulation within the wound bed. There is no necrotic tissue within the wound bed. The periwound skin appearance had no abnormalities noted for texture. The periwound skin appearance had no abnormalities noted for moisture. The periwound skin appearance had no abnormalities noted for color. Periwound temperature was noted as No Abnormality. Assessment Active Problems ICD-10 Non-pressure chronic ulcer of other part of right lower leg with fat layer exposed Non-pressure chronic ulcer of other part of left lower leg with fat layer exposed Acute embolism and thrombosis of other specified deep vein of left lower extremity Dementia in other diseases classified elsewhere, unspecified severity, without behavioral disturbance, psychotic disturbance, mood disturbance, and anxiety Acute embolism and thrombosis of other specified deep vein of right lower extremity Plan Discharge From [facility] Services: Discharge from [place] - Patient increasingly distressed by coming to the wound care center; Dr. Celine Ahr is concerned that continued visits do not fall into the realm of comfort care and compassionate care for her. Unless absolutely necessary, she does not think further visits are in her overall best interest. Facility to continue wound care. Anesthetic: (In clinic) Topical Lidocaine 4% applied to wound bed Bathing/ Shower/  Hygiene: May shower and wash wound with soap and water. Edema Control - Lymphedema / SCD / Other: Elevate legs to the level of the heart or above for 30 minutes daily and/or when sitting for 3-4 times a day throughout the day. Avoid standing for long periods of time. Other Edema Control Orders/Instructions: - Elevate/Rest legs throughout the day The following medication(s) was prescribed: lidocaine topical 4 % cream cream topical was prescribed at facility 07/18/2022: She has new skin tears on her arms and legs. Some of the old skin tears have healed. The old hematoma sites have some slough accumulation. With each successive visit, Ms. Valladarez has been increasingly distressed and agitated. I see in a note in the electronic medical record from January of this year that comfort measures were to be instituted. I am concerned that continued visits to the wound care center do not represent compassionate comfort care for this patient. I recommend that Medihoney to be applied to all of her current wounds and should any new skin tears open in the future, Medihoney and a nonstick dressing such as T or Xeroform be applied with a light gauze wrap. These recommendations can be managed by her skilled nursing facility. [facility] Electronic Signature(s) Signed: 07/18/2022 11:47:20 AM By: Fredirick Maudlin MD FACS Entered By: Fredirick Maudlin on 07/18/2022 11:47:20 [place], [name] ([id number] Page 7 of 8 -------------------------------------------------------------------------------- HxROS Details Patient Name: Date of Service: Cynthia [name] 07/18/2022 10:00 A M Medical Record Number: [id number] Patient Account Number: [id number] Date of Birth/Sex: Treating RN: 1931/08/02 (89 y.o. F) Primary Care Provider: Deland Pretty Other Clinician: Referring Provider: Treating Provider/Extender: Raylene Everts in Treatment: 58 Information Obtained  From Patient Eyes Medical History: Negative for: Cataracts; Glaucoma; Optic Neuritis Ear/Nose/Mouth/Throat Medical History: Negative for: Chronic sinus problems/congestion; Middle ear problems Hematologic/Lymphatic Medical History: Negative for: Anemia; Hemophilia; Human Immunodeficiency Virus; Lymphedema; Sickle Cell Disease Respiratory Medical History: Negative for: Aspiration; Asthma; Chronic Obstructive Pulmonary Disease (COPD); Pneumothorax; Sleep Apnea; Tuberculosis Cardiovascular Medical History: Positive for: Hypertension Gastrointestinal Medical History: Negative for: Cirrhosis ; Colitis; Crohns; Hepatitis A; Hepatitis B Endocrine Medical History: Negative for: Type I Diabetes; Type  II Diabetes Genitourinary Medical History: Negative for: End Stage Renal Disease Past Medical History Notes: polyneuropathy Integumentary (Skin) Medical History: Negative for: History of Burn Musculoskeletal Medical History: Negative for: Gout; Rheumatoid Arthritis; Osteoarthritis; Osteomyelitis Neurologic Medical History: Positive for: Dementia Negative for: Neuropathy; Paraplegia; Seizure Disorder Psychiatric Medical History: Negative for: [name], [name] ([id number]) [id number] Page 8 of 8 Immunizations Pneumococcal Vaccine: Received Pneumococcal Vaccination: Yes Received Pneumococcal Vaccination On or After 60th Birthday: Yes Implantable Devices None Hospitalization / Surgery History Type of Hospitalization/Surgery Orif 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 Breast surgery- unknown Family and Social History Unknown History: Yes; Never smoker; Marital Status - Married; Alcohol Use: Never; Drug Use: No History; Caffeine Use: Rarely; Financial Concerns: No; Food, Clothing or Shelter Needs: No; Support System Lacking: No; Transportation Concerns: No Electronic Signature(s) Signed: 07/18/2022  12:34:56 PM By: Fredirick Maudlin MD FACS Entered By: Fredirick Maudlin on 07/18/2022 11:37:41 -------------------------------------------------------------------------------- SuperBill Details Patient Name: Date of Service: [name], Cynthia [place] 07/18/2022 Medical Record Number: [id number] Patient Account Number: [id number] Date of Birth/Sex: Treating RN: 28-Nov-1931 (89 y.o. [name] Primary Care Provider: Deland Pretty Other Clinician: Referring Provider: Treating Provider/Extender: Raylene Everts in Treatment: 19 Diagnosis Coding ICD-10 Codes Code Description [id number] Non-pressure chronic ulcer of other part of right lower leg with fat layer exposed L97.822 Non-pressure chronic ulcer of other part of left lower leg with fat layer exposed I82.492 Acute embolism and thrombosis of other specified deep vein of left lower extremity Dementia in other diseases classified elsewhere, unspecified severity, without behavioral disturbance, psychotic disturbance, mood F02.80 disturbance, and anxiety I82.491 Acute embolism and thrombosis of other specified deep vein of right lower extremity Facility Procedures : CPT4 Code: [id number] Description: [facility] VISIT-LEV 5 EST PT Modifier: Quantity: 1 Physician Procedures : CPT4: Description Modifier Code [id number] - WC PHYS LEVEL 3 - EST PT ICD-10 Diagnosis Description L97.812 Non-pressure chronic ulcer of other part of right lower leg with fat layer exposed L97.822 Non-pressure chronic ulcer of other part of left  lower leg with fat layer exposed F02.80 Dementia in other diseases classified elsewhere, unspecified severity, without behavioral disturbance, disturbance, mood disturbance, and anxiety Quantity: 1 psychotic Electronic Signature(s) Signed: 07/18/2022 11:47:41 AM By: Fredirick Maudlin MD FACS Entered By: Fredirick Maudlin on 07/18/2022 11:47:41

## 2022-07-18 NOTE — Progress Notes (Signed)
[name], [name] ([id number] Page 1 of 21 Visit Report for 07/18/2022 Arrival Information Details Patient Name: Date of Service: [name] 07/18/2022 10:00 A M Medical Record Number: [id number] Patient Account Number: [id number] Date of Birth/Sex: Treating RN: 10/14/1931 (89 y.o. [name] Primary Care [name]: [name] Other Clinician: Referring [name]: Treating [name]/Extender: [name] in Treatment: 62 Visit Information History Since Last Visit Added or deleted any medications: No Patient Arrived: Wheel Chair Any new allergies or adverse reactions: No Arrival Time: 10:00 Had a fall or experienced change in No Accompanied By: caregiver activities of daily living that may affect Transfer Assistance: Manual risk of falls: Patient Identification Verified: Yes Signs or symptoms of abuse/neglect since last visito No Secondary Verification Process Completed: Yes Hospitalized since last visit: No Patient Requires Transmission-Based Precautions: No Implantable device outside of the clinic excluding No Patient Has Alerts: No cellular tissue based products placed in the center since last visit: Has Dressing in Place as Prescribed: Yes Pain Present Now: Unable to Respond Electronic Signature(s) Signed: 07/18/2022 3:18:09 PM By: [name] Entered By: [name] on 07/18/2022 10:01:12 -------------------------------------------------------------------------------- Clinic Level of Care Assessment Details Patient Name: Date of Service: [name] 07/18/2022 10:00 A M Medical Record Number: [id number] Patient Account Number: [id number] Date of Birth/Sex: Treating RN: 18-Jan-1932 (89 y.o. [name] Primary Care Paul Torpey: [name] Other Clinician: Referring [name]: Treating [name]/Extender: [name] in Treatment: 19 Clinic  Level of Care Assessment Items TOOL 4 Quantity Score X- 1 0 Use when only an EandM is performed on FOLLOW-UP visit ASSESSMENTS - Nursing Assessment / Reassessment []  - 0 Reassessment of Co-morbidities (includes updates in patient status) []  - 0 Reassessment of Adherence to Treatment Plan ASSESSMENTS - Wound and Skin A ssessment / Reassessment []  - 0 Simple Wound Assessment / Reassessment - one wound X- 11 5 Complex Wound Assessment / Reassessment - multiple wounds []  - 0 Dermatologic / Skin Assessment (not related to wound area) ASSESSMENTS - Focused Assessment []  - 0 Circumferential Edema Measurements - multi extremities []  - 0 Nutritional Assessment / Counseling / Intervention []  - 0 Lower Extremity Assessment (monofilament, tuning fork, pulses) []  - 0 Peripheral Arterial Disease Assessment (using hand held doppler) ASSESSMENTS - Ostomy and/or Continence Assessment and Care []  - 0 Incontinence Assessment and Management []  - 0 Ostomy Care Assessment and Management (repouching, etc.) PROCESS - Coordination of Care X - Simple Patient / Family Education for ongoing care 1 [address] [place] C ([id number] Page 2 of 21 []  - 0 Complex (extensive) Patient / Family Education for ongoing care X- 1 10 Staff obtains Programmer, systems, Records, T Results / Process Orders est []  - 0 Staff telephones HHA, Nursing Homes / Clarify orders / etc []  - 0 Routine Transfer to another Facility (non-emergent condition) []  - 0 Routine Hospital Admission (non-emergent condition) []  - 0 New Admissions / Biomedical engineer / Ordering NPWT Apligraf, etc. , []  - 0 Emergency Hospital Admission (emergent condition) X- 1 10 Simple Discharge Coordination []  - 0 Complex (extensive) Discharge Coordination PROCESS - Special Needs []  - 0 Pediatric / Minor Patient Management []  - 0 Isolation Patient Management []  - 0 Hearing / Language / Visual special  needs []  - 0 Assessment of Community assistance (transportation, D/C planning, etc.) []  - 0 Additional assistance / Altered mentation []  - 0 Support Surface(s) Assessment (bed, cushion, seat, etc.) INTERVENTIONS - Wound Cleansing / Measurement []  -  0 Simple Wound Cleansing - one wound X- 1 5 Complex Wound Cleansing - multiple wounds X- 1 5 Wound Imaging (photographs - any number of wounds) []  - 0 Wound Tracing (instead of photographs) []  - 0 Simple Wound Measurement - one wound X- 11 5 Complex Wound Measurement - multiple wounds INTERVENTIONS - Wound Dressings []  - 0 Small Wound Dressing one or multiple wounds []  - 0 Medium Wound Dressing one or multiple wounds X- 2 20 Large Wound Dressing one or multiple wounds []  - 0 Application of Medications - topical []  - 0 Application of Medications - injection INTERVENTIONS - Miscellaneous []  - 0 External ear exam []  - 0 Specimen Collection (cultures, biopsies, blood, body fluids, etc.) []  - 0 Specimen(s) / Culture(s) sent or taken to Lab for analysis []  - 0 Patient Transfer (multiple staff / Civil Service fast streamer / Similar devices) []  - 0 Simple Staple / Suture removal (25 or less) []  - 0 Complex Staple / Suture removal (26 or more) []  - 0 Hypo / Hyperglycemic Management (close monitor of Blood Glucose) []  - 0 Ankle / Brachial Index (ABI) - do not check if billed separately X- 1 5 Vital Signs Has the patient been seen at the hospital within the last three years: Yes Total Score: 200 Level Of Care: New/Established - Level 5 Electronic Signature(s) Signed: 07/18/2022 3:18:09 PM By: [name] Entered By: [name] on 07/18/2022 11:25:06 [name] ([id number] Page 3 of 21 -------------------------------------------------------------------------------- Encounter Discharge Information Details Patient Name: Date of Service: [name] 07/18/2022 10:00 A M Medical  Record Number: [id number] Patient Account Number: [id number] Date of Birth/Sex: Treating RN: [birth year] (89 y.o. [name] Primary Care Wayden Schwertner: [name] Other Clinician: Referring Azlynn Mitnick: Treating [name]/Extender: [name] in Treatment: 19 Encounter Discharge Information Items Discharge Condition: Stable Ambulatory Status: Wheelchair Discharge Destination: [place] Telephoned: No Orders Sent: Yes Transportation: Private Auto Accompanied By: caregiver Schedule Follow-up Appointment: No Clinical Summary of Care: Patient Declined Electronic Signature(s) Signed: 07/18/2022 3:18:09 PM By: [name] Entered By: [name] on 07/18/2022 11:52:14 -------------------------------------------------------------------------------- Lower Extremity Assessment Details Patient Name: Date of Service: Cynthia RDSCathlean Marseilles 07/18/2022 10:00 A M Medical Record Number: [id number] Patient Account Number: [id number] Date of Birth/Sex: Treating RN: February 02, 1932 (89 y.o. [name] Primary Care Sabri Teal: [name] Other Clinician: Referring Kamran Coker: Treating Effa Yarrow/Extender: [name] in Treatment: 19 Edema Assessment Assessed: Shirlyn Goltz: No] Patrice Paradise: No] [Left: Edema] [Right: :] Calf Left: Right: Point of Measurement: From Medial Instep 27.5 cm 27.5 cm Ankle Left: Right: Point of Measurement: From Medial Instep 19.5 cm 20 cm Electronic Signature(s) Signed: 07/18/2022 3:18:09 PM By: [name] Entered By: [name] on 07/18/2022 10:38:35 -------------------------------------------------------------------------------- Multi Wound Chart Details Patient Name: Date of Service: Cynthia [name], [name]. 07/18/2022 10:00 A M Medical Record Number: [id number] Patient Account Number: [id number] Date of Birth/Sex: Treating RN: 09-09-1931 (89 y.o. F) Primary Care Brina Umeda: [name]  Other Clinician: Referring Janya Eveland: Treating Amalya Salmons/Extender: [name] in Treatment: 19 Vital Signs Height(in): 66 Pulse(bpm): 95 Weight(lbs): 133 Blood Pressure(mmHg): 124/87 DAAIYAH, ROGGENKAMP ([id number]) [id number] Page 4 of 21 Body Mass Index(BMI): 21.5 Temperature(F): 98.2 Respiratory Rate(breaths/min): 20 [10:Photos:] Left T Third oe Right, Proximal, Anterior Lower Leg Right, Lateral Lower Leg Wound Location: Blister Trauma Trauma Wounding Event: Pressure Ulcer Skin T ear Skin T ear Primary Etiology: Hypertension, Dementia Hypertension, Dementia Hypertension, Dementia Comorbid History: 05/12/2022 06/22/2022 06/22/2022 Date  Acquired: 9 3 3  Weeks of Treatment: Open Open Open Wound Status: No No No Wound Recurrence: No Yes Yes Clustered Wound: N/A N/A N/A Clustered Quantity: 0.7x0.8x0.1 5.5x3x0.1 13x6.8x0.1 Measurements L x W x D (cm) 0.44 12.959 69.429 A (cm) : rea 0.044 1.296 6.943 Volume (cm) : -1319.40% 31.30% -160.00% % Reduction in A rea: -1366.70% 65.60% -160.00% % Reduction in Volume: Category/Stage II Full Thickness Without Exposed Full Thickness Without Exposed Classification: Support Structures Support Structures Medium Medium Medium Exudate A mount: Serosanguineous Serosanguineous Serosanguineous Exudate Type: red, brown red, brown red, brown Exudate Color: Distinct, outline attached Distinct, outline attached Distinct, outline attached Wound Margin: Small (1-33%) Medium (34-66%) Small (1-33%) Granulation Amount: Red Red Pink, Pale Granulation Quality: Large (67-100%) Medium (34-66%) Large (67-100%) Necrotic Amount: Adherent [place] Necrotic Tissue: Fat Layer (Subcutaneous Tissue): Yes Fat Layer (Subcutaneous Tissue): Yes Fat Layer (Subcutaneous Tissue): Yes Exposed Structures: Fascia: No Fascia: No Fascia: No Tendon: No Tendon: No Tendon:  No Muscle: No Muscle: No Muscle: No Joint: No Joint: No Joint: No Bone: No Bone: No Bone: No None Small (1-33%) None Epithelialization: No Abnormalities Noted No Abnormalities Noted No Abnormalities Noted Periwound Skin Texture: No Abnormalities Noted No Abnormalities Noted No Abnormalities Noted Periwound Skin Moisture: Rubor: Yes Ecchymosis: Yes Ecchymosis: Yes Periwound Skin Color: Hemosiderin Staining: Yes No Abnormality No Abnormality No Abnormality Temperature: Yes Yes Yes Tenderness on Palpation: Wound Number: [id number]  Photos: Left, Anterior Lower Leg Left Upper Leg Left Upper Arm Wound Location: Trauma Skin Tear/Laceration Skin Tear/Laceration Wounding Event: Skin Tear Skin Tear Skin Tear Primary Etiology: Hypertension, Dementia Hypertension, Dementia Hypertension, Dementia Comorbid History: 06/22/2022 07/18/2022 07/18/2022 Date Acquired: 3 0 0 Weeks of Treatment: Open Open Open Wound Status: No No No Wound Recurrence: No No Yes Clustered Wound: N/A N/A 3 Clustered Quantity: 0.9x0.7x0.1 3x1.2x0.1 14.5x8.5x0.1 Measurements L x W x D (cm) 0.495 2.827 96.8 A (cm) : rea 0.049 0.283 9.68 Volume (cm) : 94.40% N/A N/A % Reduction in A rea: 94.40% N/A N/A % Reduction in Volume: Full Thickness Without Exposed Full Thickness Without Exposed Full Thickness Without Exposed Classification: Support Structures Support Structures Support Structures Blades, Tilden Mitchell ([id number] Page 5 of 21 Medium Medium Medium Exudate A mount: Serosanguineous Serosanguineous Serosanguineous Exudate Type: red, brown red, brown red, brown Exudate Color: Distinct, outline attached Flat and Intact Flat and Intact Wound Margin: Large (67-100%) Small (1-33%) Large (67-100%) Granulation Amount: Pink, Pale Red Red Granulation Quality: Small (1-33%) Large (67-100%) None Present (0%) Necrotic Amount: Eschar, Adherent Slough Adherent Slough  N/A Necrotic Tissue: Fat Layer (Subcutaneous Tissue): Yes Fat Layer (Subcutaneous Tissue): Yes Fat Layer (Subcutaneous Tissue): Yes Exposed Structures: Fascia: No Fascia: No Tendon: No Tendon: No Muscle: No Muscle: No Joint: No Joint: No Bone: No Bone: No None Small (1-33%) Medium (34-66%) Epithelialization: No Abnormalities Noted No Abnormalities Noted No Abnormalities Noted Periwound Skin Texture: No Abnormalities Noted No Abnormalities Noted No Abnormalities Noted Periwound Skin Moisture: Ecchymosis: Yes Ecchymosis: Yes Ecchymosis: Yes Periwound Skin Color: Hemosiderin Staining: Yes No Abnormality No Abnormality No Abnormality Temperature: Yes Yes Yes Tenderness on Palpation: Wound Number: [id number]  Photos: Left, Lateral Knee Right, Dorsal Foot Left, Medial Lower Leg Wound Location: Skin T ear/Laceration Gradually Appeared Skin T ear/Laceration Wounding Event: Skin T ear Venous Leg Ulcer Venous Leg Ulcer Primary Etiology: Hypertension, Dementia Hypertension, Dementia Hypertension, Dementia Comorbid History: 07/18/2022 07/18/2022 02/04/2022 Date Acquired: 0 0 19 Weeks of Treatment: Open Open Healed - Epithelialized Wound Status: No  No No Wound Recurrence: No No Yes Clustered Wound: N/A N/A N/A Clustered Quantity: 4x2x0.1 5.9x6.9x0.1 0x0x0 Measurements L x W x D (cm) 6.283 31.974 0 A (cm) : rea 0.628 3.197 0 Volume (cm) : N/A N/A 100.00% % Reduction in A rea: N/A N/A 100.00% % Reduction in Volume: Full Thickness Without Exposed Full Thickness Without Exposed Full Thickness Without Exposed Classification: Support Structures Support Structures Support Structures Medium Medium None Present Exudate A mount: Serosanguineous Serosanguineous N/A Exudate Type: red, brown red, brown N/A Exudate Color: Distinct, outline attached Distinct, outline attached Flat and Intact Wound Margin: Medium (34-66%) Small (1-33%) None Present (0%) Granulation  Amount: Red, Pink Red N/A Granulation Quality: Medium (34-66%) Large (67-100%) None Present (0%) Necrotic Amount: Adherent Slough Eschar, Adherent Slough N/A Necrotic Tissue: Fat Layer (Subcutaneous Tissue): Yes Fat Layer (Subcutaneous Tissue): Yes Fascia: No Exposed Structures: Fascia: No Fascia: No Fat Layer (Subcutaneous Tissue): No Tendon: No Tendon: No Tendon: No Muscle: No Muscle: No Muscle: No Joint: No Joint: No Joint: No Bone: No Bone: No Bone: No Small (1-33%) None Large (67-100%) Epithelialization: No Abnormalities Noted No Abnormalities Noted No Abnormalities Noted Periwound Skin Texture: No Abnormalities Noted Maceration: No Periwound Skin Moisture: Dry/Scaly: No Ecchymosis: Yes Ecchymosis: Yes No Abnormalities Noted Periwound Skin Color: No Abnormality No Abnormality No Abnormality Temperature: Yes Yes N/A Tenderness on Palpation: Wound Number: [id number]  Photos: Right, Medial Lower Leg Left, Lateral Lower Leg Left, Posterior Lower Leg Wound Location: Blister Blister Blister Wounding EventBRYNNAN, MAZYCK ([id number] Page 6 of 21 Cellulitis Venous Leg Ulcer Venous Leg Ulcer Primary Etiology: Hypertension, Dementia Hypertension, Dementia Hypertension, Dementia Comorbid History: 04/18/2022 04/17/2022 04/17/2022 Date Acquired: 12 12 12  Weeks of Treatment: Open Open Open Wound Status: No No No Wound Recurrence: No Yes No Clustered Wound: N/A 3 N/A Clustered Quantity: 2x1.8x0.2 21.5x6x0.1 1x1.3x0.1 Measurements L x W x D (cm) 2.827 101.316 1.021 A (cm) : rea 0.565 10.132 0.102 Volume (cm) : 62.10% -2587.40% -62.60% % Reduction in A rea: 24.30% -2587.50% -61.90% % Reduction in Volume: Full Thickness With Exposed Support Full Thickness Without Exposed Full Thickness Without Exposed Classification: Structures Support Structures Support Structures Medium Medium Medium Exudate A mount: Serosanguineous  Serosanguineous Serosanguineous Exudate Type: red, brown red, brown red, brown Exudate Color: Distinct, outline attached Distinct, outline attached Distinct, outline attached Wound Margin: Small (1-33%) Small (1-33%) Large (67-100%) Granulation Amount: Red, Pink Red, Hyper-granulation Red, Pink Granulation Quality: Large (67-100%) Large (67-100%) None Present (0%) Necrotic Amount: Adherent Slough Adherent Slough N/A Necrotic Tissue: Fat Layer (Subcutaneous Tissue): Yes Fat Layer (Subcutaneous Tissue): Yes Fat Layer (Subcutaneous Tissue): Yes Exposed Structures: Muscle: Yes Fascia: No Fascia: No Fascia: No Tendon: No Tendon: No Tendon: No Muscle: No Muscle: No Joint: No Joint: No Joint: No Bone: No Bone: No Bone: No None None Medium (34-66%) Epithelialization: No Abnormalities Noted No Abnormalities Noted No Abnormalities Noted Periwound Skin Texture: Maceration: No No Abnormalities Noted Maceration: No Periwound Skin Moisture: Dry/Scaly: No Dry/Scaly: No Ecchymosis: Yes Ecchymosis: Yes Ecchymosis: Yes Periwound Skin Color: Hemosiderin Staining: Yes Hemosiderin Staining: Yes Rubor: Yes Rubor: Yes Rubor: Yes No Abnormality No Abnormality No Abnormality Temperature: Yes Yes Yes Tenderness on Palpation: Wound Number: 8 N/A N/A Photos: N/A N/A Right, Lateral Knee N/A N/A Wound Location: Skin T ear/Laceration N/A N/A Wounding Event: Trauma, Other N/A N/A Primary Etiology: Hypertension, Dementia N/A N/A Comorbid History: 05/12/2022 N/A N/A Date Acquired: 9 N/A N/A Weeks of Treatment: Healed - Epithelialized N/A N/A Wound Status: No N/A  N/A Wound Recurrence: No N/A N/A Clustered Wound: N/A N/A N/A Clustered Quantity: 0x0x0 N/A N/A Measurements L x W x D (cm) 0 N/A N/A A (cm) : rea 0 N/A N/A Volume (cm) : 100.00% N/A N/A % Reduction in A rea: 100.00% N/A N/A % Reduction in Volume: Full Thickness Without Exposed N/A  N/A Classification: Support Structures None Present N/A N/A Exudate A mount: N/A N/A N/A Exudate Type: N/A N/A N/A Exudate Color: Flat and Intact N/A N/A Wound Margin: None Present (0%) N/A N/A Granulation Amount: N/A N/A N/A Granulation Quality: None Present (0%) N/A N/A Necrotic Amount: N/A N/A N/A Necrotic Tissue: Fascia: No N/A N/A Exposed Structures: Fat Layer (Subcutaneous Tissue): No Tendon: No Muscle: No Joint: No Bone: No Large (67-100%) N/A N/A Epithelialization: No Abnormalities Noted N/A N/A Periwound Skin Texture: No Abnormalities Noted N/A N/A Periwound Skin Moisture: No Abnormalities Noted N/A N/A Periwound Skin Color: No Abnormality N/A N/A TemperatureGEETHA, HORNICK C ([id number] Page 7 of 21 Treatment Notes Electronic Signature(s) Signed: 07/18/2022 11:03:51 AM By: Fredirick Maudlin MD FACS Entered By: Fredirick Maudlin on 07/18/2022 11:03:51 -------------------------------------------------------------------------------- Multi-Disciplinary Care Plan Details Patient Name: Date of Service: Cynthia [name], [name]. 07/18/2022 10:00 A M Medical Record Number: [id number] Patient Account Number: [id number] Date of Birth/Sex: Treating RN: 1931-04-29 (89 y.o. [name] Primary Care [place]: [name] Other Clinician: Referring Jazzmen Restivo: Treating Yoshino Broccoli/Extender: [name] in Treatment: 19 Active Inactive Electronic Signature(s) Signed: 07/18/2022 3:18:09 PM By: Sabas Sous By: [name] on 07/18/2022 11:23:54 -------------------------------------------------------------------------------- Pain Assessment Details Patient Name: Date of Service: Cynthia [name], [name]. 07/18/2022 10:00 A M Medical Record Number: [id number] Patient Account Number: [id number] Date of Birth/Sex: Treating RN: [birth year] (89 y.o. [name] Primary Care Atisha Hamidi: [name] Other Clinician: Referring Ronin Crager: Treating Makesha Belitz/Extender: [name] in Treatment: 19 Active Problems Location of Pain Severity and Description of Pain Patient Has Paino Patient Unable to Respond Site Locations Pain Management and Medication Current Pain Management: Electronic Signature(s) Signed: 07/18/2022 3:18:09 PM By: [name] Entered By: [name] on 07/18/2022 10:01:22 Cosner, Tilden Mitchell ([id number] Page 8 of 21 -------------------------------------------------------------------------------- Patient/Caregiver Education Details Patient Name: Date of Service: Cynthia [name], EV ELYN C. 4/2/2024andnbsp10:00 A M Medical Record Number: [id number] Patient Account Number: [id number] Date of Birth/Gender: Treating RN: 1932/02/28 (89 y.o. [name] Primary Care Physician: [name] Other Clinician: Referring Physician: Treating Physician/Extender: [name] in Treatment: 19 Education Assessment Education Provided To: Caregiver Education Topics Provided Safety: Methods: Explain/Verbal Responses: Reinforcements needed, State content correctly [employer]) Signed: 07/18/2022 3:18:09 PM By: [name] Entered By: [name] on 07/18/2022 11:24:09 -------------------------------------------------------------------------------- Wound Assessment Details Patient Name: Date of Service: Cynthia [name], [name]. 07/18/2022 10:00 A M Medical Record Number: [id number] Patient Account Number: [id number] Date of Birth/Sex: Treating RN: Feb 02, 1932 (89 y.o. [name] Primary Care Lexy Meininger: [name] Other Clinician: Referring Kymberlee Viger: Treating Jazmyne Beauchesne/Extender: [name] in Treatment: 19 Wound Status Wound Number: 10 Primary Etiology: Pressure Ulcer Wound Location: Left T Third oe Wound Status:  Open Wounding Event: Blister Comorbid History: Hypertension, Dementia Date Acquired: 05/12/2022 Weeks Of Treatment: 9 Clustered Wound: No Photos Wound Measurements Length: (cm) 0.7 Width: (cm) 0.8 Depth: (cm) 0.1 Area: (cm) 0.44 Volume: (cm) 0.044 % Reduction in Area: -1319.4% % Reduction in Volume: -1366.7% Epithelialization: None Tunneling: No Undermining: No Wound Description Classification: Category/Stage II Wound Margin: Distinct, outline attached Exudate  Amount: Medium Exudate Type: Serosanguineous Deskins, Jacarra C ([id number]) Exudate Color: red, brown Foul Odor After Cleansing: No Slough/Fibrino Yes [id number] Page 9 of 21 Wound Bed Granulation Amount: Small (1-33%) Exposed Structure Granulation Quality: Red Fascia Exposed: No Necrotic Amount: Large (67-100%) Fat Layer (Subcutaneous Tissue) Exposed: Yes Necrotic Quality: Adherent Slough Tendon Exposed: No Muscle Exposed: No Joint Exposed: No Bone Exposed: No Periwound Skin Texture Texture Color No Abnormalities Noted: Yes No Abnormalities Noted: No Rubor: Yes Moisture No Abnormalities Noted: Yes Temperature / Pain Temperature: No Abnormality Tenderness on Palpation: Yes Electronic Signature(s) Signed: 07/18/2022 3:18:09 PM By: [name] Signed: 07/18/2022 4:19:23 PM By: Baruch Gouty RN, BSN Entered By: Baruch Gouty on 07/18/2022 10:34:21 -------------------------------------------------------------------------------- Wound Assessment Details Patient Name: Date of Service: Cynthia [name], [name]. 07/18/2022 10:00 A M Medical Record Number: [id number] Patient Account Number: [id number] Date of Birth/Sex: Treating RN: Jul 14, 1931 (89 y.o. [name] Primary Care Stacyann Mcconaughy: [name] Other Clinician: Referring Rosamond Andress: Treating Jonathandavid Marlett/Extender: [name] in Treatment: 19 Wound Status Wound Number: 11 Primary Etiology: Skin  Tear Wound Location: Right, Proximal, Anterior Lower Leg Wound Status: Open Wounding Event: Trauma Comorbid History: Hypertension, Dementia Date Acquired: 06/22/2022 Weeks Of Treatment: 3 Clustered Wound: Yes Photos Wound Measurements Length: (cm) 5.5 Width: (cm) 3 Depth: (cm) 0.1 Area: (cm) 12.959 Volume: (cm) 1.296 % Reduction in Area: 31.3% % Reduction in Volume: 65.6% Epithelialization: Small (1-33%) Tunneling: No Undermining: No Wound Description Classification: Full Thickness Without Exposed Support Structures Wound Margin: Distinct, outline attached Exudate Amount: Medium Exudate Type: Serosanguineous Exudate Color: red, brown Birdsell, Emine C ([id number]) Wound Bed Granulation Amount: Medium (34-66%) Granulation Quality: Red Necrotic Amount: Medium (34-66%) Necrotic Quality: Adherent Slough Foul Odor After Cleansing: No Slough/Fibrino Yes [id number] Page 10 of 21 Exposed Structure Fascia Exposed: No Fat Layer (Subcutaneous Tissue) Exposed: Yes Tendon Exposed: No Muscle Exposed: No Joint Exposed: No Bone Exposed: No Periwound Skin Texture Texture Color No Abnormalities Noted: Yes No Abnormalities Noted: No Ecchymosis: Yes Moisture No Abnormalities Noted: Yes Temperature / Pain Temperature: No Abnormality Tenderness on Palpation: Yes Electronic Signature(s) Signed: 07/18/2022 3:18:09 PM By: [name] Signed: 07/18/2022 4:19:23 PM By: Baruch Gouty RN, BSN Entered By: Baruch Gouty on 07/18/2022 10:34:52 -------------------------------------------------------------------------------- Wound Assessment Details Patient Name: Date of Service: Cynthia [name], [name]. 07/18/2022 10:00 A M Medical Record Number: [id number] Patient Account Number: [id number] Date of Birth/Sex: Treating RN: [birth year] (89 y.o. [name] Primary Care Aldene Hendon: [name] Other Clinician: Referring Carnell Casamento: Treating  Ritisha Deitrick/Extender: [name] in Treatment: 19 Wound Status Wound Number: 12 Primary Etiology: Skin Tear Wound Location: Right, Lateral Lower Leg Wound Status: Open Wounding Event: Trauma Comorbid History: Hypertension, Dementia Date Acquired: 06/22/2022 Weeks Of Treatment: 3 Clustered Wound: Yes Photos Wound Measurements Length: (cm) 13 Width: (cm) 6.8 Depth: (cm) 0.1 Area: (cm) 69.429 Volume: (cm) 6.943 % Reduction in Area: -160% % Reduction in Volume: -160% Epithelialization: None Tunneling: No Undermining: No Wound Description Classification: Full Thickness Without Exposed Support Wound Margin: Distinct, outline attached Exudate Amount: Medium Exudate Type: Serosanguineous Exudate Color: red, brown Structures Foul Odor After Cleansing: No Slough/Fibrino Yes Wound Bed Granulation Amount: Small (1-33%) Exposed KERIANN, GALLOGLY C ([id number] Page 11 of 21 Granulation Quality: Pink, Pale Fascia Exposed: No Necrotic Amount: Large (67-100%) Fat Layer (Subcutaneous Tissue) Exposed: Yes Necrotic Quality: Adherent Slough Tendon Exposed: No Muscle Exposed: No Joint Exposed: No Bone Exposed: No Periwound Skin Texture Texture Color No Abnormalities Noted: Yes No  Abnormalities Noted: No Ecchymosis: Yes Moisture Hemosiderin Staining: Yes No Abnormalities Noted: Yes Temperature / Pain Temperature: No Abnormality Tenderness on Palpation: Yes Electronic Signature(s) Signed: 07/18/2022 3:18:09 PM By: [name] Signed: 07/18/2022 4:19:23 PM By: Baruch Gouty RN, BSN Entered By: Baruch Gouty on 07/18/2022 10:35:21 -------------------------------------------------------------------------------- Wound Assessment Details Patient Name: Date of Service: Cynthia [name], [name]. 07/18/2022 10:00 A M Medical Record Number: [id number] Patient Account Number: [id number] Date of Birth/Sex: Treating  RN: Nov 14, 1931 (89 y.o. [name] Primary Care Laramie Meissner: [name] Other Clinician: Referring Elany Felix: Treating Kameran Lallier/Extender: [name] in Treatment: 19 Wound Status Wound Number: 14 Primary Etiology: Skin Tear Wound Location: Left, Anterior Lower Leg Wound Status: Open Wounding Event: Trauma Comorbid History: Hypertension, Dementia Date Acquired: 06/22/2022 Weeks Of Treatment: 3 Clustered Wound: No Photos Wound Measurements Length: (cm) 0.9 Width: (cm) 0.7 Depth: (cm) 0.1 Area: (cm) 0.495 Volume: (cm) 0.049 % Reduction in Area: 94.4% % Reduction in Volume: 94.4% Epithelialization: None Tunneling: No Undermining: No Wound Description Classification: Full Thickness Without Exposed Sup Wound Margin: Distinct, outline attached Exudate Amount: Medium Exudate Type: Serosanguineous Exudate Color: red, brown port Structures Foul Odor After Cleansing: No Slough/Fibrino Yes Wound Bed Granulation Amount: Large (67-100%) Exposed Structure Granulation Quality: Pink, Pale Fascia Exposed: No Necrotic Amount: Small (1-33%) Fat Layer (Subcutaneous Tissue) Exposed: Yes GOLDYE, LEHENBAUER C ([id number] Page 12 of 21 Necrotic Quality: Eschar, Adherent Slough Tendon Exposed: No Muscle Exposed: No Joint Exposed: No Bone Exposed: No Periwound Skin Texture Texture Color No Abnormalities Noted: Yes No Abnormalities Noted: No Ecchymosis: Yes Moisture Hemosiderin Staining: Yes No Abnormalities Noted: Yes Temperature / Pain Temperature: No Abnormality Tenderness on Palpation: Yes Electronic Signature(s) Signed: 07/18/2022 3:18:09 PM By: [name] Signed: 07/18/2022 4:19:23 PM By: Baruch Gouty RN, BSN Entered By: Baruch Gouty on 07/18/2022 10:35:51 -------------------------------------------------------------------------------- Wound Assessment Details Patient Name: Date of  Service: Cynthia [name], [name]. 07/18/2022 10:00 A M Medical Record Number: [id number] Patient Account Number: [id number] Date of Birth/Sex: Treating RN: July 13, 1931 (89 y.o. [name] Primary Care Feleica Fulmore: [name] Other Clinician: Referring Deshan Hemmelgarn: Treating Feige Lowdermilk/Extender: [name] in Treatment: 19 Wound Status Wound Number: 15 Primary Etiology: Skin Tear Wound Location: Left Upper Leg Wound Status: Open Wounding Event: Skin Tear/Laceration Comorbid History: Hypertension, Dementia Date Acquired: 07/18/2022 Weeks Of Treatment: 0 Clustered Wound: No Photos Wound Measurements Length: (cm) 3 Width: (cm) 1.2 Depth: (cm) 0.1 Area: (cm) 2.827 Volume: (cm) 0.283 % Reduction in Area: % Reduction in Volume: Epithelialization: Small (1-33%) Tunneling: No Undermining: No Wound Description Classification: Full Thickness Without Exposed Suppor Wound Margin: Flat and Intact Exudate Amount: Medium Exudate Type: Serosanguineous Exudate Color: red, brown t Structures Foul Odor After Cleansing: No Slough/Fibrino Yes Wound Bed Granulation Amount: Small (1-33%) Exposed Structure Granulation Quality: Red Fascia Exposed: No Necrotic Amount: Large (67-100%) Fat Layer (Subcutaneous Tissue) Exposed: Yes Necrotic Quality: Adherent Slough Tendon Exposed: No Muscle Exposed: No LIZANNE, REI C ([id number] Page 13 of 21 Joint Exposed: No Bone Exposed: No Periwound Skin Texture Texture Color No Abnormalities Noted: Yes No Abnormalities Noted: No Ecchymosis: Yes Moisture No Abnormalities Noted: Yes Temperature / Pain Temperature: No Abnormality Tenderness on Palpation: Yes Electronic Signature(s) Signed: 07/18/2022 3:18:09 PM By: [name] Signed: 07/18/2022 4:19:23 PM By: Baruch Gouty RN, BSN Entered By: Baruch Gouty on 07/18/2022  10:36:17 -------------------------------------------------------------------------------- Wound Assessment Details Patient Name: Date of Service: Cynthia [name], [name]. 07/18/2022 10:00 A M Medical Record Number: [id number] Patient  Account Number: [id number] Date of Birth/Sex: Treating RN: 1931/05/25 (89 y.o. [name] Primary Care Aadith Raudenbush: [name] Other Clinician: Referring Tashanna Dolin: Treating Whittany Parish/Extender: [name] in Treatment: 19 Wound Status Wound Number: 16 Primary Etiology: Skin Tear Wound Location: Left Upper Arm Wound Status: Open Wounding Event: Skin Tear/Laceration Comorbid History: Hypertension, Dementia Date Acquired: 07/18/2022 Weeks Of Treatment: 0 Clustered Wound: Yes Photos Wound Measurements Length: (cm) Width: (cm) Depth: (cm) Clustered Quantity: Area: (cm) Volume: (cm) 14.5 % Reduction in Area: 8.5 % Reduction in Volume: 0.1 Epithelialization: Medium (34-66%) 3 Tunneling: No 96.8 Undermining: No 9.68 Wound Description Classification: Full Thickness Without Exposed Supp Wound Margin: Flat and Intact Exudate Amount: Medium Exudate Type: Serosanguineous Exudate Color: red, brown ort Structures Foul Odor After Cleansing: No Slough/Fibrino No Wound Bed Granulation Amount: Large (67-100%) Exposed Structure Granulation Quality: Red Fat Layer (Subcutaneous Tissue) Exposed: Yes Necrotic Amount: None Present (0%) Periwound Skin Texture Texture Color Rigsby, Shamra C ([id number] Page 14 of 21 No Abnormalities Noted: Yes No Abnormalities Noted: No Ecchymosis: Yes Moisture No Abnormalities Noted: Yes Temperature / Pain Temperature: No Abnormality Tenderness on Palpation: Yes Electronic Signature(s) Signed: 07/18/2022 3:18:09 PM By: [name] Signed: 07/18/2022 4:19:23 PM By: Baruch Gouty RN, BSN Entered By: Baruch Gouty on 07/18/2022  10:36:47 -------------------------------------------------------------------------------- Wound Assessment Details Patient Name: Date of Service: Cynthia [name], [name]. 07/18/2022 10:00 A M Medical Record Number: [id number] Patient Account Number: [id number] Date of Birth/Sex: Treating RN: 1932-03-28 (89 y.o. [name] Primary Care Jayse Hodkinson: [name] Other Clinician: Referring Shaleen Talamantez: Treating Dera Vanaken/Extender: [name] in Treatment: 19 Wound Status Wound Number: 17 Primary Etiology: Skin Tear Wound Location: Left, Lateral Knee Wound Status: Open Wounding Event: Skin Tear/Laceration Comorbid History: Hypertension, Dementia Date Acquired: 07/18/2022 Weeks Of Treatment: 0 Clustered Wound: No Photos Wound Measurements Length: (cm) 4 Width: (cm) 2 Depth: (cm) 0.1 Area: (cm) 6.283 Volume: (cm) 0.628 % Reduction in Area: % Reduction in Volume: Epithelialization: Small (1-33%) Tunneling: No Undermining: No Wound Description Classification: Full Thickness Without Exposed Support Structures Wound Margin: Distinct, outline attached Exudate Amount: Medium Exudate Type: Serosanguineous Exudate Color: red, brown Foul Odor After Cleansing: No Slough/Fibrino Yes Wound Bed Granulation Amount: Medium (34-66%) Exposed Structure Granulation Quality: Red, Pink Fascia Exposed: No Necrotic Amount: Medium (34-66%) Fat Layer (Subcutaneous Tissue) Exposed: Yes Necrotic Quality: Adherent Slough Tendon Exposed: No Muscle Exposed: No Joint Exposed: No Bone Exposed: No Periwound Skin Texture Texture Color No Abnormalities Noted: Yes No Abnormalities Noted: No Ecchymosis: Yes Moisture TEISHA, KAZMIERSKI C ([id number]) [id number] Page 15 of 21 No Abnormalities Noted: No Temperature / Pain Temperature: No Abnormality Tenderness on Palpation: Yes Electronic Signature(s) Signed: 07/18/2022 3:18:09 PM By: [name] Signed: 07/18/2022 4:19:23 PM By: Baruch Gouty RN, BSN Entered By: Baruch Gouty on 07/18/2022 10:38:01 -------------------------------------------------------------------------------- Wound Assessment Details Patient Name: Date of Service: Cynthia [name], [name]. 07/18/2022 10:00 A M Medical Record Number: [id number] Patient Account Number: [id number] Date of Birth/Sex: Treating RN: [birth year] (89 y.o. [name] Primary Care Jerett Odonohue: [name] Other Clinician: Referring Ethel Veronica: Treating Adeena Bernabe/Extender: [name] in Treatment: 19 Wound Status Wound Number: 18 Primary Etiology: Venous Leg Ulcer Wound Location: Right, Dorsal Foot Wound Status: Open Wounding Event: Gradually Appeared Comorbid History: Hypertension, Dementia Date Acquired: 07/18/2022 Weeks Of Treatment: 0 Clustered Wound: No Photos Wound Measurements Length: (cm) 5.9 Width: (cm) 6.9 Depth: (cm) 0.1 Area: (cm) 31.974 Volume: (cm) 3.197 % Reduction in Area: % Reduction  in Volume: Epithelialization: None Tunneling: No Undermining: No Wound Description Classification: Full Thickness Without Exposed Support Wound Margin: Distinct, outline attached Exudate Amount: Medium Exudate Type: Serosanguineous Exudate Color: red, brown Structures Foul Odor After Cleansing: No Slough/Fibrino Yes Wound Bed Granulation Amount: Small (1-33%) Exposed Structure Granulation Quality: Red Fascia Exposed: No Necrotic Amount: Large (67-100%) Fat Layer (Subcutaneous Tissue) Exposed: Yes Necrotic Quality: Eschar, Adherent Slough Tendon Exposed: No Muscle Exposed: No Joint Exposed: No Bone Exposed: No Periwound Skin Texture Texture Color No Abnormalities Noted: Yes No Abnormalities Noted: No Ecchymosis: Yes Moisture No Abnormalities Noted: Yes Temperature / Pain Temperature: No Abnormality Tenderness on Palpation: ELYSA, ROQUEMORE C ([id number] Page 16 of 21 Electronic Signature(s) Signed: 07/18/2022 3:18:09 PM By: [name] Signed: 07/18/2022 4:19:23 PM By: Baruch Gouty RN, BSN Entered By: Baruch Gouty on 07/18/2022 10:38:27 -------------------------------------------------------------------------------- Wound Assessment Details Patient Name: Date of Service: Cynthia [name], [name]. 07/18/2022 10:00 A M Medical Record Number: [id number] Patient Account Number: [id number] Date of Birth/Sex: Treating RN: 1931/06/20 (89 y.o. [name] Primary Care Miloh Alcocer: [name] Other Clinician: Referring Tomma Ehinger: Treating Farrell Pantaleo/Extender: [name] in Treatment: 19 Wound Status Wound Number: 2 Primary Etiology: Venous Leg Ulcer Wound Location: Left, Medial Lower Leg Wound Status: Healed - Epithelialized Wounding Event: Skin Tear/Laceration Comorbid History: Hypertension, Dementia Date Acquired: 02/04/2022 Weeks Of Treatment: 19 Clustered Wound: Yes Photos Wound Measurements Length: (cm) Width: (cm) Depth: (cm) Area: (cm) Volume: (cm) 0 % Reduction in Area: 100% 0 % Reduction in Volume: 100% 0 Epithelialization: Large (67-100%) 0 Tunneling: No 0 Undermining: No Wound Description Classification: Full Thickness Without Exposed Support Wound Margin: Flat and Intact Exudate Amount: None Present Structures Foul Odor After Cleansing: No Slough/Fibrino No Wound Bed Granulation Amount: None Present (0%) Exposed Structure Necrotic Amount: None Present (0%) Fascia Exposed: No Fat Layer (Subcutaneous Tissue) Exposed: No Tendon Exposed: No Muscle Exposed: No Joint Exposed: No Bone Exposed: No Periwound Skin Texture Texture Color No Abnormalities Noted: Yes No Abnormalities Noted: Yes Moisture Temperature / Pain No Abnormalities Noted: Yes Temperature: No Abnormality Electronic Signature(s) Signed: 07/18/2022 3:18:09 PM By: [name] Signed: 07/18/2022 4:19:23 PM By: Baruch Gouty RN, BSN Entered By: Baruch Gouty on 07/18/2022 10:39:00 Delle Reining ([id number] Page 17 of 21 -------------------------------------------------------------------------------- Wound Assessment Details Patient Name: Date of Service: [name] 07/18/2022 10:00 A M Medical Record Number: [id number] Patient Account Number: [id number] Date of Birth/Sex: Treating RN: [birth year] (89 y.o. [name] Primary Care Farrah Skoda: [name] Other Clinician: Referring Natanya Holecek: Treating Anees Vanecek/Extender: [name] in Treatment: 19 Wound Status Wound Number: 4 Primary Etiology: Cellulitis Wound Location: Right, Medial Lower Leg Wound Status: Open Wounding Event: Blister Comorbid History: Hypertension, Dementia Date Acquired: 04/18/2022 Weeks Of Treatment: 12 Clustered Wound: No Photos Wound Measurements Length: (cm) 2 Width: (cm) 1.8 Depth: (cm) 0.2 Area: (cm) 2.827 Volume: (cm) 0.565 % Reduction in Area: 62.1% % Reduction in Volume: 24.3% Epithelialization: None Tunneling: No Undermining: No Wound Description Classification: Full Thickness With Exposed Suppor Wound Margin: Distinct, outline attached Exudate Amount: Medium Exudate Type: Serosanguineous Exudate Color: red, brown t Structures Foul Odor After Cleansing: No Slough/Fibrino Yes Wound Bed Granulation Amount: Small (1-33%) Exposed Structure Granulation Quality: Red, Pink Fascia Exposed: No Necrotic Amount: Large (67-100%) Fat Layer (Subcutaneous Tissue) Exposed: Yes Necrotic Quality: Adherent Slough Tendon Exposed: No Muscle Exposed: Yes Necrosis of Muscle: No Joint Exposed: No Bone Exposed: No Periwound Skin Texture Texture Color  No Abnormalities Noted: Yes No Abnormalities Noted: No Ecchymosis: Yes Moisture Hemosiderin Staining: Yes No Abnormalities Noted:  No Rubor: Yes Dry / Scaly: No Maceration: No Temperature / Pain Temperature: No Abnormality Tenderness on Palpation: Yes Electronic Signature(s) Signed: 07/18/2022 3:18:09 PM By: [name] Signed: 07/18/2022 4:19:23 PM By: Baruch Gouty RN, BSN Entered By: Baruch Gouty on 07/18/2022 10:39:30 Balboni, Tilden Mitchell ([id number] Page 18 of 21 -------------------------------------------------------------------------------- Wound Assessment Details Patient Name: Date of Service: [name] 07/18/2022 10:00 A M Medical Record Number: [id number] Patient Account Number: [id number] Date of Birth/Sex: Treating RN: 10/23/1931 (89 y.o. [name] Primary Care Karris Deangelo: [name] Other Clinician: Referring Riva Sesma: Treating Gayle Collard/Extender: [name] in Treatment: 19 Wound Status Wound Number: 5 Primary Etiology: Venous Leg Ulcer Wound Location: Left, Lateral Lower Leg Wound Status: Open Wounding Event: Blister Comorbid History: Hypertension, Dementia Date Acquired: 04/17/2022 Weeks Of Treatment: 12 Clustered Wound: Yes Photos Wound Measurements Length: (cm) 21.5 Width: (cm) 6 Depth: (cm) 0.1 Clustered Quantity: 3 Area: (cm) 101.316 Volume: (cm) 10.132 % Reduction in Area: -2587.4% % Reduction in Volume: -2587.5% Epithelialization: None Tunneling: No Undermining: No Wound Description Classification: Full Thickness Without Exposed Supp Wound Margin: Distinct, outline attached Exudate Amount: Medium Exudate Type: Serosanguineous Exudate Color: red, brown ort Structures Foul Odor After Cleansing: No Slough/Fibrino Yes Wound Bed Granulation Amount: Small (1-33%) Exposed Structure Granulation Quality: Red, Hyper-granulation Fascia Exposed: No Necrotic Amount: Large (67-100%) Fat Layer (Subcutaneous Tissue) Exposed: Yes Necrotic Quality: Adherent Slough Tendon Exposed: No Muscle  Exposed: No Joint Exposed: No Bone Exposed: No Periwound Skin Texture Texture Color No Abnormalities Noted: Yes No Abnormalities Noted: No Ecchymosis: Yes Moisture Hemosiderin Staining: Yes No Abnormalities Noted: Yes Rubor: Yes Temperature / Pain Temperature: No Abnormality Tenderness on Palpation: Yes Electronic Signature(s) Signed: 07/18/2022 3:18:09 PM By: [name] Signed: 07/18/2022 4:19:23 PM By: Baruch Gouty RN, BSN Entered By: Baruch Gouty on 07/18/2022 10:40:00 Delle Reining ([id number] Page 19 of 21 -------------------------------------------------------------------------------- Wound Assessment Details Patient Name: Date of Service: [name] 07/18/2022 10:00 A M Medical Record Number: [id number] Patient Account Number: [id number] Date of Birth/Sex: Treating RN: [birth year] (89 y.o. [name] Primary Care Pura Picinich: [name] Other Clinician: Referring Elyssia Strausser: Treating Ohana Birdwell/Extender: [name] in Treatment: 19 Wound Status Wound Number: 6 Primary Etiology: Venous Leg Ulcer Wound Location: Left, Posterior Lower Leg Wound Status: Open Wounding Event: Blister Comorbid History: Hypertension, Dementia Date Acquired: 04/17/2022 Weeks Of Treatment: 12 Clustered Wound: No Photos Wound Measurements Length: (cm) 1 Width: (cm) 1.3 Depth: (cm) 0.1 Area: (cm) 1.021 Volume: (cm) 0.102 % Reduction in Area: -62.6% % Reduction in Volume: -61.9% Epithelialization: Medium (34-66%) Tunneling: No Undermining: No Wound Description Classification: Full Thickness Without Exposed Support Structures Wound Margin: Distinct, outline attached Exudate Amount: Medium Exudate Type: Serosanguineous Exudate Color: red, brown Foul Odor After Cleansing: No Slough/Fibrino No Wound Bed Granulation Amount: Large (67-100%) Exposed Structure Granulation Quality: Red,  Pink Fascia Exposed: No Necrotic Amount: None Present (0%) Fat Layer (Subcutaneous Tissue) Exposed: Yes Tendon Exposed: No Muscle Exposed: No Joint Exposed: No Bone Exposed: No Periwound Skin Texture Texture Color No Abnormalities Noted: Yes No Abnormalities Noted: No Ecchymosis: Yes Moisture Rubor: Yes No Abnormalities Noted: No Dry / Scaly: No Temperature / Pain Maceration: No Temperature: No Abnormality Tenderness on Palpation: Yes Electronic Signature(s) Signed: 07/18/2022 3:18:09 PM By: [name] Signed: 07/18/2022 4:19:23 PM By: Baruch Gouty RN, BSN Entered By: Baruch Gouty  on 07/18/2022 10:40:26 ASHAWNA, KIRTLEY ([id number]) [phone].pdf Page 20 of 21 -------------------------------------------------------------------------------- Wound Assessment Details Patient Name: Date of Service: [name] 07/18/2022 10:00 A M Medical Record Number: [id number] Patient Account Number: [id number] Date of Birth/Sex: Treating RN: [birth year] (89 y.o. [name] Primary Care Amelio Brosky: [name] Other Clinician: Referring Decie Verne: Treating Abe Schools/Extender: [name] in Treatment: 19 Wound Status Wound Number: 8 Primary Etiology: Trauma, Other Wound Location: Right, Lateral Knee Wound Status: Healed - Epithelialized Wounding Event: Skin Tear/Laceration Comorbid History: Hypertension, Dementia Date Acquired: 05/12/2022 Weeks Of Treatment: 9 Clustered Wound: No Photos Wound Measurements Length: (cm) Width: (cm) Depth: (cm) Area: (cm) Volume: (cm) 0 % Reduction in Area: 100% 0 % Reduction in Volume: 100% 0 Epithelialization: Large (67-100%) 0 Tunneling: No 0 Undermining: No Wound Description Classification: Full Thickness Without Exposed Support Wound Margin: Flat and Intact Exudate Amount: None Present Structures Foul Odor After Cleansing: No Slough/Fibrino No Wound  Bed Granulation Amount: None Present (0%) Exposed Structure Necrotic Amount: None Present (0%) Fascia Exposed: No Fat Layer (Subcutaneous Tissue) Exposed: No Tendon Exposed: No Muscle Exposed: No Joint Exposed: No Bone Exposed: No Periwound Skin Texture Texture Color No Abnormalities Noted: Yes No Abnormalities Noted: Yes Moisture Temperature / Pain No Abnormalities Noted: Yes Temperature: No Abnormality Electronic Signature(s) Signed: 07/18/2022 3:18:09 PM By: [name] Signed: 07/18/2022 4:19:23 PM By: Baruch Gouty RN, BSN Entered By: Baruch Gouty on 07/18/2022 10:40:56 -------------------------------------------------------------------------------- [place] Details Patient Name: Date of Service: Cynthia [name], [name]. 07/18/2022 10:00 A M Medical Record Number: [id number] Patient Account Number: [id number] Date of Birth/Sex: Treating RN: [birth year] (89 y.o. Roohi, Tremper, Aurora C ([id number]) [phone].pdf Page 21 of 21 Primary Care Jamerica Snavely: [name] Other Clinician: Referring Anselma Herbel: Treating Bryana Froemming/Extender: [name] in Treatment: 19 Vital Signs Time Taken: 10:01 Temperature (F): 98.2 Height (in): 66 Pulse (bpm): 95 Weight (lbs): 133 Respiratory Rate (breaths/min): 20 Body Mass Index (BMI): 21.5 Blood Pressure (mmHg): 124/87 Reference Range: 80 - 120 mg / dl Electronic Signature(s) Signed: 07/18/2022 3:18:09 PM By: [name] Entered By: [name] on 07/18/2022 10:02:39

## 2022-07-21 DIAGNOSIS — F411 Generalized anxiety disorder: Secondary | ICD-10-CM | POA: Diagnosis not present

## 2022-07-25 ENCOUNTER — Ambulatory Visit (HOSPITAL_BASED_OUTPATIENT_CLINIC_OR_DEPARTMENT_OTHER): Payer: Medicare PPO | Admitting: General Surgery

## 2022-07-25 DIAGNOSIS — Z0189 Encounter for other specified special examinations: Secondary | ICD-10-CM | POA: Diagnosis not present

## 2022-08-01 DIAGNOSIS — N1832 Chronic kidney disease, stage 3b: Secondary | ICD-10-CM | POA: Diagnosis not present

## 2022-08-01 DIAGNOSIS — L299 Pruritus, unspecified: Secondary | ICD-10-CM | POA: Diagnosis not present

## 2022-08-01 DIAGNOSIS — T148XXA Other injury of unspecified body region, initial encounter: Secondary | ICD-10-CM | POA: Diagnosis not present

## 2022-08-01 DIAGNOSIS — F03C0 Unspecified dementia, severe, without behavioral disturbance, psychotic disturbance, mood disturbance, and anxiety: Secondary | ICD-10-CM | POA: Diagnosis not present

## 2022-08-01 DIAGNOSIS — M7989 Other specified soft tissue disorders: Secondary | ICD-10-CM | POA: Diagnosis not present

## 2022-08-04 DIAGNOSIS — E039 Hypothyroidism, unspecified: Secondary | ICD-10-CM | POA: Diagnosis not present

## 2022-08-04 DIAGNOSIS — F0284 Dementia in other diseases classified elsewhere, unspecified severity, with anxiety: Secondary | ICD-10-CM | POA: Diagnosis not present

## 2022-08-04 DIAGNOSIS — Z515 Encounter for palliative care: Secondary | ICD-10-CM | POA: Diagnosis not present

## 2022-08-04 DIAGNOSIS — N189 Chronic kidney disease, unspecified: Secondary | ICD-10-CM | POA: Diagnosis not present

## 2022-08-04 DIAGNOSIS — F02818 Dementia in other diseases classified elsewhere, unspecified severity, with other behavioral disturbance: Secondary | ICD-10-CM | POA: Diagnosis not present

## 2022-08-04 DIAGNOSIS — I129 Hypertensive chronic kidney disease with stage 1 through stage 4 chronic kidney disease, or unspecified chronic kidney disease: Secondary | ICD-10-CM | POA: Diagnosis not present

## 2022-08-05 ENCOUNTER — Emergency Department (HOSPITAL_COMMUNITY): Payer: Medicare PPO

## 2022-08-05 ENCOUNTER — Emergency Department (HOSPITAL_COMMUNITY)
Admission: EM | Admit: 2022-08-05 | Discharge: 2022-08-16 | Disposition: E | Payer: Medicare PPO | Attending: Student | Admitting: Student

## 2022-08-05 DIAGNOSIS — R0902 Hypoxemia: Secondary | ICD-10-CM | POA: Diagnosis not present

## 2022-08-05 DIAGNOSIS — A419 Sepsis, unspecified organism: Secondary | ICD-10-CM | POA: Diagnosis not present

## 2022-08-05 DIAGNOSIS — I10 Essential (primary) hypertension: Secondary | ICD-10-CM | POA: Diagnosis not present

## 2022-08-05 DIAGNOSIS — R918 Other nonspecific abnormal finding of lung field: Secondary | ICD-10-CM | POA: Diagnosis not present

## 2022-08-05 DIAGNOSIS — K668 Other specified disorders of peritoneum: Secondary | ICD-10-CM | POA: Diagnosis not present

## 2022-08-05 DIAGNOSIS — Z96652 Presence of left artificial knee joint: Secondary | ICD-10-CM | POA: Insufficient documentation

## 2022-08-05 DIAGNOSIS — Z7901 Long term (current) use of anticoagulants: Secondary | ICD-10-CM | POA: Diagnosis not present

## 2022-08-05 DIAGNOSIS — K920 Hematemesis: Secondary | ICD-10-CM | POA: Diagnosis not present

## 2022-08-05 DIAGNOSIS — Z79899 Other long term (current) drug therapy: Secondary | ICD-10-CM | POA: Insufficient documentation

## 2022-08-05 DIAGNOSIS — F0390 Unspecified dementia without behavioral disturbance: Secondary | ICD-10-CM | POA: Diagnosis not present

## 2022-08-05 DIAGNOSIS — I3139 Other pericardial effusion (noninflammatory): Secondary | ICD-10-CM | POA: Insufficient documentation

## 2022-08-05 DIAGNOSIS — R739 Hyperglycemia, unspecified: Secondary | ICD-10-CM | POA: Diagnosis not present

## 2022-08-05 DIAGNOSIS — K5730 Diverticulosis of large intestine without perforation or abscess without bleeding: Secondary | ICD-10-CM | POA: Diagnosis not present

## 2022-08-05 DIAGNOSIS — Z86718 Personal history of other venous thrombosis and embolism: Secondary | ICD-10-CM | POA: Insufficient documentation

## 2022-08-05 DIAGNOSIS — R4182 Altered mental status, unspecified: Secondary | ICD-10-CM | POA: Diagnosis not present

## 2022-08-05 DIAGNOSIS — Z853 Personal history of malignant neoplasm of breast: Secondary | ICD-10-CM | POA: Diagnosis not present

## 2022-08-05 DIAGNOSIS — R58 Hemorrhage, not elsewhere classified: Secondary | ICD-10-CM | POA: Diagnosis not present

## 2022-08-05 DIAGNOSIS — E039 Hypothyroidism, unspecified: Secondary | ICD-10-CM | POA: Insufficient documentation

## 2022-08-05 DIAGNOSIS — J90 Pleural effusion, not elsewhere classified: Secondary | ICD-10-CM | POA: Diagnosis not present

## 2022-08-05 DIAGNOSIS — R000 Tachycardia, unspecified: Secondary | ICD-10-CM | POA: Diagnosis not present

## 2022-08-05 DIAGNOSIS — I959 Hypotension, unspecified: Secondary | ICD-10-CM | POA: Diagnosis not present

## 2022-08-05 DIAGNOSIS — M7989 Other specified soft tissue disorders: Secondary | ICD-10-CM | POA: Diagnosis not present

## 2022-08-05 LAB — COMPREHENSIVE METABOLIC PANEL
ALT: 14 U/L (ref 0–44)
AST: 33 U/L (ref 15–41)
Albumin: 2.2 g/dL — ABNORMAL LOW (ref 3.5–5.0)
Alkaline Phosphatase: 49 U/L (ref 38–126)
Anion gap: 19 — ABNORMAL HIGH (ref 5–15)
BUN: 37 mg/dL — ABNORMAL HIGH (ref 8–23)
CO2: 13 mmol/L — ABNORMAL LOW (ref 22–32)
Calcium: 8.3 mg/dL — ABNORMAL LOW (ref 8.9–10.3)
Chloride: 106 mmol/L (ref 98–111)
Creatinine, Ser: 2.4 mg/dL — ABNORMAL HIGH (ref 0.44–1.00)
GFR, Estimated: 19 mL/min — ABNORMAL LOW (ref >60)
Glucose, Bld: 144 mg/dL — ABNORMAL HIGH (ref 70–99)
Potassium: 5 mmol/L (ref 3.5–5.1)
Sodium: 138 mmol/L (ref 135–145)
Total Bilirubin: 0.6 mg/dL (ref 0.3–1.2)
Total Protein: 5.1 g/dL — ABNORMAL LOW (ref 6.5–8.1)

## 2022-08-05 LAB — CBC WITH DIFFERENTIAL/PLATELET
Abs Immature Granulocytes: 0.04 10*3/uL (ref 0.00–0.07)
Basophils Absolute: 0 10*3/uL (ref 0.0–0.1)
Basophils Relative: 0 %
Eosinophils Absolute: 0 10*3/uL (ref 0.0–0.5)
Eosinophils Relative: 0 %
HCT: 30.9 % — ABNORMAL LOW (ref 36.0–46.0)
Hemoglobin: 8.7 g/dL — ABNORMAL LOW (ref 12.0–15.0)
Immature Granulocytes: 0 %
Lymphocytes Relative: 6 %
Lymphs Abs: 0.8 10*3/uL (ref 0.7–4.0)
MCH: 25.8 pg — ABNORMAL LOW (ref 26.0–34.0)
MCHC: 28.2 g/dL — ABNORMAL LOW (ref 30.0–36.0)
MCV: 91.7 fL (ref 80.0–100.0)
Monocytes Absolute: 0.2 10*3/uL (ref 0.1–1.0)
Monocytes Relative: 1 %
Neutro Abs: 11.6 10*3/uL — ABNORMAL HIGH (ref 1.7–7.7)
Neutrophils Relative %: 93 %
Platelets: 503 10*3/uL — ABNORMAL HIGH (ref 150–400)
RBC: 3.37 MIL/uL — ABNORMAL LOW (ref 3.87–5.11)
RDW: 18.8 % — ABNORMAL HIGH (ref 11.5–15.5)
WBC: 12.6 10*3/uL — ABNORMAL HIGH (ref 4.0–10.5)
nRBC: 0.5 % — ABNORMAL HIGH (ref 0.0–0.2)

## 2022-08-05 LAB — URINALYSIS, W/ REFLEX TO CULTURE (INFECTION SUSPECTED)
Bilirubin Urine: NEGATIVE
Glucose, UA: NEGATIVE mg/dL
Hgb urine dipstick: NEGATIVE
Ketones, ur: 5 mg/dL — AB
Leukocytes,Ua: NEGATIVE
Nitrite: NEGATIVE
Protein, ur: 30 mg/dL — AB
Specific Gravity, Urine: 1.016 (ref 1.005–1.030)
pH: 5 (ref 5.0–8.0)

## 2022-08-05 LAB — LACTIC ACID, PLASMA: Lactic Acid, Venous: >9 mmol/L (ref 0.5–1.9)

## 2022-08-05 LAB — C-REACTIVE PROTEIN: CRP: 14.7 mg/dL — ABNORMAL HIGH (ref <1.0)

## 2022-08-05 LAB — APTT: aPTT: 27 seconds (ref 24–36)

## 2022-08-05 LAB — PROTIME-INR
INR: 1.3 — ABNORMAL HIGH (ref 0.8–1.2)
Prothrombin Time: 16.4 seconds — ABNORMAL HIGH (ref 11.4–15.2)

## 2022-08-05 LAB — SEDIMENTATION RATE: Sed Rate: 41 mm/hr — ABNORMAL HIGH (ref 0–22)

## 2022-08-05 MED ORDER — VANCOMYCIN HCL 1250 MG/250ML IV SOLN
1250.0000 mg | Freq: Once | INTRAVENOUS | Status: DC
  Filled 2022-08-05: qty 250

## 2022-08-05 MED ORDER — PANTOPRAZOLE SODIUM 40 MG IV SOLR: 40.0000 mg | Freq: Once | INTRAVENOUS | Status: DC

## 2022-08-05 MED ORDER — DILTIAZEM HCL 25 MG/5ML IV SOLN
10.0000 mg | Freq: Once | INTRAVENOUS | Status: AC
  Administered 2022-08-05: 10 mg via INTRAVENOUS
  Filled 2022-08-05: qty 5

## 2022-08-05 MED ORDER — VANCOMYCIN VARIABLE DOSE PER UNSTABLE RENAL FUNCTION (PHARMACIST DOSING): Status: DC

## 2022-08-05 MED ORDER — SODIUM CHLORIDE 0.9 % IV SOLN: 250.0000 mL | INTRAVENOUS | Status: DC

## 2022-08-05 MED ORDER — LACTATED RINGERS IV BOLUS
1000.0000 mL | Freq: Once | INTRAVENOUS | Status: AC
  Administered 2022-08-05: 1000 mL via INTRAVENOUS

## 2022-08-05 MED ORDER — SODIUM CHLORIDE 0.9 % IV SOLN
2.0000 g | Freq: Once | INTRAVENOUS | Status: AC
  Administered 2022-08-05: 2 g via INTRAVENOUS

## 2022-08-05 MED ORDER — SODIUM CHLORIDE 0.9 % IV SOLN: 2.0000 g | INTRAVENOUS | Status: DC

## 2022-08-05 MED ORDER — LACTATED RINGERS IV BOLUS: 1000.0000 mL | Freq: Once | INTRAVENOUS | Status: DC

## 2022-08-05 MED ORDER — PHENYLEPHRINE HCL-NACL 20-0.9 MG/250ML-% IV SOLN: 25.0000 ug/min | INTRAVENOUS | Status: DC

## 2022-08-06 LAB — URINE CULTURE

## 2022-08-06 LAB — CULTURE, BLOOD (ROUTINE X 2)

## 2022-08-07 LAB — POC OCCULT BLOOD, ED: Fecal Occult Bld: NEGATIVE

## 2022-08-08 LAB — URINE CULTURE

## 2022-08-08 LAB — CULTURE, BLOOD (ROUTINE X 2): Culture: NO GROWTH

## 2022-08-09 LAB — URINE CULTURE

## 2022-08-10 LAB — CULTURE, BLOOD (ROUTINE X 2)

## 2022-08-16 NOTE — ED Notes (Signed)
Patient transported to CT with primary RN, upon arrival in CT patient vomited coffee ground emesis. Patient was suctioned and CT scan completed. ED Provider made aware of hematemesis during CT scan. Upon return to the room patient vomited again and was suctioned and then became apneic and heart rate decreased. ED Provider and RN at bedside and ED provider pronounced patient at [phone].

## 2022-08-16 NOTE — Progress Notes (Signed)
Pharmacy Antibiotic Note  [name] is a 89 y.o. female admitted on 08/06/2022 presenting from facility for AMS, concern for sepsis.  Pharmacy has been consulted for vancomycin and cefepime dosing.  Plan: Vancomycin 1250 mg IV x 1, then variable dosing d/t unstable renal function Cefepime 2g IV every 24 hours Monitor renal function, Cx and clinical progression to narrow Vancomycin levels as indicated     Temp (24hrs), Avg:98.9 F (37.2 C), Min:98.9 F (37.2 C), Max:98.9 F (37.2 C)  Recent Labs  Lab 08/13/2022 1855  WBC 12.6*  CREATININE 2.40*  LATICACIDVEN >9.0*    CrCl cannot be calculated (Unknown ideal weight.).    Allergies  Allergen Reactions   Cephalexin Hives   Augmentin [Amoxicillin-Pot Clavulanate] Nausea And Vomiting   Doxycycline Nausea And Vomiting   Minocycline Hives and Other (See Comments)    Gave bladder infection    Promethazine     Other reaction(s): confusion    Daylene Posey, PharmD, [facility] Clinical Pharmacist ED Pharmacist Phone # [phone] 08/03/2022 8:48 PM

## 2022-08-16 NOTE — ED Provider Notes (Signed)
[place] [facility]ERGENCY DEPARTMENT AT [facility] Provider Note  CSN: [id number] Arrival date & time: 08-28-22 1842  Chief Complaint(s) Altered Mental Status  HPI [name] is a 89 y.o. female with PMH extensive left lower DVT on Eliquis, breast cancer, iron deficiency anemia, Raynaud's severe dementia currently a DNR who presents emergency department for evaluation of hematemesis and altered mental status.  Patient found unresponsive with bloody vomit near the bed and patient brought to the emergency department for further evaluation.  Here in the emergency room, patient is alert and intermittently responding to questions but will respond with inappropriate speech consistent with her underlying dementia.  Patient arrives with right lower extremity mottling, pallor with rapid A-fib and hypotension.  Additional history unable to obtained secondary to patient's underlying dementia   Past Medical History Past Medical History:  Diagnosis Date   Arthritis    Cancer (HCC) 1988   BREAST CANCER- MASTECTOMY RIGHT - NO CHEMO NO RADIATION   CMV (cytomegalovirus infection) (HCC)    Depression with anxiety    Dizziness    Gout    History of breast cancer    Hypertension    Hypothyroidism    Iron deficiency anemia    Memory loss    Osteoarthritis    Osteopenia    Patella fracture    Peripheral neuropathy    Raynauds phenomenon    Renal disorder    HX OF KIDNEY TRANSPLANT - STATES KIDNEY FUNCTION OK -DR. SANDFORD / DR. FOX -   [facility]   Sinus problem    HX OF SINUS INFECTIONS    Thyroid disease    Vitamin D deficiency    Wolff-Parkinson-White (WPW) syndrome    Patient Active Problem List   Diagnosis Date Noted   Acute encephalopathy 08/17/2021   Altered mental status 08/16/2021   Closed pelvic ring fracture 06/11/2018   Memory loss 06/10/2018   Gait abnormality 06/10/2018   Dementia associated with other underlying disease without behavioral  disturbance 04/12/2018   Major depressive disorder, recurrent, mild 04/12/2018   Pure hypercholesterolemia, unspecified 04/12/2018   Lumbar foraminal stenosis 03/27/2018   Facet arthropathy, lumbar 12/19/2017   Stenosis of lateral recess of lumbar spine 12/19/2017   Syncope 10/03/2017   History of breast cancer 10/03/2017   Back pain 10/03/2017   WPW (Wolff-Parkinson-White syndrome) 10/03/2017   Polyneuropathy associated with underlying disease 06/06/2017   Mild cognitive impairment 04/23/2017   Dizziness 04/23/2017   Expected blood loss anemia 07/23/2013   S/P Right patella ORIF 07/22/2013   HTN (hypertension) 08/29/2012   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 Authorizing Provider  acetaminophen (TYLENOL) 325 MG tablet Take 325 mg by mouth as needed (pain).     [provider]  acidophilus (RISAQUAD) CAPS capsule Take 1 capsule by mouth daily.    [provider]  apixaban (ELIQUIS) 5 MG TABS tablet Take 5 mg by mouth 2 (two) times daily.    [provider]  Calcium Carbonate (CALCIUM 600 PO) Take 1 tablet by mouth every evening.    [provider]  cetirizine (ZYRTEC) 10 MG tablet Take 10 mg by mouth at bedtime.    [provider]  Cholecalciferol (VITAMIN D3) 1.25 MG (50000 UT) CAPS Take 1 capsule by mouth once a week.  [provider]  levothyroxine (SYNTHROID, LEVOTHROID) 50 MCG tablet Take 50 mcg by mouth daily.    [provider]  memantine (NAMENDA) 10 MG tablet Take 1 tablet (10 mg total) by mouth 2 (two) times daily. Please request future refills from PCP. 07/14/19   Levert Feinstein, MD  phenylephrine-shark liver oil-mineral oil-petrolatum (PREPARATION H) 0.25-14-74.9 % rectal  ointment Place 1 application. rectally in the morning, at noon, in the evening, and at bedtime.    [provider]  predniSONE (DELTASONE) 5 MG tablet Take 10 mg by mouth in the morning.    [provider]  tacrolimus (PROGRAF) 1 MG capsule Take 0.5 mg by mouth See admin instructions. Take 0.5mg  bid with 1 mg for a total of 1.5mg  ,  twice daily    [provider]  traMADol (ULTRAM) 50 MG tablet Take 1 tablet by mouth 3 times a day as needed 03/25/21                                                                                                                                       Past Surgical History Past Surgical History:  Procedure Laterality Date   ABDOMINAL HYSTERECTOMY     BREAST SURGERY     RIGHT MASTECTOMY AND AXILLARY NODE DISSECTION   JOINT REPLACEMENT     LEFT TOTAL KNEE REPLACEMENT    KIDNEY TRANSPLANT  march 2012   right side - surgery at [facility]   ORIF PATELLA Right 07/22/2013   Procedure: OPEN REDUCTION INTERNAL (ORIF) FIXATION PATELLA;  Surgeon: Shelda Pal, MD;  Location: [facility];  Service: Orthopedics;  Laterality: Right;   SINUS SURGERY WITH INSTATRAK     Family History Family History  Problem Relation Age of Onset   Other Mother        unsure of history    Dementia Father    Dementia Sister    Cancer Brother        lung    Social History Social History   Tobacco Use   Smoking status: Never   Smokeless tobacco: Never  Vaping Use   Vaping Use: Never used  Substance Use Topics   Alcohol use: Not Currently   Drug use: No   Allergies Cephalexin, Augmentin [amoxicillin-pot clavulanate], Doxycycline, Minocycline, and Promethazine  Review of Systems Review of Systems  Unable to perform ROS: Dementia   *** Physical Exam Vital Signs  I have reviewed the triage vital signs BP (!) 106/59   Pulse (!) 32   Temp 98.9 F (37.2 C) (Rectal)   Resp (!) 34   SpO2 96%  *** Physical Exam Vitals and nursing note  reviewed.  Constitutional:      General: She is in acute distress.     Appearance: She is well-developed. She is ill-appearing and toxic-appearing.  HENT:     Head: Normocephalic and atraumatic.  Eyes:  Conjunctiva/sclera: Conjunctivae normal.  Cardiovascular:     Rate and Rhythm: Tachycardia present. Rhythm irregular.     Heart sounds: No murmur heard. Pulmonary:     Effort: Pulmonary effort is normal. No respiratory distress.     Breath sounds: Normal breath sounds.  Abdominal:     Palpations: Abdomen is soft.     Tenderness: There is abdominal tenderness.  Musculoskeletal:        General: No swelling.     Cervical back: Neck supple.     Comments: Right lower extremity cool to touch, mottled  Skin:    General: Skin is warm and dry.     Capillary Refill: Capillary refill takes less than 2 seconds.  Neurological:     Mental Status: She is alert.  Psychiatric:        Mood and Affect: Mood normal.     ED Results and Treatments Labs (all labs ordered are listed, but only abnormal results are displayed) Labs Reviewed  CBC WITH DIFFERENTIAL/PLATELET - Abnormal; Notable for the following components:      Result Value   WBC 12.6 (*)    RBC 3.37 (*)    Hemoglobin 8.7 (*)    HCT 30.9 (*)    MCH 25.8 (*)    MCHC 28.2 (*)    RDW 18.8 (*)    Platelets 503 (*)    nRBC 0.5 (*)    Neutro Abs 11.6 (*)    All other components within normal limits  PROTIME-INR - Abnormal; Notable for the following components:   Prothrombin Time 16.4 (*)    INR 1.3 (*)    All other components within normal limits  CULTURE, BLOOD (ROUTINE X 2)  CULTURE, BLOOD (ROUTINE X 2)  APTT  LACTIC ACID, PLASMA  LACTIC ACID, PLASMA  COMPREHENSIVE METABOLIC PANEL  URINALYSIS, W/ REFLEX TO CULTURE (INFECTION SUSPECTED)  SEDIMENTATION RATE  C-REACTIVE PROTEIN  POC OCCULT BLOOD, ED                                                                                                                           Radiology DG Foot Complete Right  Result Date: 08-08-22 CLINICAL DATA:  Altered mental status, concern for osteomyelitis. EXAM: RIGHT FOOT COMPLETE - 3+ VIEW COMPARISON:  None Available. FINDINGS: There is no evidence of fracture or dislocation. Plantar and posterior calcaneal enthesophytes are noted. No focal osseous demineralization. There is soft tissue swelling of the heel and foot. IMPRESSION: No radiographic evidence of osteomyelitis. Electronically Signed   By: Romona Curls M.D.   On: 2022/08/08 19:29   DG Chest Port 1 View  Result Date: August 08, 2022 CLINICAL DATA:  Sepsis EXAM: PORTABLE CHEST 1 VIEW COMPARISON:  CXR 03/29/22 FINDINGS: No pleural effusion. No pneumothorax. No focal opacity. Normal cardiac and mediastinal contours. No radiographically apparent displaced rib fractures. Visualized upper abdomen is unremarkable. IMPRESSION: No focal airspace opacity Electronically Signed   By: Lorenza Cambridge M.D.   On: 08/08/22 19:29  Pertinent labs & imaging results that were available during my care of the patient were reviewed by me and considered in my medical decision making (see MDM for details).  Medications Ordered in ED Medications  pantoprazole (PROTONIX) injection 40 mg (has no administration in time range)  lactated ringers bolus 1,000 mL (has no administration in time range)                                                                                                                                     Procedures .Critical Care  Performed by: Glendora Score, MD Authorized by: Glendora Score, MD   Critical care provider statement:    Critical care time (minutes):  104   Critical care was necessary to treat or prevent imminent or life-threatening deterioration of the following conditions:  Cardiac failure, circulatory failure and sepsis   Critical care was time spent personally by me on the following activities:  Development of treatment plan with patient or  surrogate, discussions with consultants, evaluation of patient's response to treatment, examination of patient, ordering and review of laboratory studies, ordering and review of radiographic studies, ordering and performing treatments and interventions, pulse oximetry, re-evaluation of patient's condition and review of old charts .Central Line  Date/Time: 09/02/22 10:26 PM  Performed by: Glendora Score, MD Authorized by: Glendora Score, MD   Consent:    Consent obtained:  Emergent situation Pre-procedure details:    Indication(s): central venous access   Sedation:    Sedation type:  None Anesthesia:    Anesthesia method:  None Procedure details:    Location:  L femoral   Procedural supplies:  Triple lumen   Ultrasound guidance: yes     Ultrasound guidance timing: real time     Number of attempts:  1   Successful placement: yes   Post-procedure details:    Post-procedure:  Line sutured and dressing applied   Assessment:  Blood return through all ports   Procedure completion:  Tolerated well, no immediate complications   (including critical care time)  Medical Decision Making / ED Course   This patient presents to the ED for concern of hematemesis, altered mental status, this involves an extensive number of treatment options, and is a complaint that carries with it a high risk of complications and morbidity.  The differential diagnosis includes esophageal varices, PE, ischemic bowel, bowel perforation, sepsis, toxic encephalopathy, metabolic encephalopathy  MDM: Patient seen emergency room for evaluation of altered mental status and hematemesis.  Physical exam reveals a very ill-appearing patient with a rapid irregular tachycardia, right lower extremity cool to the touch and mottled, large wound over the right dorsum of the foot, generalized abdominal tenderness.  Laboratory evaluation with leukocytosis to 12.6, hemoglobin 8.7 which is a significant drop from 4 months ago at  13.9, creatinine 2.4, BUN 37, CO2 13, sed rate 41, CRP 14.7, lactic acid greater than 9.  Patient fluid resuscitated  aggressively with 2 L lactated Ringer's and broad-spectrum antibiotics initiated.  Chest x-ray and foot x-ray unremarkable.  Peripheral access very difficult given suspected decreased intravascular volume and given patient's critical condition, and emergent left femoral central line was placed.  With blood pressures improving with fluid resuscitation, single dose diltiazem ordered for rapid A-fib with improvement of rates and patient was ultimately stable enough for CAT scan.  While in the CAT scanner, patient had a large episode of hematemesis and subsequent aspiration.  She was rapidly brought back to the room and unfortunately quickly lost pulses.  I quickly spoke with her daughter Jennell Corner who is the patient's medical power of attorney who reiterated her clear medical wishes to remain a DNR/DNI and aggressive resuscitative efforts were ceased.  Time of death 2208/08/27.   Of note, I did receive a call from radiology stating that the patient had a bowel perforation and significant intra abdominal free air   Additional history obtained: -Additional history obtained from patient's husband and daughter -External records from outside source obtained and reviewed including: Chart review including previous notes, labs, imaging, consultation notes   Lab Tests: -I ordered, reviewed, and interpreted labs.   The pertinent results include:   Labs Reviewed  CBC WITH DIFFERENTIAL/PLATELET - Abnormal; Notable for the following components:      Result Value   WBC 12.6 (*)    RBC 3.37 (*)    Hemoglobin 8.7 (*)    HCT 30.9 (*)    MCH 25.8 (*)    MCHC 28.2 (*)    RDW 18.8 (*)    Platelets 503 (*)    nRBC 0.5 (*)    Neutro Abs 11.6 (*)    All other components within normal limits  PROTIME-INR - Abnormal; Notable for the following components:   Prothrombin Time 16.4 (*)    INR 1.3 (*)    All  other components within normal limits  CULTURE, BLOOD (ROUTINE X 2)  CULTURE, BLOOD (ROUTINE X 2)  APTT  LACTIC ACID, PLASMA  LACTIC ACID, PLASMA  COMPREHENSIVE METABOLIC PANEL  URINALYSIS, W/ REFLEX TO CULTURE (INFECTION SUSPECTED)  SEDIMENTATION RATE  C-REACTIVE PROTEIN  POC OCCULT BLOOD, ED      EKG   EKG Interpretation  Date/Time:  Saturday August 05 2022 19:50:08 EDT Ventricular Rate:  142 PR Interval:  85 QRS Duration: 81 QT Interval:  298 QTC Calculation: 458 R Axis:   62 Text Interpretation: afib with RVR Repolarization abnormality, prob rate related Confirmed by Armonie Staten ([id number]) on 08/10/2022 10:32:12 PM         Imaging Studies ordered: I ordered imaging studies including CT head,  chest abdomen pelvis I independently visualized and interpreted imaging. I agree with the radiologist interpretation   Medicines ordered and prescription drug management: Meds ordered this encounter  Medications   pantoprazole (PROTONIX) injection 40 mg   lactated ringers bolus 1,000 mL    -I have reviewed the patients home medicines and have made adjustments as needed  Critical interventions Central line placement, fluid resuscitation, diltiazem  Consultations Obtained: I requested consultation with the ***,  and discussed lab and imaging findings as well as pertinent plan - they recommend: ***   Cardiac Monitoring: The patient was maintained on a cardiac monitor.  I personally viewed and interpreted the cardiac monitored which showed an underlying rhythm of: ***  Social Determinants of Health:  Factors impacting patients care include: ***   Reevaluation: After the interventions noted above, I reevaluated the patient  and found that they have :{resolved/improved/worsened:23923::"improved"}  Co morbidities that complicate the patient evaluation  Past Medical History:  Diagnosis Date   Arthritis    Cancer (HCC) 1988   BREAST CANCER- MASTECTOMY RIGHT - NO CHEMO  NO RADIATION   CMV (cytomegalovirus infection) (HCC)    Depression with anxiety    Dizziness    Gout    History of breast cancer    Hypertension    Hypothyroidism    Iron deficiency anemia    Memory loss    Osteoarthritis    Osteopenia    Patella fracture    Peripheral neuropathy    Raynauds phenomenon    Renal disorder    HX OF KIDNEY TRANSPLANT - STATES KIDNEY FUNCTION OK -DR. SANDFORD / DR. FOX -   [facility]   Sinus problem    HX OF SINUS INFECTIONS    Thyroid disease    Vitamin D deficiency    Wolff-Parkinson-White (WPW) syndrome       Dispostion: I considered admission for this patient, ***     Final Clinical Impression(s) / ED Diagnoses Final diagnoses:  None     @PCDICTATION @

## 2022-08-16 NOTE — ED Notes (Signed)
Patient BP noted to be low, fluids started, patient placed in reverse Trendelenburg.

## 2022-08-16 NOTE — ED Notes (Signed)
Results not crossing over. POC occult negative.

## 2022-08-16 NOTE — ED Triage Notes (Signed)
Pt BIBA from [facility] for period of unresponsiveness. Upon EMS arrival patient was at neurologic baseline of confusion A&Ox1. EMS reports cool skin and mottling, hypotension and poor perfusion.

## 2022-08-16 DEATH — deceased

## 2023-05-04 IMAGING — MR MR HEAD W/O CM
9 of 10 series · 37 of 48 positions shown · non-contrast
Comparison: CT head dated 1 day prior

CLINICAL DATA: Altered mental status

EXAM:
MRI HEAD WITHOUT CONTRAST
TECHNIQUE: Multiplanar, multiecho pulse sequences of the brain and surrounding
structures were obtained without intravenous contrast.

[Series 3: DWI · axial · 3.0mm · 1.09mm/px · z∈[-40,+119]mm · 11 of 108 slices shown (1 of 4)]
[im 1/108]
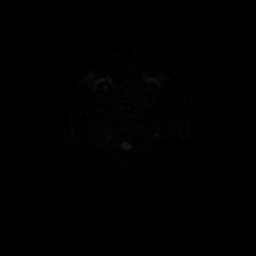
[im 11/108]
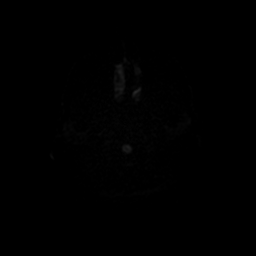
[im 22/108]
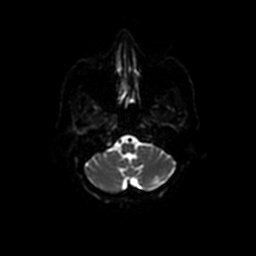
[im 33/108]
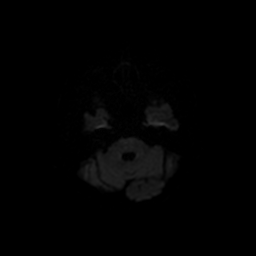
[im 43/108]
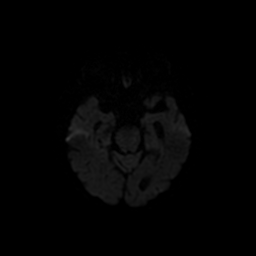
[im 54/108]
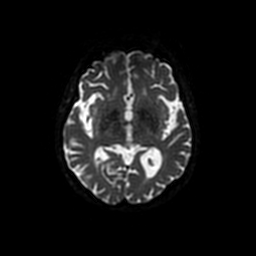
[im 65/108]
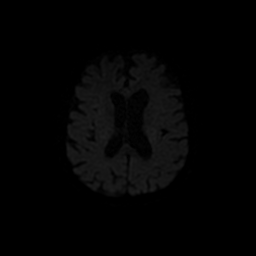
[im 75/108]
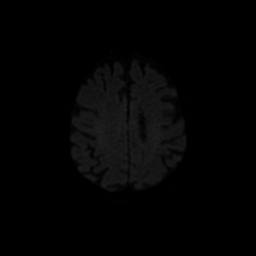
[im 86/108]
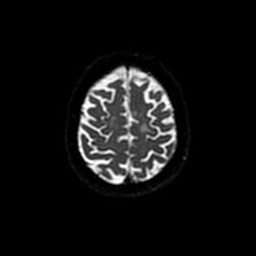
[im 97/108]
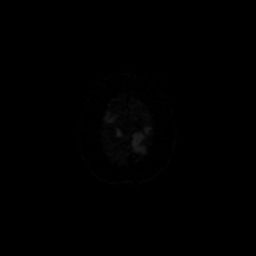
[im 108/108]
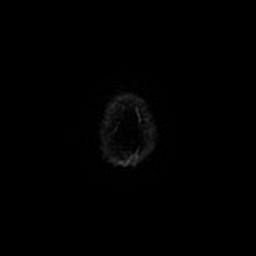

[Series 4: DWI · coronal · 5.0mm · 1.09mm/px · 7 of 72 slices shown (2 of 4)]
[im 1/72]
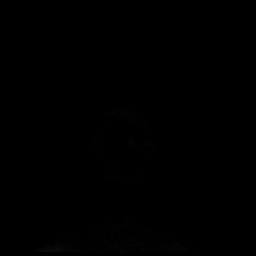
[im 12/72]
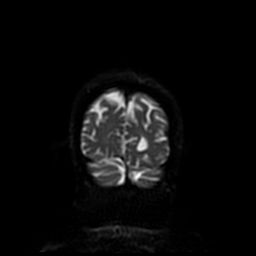
[im 24/72]
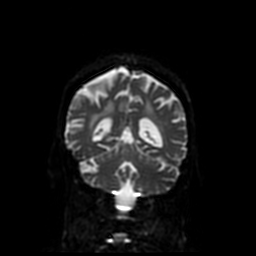
[im 36/72]
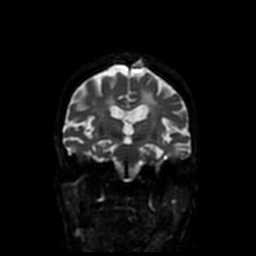
[im 48/72]
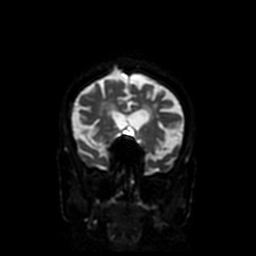
[im 60/72]
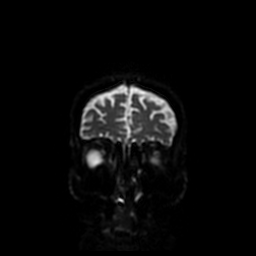
[im 72/72]
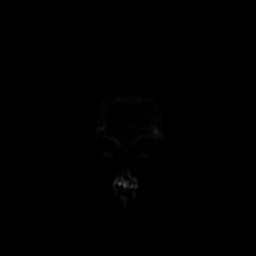

[Series 5: T1 · sagittal · 5.0mm · 0.47mm/px · 2 of 22 slices shown (1 of 2)]
[im 1/22]
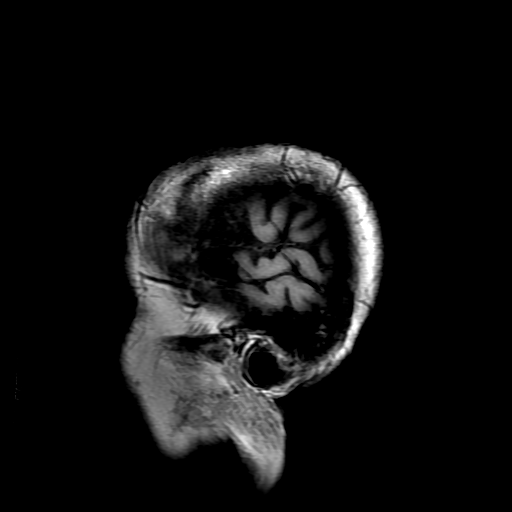
[im 22/22]
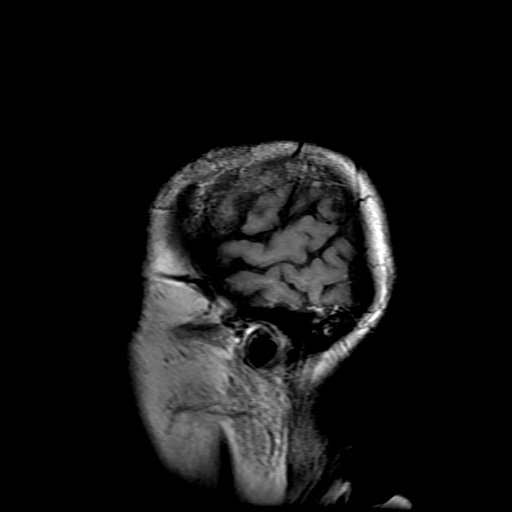

[Series 6: T2 · axial · 5.0mm · 0.43mm/px · z∈[-30,+126]mm · 3 of 27 slices shown (1 of 2)]
[im 1/27]
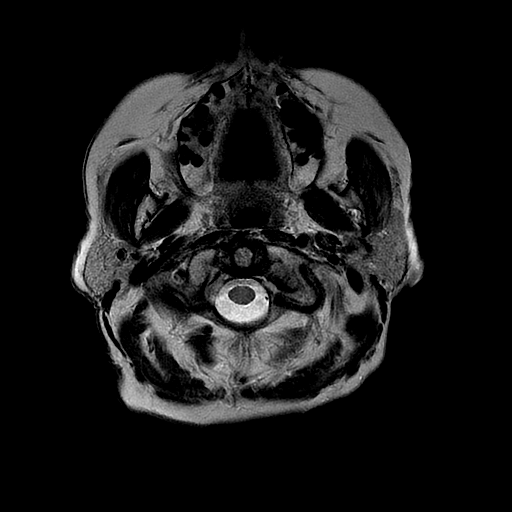
[im 14/27]
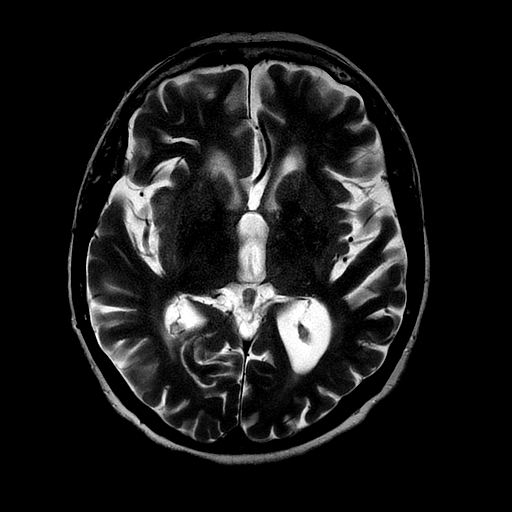
[im 27/27]
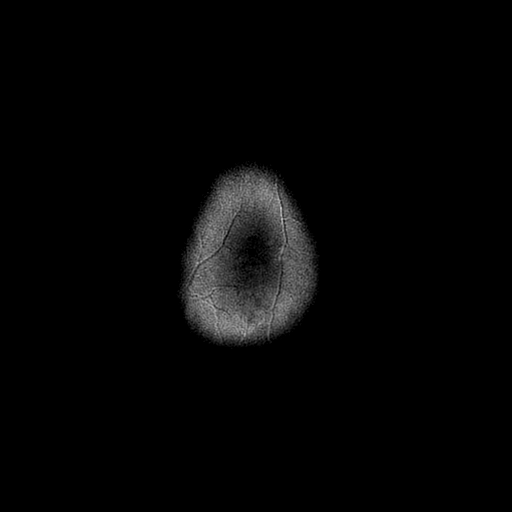

[Series 7: FLAIR · axial · 5.0mm · 0.43mm/px · z∈[-30,+126]mm · 3 of 27 slices shown]
[im 1/27]
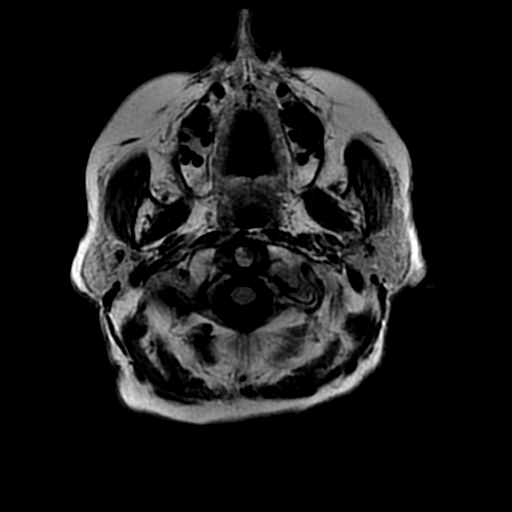
[im 14/27]
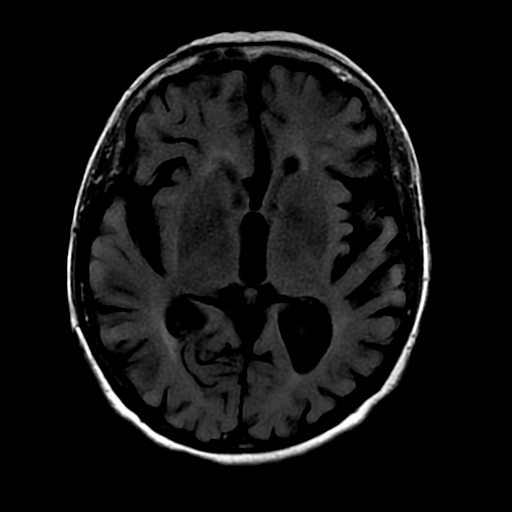
[im 27/27]
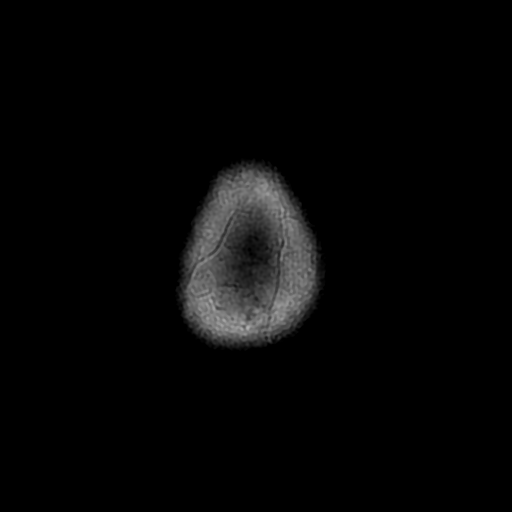

[Series 9: T1 · axial · 3.0mm · 0.43mm/px · 1 of 108 slices shown (2 of 2)]
[im 1/108]
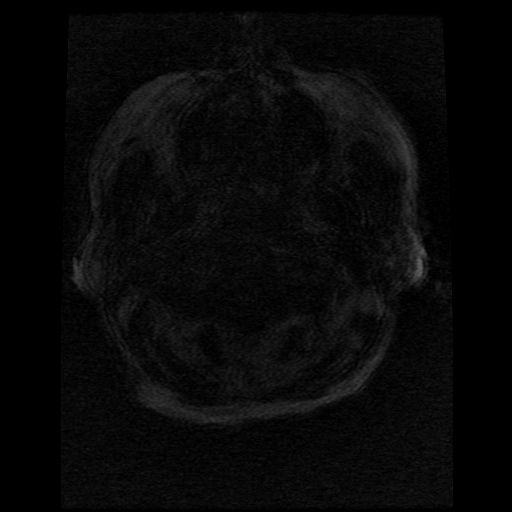

[Series 10: T2 · coronal · 5.0mm · 0.43mm/px · 2 of 24 slices shown (2 of 2)]
[im 1/24]
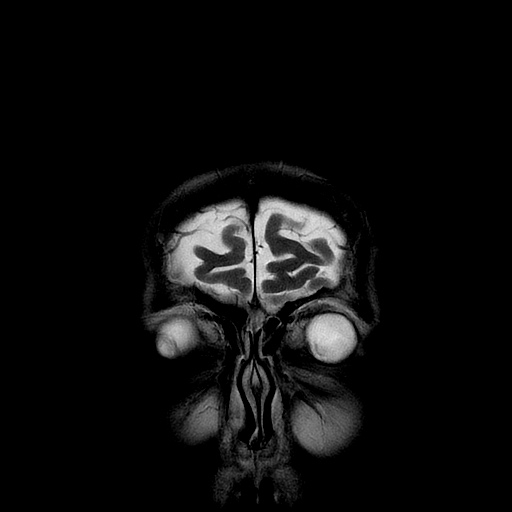
[im 24/24]
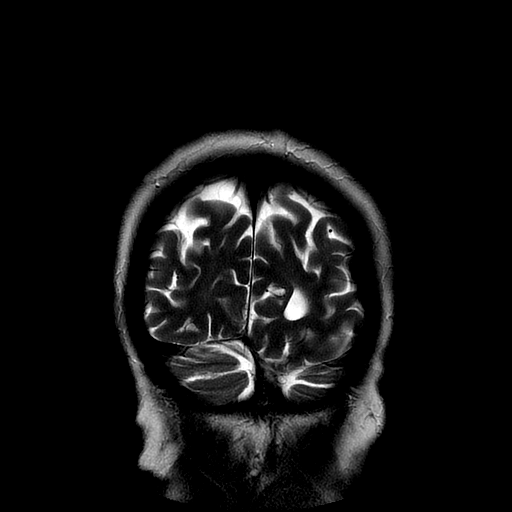

[Series 300: DWI · axial · 3.0mm · 1.09mm/px · z∈[-40,+119]mm · 5 of 54 slices shown (3 of 4)]
[im 1/54]
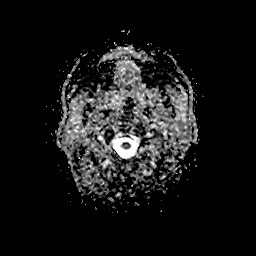
[im 14/54]
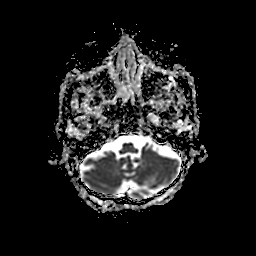
[im 27/54]
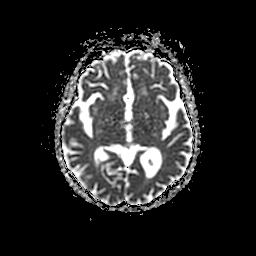
[im 40/54]
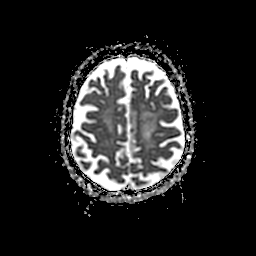
[im 54/54]
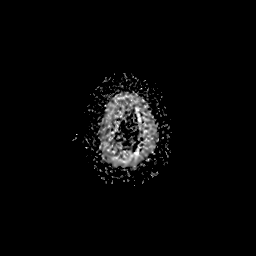

[Series 400: DWI · coronal · 5.0mm · 1.09mm/px · 3 of 36 slices shown (4 of 4)]
[im 1/36]
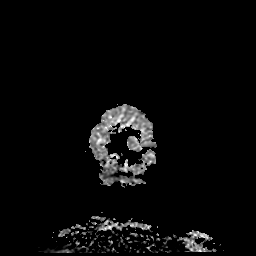
[im 18/36]
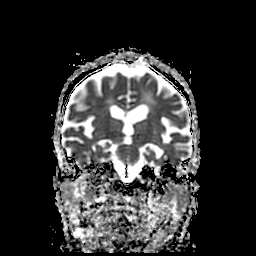
[im 36/36]
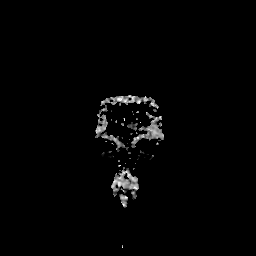

[37 of 48 positions shown; findings below may reference images not displayed]

FINDINGS: Brain: There is no acute intracranial hemorrhage, extra-axial fluid
collection, or acute infarct.

There is mild global parenchymal volume loss with prominence of the
ventricular system and extra-axial CSF spaces, unchanged. The
ventricles are stable in size. Confluent FLAIR signal abnormality
throughout the subcortical and periventricular white matter likely
reflects sequela of moderate chronic white matter microangiopathy.

There is no suspicious parenchymal signal abnormality. There is no
mass lesion. There is no mass effect or midline shift.

Vascular: Normal flow voids.

Skull and upper cervical spine: Normal marrow signal.

Sinuses/Orbits: The paranasal sinuses are clear. Bilateral lens
implants are in place. The globes and orbits are otherwise
unremarkable.

Other: None.
IMPRESSION: 1. No acute intracranial pathology.
2. Unchanged global parenchymal volume loss and moderate chronic
white matter microangiopathy.
